# Patient Record
Sex: Male | Born: 1960 | Race: White | Hispanic: No | Smoking: Current every day smoker
Health system: Southern US, Community
[De-identification: ages and names within clinical notes are randomized; demographics above are authoritative.]

## PROBLEM LIST (undated history)

## (undated) DIAGNOSIS — I639 Cerebral infarction, unspecified: Secondary | ICD-10-CM

## (undated) DIAGNOSIS — Z87442 Personal history of urinary calculi: Secondary | ICD-10-CM

## (undated) DIAGNOSIS — F32A Depression, unspecified: Secondary | ICD-10-CM

## (undated) DIAGNOSIS — I10 Essential (primary) hypertension: Secondary | ICD-10-CM

## (undated) HISTORY — DX: Essential (primary) hypertension: I10

## (undated) HISTORY — DX: Depression, unspecified: F32.A

## (undated) HISTORY — PX: OTHER SURGICAL HISTORY: SHX169

## (undated) HISTORY — PX: APPENDECTOMY: SHX54

## (undated) HISTORY — PX: COLON SURGERY: SHX602

---

## 2004-03-05 ENCOUNTER — Other Ambulatory Visit: Payer: Self-pay

## 2005-10-03 ENCOUNTER — Emergency Department: Payer: Self-pay | Admitting: General Practice

## 2005-10-03 ENCOUNTER — Other Ambulatory Visit: Payer: Self-pay

## 2005-10-11 ENCOUNTER — Ambulatory Visit: Payer: Self-pay | Admitting: Family Medicine

## 2007-01-01 ENCOUNTER — Emergency Department: Payer: Self-pay | Admitting: Internal Medicine

## 2007-01-01 ENCOUNTER — Other Ambulatory Visit: Payer: Self-pay

## 2007-02-09 ENCOUNTER — Other Ambulatory Visit: Payer: Self-pay

## 2007-02-09 ENCOUNTER — Emergency Department: Payer: Self-pay | Admitting: General Practice

## 2007-09-02 ENCOUNTER — Other Ambulatory Visit: Payer: Self-pay

## 2007-09-02 ENCOUNTER — Emergency Department: Payer: Self-pay | Admitting: Emergency Medicine

## 2008-07-01 ENCOUNTER — Inpatient Hospital Stay: Payer: Self-pay | Admitting: Internal Medicine

## 2008-07-04 ENCOUNTER — Emergency Department: Payer: Self-pay | Admitting: Internal Medicine

## 2008-07-08 ENCOUNTER — Encounter: Payer: Self-pay | Admitting: Internal Medicine

## 2008-07-14 ENCOUNTER — Encounter: Payer: Self-pay | Admitting: Internal Medicine

## 2011-09-12 ENCOUNTER — Inpatient Hospital Stay: Payer: Self-pay | Admitting: Student

## 2011-09-12 LAB — SALICYLATE LEVEL: Salicylates, Serum: <1.7 mg/dL

## 2011-09-12 LAB — URINALYSIS, COMPLETE
Blood: NEGATIVE
Glucose,UR: NEGATIVE mg/dL (ref 0–75)
Leukocyte Esterase: NEGATIVE
Nitrite: NEGATIVE
Ph: 6 (ref 4.5–8.0)
Protein: NEGATIVE
Specific Gravity: 1.008 (ref 1.003–1.030)

## 2011-09-12 LAB — COMPREHENSIVE METABOLIC PANEL
Albumin: 3.7 g/dL (ref 3.4–5.0)
Alkaline Phosphatase: 77 U/L (ref 50–136)
Bilirubin,Total: 0.2 mg/dL (ref 0.2–1.0)
Creatinine: 1.14 mg/dL (ref 0.60–1.30)
EGFR (Non-African Amer.): >60
Glucose: 91 mg/dL (ref 65–99)
SGPT (ALT): 33 U/L
Sodium: 139 mmol/L (ref 136–145)
Total Protein: 7.2 g/dL (ref 6.4–8.2)

## 2011-09-12 LAB — CBC
HGB: 13.7 g/dL (ref 13.0–18.0)
MCH: 30.6 pg (ref 26.0–34.0)
MCHC: 32.9 g/dL (ref 32.0–36.0)
MCV: 93 fL (ref 80–100)
Platelet: 423 10*3/uL (ref 150–440)
RBC: 4.49 10*6/uL (ref 4.40–5.90)
RDW: 14.5 % (ref 11.5–14.5)

## 2011-09-12 LAB — DRUG SCREEN, URINE
Amphetamines, Ur Screen: NEGATIVE (ref ?–1000)
Barbiturates, Ur Screen: NEGATIVE (ref ?–200)
Benzodiazepine, Ur Scrn: NEGATIVE (ref ?–200)
Cocaine Metabolite,Ur ~~LOC~~: NEGATIVE (ref ?–300)
MDMA (Ecstasy)Ur Screen: NEGATIVE (ref ?–500)
Phencyclidine (PCP) Ur S: NEGATIVE (ref ?–25)
Tricyclic, Ur Screen: NEGATIVE (ref ?–1000)

## 2011-09-12 LAB — ETHANOL
Ethanol %: <0.003 % (ref 0.000–0.080)
Ethanol: <3 mg/dL

## 2011-09-12 LAB — TROPONIN I: Troponin-I: <0.02 ng/mL

## 2011-09-12 LAB — MAGNESIUM: Magnesium: 2.1 mg/dL

## 2011-09-12 LAB — TSH: Thyroid Stimulating Horm: 1.69 u[IU]/mL

## 2011-09-12 LAB — ACETAMINOPHEN LEVEL: Acetaminophen: 7 ug/mL — ABNORMAL LOW

## 2011-09-13 DIAGNOSIS — I517 Cardiomegaly: Secondary | ICD-10-CM

## 2011-09-13 LAB — CBC WITH DIFFERENTIAL/PLATELET
Basophil #: 0.2 10*3/uL — ABNORMAL HIGH (ref 0.0–0.1)
Basophil %: 2.1 %
Eosinophil #: 0.2 10*3/uL (ref 0.0–0.7)
HCT: 39.2 % — ABNORMAL LOW (ref 40.0–52.0)
MCH: 30.6 pg (ref 26.0–34.0)
MCHC: 33.2 g/dL (ref 32.0–36.0)
MCV: 92 fL (ref 80–100)
Monocyte #: 0.8 10*3/uL — ABNORMAL HIGH (ref 0.0–0.7)
Platelet: 400 10*3/uL (ref 150–440)
RDW: 14.8 % — ABNORMAL HIGH (ref 11.5–14.5)
WBC: 11.8 10*3/uL — ABNORMAL HIGH (ref 3.8–10.6)

## 2011-09-13 LAB — BASIC METABOLIC PANEL
BUN: 10 mg/dL (ref 7–18)
Chloride: 108 mmol/L — ABNORMAL HIGH (ref 98–107)
Co2: 23 mmol/L (ref 21–32)
Creatinine: 1.05 mg/dL (ref 0.60–1.30)
Glucose: 92 mg/dL (ref 65–99)
Osmolality: 284 (ref 275–301)
Potassium: 4.3 mmol/L (ref 3.5–5.1)

## 2011-09-13 LAB — CK TOTAL AND CKMB (NOT AT ARMC)
CK, Total: 35 U/L (ref 35–232)
CK-MB: <0.5 ng/mL — ABNORMAL LOW (ref 0.5–3.6)

## 2011-09-13 LAB — LIPID PANEL
HDL Cholesterol: 44 mg/dL (ref 40–60)
Ldl Cholesterol, Calc: 62 mg/dL (ref 0–100)
VLDL Cholesterol, Calc: 44 mg/dL — ABNORMAL HIGH (ref 5–40)

## 2011-09-13 LAB — TROPONIN I: Troponin-I: <0.02 ng/mL

## 2011-09-14 LAB — TSH: Thyroid Stimulating Horm: 0.572 u[IU]/mL

## 2011-09-18 LAB — CULTURE, BLOOD (SINGLE)

## 2011-09-19 LAB — CBC WITH DIFFERENTIAL/PLATELET
Eosinophil #: 0.1 10*3/uL (ref 0.0–0.7)
HCT: 42.9 % (ref 40.0–52.0)
Lymphocyte %: 28.3 %
MCH: 30.7 pg (ref 26.0–34.0)
MCHC: 32.7 g/dL (ref 32.0–36.0)
MCV: 94 fL (ref 80–100)
Monocyte %: 9.5 %
Neutrophil #: 5.8 10*3/uL (ref 1.4–6.5)
Neutrophil %: 60.1 %
Platelet: 516 10*3/uL — ABNORMAL HIGH (ref 150–440)
RDW: 15.4 % — ABNORMAL HIGH (ref 11.5–14.5)
WBC: 9.6 10*3/uL (ref 3.8–10.6)

## 2011-09-19 LAB — BASIC METABOLIC PANEL
BUN: 16 mg/dL (ref 7–18)
Calcium, Total: 9 mg/dL (ref 8.5–10.1)
Creatinine: 1.26 mg/dL (ref 0.60–1.30)
EGFR (African American): >60
EGFR (Non-African Amer.): >60
Glucose: 94 mg/dL (ref 65–99)
Osmolality: 280 (ref 275–301)
Potassium: 3.9 mmol/L (ref 3.5–5.1)

## 2011-09-21 ENCOUNTER — Emergency Department: Payer: Self-pay | Admitting: Emergency Medicine

## 2011-09-21 LAB — DRUG SCREEN, URINE
Amphetamines, Ur Screen: NEGATIVE (ref ?–1000)
Benzodiazepine, Ur Scrn: NEGATIVE (ref ?–200)
Cannabinoid 50 Ng, Ur ~~LOC~~: NEGATIVE (ref ?–50)
Methadone, Ur Screen: NEGATIVE (ref ?–300)
Opiate, Ur Screen: NEGATIVE (ref ?–300)
Phencyclidine (PCP) Ur S: NEGATIVE (ref ?–25)

## 2011-09-21 LAB — CBC
HCT: 44.2 % (ref 40.0–52.0)
MCHC: 33.1 g/dL (ref 32.0–36.0)
MCV: 93 fL (ref 80–100)
Platelet: 514 10*3/uL — ABNORMAL HIGH (ref 150–440)
RBC: 4.77 10*6/uL (ref 4.40–5.90)
RDW: 15.1 % — ABNORMAL HIGH (ref 11.5–14.5)
WBC: 9.1 10*3/uL (ref 3.8–10.6)

## 2011-09-21 LAB — PROTIME-INR
INR: 0.8
Prothrombin Time: 11.4 secs — ABNORMAL LOW (ref 11.5–14.7)

## 2011-09-21 LAB — COMPREHENSIVE METABOLIC PANEL
Albumin: 3.9 g/dL (ref 3.4–5.0)
BUN: 13 mg/dL (ref 7–18)
Calcium, Total: 9.1 mg/dL (ref 8.5–10.1)
Chloride: 106 mmol/L (ref 98–107)
EGFR (African American): >60
Glucose: 95 mg/dL (ref 65–99)
SGOT(AST): 20 U/L (ref 15–37)
SGPT (ALT): 24 U/L
Total Protein: 7.2 g/dL (ref 6.4–8.2)

## 2011-09-21 LAB — APTT: Activated PTT: 28.5 secs (ref 23.6–35.9)

## 2011-09-21 LAB — TSH: Thyroid Stimulating Horm: 0.89 u[IU]/mL

## 2011-09-21 LAB — SALICYLATE LEVEL: Salicylates, Serum: <1.7 mg/dL

## 2011-09-21 LAB — ETHANOL
Ethanol %: <0.003 % (ref 0.000–0.080)
Ethanol: <3 mg/dL

## 2011-09-21 LAB — ACETAMINOPHEN LEVEL: Acetaminophen: <2 ug/mL

## 2013-01-22 ENCOUNTER — Ambulatory Visit: Payer: Self-pay | Admitting: Neurology

## 2013-02-19 ENCOUNTER — Encounter: Payer: Self-pay | Admitting: Neurology

## 2013-03-14 ENCOUNTER — Encounter: Payer: Self-pay | Admitting: Neurology

## 2013-04-14 ENCOUNTER — Encounter: Payer: Self-pay | Admitting: Neurology

## 2013-05-14 ENCOUNTER — Encounter: Payer: Self-pay | Admitting: Neurology

## 2013-06-14 ENCOUNTER — Encounter: Payer: Self-pay | Admitting: Neurology

## 2013-06-26 ENCOUNTER — Ambulatory Visit: Payer: Self-pay | Admitting: Otolaryngology

## 2013-07-14 ENCOUNTER — Encounter: Payer: Self-pay | Admitting: Neurology

## 2013-08-14 ENCOUNTER — Encounter: Payer: Self-pay | Admitting: Neurology

## 2014-11-03 DIAGNOSIS — I10 Essential (primary) hypertension: Secondary | ICD-10-CM | POA: Diagnosis not present

## 2014-11-03 DIAGNOSIS — I639 Cerebral infarction, unspecified: Secondary | ICD-10-CM | POA: Diagnosis not present

## 2014-11-03 DIAGNOSIS — E785 Hyperlipidemia, unspecified: Secondary | ICD-10-CM | POA: Diagnosis not present

## 2014-11-03 DIAGNOSIS — F341 Dysthymic disorder: Secondary | ICD-10-CM | POA: Diagnosis not present

## 2014-11-03 DIAGNOSIS — E119 Type 2 diabetes mellitus without complications: Secondary | ICD-10-CM | POA: Diagnosis not present

## 2014-11-03 LAB — PSA

## 2014-12-06 NOTE — Consult Note (Signed)
PATIENT NAME:  [name] [name] DXENG, KUCHERATE OF BIRTH:  [birth year]  DATE OF CONSULTATION:  09/13/2011  REFERRING PHYSICIAN:   Dr. Cherylann Ratel CONSULTING PHYSICIAN:  Ollen Gross. Willeen Cass, MD  REASON FOR CONSULTATION: Thyroid nodule.   HISTORY OF PRESENT ILLNESS: This 54 year old male was admitted to the hospital with acute mental status change. He has a history of hypertension, substance abuse, and prior stroke. The patient is unable to give any history. It is unclear whether he had any known history of thyroid disease. It is unclear whether he has had any issues of difficulty swallowing. A thyroid nodule was discovered as an incidental finding during a carotid evaluation. He was noted to have a 2-cm mass in the left lobe of the thyroid gland.   PAST MEDICAL HISTORY:  1. Hypertension.  2. Hospitalization in 2009 for syncope, at which time he was found to have a cerebrovascular accident. Apparently there was some question as to whether these findings could have represented demyelinating disease versus vasculitis. However, neurology felt it was likely prior ischemia.  3. History of polysubstance abuse, history of cocaine abuse.  4. History of bowel obstruction.   ALLERGIES: Codeine.   SOCIAL HISTORY: Unobtainable from the patient.   REVIEW OF SYSTEMS: Unobtainable from the patient. He remains confused.   FAMILY HISTORY: Unobtainable from the patient.  MEDICATIONS:  1. Aspirin 325 mg p.o. daily.  2. Lovenox 40 mg subcutaneous daily.  3. Labetalol 10 mg IV push p.r.n. hypertension.  4. Zofran 4 mg IV push every four hours p.r.n. nausea. 5. Levaquin 750 mg IV q. 24 hours.   PHYSICAL EXAMINATION:  VITAL SIGNS: Temperature 98.7, pulse 63, blood pressure 156/107.   GENERAL: Thin male, clearly confused, unable to answer questions clearly, lying in bed, although he is in no acute distress.   HEAD AND FACE: Head is normocephalic, atraumatic. No facial skin lesions. Facial strength appears to  be normal and symmetric.   EARS: External ears, ear canals, tympanic membranes are clear bilaterally. There is no middle ear effusion or infection.   NOSE: External nose unremarkable. Nasal cavity is clear. No purulence or polyps are seen. The septum is straight.   ORAL CAVITY AND OROPHARYNX: Teeth, lips, and gums unremarkable. Tongue and floor of the mouth without lesions. Posterior pharynx is clear.   NECK: The neck is supple without adenopathy or mass. Salivary glands are soft and nontender. I cannot appreciate the thyroid mass on exam. There may be some fullness in the left lobe of the thyroid gland but I cannot feel a discrete mass.   DATA REVIEW: TSH level was normal at 1.69.   ASSESSMENT: This patient has been admitted to the hospital for acute mental status changes and is in the process of being worked up for that more acute issue. A 2-cm thyroid mass was found as an incidental finding during his carotid evaluation.  It is unclear if he has had any previous evaluation for this mass by history as the patient himself is not able to give any history. Clearly his current medical issues are more important than the evaluation of the thyroid nodule. The evaluation of the thyroid nodule could be followed up on as an outpatient. Right now they have him on anticoagulant therapy which would preclude the next diagnostic evaluation, a fine needle aspirate of the thyroid nodule. I am happy to see this patient in followup. If he is not going to be sent home on anticoagulant therapy, it would be very helpful  if Dr. Cherylann RatelLateef could schedule the patient for an ultrasound-guided fine needle aspiration of the left thyroid nodule as an outpatient after his discharge when he has been off of anticoagulant therapy. He would need to be off of aspirin for a week for the radiologist to schedule this. Otherwise, he can follow up with me as an outpatient after discharge and we could set this up. No other intervention is  necessary at this moment, however. I will sign off and expect to see him as an outpatient.    ____________________________ Ollen GrossPaul S. Willeen CassBennett, MD psb:bjt D: 09/13/2011 17:49:15 ET T: 09/14/2011 08:19:28 ET JOB#: [id number]  cc: Ollen GrossPaul S. Willeen CassBennett, MD, <Dictator> Sandi MealyPAUL S Ladashia Demarinis MD ELECTRONICALLY SIGNED 09/27/2011 7:26

## 2014-12-06 NOTE — Consult Note (Signed)
PATIENT NAME:  [name] [name], [name] [name] MR#:  [id number] DATE OF BIRTH:  Nov 14, 1960  DATE OF CONSULTATION:  09/13/2011  REFERRING PHYSICIAN:  Dr. Cherylann RatelLateef  CONSULTING PHYSICIAN:  Rose PhiPeter R. Kemper Durielarke, MD  HISTORY: Mr. [name] [name] is a 54 year old right-handed white patient of Dr. Vonita MossMark Crissman with history of hypertension, polysubstance abuse, admission February 2007 for seizure in the setting of cocaine use and no hypertensive medication for three days, and admission November 2009 with syncope and small right ventricular nucleus lacunar stroke in the setting of cocaine use and not taking hypertensive medication. He is now admitted 09/12/2011 and is referred for evaluation of altered mental status. History comes from the patient, his hospital chart, and his hospital records.   The patient was brought to the Emergency Room at 5:30 p.m. on 09/12/2011 with history obtained from the admitting physician and from the patient's mother that he was fine until late afternoon when he returned from being out half an hour, he was confused and staggering. Initial vital signs in the Emergency Room included blood pressure 178/118, heart rate 92, respirations 26, oxygen saturation 98%. Urine drug screen was positive for cannabinoids. Brain CT scan showed no acute changes. Brain MRI scan today shows acute nonhemorrhagic stroke of left frontoparietal region. Admitting physician learned by telephone from the patient's mother that the patient had not been taking his medication for hypertension recently.   PHYSICAL EXAMINATION: The patient is a well developed, slender white gentleman who is examined lying semisupine, in no apparent distress. Blood pressure 175/110, heart rate 80; there is no fever. He was normocephalic without evidence of trauma. His neck was supple. He was alert and visually attending with conjugate gaze to each side, without spontaneous speech and with moderate to severe expressive aphasia and more than moderate receptive  difficulty following commands. He was not able to repeat words or name objects. He was occasionally able to utter partial phrase. Cranial nerve examination was grossly normal with symmetric facial appearance, normal eye movements, and visual acuity grossly intact to visual threat for each eye. Motor examination of the extremities was limited but was grossly normal. He had no abnormal spontaneous movements and no gross dystaxia of observed upper extremity movements. Reflexes were bilaterally 2+ upper extremities and 3+ lower extremities. His gait was not tested.   IMPRESSION: Expressive and receptive aphasia associated with MRI scan evidence of new area of nonhemorrhagic infarction of the left hemisphere, in the setting of significant elevation of blood pressure in a patient with history of poor compliance with antihypertensive medication regimen.   RECOMMENDATIONS:  1. I agree with his present work-up and treatment in hospital including imaging and laboratory studies and therapist referrals.  2. The patient is strongly encouraged to begin taking medications faithfully as prescribed.   I appreciate being asked to see this interesting gentleman.    ____________________________ Rose PhiPeter R. Kemper Durielarke, MD prc:drc [name]: 09/14/2011 09:05:00 ET T: 09/14/2011 10:08:12 ET JOB#: [id number]  cc: Rose PhiPeter R. Kemper Durielarke, MD, <Dictator> Steele SizerMark A. Crissman, MD Gaspar GarbePETER R Garhett Bernhard MD ELECTRONICALLY SIGNED 09/15/2011 14:41
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
## 2014-12-06 NOTE — Discharge Summary (Signed)
PATIENT NAME:  [name] [name], [name] [name] MR#:  [id number] DATE OF BIRTH:  Apr 04, 1961  DATE OF ADMISSION:  09/12/2011 DATE OF DISCHARGE:  09/19/2011  CHIEF COMPLAINT: Altered mental status, confusion.   CONSULTANTS:  1. Physical Therapy. 2. Occupational Therapy.  3. Suzan SlickPeter Clarke, MD - Neurology. 4. Mordecai RasmussenJohn Clapacs, MD - Psychiatry.  5. Geanie LoganPaul Bennett, MD - ENT.  DISCHARGE DIAGNOSES:  1. Stroke with receptive expressive aphasia. 2. Uncontrolled hypertension. 3. History of polysubstance abuse. 4. Medication noncompliance.  5. Pneumonia. 6. Left thyroid mass. 7. Post-stroke delirium.   DISCHARGE MEDICATIONS:  1. Lisinopril 20 mg daily.  2. Metoprolol 50 mg twice a day. 3. Aspirin 325 mg daily until you fill Aggrenox. At that time, please stop aspirin.  4. Aggrenox 1 tab p.o. twice a day.  DIET: Low sodium, low fat diet.   ACTIVITY: As tolerated.   REFERRALS: Outpatient occupational therapy, speech therapy.   DISCHARGE FOLLOWUP: Please follow-up with your PCP within 1 to 2 weeks for blood pressure check and further evaluation for the left-sided thyroid mass. Please follow-up with Dr. Willeen CassBennett, ENT, for left-sided thyroid mass evaluation and possibly biopsy scheduling.   HISTORY OF PRESENT ILLNESS: Please see the full history and physical dictated on 09/12/2011 by Dr. Cherylann RatelLateef, but briefly this is a 54 year old Caucasian male with history of CVA in the past and history of polysubstance abuse who presented with altered mental status and was found to have elevated blood pressure and urine toxicology positive for THC. He was admitted to the hospitalist service for further evaluation and management. The patient had been noncompliant with his medications.   SIGNIFICANT LABS/IMAGING: Creatinine on arrival was 1.14 and sodium 139. Urine toxicology was positive for cannabis. LDL 62. LFTs within normal limits. Troponins negative x3. TSH 1.69 initially then 0.572. Free thyroxine 0.98. Initial WBC 10.5.    Blood cultures on arrival: No growth to date x2.   Urinalysis not suggestive of infection.   Initial pH on ABG was 7.39, pCO2 37, and pO2 77.   Echocardiogram showed no thrombus or vegetation noted. Ejection fraction is greater than or equal to 55%. Possible diastolic dysfunction. Mild concentric left ventricular hypertrophy.   Initial CT of the head without contrast showed findings suggestive of multifocal areas of previous infarct.  X-ray of the chest, one view, showed bibasilar atelectasis versus pneumonia.   MRI of the brain without contrast showed findings concerning for embolic disease within the right MCA distribution. Chronic areas of lacunar infarct in the right cerebellar hemisphere.   Ultrasound of the carotids showed no significant stenosis.  Ultrasound of thyroid showed lower pole of thyroid with solid mass without definitive evidence of malignancy. Punctate calcification within the right mid right thyroid lobe.   HOSPITAL COURSE: The patient was admitted to the hospitalist service. Further evaluation including MRI of the brain was done and the patient was also seen by Dr. Kemper Durielarke from Neurology. The MRI did show evidence for an acute stroke. The patient did have expressive and receptive aphasia. The patient is noncompliant with his medications and on arrival had elevated blood pressure. He was started on aspirin, beta blocker, and his lisinopril was continued. LDL came back within goal and he has not been started on a statin. He was seen by physical therapy and occupational therapy. The hospitalization was complicated with post-stroke delirium and he was seen by psychiatry, Dr. Toni Amendlapacs. He was started on p.r.n. Haldol, Ativan, and Geodon. Urine toxicology came back positive for cannabinoids. The patient has history of  polysubstance abuse including tobacco, alcohol, and cocaine as well. He is ambulating and his deficits are aphasia, mostly. Per physical therapy, he can go home with  outpatient OT and speech therapy. This will be scheduled for the patient. While here he was started on aspirin, however, he will go with aspirin as well as Aggrenox prescription. The family states that he cannot afford Aggrenox, however, I will provide a prescription and once his insurance kicks in they can fill this and until then they can take the full dose aspirin. His blood pressure has significantly improved with the ACE inhibitor and metoprolol.   While hospitalized he did have an x-ray of the chest which was suggestive of atelectasis versus pneumonia. He was started on Levaquin and he has had seven days of this. He had no significant leukocytosis, hypoxia, or fevers. While undergoing evaluation for his stroke, he did have an ultrasound of the carotids which did not show any evidence for significant stenosis. However, a left thyroid mass was seen, which was an incidental finding.  A left side thyroid dedicated ultrasound was obtained which did pick up the mass as well and ENT physician, Dr. Willeen Cass, was consulted who saw the patient and is happy to see the patient as an outpatient for possible biopsy. I discussed this with the patient's family and they will see him as an outpatient. At this point, outpatient services have been set up for the patient and he will be going home with that.   TOTAL TIME SPENT: 35 minutes. ____________________________ Krystal Eaton, MD sa:slb [name]: 09/19/2011 14:11:17 ET    T: 09/19/2011 14:44:20 ET       JOB#: [id number]  cc: Krystal Eaton, MD, <Dictator> Steele Sizer, MD Krystal Eaton MD ELECTRONICALLY SIGNED 10/06/2011 15:25

## 2014-12-06 NOTE — Consult Note (Signed)
Details:    - Psychiatry: Rounded on pt today. He was awake and alert. Mother was in the room. PAtient interacted with me but his aphasia today was similar to when I last saw him. He could not speak his name, could not name simple objects. Given a pen he fumbled with it for a while but finally dis write some numbers on a piece of paper but no words. Seemed to understand the question when I asked him to name his mother, but called her "daddy" several times. I understand that yesterday he was better had was producing some inteligenble speach. Today mother  tells me he has not been able to speak the whole time she has been there. She confirms also that patient does not drink alcohol and has no prior history of mental illness.  Aphasia Post-stroke delirium  No new psychiatry treatment. Continue use of prn im geodon or iv haldol for delirium if needed.   Electronic Signatures: Audery Amellapacs, Sherald Balbuena T (MD)  (Signed 03-Feb-13 18:23)  Authored: Details   Last Updated: 03-Feb-13 18:23 by Audery Amellapacs, Curran Lenderman T (MD)

## 2014-12-06 NOTE — Consult Note (Signed)
Details:    - Psychiatry: Patient seen today.He was awake in bed and calm. Interected much as yesterday. In my brief encounter today I could not detect much change from yesterday in his aphasia.  His delirium seems to have settled down with great improvement in behavior. You could probably safely discontinue the sitter if you wish, but I will leave that to the discretion of the ward physician since I do not know what the 24hr nursing opinion is.   Electronic Signatures: Audery Amellapacs, Candler Ginsberg T (MD)  (Signed 04-Feb-13 13:02)  Authored: Details   Last Updated: 04-Feb-13 13:02 by Audery Amellapacs, Timmothy Baranowski T (MD)

## 2014-12-06 NOTE — Consult Note (Signed)
Details:    - Psychiatry: Patient reevaluated. [facility] PAtient has had nno new problems. He is alert and more active. He has been able to be up and mobile but has not been aggressive or agitated. Got information from family that patient had been sent home after stroke rather than to rehab. He had some disorganized behavior but not aggressive or dangerous.   Patient does not have a psychiatric problem. He has possibly some behavior problems due to his acute neurologic problem. Patient does not need ongoing psychiaric medication.He has not needed any antipsychotic or psych meds in the ER.   Family feels they can't manage him at home.  He needs to go to a rehab facility.  I suggest the ER continue to try to work with hospital resourses to get him the services he needs.   Electronic Signatures: Audery Amellapacs, Sybrina Laning T (MD)  (Signed 08-Feb-13 15:43)  Authored: Details   Last Updated: 08-Feb-13 15:43 by Audery Amellapacs, Ramiro Pangilinan T (MD)

## 2014-12-06 NOTE — Consult Note (Signed)
PATIENT NAME:  [name] [name], [name] [name] MR#:  [id number] DATE OF BIRTH:  February 16, 1961  DATE OF CONSULTATION:  09/14/2011  REFERRING PHYSICIAN:   CONSULTING PHYSICIAN:  Audery AmelJohn T. Cheston Coury, MD  IDENTIFYING INFORMATION AND REASON FOR CONSULT: This is a 54 year old gentleman who came into the Emergency Room with an acute mental status change. Consult was for mental status change and possibility of substance abuse because he has a drug screen positive for marijuana.   HISTORY OF PRESENT ILLNESS: Information was obtained from the chart and some information from an interview with the patient. The patient was evidently brought into the hospital yesterday with a history of an acute dramatic mental status change. His mother reported that he was away from her for a short period of time and when she next saw him he was unable to speak or communicate. The mother allegedly had reported that she did not know of any substance abuse that he had recently been engaged in. The patient was worked up and an MRI shows a new area of infarct. He has been seen by the neurology consultant who has identified this as well. I tried to interview the patient and was not able to actually get any history. The patient has a rather severe aphasia, certainly expressive, probably receptive. He is unable to communicate in any really meaningful manner. The patient was in fact unable to tell me his name. He was unable to name any of the simple objects that I showed him. He was able to say the word yes but he did not use it consistently in a correct manner. I am not sure if this was because he was not understanding the question or is not able to correctly articulate yes or no or if there is a greater underlying dementia. He was not able to follow simple directions. I tried to get him to communicate using a yes or no hand code but he was not available to understand what I wanted. He does retain the use of a few words. He is able to say the words yes and no. He  said "okay bud" to me several times after I had finished speaking to him. When I waved to him and left he said thanks. Otherwise, unable to give any history.   PAST PSYCHIATRIC HISTORY: Apparently he has had a history of substance abuse problems in the past.   REVIEW OF SYSTEMS: The patient is not able to offer any complaints at this time.   MENTAL STATUS EXAM: See above. He was alert and awake and interactive with me, although not in a way that was communicating. He made fairly good eye contact at times. He was not agitated or hostile. His affect looked like he was puzzled and troubled at not being able to speak, although he did not get angry or agitated. He was not tearful. He was not able to answer any historical questions even with a consistent yes or no. As an example, I asked him initially whether 2+2 equaled 4 and he answered no. I asked him if his name was [name] and he answered no. I asked him about his birth date and he answered no. Later I asked him the same questions and he answered yes to one of them and still no to the others. It was very inconsistent. I asked him to show me how he used a drinking cup and he was able to do that correctly. I handed him the remote control to the television. He pointed it towards  the TV and seemed to know what it was supposed to do but he was unable to actually find the right button or make it do anything.   LABORATORY, DIAGNOSTIC, AND RADIOLOGICAL DATA: See the MRI scan results for the description of the new findings. His drug screen was positive for cannabis, otherwise negative.   ASSESSMENT: This is a 54 year old man who presents with an acute mental status change or more specifically an acute aphasia. Apparently he was more sedated and confused yesterday, but today he is awake. As detailed above he has a very significant aphasia and is unable in the brief time I spent with him to communicate at all. This is seen to be clearly related to his new stroke.  Cannabis is not likely to have played a significant role in this. His drug screen isn't positive for amphetamines or cocaine, either of which could have contributed to his stroke. Therefore, I do not think that his substance abuse is an acutely relevant issue. In any case, he would certainly not be able to participate in any kind of treatment for substance abuse at this point.   TREATMENT PLAN: No psychiatric treatment indicated. I will follow up with him tomorrow to make sure things are about the same. If there is any new concern that I have somehow overlooked, please let me know. Otherwise I will probably sign off.   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   SECONDARY DIAGNOSES:  AXIS I: Marijuana abuse.   AXIS II: No diagnosis.   AXIS III: Acute stroke.   AXIS IV: Severe from this current new severe disability.   AXIS V: Functioning at time of evaluation 30.   ____________________________ Audery Amel, MD [id number] [name]: 09/14/2011 16:36:37 ET T: 09/15/2011 11:22:07 ET JOB#: [id number]  cc: Audery Amel, MD, <Dictator> Audery Amel MD ELECTRONICALLY SIGNED 09/15/2011 15:32

## 2014-12-06 NOTE — H&P (Signed)
PATIENT NAME:  [name] [name], [name] [name] MR#:  [id number] DATE OF BIRTH:  [birth year]  DATE OF ADMISSION:  09/12/2011  REFERRING PHYSICIAN: Dr. Renard Hamper   PRIMARY CARE PHYSICIAN: Dr. Jeananne Rama    REASON FOR ADMISSION: Altered mental status, confusion.  HISTORY OF PRESENT ILLNESS: The patient is a 54 year old male with past medical history listed below including hypertension, substance abuse, and cerebrovascular accident who was brought to the ER secondary to altered mental status and confusion. The patient is currently answering questions inappropriately and mostly only moaning and answering "yes" to most everything that myself and the nurses have asked him as well as the ER physician and he is unable to provide any history or review of systems at this time. He is not following commands. History was obtained from speaking with the ER physician, from speaking with the nursing staff in the ER, and also speaking with the patient's mother over the phone. Per the patient's mother, the patient was in his usual state of health today. The patient had breakfast with his mother and later on both the patient and his mother were watching TV and chatting. He seemed to be in his usual state of health. Later in the afternoon the patient went outside and initially the patient's mother did not know where he went. He was gone for approximately half an hour. When he returned, he stated that he was across the street at his neighbor's house speaking with his neighbor. He thereafter went into his room. A short while later he returned from his room in a confused state and was staggering and had an unsteady gait and was falling per his mother and was acting very confused and disoriented and was not "acting himself". Thereafter he was brought to the ER for further evaluation. Family members are currently not present and I spoke to his mother over the phone. For his hypertension, the patient has not been taking his blood pressure medications  regularly per his mother. In the ER a noncontrast head CT was obtained which was negative for acute intracranial abnormalities. Urine drug screen was positive for THC. Otherwise, initial labs were nonrevealing. Hospitalist services were contacted for further evaluation and for hospital admission. Per the patient's mother, exact timing in terms of the patient's symptom onset is unclear, however, in regards to duration he remains confused but is no longer as aggressive but he persistently has altered mental status and as above at home he was staggering and also noted to have experienced a fall.   PAST SURGICAL HISTORY: Multiple abdominal surgeries at [facility].   PAST MEDICAL HISTORY:  1. Hypertension.  2. Hospitalization November 2009 for syncope. The patient was found to have acute right lenticular nucleus nonhemorrhagic CVA.  3. History of CVA noted on MRI November 2009 as above (on previous MRI the findings were concerning for vasculitis versus demyelinating disease and the patient was seen by Neurology and MRI findings were felt to be due to drug abuse and possibly old ischemia per Neurology evaluation rather than vasculitis or demyelinating disease).  4. History of polysubstance abuse. 5. History of cocaine abuse.   6. History of bowel obstructions.   ALLERGIES: Codeine per old records.   HOME MEDICATIONS: Not sure exactly what home medications the patient is taking. He is not taking his blood pressure pills per his mother. Per most recent discharge summary from 07/04/2008, he was supposed to have been on the following medications which need to be verified and I'm not sure if he's taking these  agents. 1. Aspirin 325 mg daily. 2. Lipitor 20 mg daily. 3. Lisinopril/HCTZ 20/12.5 one tablet p.o. daily.   FAMILY HISTORY: Unobtainable from the patient.   SOCIAL HISTORY: Unobtainable from the patient. The patient's mother does not think he has been using any illicit substances.   REVIEW OF SYSTEMS:  Unobtainable from the patient as he is confused, does not answer questions appropriately, and has altered mental status.   PHYSICAL EXAMINATION:   VITAL SIGNS: Temperature 98.6, pulse 86, blood pressure 174/112, respirations 20, oxygen sat 96% on room air.   GENERAL: The patient is alert and awake. He is moving all of his extremities spontaneously. He appears confused but not acutely distressed.   HEENT: Normocephalic, atraumatic. Pupils equal, round, and reactive to light and accommodation. Cannot assess his extraocular muscle function as he does not follow commands. Anicteric sclerae. Conjunctivae pink. Nares without drainage. Oral mucosa is moist but difficult to fully inspect the patient's oropharynx as he does not follow commands and keep his mouth open long enough for me to examine.   NECK: Supple. No JVD, lymphadenopathy, or carotid bruits bilaterally. No thyromegaly or tenderness to palpation over the thyroid gland.  CHEST: Normal respiratory effort without use of accessory respiratory muscles. Lungs are clear to auscultation bilaterally without crackles, rales, or wheezes.   CARDIOVASCULAR: S1, S2 positive. Regular rate and rhythm. No murmurs, rubs, or gallops. PMI is non-lateralized.   ABDOMEN: Soft, nontender, nondistended. Normoactive bowel sounds. No hepatosplenomegaly or palpable masses. No hernia. The patient does not wince with deep palpation of his abdomen.   EXTREMITIES: No clubbing, cyanosis, or edema. Pedal pulses are palpable bilaterally.   SKIN: No suspicious rash.   LYMPH: No cervical lymphadenopathy.   NEUROLOGICAL: Limited exam as the patient does not follow commands. He is alert and awake but does not respond appropriately to questions and appears disoriented. He is moving all of his extremities spontaneously but when I ask him to squeeze my hands or lift his legs he does not follow commands. Patellar deep tendon reflexes are 2+. The patient did not perform  finger-to-nose testing when asked and not following commands as above. No facial droop. He is mostly moaning and earlier when asked if he knew where he was he responded "15".   PSYCH: Cannot assess his affect currently as he is confused.   LABS/STUDIES: CBC within normal limits. CMP within normal limits. Troponin less than 0.02. Urinalysis benign. Urine drug screen positive for cannabinoids. Noncontrast head CT findings suggestive of multifocal areas of previous infarcts. Other possibilities include demyelination as suggested on previous MRI. No acute intracranial abnormalities evident.   EKG normal sinus rhythm, 84 beats per minute with normal axis. No acute ST or T wave changes.   ASSESSMENT AND PLAN: This is a 54 year old male with history of hypertension, history of substance abuse including cocaine, history of CVA in 2009 as well as abnormal brain MRI here with one day history of altered mental status and confusion with staggering gait and also fall with:  1. Altered mental status/confusion. Exact etiology is unclear at this time and there is a broad differential diagnosis. Not sure if this represents a recurrent CVA versus possible TIA. This could also be possible medication withdrawal versus abuse, although would not expect to see this from [facility] abuse as his urine drug screen is only positive for THC. Differential diagnosis would also include hypertensive encephalopathy as the patient was noted to have diastolic hypertension in addition to systolic hypertension. Recommend hospital admission  for further evaluation and management. Will check an ABG. Will obtain frequent neuro checks. Schedule brain MRI, carotid artery Doppler ultrasound, 2-D echocardiogram, and monitor patient on telemetry. Also check ESR. Will continue aspirin therapy for now. Also restratify by checking fasting lipid profile in a.m. Obtain Neurology consultation for further recommendations. Also get PT, OT and speech therapy  evaluation. Keep patient n.p.o. except for medications for now and if the patient is unable to swallow pills will give aspirin rectally. Otherwise, we will provide supportive care, close monitoring, and keep the patient on IV fluids while he is n.p.o. He is currently afebrile and without leukocytosis and infectious etiology of his presentation is felt to be less likely at this time. However, blood cultures were drawn in the ER and will be followed. The patient was seen by Neurology during his last admission in 2009 due to his abnormal brain MRI which was suggestive of possible vasculitis or demyelinating process (which was also noted on today's head CT) and Neurology felt that changes on the MRI were related to drug abuse and possibly old ischemia rather than vasculitis or demyelinating disease per review of the most recent discharge summary. Will obtain repeat MRI at this time given the patient's presentation as well as Neurology consultation and await further recommendations. Plan of care and further work-up and management were discussed with the patient's mother who is currently in agreement with all of the above and all of her questions were answered.  2. Malignant hypertension. The patient's diastolic blood pressure is quite high. This is likely due to medication noncompliance as his mother tells me that he's not been taking his blood pressure pills at home regularly. For will not lower the patient's blood pressure too rapidly or aggressively in the setting of possible CVA. Will write for p.r.n. IV labetalol in the event of severe hypertension and he received a dose of IV labetalol in the ER for uncontrolled hypertension. Will monitor blood pressure closely.  3. History of CVA. Continue aspirin for now. He was also taking statin in the past but currently it appears he is no longer taking this agent. Will check fasting lipid profile in a.m.   4. History of substance abuse. As above, urine drug screen is  positive for cannabinoids. The patient also has a history of cocaine abuse but at this time urine drug screen is negative for cocaine and also negative for all opiates, benzos, barbiturates, phencyclidine, MDMA, amphetamines, or tricyclic antidepressants. Cannot counsel the patient on THC abuse at this time as he is confused.  5. DVT prophylaxis. Lovenox.   CODE STATUS: FULL CODE.   DISPOSITION: Plan of care including hospital admission, further evaluation and management, and entire plan was discussed with the patient's mother verbally over the phone who is currently in agreement with all of the above.   TIME SPENT ON THIS ADMISSION: Approximately 60 minutes.  ____________________________ Romie Jumper, MD knl:drc D: 09/12/2011 23:13:46 ET T: 09/13/2011 06:54:01 ET JOB#: [id number]  cc: Romie Jumper, MD, <Dictator> Guadalupe Maple, MD Romie Jumper MD ELECTRONICALLY SIGNED 10/01/2011 3:25

## 2014-12-06 NOTE — Consult Note (Signed)
Brief Consult Note: Diagnosis: dementia of as yet unknown extent due to a stroke.   Patient was seen by consultant.   Consult note dictated.   Comments: Psychiatry: Patient seen and chart reviewed. Patient presented with altered mental status. MRI shows a new stroke on the left side. On exam today he is awake and alert but has expressive and prob. receptive aphasia that are quite extentive. He is unable to say his name and unable to name simple objects. He can not reliably answer yes or no questions. He retains some words and used a couple appropriately (said "Thanks" when I left and "ok bud" when I spoke to him) but couldn't really communicate even with gesture. He still retains some basic ability to use object like the drinking cup, and seemed to know what the TV remote was for, but couldn't use it. He seems to have at least some awareness of his deficits. DX: Aphasia. Stroke. Doubt acute SA had anything to do with it since he isn't positive for cocaine or amphetamine. Anyway there is obviously no way to do psychotherapy for SA even if he did need it.  I suggest speech therapy see him if they aren't already. Will check in tomorrow but then prob sign off.  Electronic Signatures: Audery Amellapacs, John T (MD)  (Signed 31-Jan-13 12:40)  Authored: Brief Consult Note   Last Updated: 31-Jan-13 12:40 by Audery Amellapacs, John T (MD)

## 2014-12-06 NOTE — Consult Note (Signed)
Brief Consult Note: Diagnosis: Possible post-stroke delirium.   Patient was seen by consultant.   Consult note dictated.   Orders entered.   Comments: Psychiatry: Patient seen. Patient known to me from his recent inpatient stay. Current circumstances unclear. Pt can't give history and the phone # in the chart has been changed to an unlisted number.  Patient recently had a large L sided stroke with severe aphasia. On exam today his aphasia is a little improved but still severe to the point of it being nearly impossible for him to communicate with language. He is otherwise calm.  Unclear recent symptoms. Currently no behavior problem. Need to get more info from family and observe. Ordered prn im geodon in case it is needed.  Electronic Signatures: Audery Amellapacs, Fusae Florio T (MD)  (Signed 07-Feb-13 16:17)  Authored: Brief Consult Note   Last Updated: 07-Feb-13 16:17 by Audery Amellapacs, Jonalyn Sedlak T (MD)

## 2015-01-25 ENCOUNTER — Telehealth: Payer: Self-pay | Admitting: Unknown Physician Specialty

## 2015-01-25 MED ORDER — LISINOPRIL 20 MG PO TABS: 20.0000 mg | ORAL_TABLET | Freq: Every morning | ORAL | Status: DC

## 2015-01-25 NOTE — Telephone Encounter (Signed)
[facility] in [place] called stated pt needs refill on the following:  Lisinopril  Thanks.

## 2015-02-16 DIAGNOSIS — G3184 Mild cognitive impairment, so stated: Secondary | ICD-10-CM | POA: Diagnosis not present

## 2015-02-16 DIAGNOSIS — I639 Cerebral infarction, unspecified: Secondary | ICD-10-CM | POA: Diagnosis not present

## 2015-03-05 ENCOUNTER — Other Ambulatory Visit: Payer: Self-pay | Admitting: Unknown Physician Specialty

## 2015-03-05 MED ORDER — METOPROLOL TARTRATE 50 MG PO TABS: 50.0000 mg | ORAL_TABLET | Freq: Two times a day (BID) | ORAL | Status: DC

## 2015-03-05 NOTE — Telephone Encounter (Signed)
E-fax came through for refill on: Rx: Metoprolol Copy in basket

## 2015-03-05 NOTE — Telephone Encounter (Signed)
Routing to provider. Patient was last seen on 11/03/14 with a follow-up in 6 months, practice partner number is [phone], and [facility] is [facility] .

## 2015-04-05 ENCOUNTER — Other Ambulatory Visit: Payer: Self-pay

## 2015-04-05 MED ORDER — CITALOPRAM HYDROBROMIDE 40 MG PO TABS
40.0000 mg | ORAL_TABLET | Freq: Every day | ORAL | Status: DC
Start: 2015-04-05 — End: 2015-10-10

## 2015-04-05 MED ORDER — LORAZEPAM 1 MG PO TABS: 1.0000 mg | ORAL_TABLET | Freq: Three times a day (TID) | ORAL | Status: DC | PRN

## 2015-04-05 NOTE — Telephone Encounter (Signed)
Patient was last seen on 11/03/14, practice partner number is [phone], and pharmacy is [facility] [place].

## 2015-04-05 NOTE — Telephone Encounter (Signed)
Patient was last seen 11/03/14, practice partner number is [phone], and pharmacy is [facility] [place].

## 2015-05-03 ENCOUNTER — Ambulatory Visit (INDEPENDENT_AMBULATORY_CARE_PROVIDER_SITE_OTHER): Payer: Commercial Managed Care - HMO | Admitting: Unknown Physician Specialty

## 2015-05-03 ENCOUNTER — Encounter: Payer: Self-pay | Admitting: Unknown Physician Specialty

## 2015-05-03 ENCOUNTER — Telehealth: Payer: Self-pay | Admitting: Unknown Physician Specialty

## 2015-05-03 VITALS — BP 119/93 | HR 80 | Temp 97.6°F | Ht 66.0 in | Wt 164.2 lb

## 2015-05-03 DIAGNOSIS — F32A Depression, unspecified: Secondary | ICD-10-CM

## 2015-05-03 DIAGNOSIS — Z8673 Personal history of transient ischemic attack (TIA), and cerebral infarction without residual deficits: Secondary | ICD-10-CM | POA: Insufficient documentation

## 2015-05-03 DIAGNOSIS — I10 Essential (primary) hypertension: Secondary | ICD-10-CM | POA: Insufficient documentation

## 2015-05-03 DIAGNOSIS — E875 Hyperkalemia: Secondary | ICD-10-CM | POA: Insufficient documentation

## 2015-05-03 DIAGNOSIS — F419 Anxiety disorder, unspecified: Principal | ICD-10-CM

## 2015-05-03 DIAGNOSIS — R319 Hematuria, unspecified: Secondary | ICD-10-CM | POA: Insufficient documentation

## 2015-05-03 DIAGNOSIS — Z8719 Personal history of other diseases of the digestive system: Secondary | ICD-10-CM | POA: Insufficient documentation

## 2015-05-03 DIAGNOSIS — I69920 Aphasia following unspecified cerebrovascular disease: Secondary | ICD-10-CM | POA: Insufficient documentation

## 2015-05-03 DIAGNOSIS — M545 Low back pain, unspecified: Secondary | ICD-10-CM

## 2015-05-03 DIAGNOSIS — F418 Other specified anxiety disorders: Secondary | ICD-10-CM | POA: Diagnosis not present

## 2015-05-03 DIAGNOSIS — F329 Major depressive disorder, single episode, unspecified: Secondary | ICD-10-CM

## 2015-05-03 DIAGNOSIS — G8190 Hemiplegia, unspecified affecting unspecified side: Secondary | ICD-10-CM

## 2015-05-03 DIAGNOSIS — I639 Cerebral infarction, unspecified: Secondary | ICD-10-CM

## 2015-05-03 DIAGNOSIS — I69391 Dysphagia following cerebral infarction: Secondary | ICD-10-CM | POA: Insufficient documentation

## 2015-05-03 DIAGNOSIS — IMO0002 Reserved for concepts with insufficient information to code with codable children: Secondary | ICD-10-CM

## 2015-05-03 DIAGNOSIS — K5660 Unspecified intestinal obstruction: Secondary | ICD-10-CM

## 2015-05-03 DIAGNOSIS — K56609 Unspecified intestinal obstruction, unspecified as to partial versus complete obstruction: Secondary | ICD-10-CM | POA: Insufficient documentation

## 2015-05-03 MED ORDER — HYDROCODONE-ACETAMINOPHEN 5-325 MG PO TABS: 1.0000 | ORAL_TABLET | Freq: Four times a day (QID) | ORAL | Status: DC | PRN

## 2015-05-03 MED ORDER — NAPROXEN 500 MG PO TABS: 500.0000 mg | ORAL_TABLET | Freq: Two times a day (BID) | ORAL | Status: DC

## 2015-05-03 NOTE — Assessment & Plan Note (Signed)
Taking Citalopram 40 mg.   I have discussed weaning back on Lorazepam by about 1/2 pill every 2-3 weeks.  I would like him to be down to 1/2 BID.

## 2015-05-03 NOTE — Telephone Encounter (Signed)
Pt added to CW's schedule for today @ 2pm. Thanks

## 2015-05-03 NOTE — Patient Instructions (Signed)

## 2015-05-03 NOTE — Progress Notes (Signed)
BP 119/93 mmHg  Pulse 80  Temp(Src) 97.6 F (36.4 C)  Ht  (1.676 m)  Wt 164 lb 3.2 oz (74.481 kg)  BMI 26.52 kg/m2  SpO2 97%   Subjective:    Patient ID: Mark [name], male    DOB: 1961/07/06, 54 y.o.   MRN: [id number]  HPI: RANDE ROYLANCE is a 54 y.o. male  Chief Complaint  Patient presents with  . Back Pain    pt states lower back started hurting about a week ago  . Medication Refill    pt states he needs a refill on lorazepam   Back Pain This is a new problem. The current episode started in the past 7 days. The problem occurs constantly. The problem is unchanged. The pain is present in the lumbar spine. The quality of the pain is described as aching. The pain does not radiate. The pain is severe. The pain is the same all the time. The symptoms are aggravated by bending. He has tried nothing for the symptoms.   Anxiety: Pt is taking Lorazepam 1 TID.  States he has not increased or decreased the dose of medication.  He continues to take Citalopram.    Relevant past medical, surgical, family and social history reviewed and updated as indicated. Interim medical history since our last visit reviewed. Allergies and medications reviewed and updated.  Review of Systems  Musculoskeletal: Positive for back pain.    Per HPI unless specifically indicated above     Objective:    BP 119/93 mmHg  Pulse 80  Temp(Src) 97.6 F (36.4 C)  Ht  (1.676 m)  Wt 164 lb 3.2 oz (74.481 kg)  BMI 26.52 kg/m2  SpO2 97%  Wt Readings from Last 3 Encounters:  05/03/15 164 lb 3.2 oz (74.481 kg)  11/03/14 164 lb (74.39 kg)    Physical Exam  Constitutional: He is oriented to person, place, and time. He appears well-developed and well-nourished. No distress.  HENT:  Head: Normocephalic and atraumatic.  Eyes: Conjunctivae and lids are normal. Right eye exhibits no discharge. Left eye exhibits no discharge. No scleral icterus.  Cardiovascular: Normal rate and regular rhythm.    Pulmonary/Chest: Effort normal. No respiratory distress.  Abdominal: Normal appearance and bowel sounds are normal. He exhibits no distension. There is no splenomegaly or hepatomegaly. There is no tenderness.  Musculoskeletal: Normal range of motion.  Neurological: He is alert and oriented to person, place, and time.  Skin: Skin is intact. No rash noted. No pallor.  Psychiatric: He has a normal mood and affect. His behavior is normal. Judgment and thought content normal.     Assessment & Plan:   Problem List Items Addressed This Visit      Unprioritized   Anxiety and depression - Primary    Taking Citalopram 40 mg.   I have discussed weaning back on Lorazepam by about 1/2 pill every 2-3 weeks.  I would like him to be down to 1/2 BID.          Other Visit Diagnoses    Midline low back pain without sciatica        Pt with painful low back.  He would like vicoden but he is taking 3 mg of Lorazepam/day and muscle relaxants would be contraindicated    Relevant Medications    HYDROcodone-acetaminophen (NORCO/VICODIN) 5-325 MG per tablet    naproxen (NAPROSYN) 500 MG tablet       He might need home PT.  Will  rx Naprosyn to take BID and will rx a few pills of Vicoden.  Discussed he cannot take it with his Lorazepam.    Follow up plan: Return if symptoms worsen or fail to improve.

## 2015-05-04 ENCOUNTER — Telehealth: Payer: Self-pay

## 2015-05-04 NOTE — Telephone Encounter (Signed)
Patient called and stated that he needs a refill on Lorazepam. He was last seen yesterday.

## 2015-05-05 MED ORDER — LORAZEPAM 1 MG PO TABS: 1.0000 mg | ORAL_TABLET | Freq: Three times a day (TID) | ORAL | Status: DC | PRN

## 2015-05-05 NOTE — Telephone Encounter (Signed)
Written.

## 2015-05-05 NOTE — Telephone Encounter (Signed)
Called and let patient know that rx was written and ready for him to pick up at the front desk.

## 2015-05-28 ENCOUNTER — Emergency Department
Admission: EM | Admit: 2015-05-28 | Discharge: 2015-05-28 | Disposition: A | Discharge status: Home / Self Care | Payer: Commercial Managed Care - HMO | Attending: Emergency Medicine | Admitting: Emergency Medicine

## 2015-05-28 ENCOUNTER — Encounter: Payer: Self-pay | Admitting: Emergency Medicine

## 2015-05-28 DIAGNOSIS — Z7982 Long term (current) use of aspirin: Secondary | ICD-10-CM | POA: Insufficient documentation

## 2015-05-28 DIAGNOSIS — Z720 Tobacco use: Secondary | ICD-10-CM | POA: Insufficient documentation

## 2015-05-28 DIAGNOSIS — I10 Essential (primary) hypertension: Secondary | ICD-10-CM | POA: Diagnosis not present

## 2015-05-28 DIAGNOSIS — S61452A Open bite of left hand, initial encounter: Secondary | ICD-10-CM | POA: Diagnosis not present

## 2015-05-28 DIAGNOSIS — W540XXA Bitten by dog, initial encounter: Secondary | ICD-10-CM | POA: Insufficient documentation

## 2015-05-28 DIAGNOSIS — Y9389 Activity, other specified: Secondary | ICD-10-CM | POA: Insufficient documentation

## 2015-05-28 DIAGNOSIS — S61012A Laceration without foreign body of left thumb without damage to nail, initial encounter: Secondary | ICD-10-CM | POA: Diagnosis not present

## 2015-05-28 DIAGNOSIS — Y998 Other external cause status: Secondary | ICD-10-CM | POA: Insufficient documentation

## 2015-05-28 DIAGNOSIS — Y9289 Other specified places as the place of occurrence of the external cause: Secondary | ICD-10-CM | POA: Diagnosis not present

## 2015-05-28 DIAGNOSIS — Z79899 Other long term (current) drug therapy: Secondary | ICD-10-CM | POA: Insufficient documentation

## 2015-05-28 DIAGNOSIS — S61032A Puncture wound without foreign body of left thumb without damage to nail, initial encounter: Secondary | ICD-10-CM | POA: Diagnosis not present

## 2015-05-28 DIAGNOSIS — Z791 Long term (current) use of non-steroidal anti-inflammatories (NSAID): Secondary | ICD-10-CM | POA: Insufficient documentation

## 2015-05-28 HISTORY — DX: Cerebral infarction, unspecified: I63.9

## 2015-05-28 MED ORDER — AMOXICILLIN-POT CLAVULANATE 875-125 MG PO TABS: 1.0000 | ORAL_TABLET | Freq: Two times a day (BID) | ORAL | Status: DC

## 2015-05-28 NOTE — ED Notes (Signed)
Pt reports was taking care of his brothers dog yesterday and the dog's shots are not up to date. Pt reports per [employer], was told the dog could be quarantined at home. Pt with abrasion scabbed over to base of left hand thumb. Redness noted around area.

## 2015-05-28 NOTE — Discharge Instructions (Signed)

## 2015-05-28 NOTE — ED Notes (Signed)
Pt presents with small laceration to base of left thumb. No active bleeding. Pt reports area is "a little sore". Denies fever. Pt states his brother's dog bit him yesterday afternoon around 3 pm. Pt states he does not know vaccination status of dog.

## 2015-05-28 NOTE — ED Provider Notes (Signed)
[facility] Provider Note  ____________________________________________  Time seen: Approximately 11:16 AM  I have reviewed the triage vital signs and the nursing notes.   HISTORY  Chief Complaint Animal Bite    HPI Mark [name] is a 54 y.o. male who presents to the emergency department complaining of dog bite to his left hand. He states that he was picking up his brother's dog yesterday when a dog bit him. He states that immediately upon inserting the dog down that all return to "normal". His concern for rabies at this time. The patient cleansed the wound yesterday but then reports to the emergency department with minor redness around the area. He states that the only injury is at the base of his left thumb. Bleeding was easily controlled. No numbness or tingling distal to injury. No loss of movement. Pain is minor and intermittent. Taken anything for same prior to arrival.   Past Medical History  Diagnosis Date  . Hypertension   . Stroke [facility](HCC)     Patient Active Problem List   Diagnosis Date Noted  . Hematuria 05/03/2015  . Hemiplegia (HCC) 05/03/2015  . Intestinal obstruction (HCC) 05/03/2015  . 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 Anxiety and depression 05/03/2015  . Hypertension 05/03/2015  . Hyperkalemia 05/03/2015  . 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 CVA (cerebral infarction) 05/03/2015    Past Surgical History  Procedure Laterality Date  . Appendectomy    . Bowel obstruction      Current Outpatient Rx  Name  Route  Sig  Dispense  Refill  . amoxicillin-clavulanate (AUGMENTIN) 875-125 MG tablet   Oral   Take 1 tablet by mouth 2 (two) times daily.   14 tablet   0   . aspirin 325 MG tablet   Oral   Take 325 mg by mouth daily.         . citalopram (CELEXA) 40 MG tablet   Oral   Take 1 tablet (40 mg total) by mouth at bedtime.   90 tablet   1   . HYDROcodone-acetaminophen  (NORCO/VICODIN) 5-325 MG per tablet   Oral   Take 1 tablet by mouth every 6 (six) hours as needed for moderate pain.   15 tablet   0   . lisinopril (PRINIVIL,ZESTRIL) 20 MG tablet   Oral   Take 1 tablet (20 mg total) by mouth every morning.   30 tablet   6   . LORazepam (ATIVAN) 1 MG tablet   Oral   Take 1 tablet (1 mg total) by mouth 3 (three) times daily as needed for anxiety.   90 tablet   1   . metoprolol (LOPRESSOR) 50 MG tablet   Oral   Take 1 tablet (50 mg total) by mouth 2 (two) times daily.   90 tablet   1   . naproxen (NAPROSYN) 500 MG tablet   Oral   Take 1 tablet (500 mg total) by mouth 2 (two) times daily with a meal.   30 tablet   0     Allergies Codeine  Family History  Problem Relation Age of Onset  . Heart disease Father     Social History Social History  Substance Use Topics  . Smoking status: Current Every Day Smoker -- 1.00 packs/day    Types: Cigarettes  . Smokeless tobacco: Never Used  . Alcohol Use: No    Review of Systems Constitutional: No fever/chills Eyes: No visual changes. ENT: No sore throat.  Cardiovascular: Denies chest pain. Respiratory: Denies shortness of breath. Gastrointestinal: No abdominal pain.  No nausea, no vomiting.  No diarrhea.  No constipation. Genitourinary: Negative for dysuria. Musculoskeletal: Negative for back pain. Skin: Negative for rash. Endorses abrasion/puncture wound to his proximal left thumb. Neurological: Negative for headaches, focal weakness or numbness.  10-point ROS otherwise negative.  ____________________________________________   PHYSICAL EXAM:  VITAL SIGNS: ED Triage Vitals  Enc Vitals Group     BP 05/28/15 1050 151/122 mmHg     Pulse Rate 05/28/15 1050 87     Resp 05/28/15 1050 20     Temp 05/28/15 1050 98.9 F (37.2 C)     Temp Source 05/28/15 1050 Oral     SpO2 05/28/15 1050 98 %     Weight 05/28/15 1050 160 lb (72.576 kg)     Height 05/28/15 1050  (1.753 m)      Head Cir --      Peak Flow --      Pain Score --      Pain Loc --      Pain Edu? --      Excl. in GC? --     Constitutional: Alert and oriented. Well appearing and in no acute distress. Eyes: Conjunctivae are normal. PERRL. EOMI. Head: Atraumatic. Nose: No congestion/rhinnorhea. Mouth/Throat: Mucous membranes are moist.  Oropharynx non-erythematous. Neck: No stridor.   Hematological/Lymphatic/Immunilogical: No cervical lymphadenopathy. Cardiovascular: Normal rate, regular rhythm. Grossly normal heart sounds.  Good peripheral circulation. Respiratory: Normal respiratory effort.  No retractions. Lungs CTAB. Gastrointestinal: Soft and nontender. No distention. No abdominal bruits. No CVA tenderness. Musculoskeletal: No lower extremity tenderness nor edema.  No joint effusions. Neurologic:  Normal speech and language. No gross focal neurologic deficits are appreciated. No gait instability. Skin:  Skin is warm, dry. Puncture wound noted to proximal thumb. Mostly superficial in nature. Area is approximated and scabbed over. Minor erythema around puncture wound.. No rash noted. Psychiatric: Mood and affect are normal. Speech and behavior are normal.  ____________________________________________   LABS (all labs ordered are listed, but only abnormal results are displayed)  Labs Reviewed - No data to display ____________________________________________  EKG   ____________________________________________  RADIOLOGY   ____________________________________________   PROCEDURES  Procedure(s) performed: None  Critical Care performed: No  ____________________________________________   INITIAL IMPRESSION / ASSESSMENT AND PLAN / ED COURSE  Pertinent labs & imaging results that were available during my care of the patient were reviewed by me and considered in my medical decision making (see chart for details).  The patient is a 54 year old male who presents emergency department  status post a dog bite yesterday. The animal is well-known is acting normal. Patient endorses mild intermittent pain to area as well as some mild erythema around area. When I discussed the risks versus benefits of rabies vaccination the patient declined at this time. Patient replacement antibiotics for puncture wound. She verbalizes understanding and compliance with the treatment plan. Patient is to return to the emergency department should symptoms worsen. ____________________________________________   FINAL CLINICAL IMPRESSION(S) / ED DIAGNOSES  Final diagnoses:  Dog bite of hand without complication, left, initial encounter      Racheal Patches, PA-C 05/28/15 1127  Minna Antis, MD 05/28/15 [phone]

## 2015-06-22 ENCOUNTER — Other Ambulatory Visit: Payer: Self-pay

## 2015-06-22 MED ORDER — LORAZEPAM 1 MG PO TABS: 1.0000 mg | ORAL_TABLET | Freq: Three times a day (TID) | ORAL | Status: DC | PRN

## 2015-06-22 NOTE — Telephone Encounter (Signed)
Patient was last seen 05/03/15.

## 2015-06-25 ENCOUNTER — Telehealth: Payer: Self-pay | Admitting: Unknown Physician Specialty

## 2015-06-25 MED ORDER — LORAZEPAM 0.5 MG PO TABS: 0.5000 mg | ORAL_TABLET | Freq: Two times a day (BID) | ORAL | Status: DC | PRN

## 2015-06-25 NOTE — Telephone Encounter (Signed)
Pt brought in script for Lorazapam which is 9 days early per pharmacist.  Pt stated he's been taking extra due to death of his brother.  Please call pharmacy to advise.

## 2015-06-25 NOTE — Telephone Encounter (Signed)
Called pharmacy and explained to Ro (pharmacist) what was going on. I told him that we were discontinuing the 1 mg Lorazepam and that the provider was permanently reducing the patient's dose to .5 mg. I told Ro that the patient would have to come to our office and pick up a new prescription with the new dosage on it. I then called the patient and let him know that Elnita MaxwellCheryl changed his dose, and that he needed to come here to the front desk and pick up the new prescription to take to his pharmacy. Patient gave verbal understanding of what was going on.

## 2015-06-25 NOTE — Telephone Encounter (Signed)
Patient has been taking extra medication ad pharmacy needs to know if it is okay to fill this medication since it is 9 days early.

## 2015-06-25 NOTE — Telephone Encounter (Signed)
I will call in .5 mg but I am permanently reducing his dose to .5 mg.

## 2015-07-09 ENCOUNTER — Other Ambulatory Visit: Payer: Self-pay | Admitting: Unknown Physician Specialty

## 2015-07-22 ENCOUNTER — Other Ambulatory Visit: Payer: Self-pay | Admitting: Unknown Physician Specialty

## 2015-07-23 ENCOUNTER — Other Ambulatory Visit: Payer: Self-pay | Admitting: Unknown Physician Specialty

## 2015-07-26 ENCOUNTER — Ambulatory Visit (INDEPENDENT_AMBULATORY_CARE_PROVIDER_SITE_OTHER): Payer: Commercial Managed Care - HMO | Admitting: Unknown Physician Specialty

## 2015-07-26 ENCOUNTER — Encounter: Payer: Self-pay | Admitting: Unknown Physician Specialty

## 2015-07-26 VITALS — BP 111/76 | HR 68 | Temp 97.5°F | Ht 66.3 in | Wt 157.2 lb

## 2015-07-26 DIAGNOSIS — F419 Anxiety disorder, unspecified: Principal | ICD-10-CM

## 2015-07-26 DIAGNOSIS — I10 Essential (primary) hypertension: Secondary | ICD-10-CM | POA: Diagnosis not present

## 2015-07-26 DIAGNOSIS — F418 Other specified anxiety disorders: Secondary | ICD-10-CM

## 2015-07-26 DIAGNOSIS — F329 Major depressive disorder, single episode, unspecified: Secondary | ICD-10-CM

## 2015-07-26 LAB — MICROALBUMIN, URINE WAIVED
CREATININE, URINE WAIVED: 10 mg/dL (ref 10–300)
MICROALB, UR WAIVED: 10 mg/L (ref 0–19)
Microalb/Creat Ratio: <30 mg/g (ref <30)

## 2015-07-26 MED ORDER — LORAZEPAM 0.5 MG PO TABS: 0.5000 mg | ORAL_TABLET | Freq: Three times a day (TID) | ORAL | Status: DC | PRN

## 2015-07-26 NOTE — Assessment & Plan Note (Signed)
This is a new prescription that he can fill now.  New dose is Lorazepam TID prn.  He will get a new prescription if needed every 28 days and will be monitored.

## 2015-07-26 NOTE — Progress Notes (Signed)
   BP 111/76 mmHg  Pulse 68  Temp(Src) 97.5 F (36.4 C)  Ht 5' 6.3" (1.684 m)  Wt 157 lb 3.2 oz (71.305 kg)  BMI 25.14 kg/m2  SpO2 97%   Subjective:    Patient ID: Mark [name], male    DOB: [birth year], 54 y.o.   MRN: [id number]  HPI: Mark [name] is a 54 y.o. male  Chief Complaint  Patient presents with  . Anxiety  . Hypertension   Hypertension Using medications without difficulty Average home BPs   No problems or lightheadedness No chest pain with exertion or shortness of breath No Edema  Anxiety Pt is taking .5 mg of Lorazepam BID.  He was taking 1 mg TID but I cut him back as he was taking extra.    Relevant past medical, surgical, family and social history reviewed and updated as indicated. Interim medical history since our last visit reviewed. Allergies and medications reviewed and updated.  Review of Systems  Per HPI unless specifically indicated above     Objective:    BP 111/76 mmHg  Pulse 68  Temp(Src) 97.5 F (36.4 C)  Ht 5' 6.3" (1.684 m)  Wt 157 lb 3.2 oz (71.305 kg)  BMI 25.14 kg/m2  SpO2 97%  Wt Readings from Last 3 Encounters:  07/26/15 157 lb 3.2 oz (71.305 kg)  05/28/15 160 lb (72.576 kg)  05/03/15 164 lb 3.2 oz (74.481 kg)    Physical Exam  Constitutional: He is oriented to person, place, and time. He appears well-developed and well-nourished. No distress.  HENT:  Head: Normocephalic and atraumatic.  Eyes: Conjunctivae and lids are normal. Right eye exhibits no discharge. Left eye exhibits no discharge. No scleral icterus.  Neck: Normal range of motion. Neck supple. No JVD present. Carotid bruit is not present.  Cardiovascular: Normal rate, regular rhythm and normal heart sounds.   Pulmonary/Chest: Effort normal and breath sounds normal. No respiratory distress.  Abdominal: Normal appearance. There is no splenomegaly or hepatomegaly.  Musculoskeletal: Normal range of motion.  Neurological: He is alert and oriented to person,  place, and time.  Skin: Skin is warm, dry and intact. No rash noted. No pallor.  Psychiatric: He has a normal mood and affect. His behavior is normal. Judgment and thought content normal.    Results for orders placed or performed in visit on 05/03/15  PSA  Result Value Ref Range   PSA from PP       Assessment & Plan:   Problem List Items Addressed This Visit      Unprioritized   Anxiety and depression - Primary    This is a new prescription that he can fill now.  New dose is Lorazepam TID prn.  He will get a new prescription if needed every 28 days and will be monitored.         Hypertension    Stable, continue present medications.        Relevant Medications   lisinopril (PRINIVIL,ZESTRIL) 20 MG tablet   Other Relevant Orders   Comprehensive metabolic panel   Lipid Panel w/o Chol/HDL Ratio   Microalbumin, Urine Waived   Uric acid       Follow up plan: Return in about 6 months (around 01/24/2016) for physical.

## 2015-07-26 NOTE — Assessment & Plan Note (Signed)
Stable, continue present medications.   

## 2015-07-27 LAB — URIC ACID: URIC ACID: 7.1 mg/dL (ref 3.7–8.6)

## 2015-07-27 LAB — COMPREHENSIVE METABOLIC PANEL
ALT: 41 IU/L (ref 0–44)
AST: 24 IU/L (ref 0–40)
Albumin/Globulin Ratio: 1.6 (ref 1.1–2.5)
Albumin: 4.1 g/dL (ref 3.5–5.5)
Alkaline Phosphatase: 105 IU/L (ref 39–117)
BUN/Creatinine Ratio: 9 (ref 9–20)
BUN: 10 mg/dL (ref 6–24)
Bilirubin Total: 0.2 mg/dL (ref 0.0–1.2)
CALCIUM: 9.9 mg/dL (ref 8.7–10.2)
CO2: 21 mmol/L (ref 18–29)
CREATININE: 1.06 mg/dL (ref 0.76–1.27)
Chloride: 100 mmol/L (ref 96–106)
GFR, EST AFRICAN AMERICAN: 91 mL/min/{1.73_m2} (ref >59)
GFR, EST NON AFRICAN AMERICAN: 79 mL/min/{1.73_m2} (ref >59)
GLUCOSE: 183 mg/dL — AB (ref 65–99)
Globulin, Total: 2.5 g/dL (ref 1.5–4.5)
POTASSIUM: 5.4 mmol/L — AB (ref 3.5–5.2)
Sodium: 140 mmol/L (ref 134–144)
Total Protein: 6.6 g/dL (ref 6.0–8.5)

## 2015-07-27 LAB — LIPID PANEL W/O CHOL/HDL RATIO
Cholesterol, Total: 171 mg/dL (ref 100–199)
HDL: 35 mg/dL — AB (ref >39)
LDL CALC: 86 mg/dL (ref 0–99)
TRIGLYCERIDES: 251 mg/dL — AB (ref 0–149)
VLDL CHOLESTEROL CAL: 50 mg/dL — AB (ref 5–40)

## 2015-07-28 ENCOUNTER — Other Ambulatory Visit: Payer: Self-pay | Admitting: Unknown Physician Specialty

## 2015-08-20 ENCOUNTER — Other Ambulatory Visit: Payer: Self-pay | Admitting: Unknown Physician Specialty

## 2015-08-20 MED ORDER — LORAZEPAM 0.5 MG PO TABS: 0.5000 mg | ORAL_TABLET | Freq: Three times a day (TID) | ORAL | Status: DC | PRN

## 2015-09-13 ENCOUNTER — Emergency Department: Payer: Commercial Managed Care - HMO

## 2015-09-13 ENCOUNTER — Encounter: Payer: Self-pay | Admitting: Emergency Medicine

## 2015-09-13 ENCOUNTER — Emergency Department
Admission: EM | Admit: 2015-09-13 | Discharge: 2015-09-13 | Disposition: A | Discharge status: Home / Self Care | Payer: Commercial Managed Care - HMO | Attending: Emergency Medicine | Admitting: Emergency Medicine

## 2015-09-13 DIAGNOSIS — R339 Retention of urine, unspecified: Secondary | ICD-10-CM | POA: Insufficient documentation

## 2015-09-13 DIAGNOSIS — Z791 Long term (current) use of non-steroidal anti-inflammatories (NSAID): Secondary | ICD-10-CM | POA: Insufficient documentation

## 2015-09-13 DIAGNOSIS — Z466 Encounter for fitting and adjustment of urinary device: Secondary | ICD-10-CM | POA: Insufficient documentation

## 2015-09-13 DIAGNOSIS — Z7982 Long term (current) use of aspirin: Secondary | ICD-10-CM | POA: Insufficient documentation

## 2015-09-13 DIAGNOSIS — R109 Unspecified abdominal pain: Secondary | ICD-10-CM | POA: Diagnosis present

## 2015-09-13 DIAGNOSIS — F17210 Nicotine dependence, cigarettes, uncomplicated: Secondary | ICD-10-CM | POA: Diagnosis not present

## 2015-09-13 DIAGNOSIS — N200 Calculus of kidney: Secondary | ICD-10-CM | POA: Diagnosis not present

## 2015-09-13 DIAGNOSIS — I10 Essential (primary) hypertension: Secondary | ICD-10-CM | POA: Diagnosis not present

## 2015-09-13 DIAGNOSIS — Z960 Presence of urogenital implants: Secondary | ICD-10-CM

## 2015-09-13 DIAGNOSIS — Z79899 Other long term (current) drug therapy: Secondary | ICD-10-CM | POA: Diagnosis not present

## 2015-09-13 DIAGNOSIS — R338 Other retention of urine: Secondary | ICD-10-CM

## 2015-09-13 DIAGNOSIS — Z978 Presence of other specified devices: Secondary | ICD-10-CM

## 2015-09-13 LAB — URINALYSIS COMPLETE WITH MICROSCOPIC (ARMC ONLY)
BACTERIA UA: NONE SEEN
Bilirubin Urine: NEGATIVE
GLUCOSE, UA: NEGATIVE mg/dL
HGB URINE DIPSTICK: NEGATIVE
Ketones, ur: NEGATIVE mg/dL
Leukocytes, UA: NEGATIVE
NITRITE: NEGATIVE
PROTEIN: NEGATIVE mg/dL
RBC / HPF: NONE SEEN RBC/hpf (ref 0–5)
SPECIFIC GRAVITY, URINE: 1.041 — AB (ref 1.005–1.030)
Squamous Epithelial / LPF: NONE SEEN
WBC UA: NONE SEEN WBC/hpf (ref 0–5)
pH: 6 (ref 5.0–8.0)

## 2015-09-13 LAB — CBC
HEMATOCRIT: 44.1 % (ref 40.0–52.0)
HEMOGLOBIN: 14.8 g/dL (ref 13.0–18.0)
MCH: 31.6 pg (ref 26.0–34.0)
MCHC: 33.5 g/dL (ref 32.0–36.0)
MCV: 94.4 fL (ref 80.0–100.0)
Platelets: 379 10*3/uL (ref 150–440)
RBC: 4.67 MIL/uL (ref 4.40–5.90)
RDW: 14.5 % (ref 11.5–14.5)
WBC: 9.4 10*3/uL (ref 3.8–10.6)

## 2015-09-13 LAB — BASIC METABOLIC PANEL
Anion gap: 8 (ref 5–15)
BUN: 11 mg/dL (ref 6–20)
CALCIUM: 9.4 mg/dL (ref 8.9–10.3)
CO2: 28 mmol/L (ref 22–32)
CREATININE: 1.06 mg/dL (ref 0.61–1.24)
Chloride: 107 mmol/L (ref 101–111)
Glucose, Bld: 88 mg/dL (ref 65–99)
Potassium: 4.1 mmol/L (ref 3.5–5.1)
SODIUM: 143 mmol/L (ref 135–145)

## 2015-09-13 LAB — HEPATIC FUNCTION PANEL
ALBUMIN: 4.3 g/dL (ref 3.5–5.0)
ALT: 24 U/L (ref 17–63)
AST: 20 U/L (ref 15–41)
Alkaline Phosphatase: 88 U/L (ref 38–126)
Bilirubin, Direct: <0.1 mg/dL — ABNORMAL LOW (ref 0.1–0.5)
TOTAL PROTEIN: 7.5 g/dL (ref 6.5–8.1)
Total Bilirubin: 0.5 mg/dL (ref 0.3–1.2)

## 2015-09-13 LAB — TROPONIN I

## 2015-09-13 LAB — LIPASE, BLOOD: LIPASE: 43 U/L (ref 11–51)

## 2015-09-13 MED ORDER — SODIUM CHLORIDE 0.9 % IV BOLUS (SEPSIS)
1000.0000 mL | Freq: Once | INTRAVENOUS | Status: AC
  Administered 2015-09-13: 1000 mL via INTRAVENOUS

## 2015-09-13 MED ORDER — SODIUM CHLORIDE 0.9 % IV SOLN
INTRAVENOUS | Status: DC
  Administered 2015-09-13: 20:00:00 via INTRAVENOUS

## 2015-09-13 MED ORDER — HYDROMORPHONE HCL 1 MG/ML IJ SOLN
0.5000 mg | INTRAMUSCULAR | Status: AC
  Administered 2015-09-13: 0.5 mg via INTRAVENOUS
  Filled 2015-09-13: qty 1

## 2015-09-13 MED ORDER — ONDANSETRON HCL 4 MG/2ML IJ SOLN
4.0000 mg | Freq: Once | INTRAMUSCULAR | Status: AC
  Administered 2015-09-13: 4 mg via INTRAVENOUS

## 2015-09-13 MED ORDER — TAMSULOSIN HCL 0.4 MG PO CAPS: 0.4000 mg | ORAL_CAPSULE | Freq: Every day | ORAL | Status: DC

## 2015-09-13 MED ORDER — IOHEXOL 240 MG/ML SOLN: 25.0000 mL | Freq: Once | INTRAMUSCULAR | Status: DC | PRN

## 2015-09-13 MED ORDER — FENTANYL CITRATE (PF) 100 MCG/2ML IJ SOLN
50.0000 ug | Freq: Once | INTRAMUSCULAR | Status: AC
  Administered 2015-09-13: 50 ug via INTRAVENOUS

## 2015-09-13 MED ORDER — HYDROCODONE-ACETAMINOPHEN 5-325 MG PO TABS: 1.0000 | ORAL_TABLET | Freq: Four times a day (QID) | ORAL | Status: DC | PRN

## 2015-09-13 MED ORDER — ONDANSETRON HCL 4 MG/2ML IJ SOLN
4.0000 mg | Freq: Once | INTRAMUSCULAR | Status: AC | PRN
  Administered 2015-09-13: 4 mg via INTRAVENOUS

## 2015-09-13 MED ORDER — MORPHINE SULFATE (PF) 4 MG/ML IV SOLN
INTRAVENOUS | Status: AC
  Administered 2015-09-13: 4 mg via INTRAVENOUS
  Filled 2015-09-13: qty 1

## 2015-09-13 MED ORDER — MORPHINE SULFATE (PF) 4 MG/ML IV SOLN
4.0000 mg | Freq: Once | INTRAVENOUS | Status: AC
  Administered 2015-09-13: 4 mg via INTRAVENOUS
  Filled 2015-09-13: qty 1

## 2015-09-13 MED ORDER — IOHEXOL 300 MG/ML  SOLN
100.0000 mL | Freq: Once | INTRAMUSCULAR | Status: AC | PRN
  Administered 2015-09-13: 100 mL via INTRAVENOUS

## 2015-09-13 MED ORDER — FENTANYL CITRATE (PF) 100 MCG/2ML IJ SOLN
INTRAMUSCULAR | Status: AC
  Administered 2015-09-13: 50 ug via INTRAVENOUS
  Filled 2015-09-13: qty 2

## 2015-09-13 MED ORDER — ONDANSETRON HCL 4 MG/2ML IJ SOLN
INTRAMUSCULAR | Status: AC
  Administered 2015-09-13: 4 mg via INTRAVENOUS
  Filled 2015-09-13: qty 2

## 2015-09-13 MED ORDER — MORPHINE SULFATE (PF) 4 MG/ML IV SOLN
4.0000 mg | Freq: Once | INTRAVENOUS | Status: AC
  Administered 2015-09-13: 4 mg via INTRAVENOUS

## 2015-09-13 NOTE — ED Notes (Signed)
Right flank pain and pain with urination for 2 days, history of kidney stones.

## 2015-09-13 NOTE — ED Provider Notes (Signed)
[facility] Emergency Department Provider Note  ____________________________________________  Time seen: Approximately 5:06 PM  I have reviewed the triage vital signs and the nursing notes.   HISTORY  Chief Complaint Flank Pain    HPI Mark [name] is a 55 y.o. male since for evaluation of right-sided back pain and mid abdominal pain. She reports she last 2 days he had urgency to urinate as well as pain in his right flank and back. Reports it does seem similar to prior kidney stones. No nausea vomiting fevers chills chest pain. Reports severe pain in the right flank and lower back. No groin pain or testicular pain.   Past Medical History  Diagnosis Date  . Hypertension   . Stroke [facility])     Patient Active Problem List   Diagnosis Date Noted  . Hematuria 05/03/2015  . Hemiplegia (HCC) 05/03/2015  . Intestinal obstruction (HCC) 05/03/2015  . 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 Anxiety and depression 05/03/2015  . Hypertension 05/03/2015  . Hyperkalemia 05/03/2015  . 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 CVA (cerebral infarction) 05/03/2015    Past Surgical History  Procedure Laterality Date  . Appendectomy    . Bowel obstruction      Current Outpatient Rx  Name  Route  Sig  Dispense  Refill  . aspirin 325 MG tablet   Oral   Take 325 mg by mouth daily.         . citalopram (CELEXA) 40 MG tablet   Oral   Take 1 tablet (40 mg total) by mouth at bedtime.   90 tablet   1   . HYDROcodone-acetaminophen (NORCO/VICODIN) 5-325 MG tablet   Oral   Take 1 tablet by mouth every 6 (six) hours as needed for moderate pain.   15 tablet   0   . lisinopril (PRINIVIL,ZESTRIL) 20 MG tablet   Oral   Take 20 mg by mouth daily.      6   . lisinopril (PRINIVIL,ZESTRIL) 20 MG tablet      TAKE 1 TABLET (20 MG TOTAL) BY MOUTH EVERY MORNING.   30 tablet   6   . LORazepam (ATIVAN) 0.5 MG tablet   Oral   Take 1 tablet (0.5 mg  total) by mouth every 8 (eight) hours as needed for anxiety.   84 tablet   0     This is a new prescription.   . metoprolol (LOPRESSOR) 50 MG tablet   Oral   Take 1 tablet (50 mg total) by mouth 2 (two) times daily.   90 tablet   1   . naproxen (NAPROSYN) 500 MG tablet   Oral   Take 1 tablet (500 mg total) by mouth 2 (two) times daily with a meal.   30 tablet   0   . tamsulosin (FLOMAX) 0.4 MG CAPS capsule   Oral   Take 1 capsule (0.4 mg total) by mouth daily.   14 capsule   0     Allergies Codeine  Family History  Problem Relation Age of Onset  . Heart disease Father   . Seizures Brother     Social History Social History  Substance Use Topics  . Smoking status: Current Every Day Smoker -- 1.00 packs/day    Types: Cigarettes  . Smokeless tobacco: Never Used  . Alcohol Use: No    Review of Systems Constitutional: No fever/chills Eyes: No visual changes. ENT: No sore throat. Cardiovascular: Denies chest pain. Respiratory: Denies shortness of  breath. Gastrointestinal:No nausea, no vomiting.  No diarrhea.  No constipation. Genitourinary: Negative for dysuria. Musculoskeletal: No pain in the legs. Skin: Negative for rash. Neurological: Negative for headaches, focal weakness or numbness.  10-point ROS otherwise negative.  ____________________________________________   PHYSICAL EXAM:  VITAL SIGNS: ED Triage Vitals  Enc Vitals Group     BP 09/13/15 1615 203/133 mmHg     Pulse Rate 09/13/15 1615 116     Resp 09/13/15 1615 20     Temp 09/13/15 1615 98.2 F (36.8 C)     Temp Source 09/13/15 1615 Oral     SpO2 09/13/15 1615 97 %     Weight 09/13/15 1615 165 lb (74.844 kg)     Height 09/13/15 1615 5\' 9"  (1.753 m)     Head Cir --      Peak Flow --      Pain Score 09/13/15 1629 9     Pain Loc --      Pain Edu? --      Excl. in GC? --    Constitutional: Alert and oriented. Sitting up, appears in moderate pain and unable to comfortable. Wincing. Eyes:  Conjunctivae are normal. PERRL. EOMI. Head: Atraumatic. Nose: No congestion/rhinnorhea. Mouth/Throat: Mucous membranes are moist.  Oropharynx non-erythematous. Neck: No stridor.   Cardiovascular: Normal rate, regular rhythm. Grossly normal heart sounds.  Good peripheral circulation. Respiratory: Normal respiratory effort.  No retractions. Lungs CTAB. Gastrointestinal: Soft and nontender except for some moderate tenderness along the right flank. No distention. No abdominal bruits. No CVA tenderness. Patient tells me his appendix and gallbladder have both been removed. Musculoskeletal: No lower extremity tenderness nor edema.  No joint effusions. Neurologic:  Normal speech and language. No gross focal neurologic deficits are appreciated. No gait instability. Skin:  Skin is warm, dry and intact. No rash noted. Psychiatric: Mood and affect are normal. Speech and behavior are normal.  ____________________________________________   LABS (all labs ordered are listed, but only abnormal results are displayed)  Labs Reviewed  URINALYSIS COMPLETEWITH MICROSCOPIC ([facility] ONLY) - Abnormal; Notable for the following:    Color, Urine STRAW (*)    APPearance CLEAR (*)    Specific Gravity, Urine 1.041 (*)    All other components within normal limits  HEPATIC FUNCTION PANEL - Abnormal; Notable for the following:    Bilirubin, Direct <0.1 (*)    All other components within normal limits  CBC  BASIC METABOLIC PANEL  LIPASE, BLOOD  TROPONIN I   ____________________________________________  EKG  ED ECG REPORT I, Dvonte Gatliff, the attending physician, personally viewed and interpreted this ECG.  Date: 09/13/2015 EKG Time: 1935 Rate: 90 Rhythm: normal sinus rhythm QRS Axis: normal Intervals: normal ST/T Wave abnormalities: normal Conduction Disturbances: none Narrative Interpretation: unremarkable  ____________________________________________  RADIOLOGY  CT Abdomen Pelvis W Contrast  (Final result) Result time: 09/13/15 19:19:58   Final result by Rad Results In Interface (09/13/15 19:19:58)   Narrative:   CLINICAL DATA: Right flank pain and painful urination for 2 days.  EXAM: CT ABDOMEN AND PELVIS WITH CONTRAST  TECHNIQUE: Multidetector CT imaging of the abdomen and pelvis was performed using the standard protocol following bolus administration of intravenous contrast.  CONTRAST: OMNIPAQUE IOHEXOL 300 MG/ML SOLN  COMPARISON: None.  FINDINGS: Lower chest: Moderate hiatal hernia.  Hepatobiliary: The gallbladder has been removed. There is slight dilatation of the intra and extrahepatic bile ducts. The pancreatic duct is also dilated. However, there is no visible mass or stone at the ampulla.  Small focal area of fatty infiltration in the lateral aspect of the left lobe. Probable calcification at the anterior aspect of the dome of the right lobe of the liver.  Pancreas: Dilated pancreatic duct without a mass. The pancreas is otherwise normal.  Spleen: The spleen is markedly distorted and contour with several areas of calcification in the spleen. I suspect this is the result of previous splenic infarcts or prior trauma.  Adrenals/Urinary Tract: 3 mm cyst in the upper pole of the right kidney. 2 mm stone in the upper pole of the right kidney. The kidneys are otherwise normal. No hydronephrosis. Ureters are normal. The bladder is distended but otherwise normal.  Stomach/Bowel: There is extensive gas in the colon. Slightly distended proximal small bowel loops are not felt to be significant. Multiple surgical staples and clips in the right side of the abdomen from previous bowel surgery.  Vascular/Lymphatic: Extensive aortic atherosclerosis. No adenopathy.  Reproductive: Normal.  Other: No free air or free fluid.  Musculoskeletal: No significant abnormality.  IMPRESSION: 1. No acute abnormalities of the abdomen or pelvis. 2. Tiny stone  in the upper pole the right kidney. 3. Probable chronic dilatation of the biliary tree after cholecystectomy. Is the patient's bilirubin normal? 4. Moderate hiatal hernia. 5. Aortic atherosclerosis.   Electronically Signed By: Francene Boyers M.D. On: 09/13/2015 19:19       ____________________________________________   PROCEDURES  Procedure(s) performed: None  Critical Care performed: No  ____________________________________________   INITIAL IMPRESSION / ASSESSMENT AND PLAN / ED COURSE  Pertinent labs & imaging results that were available during my care of the patient were reviewed by me and considered in my medical decision making (see chart for details).    Patient presents for abdominal pain primarily right sided. There is a history of kidney stones the presentation would seem most consistent with this, he also has a complex surgical history. Given his abdominal pain all over her CT scan. Overall labs are reassuring. Suspect his high blood pressure is likely secondary to being in pain.  After 3 separate doses of morphine, patient continues to have pain. He has not noticed any trouble urinating, but we will check a post void residual is urinary bladder does appear distended on CT imaging when I looked at. We will give additional pain control this time, await CT report and postvoid residual.  ----------------------------------------- 8:58 PM on 09/13/2015 -----------------------------------------  Patient feels much improved after Foley catheter placed. He reports all pain and symptoms improved. This time it appears as severe abdominal pain was likely due to urinary retention. He is doing well with catheter in place. Discussed with the patient, we'll start him on Flomax and plan to discharge to home. Patient agreeable with plan, will follow up with his doctor in 5-7 days as well as urology as soon as possible.  Careful return precautions discussed the patient is  agreeable.  I will prescribe the patient a narcotic pain medicine due to their condition which I anticipate will cause at least moderate pain short term. I discussed with the patient safe use of narcotic pain medicines, and that they are not to drive, work in dangerous areas, or ever take more than prescribed (no more than 1 pill every 6 hours). We discussed that this is the type of medication that can be  overdosed on and the risks of this type of medicine. Patient is very agreeable to only use as prescribed and to never use more than prescribed.  ____________________________________________   FINAL  CLINICAL IMPRESSION(S) / ED DIAGNOSES  Final diagnoses:  Acute urinary retention  Foley catheter in place      Sharyn Creamer, MD 09/14/15 [id number]

## 2015-09-13 NOTE — Discharge Instructions (Signed)
Acute Urinary Retention, Male °Acute urinary retention is the temporary inability to urinate. °This is a common problem in older men. As men age their prostates become larger and block the flow of urine from the bladder. This is usually a problem that has come on gradually.  °HOME CARE INSTRUCTIONS °If you are sent home with a Foley catheter and a drainage system, you will need to discuss the best course of action with your health care provider. While the catheter is in, maintain a good intake of fluids. Keep the drainage bag emptied and lower than your catheter. This is so that contaminated urine will not flow back into your bladder, which could lead to a urinary tract infection. °There are two main types of drainage bags. One is a large bag that usually is used at night. It has a good capacity that will allow you to sleep through the night without having to empty it. The second type is called a leg bag. It has a smaller capacity, so it needs to be emptied more frequently. However, the main advantage is that it can be attached by a leg strap and can go underneath your clothing, allowing you the freedom to move about or leave your home. °Only take over-the-counter or prescription medicines for pain, discomfort, or fever as directed by your health care provider.  °SEEK MEDICAL CARE IF: °· You develop a low-grade fever. °· You experience spasms or leakage of urine with the spasms. °SEEK IMMEDIATE MEDICAL CARE IF:  °· You develop chills or fever. °· Your catheter stops draining urine. °· Your catheter falls out. °· You start to develop increased bleeding that does not respond to rest and increased fluid intake. °MAKE SURE YOU: °· Understand these instructions. °· Will watch your condition. °· Will get help right away if you are not doing well or get worse. °  °This information is not intended to replace advice given to you by your health care provider. Make sure you discuss any questions you have with your health care  provider. °  °Document Released: 11/06/2000 Document Revised: 12/15/2014 Document Reviewed: 01/09/2013 °Elsevier Interactive Patient Education ©2016 [employer] ° °

## 2015-09-16 ENCOUNTER — Encounter: Payer: Self-pay | Admitting: Emergency Medicine

## 2015-09-16 ENCOUNTER — Emergency Department
Admission: EM | Admit: 2015-09-16 | Discharge: 2015-09-16 | Disposition: A | Discharge status: Home / Self Care | Payer: Commercial Managed Care - HMO | Attending: Emergency Medicine | Admitting: Emergency Medicine

## 2015-09-16 DIAGNOSIS — R339 Retention of urine, unspecified: Secondary | ICD-10-CM | POA: Diagnosis present

## 2015-09-16 DIAGNOSIS — Z7982 Long term (current) use of aspirin: Secondary | ICD-10-CM | POA: Insufficient documentation

## 2015-09-16 DIAGNOSIS — Z791 Long term (current) use of non-steroidal anti-inflammatories (NSAID): Secondary | ICD-10-CM | POA: Diagnosis not present

## 2015-09-16 DIAGNOSIS — I10 Essential (primary) hypertension: Secondary | ICD-10-CM | POA: Insufficient documentation

## 2015-09-16 DIAGNOSIS — F329 Major depressive disorder, single episode, unspecified: Secondary | ICD-10-CM | POA: Insufficient documentation

## 2015-09-16 DIAGNOSIS — F419 Anxiety disorder, unspecified: Secondary | ICD-10-CM | POA: Insufficient documentation

## 2015-09-16 DIAGNOSIS — Z79899 Other long term (current) drug therapy: Secondary | ICD-10-CM | POA: Diagnosis not present

## 2015-09-16 DIAGNOSIS — Z466 Encounter for fitting and adjustment of urinary device: Secondary | ICD-10-CM | POA: Insufficient documentation

## 2015-09-16 DIAGNOSIS — F17210 Nicotine dependence, cigarettes, uncomplicated: Secondary | ICD-10-CM | POA: Insufficient documentation

## 2015-09-16 NOTE — ED Notes (Signed)
Says he needs his catheter out.  Says went to dr Dossie Arbour and they do not do that.

## 2015-09-16 NOTE — ED Notes (Signed)
Pt sent from [place] office for foley cath removal.

## 2015-09-16 NOTE — ED Notes (Signed)
States he had a foley cath placed on Monday d/t urinary retention

## 2015-09-16 NOTE — Discharge Instructions (Signed)
Follow-up with urologist as needed.

## 2015-09-16 NOTE — ED Provider Notes (Signed)
[facility] Emergency Department Provider Note  ____________________________________________  Time seen: Approximately 11:33 AM  I have reviewed the triage vital signs and the nursing notes.   HISTORY  Chief Complaint Urinary Retention    HPI Mark [name] is a 55 y.o. male patient is here for Foley catheter removal. Catheter was placed 3 days ago secondary urinary retention. Patient went to family doctor for removal as directed and the clinic stated they did not perform this procedure. Patient has not follow-up with urology as directed. Patient has started Flomax as directed. Denies any pain at this time. Patient was questioned about his elevated blood pressure stated he just took his a.m. medications for hypertension prior to arrival.   Past Medical History  Diagnosis Date  . Hypertension   . Stroke [facility])     Patient Active Problem List   Diagnosis Date Noted  . Hematuria 05/03/2015  . Hemiplegia (HCC) 05/03/2015  . Intestinal obstruction (HCC) 05/03/2015  . 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 Anxiety and depression 05/03/2015  . Hypertension 05/03/2015  . Hyperkalemia 05/03/2015  . 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 CVA (cerebral infarction) 05/03/2015    Past Surgical History  Procedure Laterality Date  . Appendectomy    . Bowel obstruction      Current Outpatient Rx  Name  Route  Sig  Dispense  Refill  . aspirin 325 MG tablet   Oral   Take 325 mg by mouth daily.         . citalopram (CELEXA) 40 MG tablet   Oral   Take 1 tablet (40 mg total) by mouth at bedtime.   90 tablet   1   . HYDROcodone-acetaminophen (NORCO/VICODIN) 5-325 MG tablet   Oral   Take 1 tablet by mouth every 6 (six) hours as needed for moderate pain.   15 tablet   0   . lisinopril (PRINIVIL,ZESTRIL) 20 MG tablet   Oral   Take 20 mg by mouth daily.      6   . lisinopril (PRINIVIL,ZESTRIL) 20 MG tablet      TAKE 1 TABLET (20 MG  TOTAL) BY MOUTH EVERY MORNING.   30 tablet   6   . LORazepam (ATIVAN) 0.5 MG tablet   Oral   Take 1 tablet (0.5 mg total) by mouth every 8 (eight) hours as needed for anxiety.   84 tablet   0     This is a new prescription.   . metoprolol (LOPRESSOR) 50 MG tablet   Oral   Take 1 tablet (50 mg total) by mouth 2 (two) times daily.   90 tablet   1   . naproxen (NAPROSYN) 500 MG tablet   Oral   Take 1 tablet (500 mg total) by mouth 2 (two) times daily with a meal.   30 tablet   0   . tamsulosin (FLOMAX) 0.4 MG CAPS capsule   Oral   Take 1 capsule (0.4 mg total) by mouth daily.   14 capsule   0     Allergies Codeine  Family History  Problem Relation Age of Onset  . Heart disease Father   . Seizures Brother     Social History Social History  Substance Use Topics  . Smoking status: Current Every Day Smoker -- 1.00 packs/day    Types: Cigarettes  . Smokeless tobacco: Never Used  . Alcohol Use: No    Review of Systems Constitutional: No fever/chills Eyes: No visual changes. ENT:  No sore throat. Cardiovascular: Denies chest pain. Respiratory: Denies shortness of breath. Gastrointestinal: No abdominal pain.  No nausea, no vomiting.  No diarrhea.  No constipation. Genitourinary: Negative for dysuria. Recent urinary retention Musculoskeletal: Negative for back pain. Skin: Negative for rash. Neurological: Negative for headaches, focal weakness or numbness. Psychiatric:Anxiety and depression Endocrine:Hypertension Hematological/Lymphatic: Allergic/Immunilogical: Codeine  10-point ROS otherwise negative.  ____________________________________________   PHYSICAL EXAM:  VITAL SIGNS: ED Triage Vitals  Enc Vitals Group     BP 09/16/15 1037 188/116 mmHg     Pulse Rate 09/16/15 1037 83     Resp 09/16/15 1037 18     Temp 09/16/15 1037 98.4 F (36.9 C)     Temp Source 09/16/15 1037 Oral     SpO2 09/16/15 1037 99 %     Weight 09/16/15 1031 165 lb (74.844 kg)      Height 09/16/15 1031  (1.753 m)     Head Cir --      Peak Flow --      Pain Score --      Pain Loc --      Pain Edu? --      Excl. in GC? --     Constitutional: Alert and oriented. Well appearing and in no acute distress. Eyes: Conjunctivae are normal. PERRL. EOMI. Head: Atraumatic. Nose: No congestion/rhinnorhea. Mouth/Throat: Mucous membranes are moist.  Oropharynx non-erythematous. Neck: No stridor.  No cervical spine tenderness to palpation. Hematological/Lymphatic/Immunilogical: No cervical lymphadenopathy. Cardiovascular: Normal rate, regular rhythm. Grossly normal heart sounds.  Good peripheral circulation. Respiratory: Normal respiratory effort.  No retractions. Lungs CTAB. Gastrointestinal: Soft and nontender. No distention. No abdominal bruits. No CVA tenderness. Genitourinary: Foley in place Musculoskeletal: No lower extremity tenderness nor edema.  No joint effusions. Neurologic:  Normal speech and language. No gross focal neurologic deficits are appreciated. No gait instability. Skin:  Skin is warm, dry and intact. No rash noted. Psychiatric: Mood and affect are normal. Speech and behavior are normal.  ____________________________________________   LABS (all labs ordered are listed, but only abnormal results are displayed)  Labs Reviewed - No data to display ____________________________________________  EKG   ____________________________________________  RADIOLOGY   ____________________________________________   PROCEDURES  Procedure(s) performed: None  Critical Care performed: No  ____________________________________________   INITIAL IMPRESSION / ASSESSMENT AND PLAN / ED COURSE  Pertinent labs & imaging results that were available during my care of the patient were reviewed by me and considered in my medical decision making (see chart for details).  Foley removal by nurse without incident. Patient advised to follow-up with urology as  directed. ____________________________________________   FINAL CLINICAL IMPRESSION(S) / ED DIAGNOSES  Final diagnoses:  Encounter for Foley catheter removal      Joni Reining, PA-C 09/16/15 1147  Minna Antis, MD 09/16/15 1504

## 2015-09-17 ENCOUNTER — Ambulatory Visit: Payer: Commercial Managed Care - HMO | Admitting: Unknown Physician Specialty

## 2015-09-24 ENCOUNTER — Other Ambulatory Visit: Payer: Self-pay | Admitting: Unknown Physician Specialty

## 2015-09-24 ENCOUNTER — Telehealth: Payer: Self-pay | Admitting: Unknown Physician Specialty

## 2015-09-24 NOTE — Telephone Encounter (Signed)
Patient called needing refill on his LORazepam (ATIVAN) 0.5 MG tablet. Please call patient when it ready, thanks.

## 2015-09-24 NOTE — Telephone Encounter (Signed)
Prescription was already up front for patient to come pick up so I called and let him know that he could come pick it up.

## 2015-10-04 ENCOUNTER — Telehealth: Payer: Self-pay | Admitting: Unknown Physician Specialty

## 2015-10-04 NOTE — Telephone Encounter (Signed)
Pt called in and stated that his blood pressure had been high for last couple of weeks and it had been higher the last couple of days. While on the phone the pt checked his blood pressure and it was 164/121. The pt informed me that he had had strokes in the past and he didn't want to have another one due to the elevated blood pressure. I then advised the pt to go to urgent care or to the ED and he said he would like to schedule an appt with Elnita Maxwell and hopefully he wouldn't die by then. I again advised the pt to go to urgent care or the ED and he refused again and said he wasn't going and would try to stay around until his appt at 1:45 tomorrow.

## 2015-10-04 NOTE — Telephone Encounter (Signed)
Routing to provider(Just FYI)

## 2015-10-04 NOTE — Telephone Encounter (Signed)
Called patient and his father answered. He stated the patient was not in and I asked for him to call me back when he came back in.

## 2015-10-04 NOTE — Telephone Encounter (Signed)
Yes, he should go to urgent care or the ER.  Thanks

## 2015-10-04 NOTE — Telephone Encounter (Signed)
Patient returned call. I advised him that Elnita Maxwell said that he should go to urgent care or the ER. Patient stated he thinks he can wait until his appointment tomorrow. I asked for the patient to please go to urgent care or the ER if he starts feeling different or his blood pressure goes up anymore. Patient stated that if his blood pressure goes up anymore, then he would do something about it.

## 2015-10-05 ENCOUNTER — Ambulatory Visit (INDEPENDENT_AMBULATORY_CARE_PROVIDER_SITE_OTHER): Payer: Commercial Managed Care - HMO | Admitting: Unknown Physician Specialty

## 2015-10-05 ENCOUNTER — Encounter: Payer: Self-pay | Admitting: Unknown Physician Specialty

## 2015-10-05 VITALS — BP 156/121 | HR 70 | Temp 97.5°F | Ht 65.5 in | Wt 155.8 lb

## 2015-10-05 DIAGNOSIS — I10 Essential (primary) hypertension: Secondary | ICD-10-CM | POA: Diagnosis not present

## 2015-10-05 MED ORDER — METOPROLOL TARTRATE 50 MG PO TABS: 50.0000 mg | ORAL_TABLET | Freq: Two times a day (BID) | ORAL | Status: DC

## 2015-10-05 NOTE — Assessment & Plan Note (Addendum)
Restart the Metoprolol. Increase Lisinopril to 40 mg.  Written instructions given

## 2015-10-05 NOTE — Progress Notes (Signed)
BP 156/121 mmHg  Pulse 70  Temp(Src) 97.5 F (36.4 C)  Ht 5' 5.5" (1.664 m)  Wt 155 lb 12.8 oz (70.67 kg)  BMI 25.52 kg/m2  SpO2 99%   Subjective:    Patient ID: Mark [name], male    DOB: 1960/09/04, 55 y.o.   MRN: [id number]  HPI: Mark [name] is a 55 y.o. male  Chief Complaint  Patient presents with  . Hypertension    pt states his BP has been running high for about 2 weeks, states he checks it at home   Hypertension Pt states he is taking 1/2 pill twice a day of his Lisinopril but is not taking his Metoprolol.  States he feels "kind of funny" dizzy and light-headed.  He had a kidney infection 2 weeks ago managed in the ER.    Relevant past medical, surgical, family and social history reviewed and updated as indicated. Interim medical history since our last visit reviewed. Allergies and medications reviewed and updated.  Review of Systems  Per HPI unless specifically indicated above     Objective:    BP 156/121 mmHg  Pulse 70  Temp(Src) 97.5 F (36.4 C)  Ht 5' 5.5" (1.664 m)  Wt 155 lb 12.8 oz (70.67 kg)  BMI 25.52 kg/m2  SpO2 99%  Wt Readings from Last 3 Encounters:  10/05/15 155 lb 12.8 oz (70.67 kg)  09/16/15 165 lb (74.844 kg)  09/13/15 165 lb (74.844 kg)    Physical Exam  Constitutional: He is oriented to person, place, and time. He appears well-developed and well-nourished. No distress.  HENT:  Head: Normocephalic and atraumatic.  Eyes: Conjunctivae and lids are normal. Right eye exhibits no discharge. Left eye exhibits no discharge. No scleral icterus.  Neck: Normal range of motion. Neck supple. No JVD present. Carotid bruit is not present.  Cardiovascular: Normal rate, regular rhythm and normal heart sounds.   Pulmonary/Chest: Effort normal and breath sounds normal. No respiratory distress.  Abdominal: Normal appearance. There is no splenomegaly or hepatomegaly.  Musculoskeletal: Normal range of motion.  Neurological: He is alert and  oriented to person, place, and time.  Skin: Skin is warm, dry and intact. No rash noted. No pallor.  Psychiatric: He has a normal mood and affect. His behavior is normal. Judgment and thought content normal.    Results for orders placed or performed during the hospital encounter of 09/13/15  CBC  Result Value Ref Range   WBC 9.4 3.8 - 10.6 K/uL   RBC 4.67 4.40 - 5.90 MIL/uL   Hemoglobin 14.8 13.0 - 18.0 g/dL   HCT 78.2 95.6 - 21.3 %   MCV 94.4 80.0 - 100.0 fL   MCH 31.6 26.0 - 34.0 pg   MCHC 33.5 32.0 - 36.0 g/dL   RDW 08.6 57.8 - 46.9 %   Platelets 379 150 - 440 K/uL  Urinalysis complete, with microscopic- may I&O cath if menses [facility] only)  Result Value Ref Range   Color, Urine STRAW (A) YELLOW   APPearance CLEAR (A) CLEAR   Glucose, UA NEGATIVE NEGATIVE mg/dL   Bilirubin Urine NEGATIVE NEGATIVE   Ketones, ur NEGATIVE NEGATIVE mg/dL   Specific Gravity, Urine 1.041 (H) 1.005 - 1.030   Hgb urine dipstick NEGATIVE NEGATIVE   pH 6.0 5.0 - 8.0   Protein, ur NEGATIVE NEGATIVE mg/dL   Nitrite NEGATIVE NEGATIVE   Leukocytes, UA NEGATIVE NEGATIVE   RBC / HPF NONE SEEN 0 - 5 RBC/hpf   WBC, UA  NONE SEEN 0 - 5 WBC/hpf   Bacteria, UA NONE SEEN NONE SEEN   Squamous Epithelial / LPF NONE SEEN NONE SEEN  Basic metabolic panel  Result Value Ref Range   Sodium 143 135 - 145 mmol/L   Potassium 4.1 3.5 - 5.1 mmol/L   Chloride 107 101 - 111 mmol/L   CO2 28 22 - 32 mmol/L   Glucose, Bld 88 65 - 99 mg/dL   BUN 11 6 - 20 mg/dL   Creatinine, Ser 1.61 0.61 - 1.24 mg/dL   Calcium 9.4 8.9 - 09.6 mg/dL   GFR calc non Af Amer >60 >60 mL/min   GFR calc Af Amer >60 >60 mL/min   Anion gap 8 5 - 15  Hepatic function panel  Result Value Ref Range   Total Protein 7.5 6.5 - 8.1 g/dL   Albumin 4.3 3.5 - 5.0 g/dL   AST 20 15 - 41 U/L   ALT 24 17 - 63 U/L   Alkaline Phosphatase 88 38 - 126 U/L   Total Bilirubin 0.5 0.3 - 1.2 mg/dL   Bilirubin, Direct <0.4 (L) 0.1 - 0.5 mg/dL   Indirect Bilirubin  NOT CALCULATED 0.3 - 0.9 mg/dL  Lipase, blood  Result Value Ref Range   Lipase 43 11 - 51 U/L  Troponin I  Result Value Ref Range   Troponin I <0.03 <0.031 ng/mL      Assessment & Plan:   Problem List Items Addressed This Visit      Unprioritized   Hypertension - Primary    Restart the Metoprolol. Increase Lisinopril to 40 mg.  Written instructions given      Relevant Medications   metoprolol (LOPRESSOR) 50 MG tablet       Follow up plan: Return in about 4 weeks (around 11/02/2015).

## 2015-10-05 NOTE — Patient Instructions (Signed)
Double Lisinopril and take 2 pills/day Take one Metoprolol/day.  Pick this up at the pharmacy

## 2015-10-10 ENCOUNTER — Other Ambulatory Visit: Payer: Self-pay | Admitting: Unknown Physician Specialty

## 2015-10-26 ENCOUNTER — Emergency Department
Admission: EM | Admit: 2015-10-26 | Discharge: 2015-10-26 | Disposition: A | Discharge status: Home / Self Care | Payer: Commercial Managed Care - HMO | Attending: Emergency Medicine | Admitting: Emergency Medicine

## 2015-10-26 ENCOUNTER — Encounter: Payer: Self-pay | Admitting: Emergency Medicine

## 2015-10-26 DIAGNOSIS — M545 Low back pain, unspecified: Secondary | ICD-10-CM

## 2015-10-26 DIAGNOSIS — R300 Dysuria: Secondary | ICD-10-CM | POA: Diagnosis not present

## 2015-10-26 DIAGNOSIS — Z7982 Long term (current) use of aspirin: Secondary | ICD-10-CM | POA: Insufficient documentation

## 2015-10-26 DIAGNOSIS — I10 Essential (primary) hypertension: Secondary | ICD-10-CM | POA: Diagnosis not present

## 2015-10-26 DIAGNOSIS — Z79899 Other long term (current) drug therapy: Secondary | ICD-10-CM | POA: Diagnosis not present

## 2015-10-26 DIAGNOSIS — F17210 Nicotine dependence, cigarettes, uncomplicated: Secondary | ICD-10-CM | POA: Insufficient documentation

## 2015-10-26 LAB — CBC WITH DIFFERENTIAL/PLATELET
BASOS PCT: 1 %
Basophils Absolute: 0.1 10*3/uL (ref 0–0.1)
EOS ABS: 0.3 10*3/uL (ref 0–0.7)
Eosinophils Relative: 3 %
HCT: 42.1 % (ref 40.0–52.0)
HEMOGLOBIN: 14.1 g/dL (ref 13.0–18.0)
Lymphocytes Relative: 36 %
Lymphs Abs: 3.5 10*3/uL (ref 1.0–3.6)
MCH: 31.8 pg (ref 26.0–34.0)
MCHC: 33.5 g/dL (ref 32.0–36.0)
MCV: 94.9 fL (ref 80.0–100.0)
MONOS PCT: 8 %
Monocytes Absolute: 0.8 10*3/uL (ref 0.2–1.0)
NEUTROS PCT: 52 %
Neutro Abs: 5 10*3/uL (ref 1.4–6.5)
Platelets: 405 10*3/uL (ref 150–440)
RBC: 4.44 MIL/uL (ref 4.40–5.90)
RDW: 14.5 % (ref 11.5–14.5)
WBC: 9.6 10*3/uL (ref 3.8–10.6)

## 2015-10-26 LAB — URINALYSIS COMPLETE WITH MICROSCOPIC (ARMC ONLY)
BACTERIA UA: NONE SEEN
Bilirubin Urine: NEGATIVE
Glucose, UA: NEGATIVE mg/dL
HGB URINE DIPSTICK: NEGATIVE
Ketones, ur: NEGATIVE mg/dL
LEUKOCYTES UA: NEGATIVE
Nitrite: NEGATIVE
PH: 6 (ref 5.0–8.0)
Protein, ur: NEGATIVE mg/dL
SQUAMOUS EPITHELIAL / LPF: NONE SEEN
Specific Gravity, Urine: 1.011 (ref 1.005–1.030)

## 2015-10-26 LAB — COMPREHENSIVE METABOLIC PANEL
ALBUMIN: 4.3 g/dL (ref 3.5–5.0)
ALK PHOS: 89 U/L (ref 38–126)
ALT: 23 U/L (ref 17–63)
ANION GAP: 4 — AB (ref 5–15)
AST: 25 U/L (ref 15–41)
BUN: 14 mg/dL (ref 6–20)
CALCIUM: 9.2 mg/dL (ref 8.9–10.3)
CHLORIDE: 110 mmol/L (ref 101–111)
CO2: 25 mmol/L (ref 22–32)
Creatinine, Ser: 1.14 mg/dL (ref 0.61–1.24)
GFR calc Af Amer: >60 mL/min (ref >60)
GFR calc non Af Amer: >60 mL/min (ref >60)
GLUCOSE: 102 mg/dL — AB (ref 65–99)
Potassium: 4.1 mmol/L (ref 3.5–5.1)
SODIUM: 139 mmol/L (ref 135–145)
Total Bilirubin: 0.3 mg/dL (ref 0.3–1.2)
Total Protein: 7.5 g/dL (ref 6.5–8.1)

## 2015-10-26 MED ORDER — NAPROXEN 500 MG PO TABS: 500.0000 mg | ORAL_TABLET | Freq: Two times a day (BID) | ORAL | Status: DC

## 2015-10-26 MED ORDER — OXYCODONE-ACETAMINOPHEN 5-325 MG PO TABS
2.0000 | ORAL_TABLET | Freq: Once | ORAL | Status: AC
Start: 2015-10-26 — End: 2015-10-26
  Administered 2015-10-26: 2 via ORAL
  Filled 2015-10-26: qty 2

## 2015-10-26 MED ORDER — OXYCODONE-ACETAMINOPHEN 5-325 MG PO TABS: 1.0000 | ORAL_TABLET | Freq: Four times a day (QID) | ORAL | Status: DC | PRN

## 2015-10-26 NOTE — ED Notes (Signed)
C/o bilat flank pain and burning with urination.

## 2015-10-26 NOTE — ED Notes (Signed)
Pt reports pain in entire lower back and right side - Pt reports last time he had this pain he had a kidney stone and a kidney infection - Reports "stings when going to the bathroom" - Pt reports having difficulty voiding for the last few weeks

## 2015-10-26 NOTE — ED Notes (Signed)
Pt unable to void at this time. Given cup for when able to urinate.

## 2015-10-26 NOTE — Discharge Instructions (Signed)
You were prescribed a medication that is potentially sedating. Do not drink alcohol, drive or participate in any other potentially dangerous activities while taking this medication as it may make you sleepy. Do not take this medication with any other sedating medications, either prescription or over-the-counter. If you were prescribed Percocet or Vicodin, do not take these with acetaminophen (Tylenol) as it is already contained within these medications. °  °Opioid pain medications (or "narcotics") can be habit forming.  Use it as little as possible to achieve adequate pain control.  Do not use or use it with extreme caution if you have a history of opiate abuse or dependence.  If you are on a pain contract with your primary care doctor or a pain specialist, be sure to let them know you were prescribed this medication today from the [facility].  This medication is intended for your use only - do not give any to anyone else and keep it in a secure place where nobody else, especially children and pets, have access to it.  It will also cause or worsen constipation, so you may want to consider taking an over-the-counter stool softener while you are taking this medication. ° °Back Pain, Adult °Back pain is very common in adults. The cause of back pain is rarely dangerous and the pain often gets better over time. The cause of your back pain may not be known. Some common causes of back pain include: °· Strain of the muscles or ligaments supporting the spine. °· Wear and tear (degeneration) of the spinal disks. °· Arthritis. °· Direct injury to the back. °For many people, back pain may return. Since back pain is rarely dangerous, most people can learn to manage this condition on their own. °HOME CARE INSTRUCTIONS °Watch your back pain for any changes. The following actions may help to lessen any discomfort you are feeling: °· Remain active. It is stressful on your back to sit or stand in one place  for long periods of time. Do not sit, drive, or stand in one place for more than 30 minutes at a time. Take short walks on even surfaces as soon as you are able. Try to increase the length of time you walk each day. °· Exercise regularly as directed by your health care provider. Exercise helps your back heal faster. It also helps avoid future injury by keeping your muscles strong and flexible. °· Do not stay in bed. Resting more than 1-2 days can delay your recovery. °· Pay attention to your body when you bend and lift. The most comfortable positions are those that put less stress on your recovering back. Always use proper lifting techniques, including: °¨ Bending your knees. °¨ Keeping the load close to your body. °¨ Avoiding twisting. °· Find a comfortable position to sleep. Use a firm mattress and lie on your side with your knees slightly bent. If you lie on your back, put a pillow under your knees. °· Avoid feeling anxious or stressed. Stress increases muscle tension and can worsen back pain. It is important to recognize when you are anxious or stressed and learn ways to manage it, such as with exercise. °· Take medicines only as directed by your health care provider. Over-the-counter medicines to reduce pain and inflammation are often the most helpful. Your health care provider may prescribe muscle relaxant drugs. These medicines help dull your pain so you can more quickly return to your normal activities and healthy exercise. °· Apply ice to the injured area: °¨ Put ice in a plastic   bag. °¨ Place a towel between your skin and the bag. °¨ Leave the ice on for 20 minutes, 2-3 times a day for the first 2-3 days. After that, ice and heat may be alternated to reduce pain and spasms. °· Maintain a healthy weight. Excess weight puts extra stress on your back and makes it difficult to maintain good posture. °SEEK MEDICAL CARE IF: °· You have pain that is not relieved with rest or medicine. °· You have increasing pain  going down into the legs or buttocks. °· You have pain that does not improve in one week. °· You have night pain. °· You lose weight. °· You have a fever or chills. °SEEK IMMEDIATE MEDICAL CARE IF:  °· You develop new bowel or bladder control problems. °· You have unusual weakness or numbness in your arms or legs. °· You develop nausea or vomiting. °· You develop abdominal pain. °· You feel faint. °  °This information is not intended to replace advice given to you by your health care provider. Make sure you discuss any questions you have with your health care provider. °  °Document Released: 07/31/2005 Document Revised: 08/21/2014 Document Reviewed: 12/02/2013 °[employer] Interactive Patient Education ©2016 [employer] ° °

## 2015-10-26 NOTE — ED Provider Notes (Signed)
[facility] Emergency Department Provider Note  ____________________________________________  Time seen: 7:00 PM  I have reviewed the triage vital signs and the nursing notes.   HISTORY  Chief Complaint Dysuria    HPI Mark [name] is a 55 y.o. male who complains of bilateral lower back pain today. Feels like previous kidney fact infection that he had. States that this was one month ago actually reviewing the notes shows that was more like 2 months ago. At that time he is evaluated in the emergency department actually found to have urinary retention. He was given a Foley catheter and pain medicine for this.  Patient is still voiding. He states that he is having some difficulty urinating but is passing his urine and emptying his bladder. No vomiting. No fevers or chills. No trauma. No penile discharge or lesions.     Past Medical History  Diagnosis Date  . Hypertension   . Stroke [facility])      Patient Active Problem List   Diagnosis Date Noted  . Hematuria 05/03/2015  . Hemiplegia (HCC) 05/03/2015  . Intestinal obstruction (HCC) 05/03/2015  . 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 Anxiety and depression 05/03/2015  . Hypertension 05/03/2015  . Hyperkalemia 05/03/2015  . 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 CVA (cerebral infarction) 05/03/2015     Past Surgical History  Procedure Laterality Date  . Appendectomy    . Bowel obstruction       Current Outpatient Rx  Name  Route  Sig  Dispense  Refill  . aspirin 325 MG tablet   Oral   Take 325 mg by mouth daily.         . citalopram (CELEXA) 40 MG tablet      TAKE 1 TABLET (40 MG TOTAL) BY MOUTH AT BEDTIME.   90 tablet   1   . HYDROcodone-acetaminophen (NORCO/VICODIN) 5-325 MG tablet   Oral   Take 1 tablet by mouth every 6 (six) hours as needed for moderate pain.   15 tablet   0   . lisinopril (PRINIVIL,ZESTRIL) 20 MG tablet      TAKE 1 TABLET (20 MG TOTAL) BY  MOUTH EVERY MORNING.   30 tablet   6   . LORazepam (ATIVAN) 0.5 MG tablet      TAKE ONE TABLET BY MOUTH TWICE A DAY AS NEEDED   90 tablet   0     Not to exceed 5 additional fills before 01/19/2016 ...   . metoprolol (LOPRESSOR) 50 MG tablet   Oral   Take 1 tablet (50 mg total) by mouth 2 (two) times daily.   90 tablet   1   . naproxen (NAPROSYN) 500 MG tablet   Oral   Take 1 tablet (500 mg total) by mouth 2 (two) times daily with a meal.   20 tablet   0   . oxyCODONE-acetaminophen (ROXICET) 5-325 MG tablet   Oral   Take 1 tablet by mouth every 6 (six) hours as needed for severe pain.   10 tablet   0      Allergies Codeine   Family History  Problem Relation Age of Onset  . Heart disease Father   . Seizures Brother     Social History Social History  Substance Use Topics  . Smoking status: Current Every Day Smoker -- 1.00 packs/day    Types: Cigarettes  . Smokeless tobacco: Never Used  . Alcohol Use: No    Review of Systems  Constitutional:  No fever or chills. No weight changes Eyes:   No blurry vision or double vision.  ENT:   No sore throat.  Cardiovascular:   No chest pain. Respiratory:   No dyspnea or cough. Gastrointestinal:   Negative for abdominal pain, vomiting and diarrhea.  No BRBPR or melena. Genitourinary:   Positive dysuria Musculoskeletal:   Bilateral low back pain.. Skin:   Negative for rash. Neurological:   Negative for headaches, focal weakness or numbness. Psychiatric:  No anxiety or depression.   Endocrine:  No changes in energy or sleep difficulty.  10-point ROS otherwise negative.  ____________________________________________   PHYSICAL EXAM:  VITAL SIGNS: ED Triage Vitals  Enc Vitals Group     BP 10/26/15 1614 193/125 mmHg     Pulse Rate 10/26/15 1614 87     Resp 10/26/15 1614 18     Temp 10/26/15 1614 97.7 F (36.5 C)     Temp src --      SpO2 10/26/15 1614 96 %     Weight 10/26/15 1614 165 lb (74.844 kg)      Height 10/26/15 1614 5\' 5"  (1.651 m)     Head Cir --      Peak Flow --      Pain Score 10/26/15 1615 9     Pain Loc --      Pain Edu? --      Excl. in GC? --     Vital signs reviewed, nursing assessments reviewed.   Constitutional:   Alert and oriented. Well appearing and in no distress. Eyes:   No scleral icterus. No conjunctival pallor. PERRL. EOMI ENT   Head:   Normocephalic and atraumatic.   Nose:   No congestion/rhinnorhea. No septal hematoma   Mouth/Throat:   MMM, no pharyngeal erythema. No peritonsillar mass.    Neck:   No stridor. No SubQ emphysema. No meningismus. Hematological/Lymphatic/Immunilogical:   No cervical lymphadenopathy. Cardiovascular:   RRR. Symmetric bilateral radial and DP pulses.  No murmurs.  Respiratory:   Normal respiratory effort without tachypnea nor retractions. Breath sounds are clear and equal bilaterally. No wheezes/rales/rhonchi. Gastrointestinal:   Soft and nontender. Non distended. There is no CVA tenderness.  No rebound, rigidity, or guarding. No hernias Genitourinary:   Normal genitalia. No inguinal lymphadenopathy. No ulcerations or discharge. Nontender testicles. Musculoskeletal:   Nontender with normal range of motion in all extremities. No joint effusions.  No lower extremity tenderness.  No edema. Straight leg raise negative bilaterally Neurologic:   Normal speech and language.  CN 2-10 normal. Motor grossly intact. No gross focal neurologic deficits are appreciated.  Skin:    Skin is warm, dry and intact. No rash noted.  No petechiae, purpura, or bullae. Psychiatric:   Mood and affect are normal. ____________________________________________    LABS (pertinent positives/negatives) (all labs ordered are listed, but only abnormal results are displayed) Labs Reviewed  URINALYSIS COMPLETEWITH MICROSCOPIC ([facility] ONLY) - Abnormal; Notable for the following:    Color, Urine YELLOW (*)    APPearance CLEAR (*)    All other  components within normal limits  COMPREHENSIVE METABOLIC PANEL - Abnormal; Notable for the following:    Glucose, Bld 102 (*)    Anion gap 4 (*)    All other components within normal limits  CBC WITH DIFFERENTIAL/PLATELET   ____________________________________________   EKG    ____________________________________________    RADIOLOGY    ____________________________________________   PROCEDURES   ____________________________________________   INITIAL IMPRESSION / ASSESSMENT AND PLAN / ED  COURSE  Pertinent labs & imaging results that were available during my care of the patient were reviewed by me and considered in my medical decision making (see chart for details).  Well-appearing no acute distress. Workup negative. History of previous kidney infection is actually incorrect. No evidence of urinary obstruction or infection today. Labs are essentially unremarkable. Vital signs unremarkable except for elevated blood pressure which is likely secondary to chronic hypertension and the acute stress of his pain. We'll give him a short course of Percocet and restart NSAIDs and have him follow up closely with primary care..     ____________________________________________   FINAL CLINICAL IMPRESSION(S) / ED DIAGNOSES  Final diagnoses:  Bilateral low back pain without sciatica      Sharman Cheek, MD 10/26/15 1919

## 2015-11-09 ENCOUNTER — Other Ambulatory Visit: Payer: Self-pay | Admitting: Unknown Physician Specialty

## 2015-11-12 ENCOUNTER — Other Ambulatory Visit: Payer: Self-pay | Admitting: Unknown Physician Specialty

## 2015-12-08 ENCOUNTER — Encounter: Payer: Self-pay | Admitting: Unknown Physician Specialty

## 2015-12-08 ENCOUNTER — Ambulatory Visit (INDEPENDENT_AMBULATORY_CARE_PROVIDER_SITE_OTHER): Payer: Commercial Managed Care - HMO | Admitting: Unknown Physician Specialty

## 2015-12-08 VITALS — BP 184/125 | HR 73 | Temp 98.6°F | Ht 65.7 in | Wt 156.4 lb

## 2015-12-08 DIAGNOSIS — M545 Low back pain: Secondary | ICD-10-CM

## 2015-12-08 LAB — UA/M W/RFLX CULTURE, ROUTINE
Bilirubin, UA: NEGATIVE
GLUCOSE, UA: NEGATIVE
KETONES UA: NEGATIVE
LEUKOCYTES UA: NEGATIVE
Nitrite, UA: NEGATIVE
Protein, UA: NEGATIVE
RBC, UA: NEGATIVE
Specific Gravity, UA: 1.015 (ref 1.005–1.030)
Urobilinogen, Ur: 0.2 mg/dL (ref 0.2–1.0)
pH, UA: 5.5 (ref 5.0–7.5)

## 2015-12-08 MED ORDER — CYCLOBENZAPRINE HCL 10 MG PO TABS: 10.0000 mg | ORAL_TABLET | Freq: Three times a day (TID) | ORAL | Status: DC | PRN

## 2015-12-08 MED ORDER — NAPROXEN 500 MG PO TABS: 500.0000 mg | ORAL_TABLET | Freq: Two times a day (BID) | ORAL | Status: DC

## 2015-12-08 MED ORDER — TRAMADOL HCL 50 MG PO TABS: 50.0000 mg | ORAL_TABLET | Freq: Three times a day (TID) | ORAL | Status: DC | PRN

## 2015-12-08 NOTE — Progress Notes (Signed)
BP 184/125 mmHg  Pulse 73  Temp(Src) 98.6 F (37 C)  Ht 5' 5.7" (1.669 m)  Wt 156 lb 6.4 oz (70.943 kg)  BMI 25.47 kg/m2  SpO2 97%   Subjective:    Patient ID: Mark [name], male    DOB: Mar 30, 1961, 55 y.o.   MRN: [id number]  HPI: Mark [name] is a 55 y.o. male  Chief Complaint  Patient presents with  . Back Pain    pt states his lower back has been hurting for about a week and a half. states he thinks he hurt it while helping a friend cut wood   Back Pain This is a new problem. The current episode started 1 to 4 weeks ago. The problem occurs constantly. The problem is unchanged. The pain is present in the lumbar spine. Quality: sharp. The pain does not radiate. The pain is at a severity of 6/10. The pain is moderate. The pain is the same all the time. The symptoms are aggravated by bending and twisting. Pertinent negatives include no abdominal pain, bladder incontinence, chest pain, fever, headaches, leg pain, numbness or tingling. He has tried analgesics and NSAIDs (Been using Salonpas patch) for the symptoms. The treatment provided no relief.  Patient not sleeping at night due to pain.   Relevant past medical, surgical, family and social history reviewed and updated as indicated. Interim medical history since our last visit reviewed. Allergies and medications reviewed and updated.  Review of Systems  Constitutional: Negative for fever.  Cardiovascular: Negative for chest pain.  Gastrointestinal: Negative for abdominal pain.  Genitourinary: Negative for bladder incontinence.  Musculoskeletal: Positive for back pain.  Neurological: Negative for tingling, numbness and headaches.    Per HPI unless specifically indicated above     Objective:    BP 184/125 mmHg  Pulse 73  Temp(Src) 98.6 F (37 C)  Ht 5' 5.7" (1.669 m)  Wt 156 lb 6.4 oz (70.943 kg)  BMI 25.47 kg/m2  SpO2 97%  Wt Readings from Last 3 Encounters:  12/08/15 156 lb 6.4 oz (70.943 kg)  10/26/15  165 lb (74.844 kg)  10/05/15 155 lb 12.8 oz (70.67 kg)    Physical Exam  Constitutional: He is oriented to person, place, and time. He appears well-developed and well-nourished. No distress.  HENT:  Head: Normocephalic and atraumatic.  Eyes: Conjunctivae and lids are normal. Right eye exhibits no discharge. Left eye exhibits no discharge. No scleral icterus.  Neck: Normal range of motion. Neck supple. No JVD present. Carotid bruit is not present.  Cardiovascular: Normal rate, regular rhythm and normal heart sounds.   Pulmonary/Chest: Effort normal and breath sounds normal. No respiratory distress.  Abdominal: Normal appearance. There is no splenomegaly or hepatomegaly.  Musculoskeletal:       Lumbar back: He exhibits decreased range of motion, tenderness and pain.  Neurological: He is alert and oriented to person, place, and time.  Skin: Skin is warm, dry and intact. No rash noted. No pallor.  Psychiatric: He has a normal mood and affect. His behavior is normal. Judgment and thought content normal.        Assessment & Plan:   Problem List Items Addressed This Visit    None    Visit Diagnoses    Low back pain, unspecified back pain laterality, with sciatica presence unspecified    -  Primary    Start Naproxen daily. Flexeril as needed at bedtime for back pain. Tramadol as needed for severe back pain. Referral to  physical therapy given.     Relevant Medications    naproxen (NAPROSYN) 500 MG tablet    cyclobenzaprine (FLEXERIL) 10 MG tablet    traMADol (ULTRAM) 50 MG tablet    Other Relevant Orders    UA/M w/rflx Culture, Routine    Ambulatory referral to Physical Therapy        Follow up plan: Return if symptoms worsen or fail to improve.

## 2015-12-12 ENCOUNTER — Other Ambulatory Visit: Payer: Self-pay | Admitting: Unknown Physician Specialty

## 2015-12-13 NOTE — Telephone Encounter (Signed)
He should have refills.  If not, he is taking too many

## 2015-12-14 NOTE — Telephone Encounter (Signed)
Patient has appointment scheduled for 12/20/15 for med f/u.

## 2015-12-17 ENCOUNTER — Telehealth: Payer: Self-pay | Admitting: Unknown Physician Specialty

## 2015-12-17 MED ORDER — LORAZEPAM 0.5 MG PO TABS: 0.5000 mg | ORAL_TABLET | Freq: Three times a day (TID) | ORAL | Status: DC | PRN

## 2015-12-17 NOTE — Telephone Encounter (Signed)
Routing to provider  

## 2015-12-17 NOTE — Telephone Encounter (Signed)
Pt would like a refill for lorazepam. I notified the pt that he would be on the 28 day program and it starts today. I also notified the pt it would have to be picked up.

## 2015-12-20 ENCOUNTER — Encounter: Payer: Self-pay | Admitting: Unknown Physician Specialty

## 2015-12-20 ENCOUNTER — Ambulatory Visit (INDEPENDENT_AMBULATORY_CARE_PROVIDER_SITE_OTHER): Payer: Commercial Managed Care - HMO | Admitting: Unknown Physician Specialty

## 2015-12-20 VITALS — BP 138/98 | HR 77 | Ht 65.9 in | Wt 153.6 lb

## 2015-12-20 DIAGNOSIS — F418 Other specified anxiety disorders: Secondary | ICD-10-CM

## 2015-12-20 DIAGNOSIS — I10 Essential (primary) hypertension: Secondary | ICD-10-CM

## 2015-12-20 DIAGNOSIS — F419 Anxiety disorder, unspecified: Principal | ICD-10-CM

## 2015-12-20 DIAGNOSIS — F329 Major depressive disorder, single episode, unspecified: Secondary | ICD-10-CM

## 2015-12-20 NOTE — Assessment & Plan Note (Signed)
Will put him on a 28 day rx of Lorazepam to be renewed monthly with no early refills

## 2015-12-20 NOTE — Progress Notes (Signed)
   BP 138/98 mmHg  Pulse 77  Ht 5' 5.9" (1.674 m)  Wt 153 lb 9.6 oz (69.673 kg)  BMI 24.86 kg/m2  SpO2 98%   Subjective:    Patient ID: Mark [name], male    DOB: [birth year], 55 y.o.   MRN: [id number]  HPI: Mark BandaRodney D Piotrowski is a 55 y.o. male  Chief Complaint  Patient presents with  . Medication f/u    pt is here to f/u on Lorazepam   Anxiety Taking Lorazepam .5 mg BID.  He has been on this for quite some time.    Hypertension Using medications without difficulty Average home BPs "under 100"     No problems or lightheadedness No chest pain with exertion or shortness of breath No Edema   Relevant past medical, surgical, family and social history reviewed and updated as indicated. Interim medical history since our last visit reviewed. Allergies and medications reviewed and updated.  Review of Systems  Musculoskeletal:       "My back is fine."    Per HPI unless specifically indicated above     Objective:    BP 138/98 mmHg  Pulse 77  Ht 5' 5.9" (1.674 m)  Wt 153 lb 9.6 oz (69.673 kg)  BMI 24.86 kg/m2  SpO2 98%  Wt Readings from Last 3 Encounters:  12/20/15 153 lb 9.6 oz (69.673 kg)  12/08/15 156 lb 6.4 oz (70.943 kg)  10/26/15 165 lb (74.844 kg)    Physical Exam  Constitutional: He is oriented to person, place, and time. He appears well-developed and well-nourished. No distress.  HENT:  Head: Normocephalic and atraumatic.  Eyes: Conjunctivae and lids are normal. Right eye exhibits no discharge. Left eye exhibits no discharge. No scleral icterus.  Neck: Normal range of motion. Neck supple. No JVD present. Carotid bruit is not present.  Cardiovascular: Normal rate, regular rhythm and normal heart sounds.   Pulmonary/Chest: Effort normal and breath sounds normal. No respiratory distress.  Abdominal: Normal appearance. There is no splenomegaly or hepatomegaly.  Musculoskeletal: Normal range of motion.  Neurological: He is alert and oriented to person,  place, and time.  Skin: Skin is warm, dry and intact. No rash noted. No pallor.  Psychiatric: He has a normal mood and affect. His behavior is normal. Judgment and thought content normal.    Results for orders placed or performed in visit on 12/08/15  UA/M w/rflx Culture, Routine  Result Value Ref Range   Specific Gravity, UA 1.015 1.005 - 1.030   pH, UA 5.5 5.0 - 7.5   Color, UA Yellow Yellow   Appearance Ur Clear Clear   Leukocytes, UA Negative Negative   Protein, UA Negative Negative/Trace   Glucose, UA Negative Negative   Ketones, UA Negative Negative   RBC, UA Negative Negative   Bilirubin, UA Negative Negative   Urobilinogen, Ur 0.2 0.2 - 1.0 mg/dL   Nitrite, UA Negative Negative      Assessment & Plan:   Problem List Items Addressed This Visit      Unprioritized   Anxiety and depression - Primary    Will put him on a 28 day rx of Lorazepam to be renewed monthly with no early refills      Hypertension    Stable, continue present medications.            Follow up plan: Return in about 6 months (around 06/21/2016).

## 2015-12-20 NOTE — Assessment & Plan Note (Signed)
Stable, continue present medications.   

## 2015-12-29 ENCOUNTER — Emergency Department: Payer: Commercial Managed Care - HMO

## 2015-12-29 ENCOUNTER — Inpatient Hospital Stay
Admission: EM | Admit: 2015-12-29 | Discharge: 2015-12-31 | DRG: 918 | Disposition: A | Discharge status: Home / Self Care | Payer: Commercial Managed Care - HMO | Attending: Internal Medicine | Admitting: Internal Medicine

## 2015-12-29 DIAGNOSIS — M545 Low back pain: Secondary | ICD-10-CM | POA: Diagnosis present

## 2015-12-29 DIAGNOSIS — R1084 Generalized abdominal pain: Secondary | ICD-10-CM | POA: Diagnosis not present

## 2015-12-29 DIAGNOSIS — I69920 Aphasia following unspecified cerebrovascular disease: Secondary | ICD-10-CM

## 2015-12-29 DIAGNOSIS — Z7982 Long term (current) use of aspirin: Secondary | ICD-10-CM

## 2015-12-29 DIAGNOSIS — F1111 Opioid abuse, in remission: Secondary | ICD-10-CM

## 2015-12-29 DIAGNOSIS — Z8249 Family history of ischemic heart disease and other diseases of the circulatory system: Secondary | ICD-10-CM

## 2015-12-29 DIAGNOSIS — I10 Essential (primary) hypertension: Secondary | ICD-10-CM | POA: Diagnosis present

## 2015-12-29 DIAGNOSIS — T40601A Poisoning by unspecified narcotics, accidental (unintentional), initial encounter: Secondary | ICD-10-CM | POA: Diagnosis not present

## 2015-12-29 DIAGNOSIS — T391X1A Poisoning by 4-Aminophenol derivatives, accidental (unintentional), initial encounter: Principal | ICD-10-CM | POA: Diagnosis present

## 2015-12-29 DIAGNOSIS — E876 Hypokalemia: Secondary | ICD-10-CM | POA: Diagnosis present

## 2015-12-29 DIAGNOSIS — F17210 Nicotine dependence, cigarettes, uncomplicated: Secondary | ICD-10-CM | POA: Diagnosis present

## 2015-12-29 DIAGNOSIS — Z8489 Family history of other specified conditions: Secondary | ICD-10-CM

## 2015-12-29 DIAGNOSIS — IMO0002 Reserved for concepts with insufficient information to code with codable children: Secondary | ICD-10-CM

## 2015-12-29 DIAGNOSIS — R112 Nausea with vomiting, unspecified: Secondary | ICD-10-CM | POA: Diagnosis present

## 2015-12-29 DIAGNOSIS — Z9119 Patient's noncompliance with other medical treatment and regimen: Secondary | ICD-10-CM

## 2015-12-29 DIAGNOSIS — G8929 Other chronic pain: Secondary | ICD-10-CM | POA: Diagnosis not present

## 2015-12-29 DIAGNOSIS — Z79899 Other long term (current) drug therapy: Secondary | ICD-10-CM

## 2015-12-29 DIAGNOSIS — R400 Somnolence: Secondary | ICD-10-CM | POA: Diagnosis present

## 2015-12-29 DIAGNOSIS — F1110 Opioid abuse, uncomplicated: Secondary | ICD-10-CM | POA: Diagnosis not present

## 2015-12-29 DIAGNOSIS — B179 Acute viral hepatitis, unspecified: Secondary | ICD-10-CM

## 2015-12-29 DIAGNOSIS — K759 Inflammatory liver disease, unspecified: Secondary | ICD-10-CM | POA: Diagnosis not present

## 2015-12-29 DIAGNOSIS — R740 Nonspecific elevation of levels of transaminase and lactic acid dehydrogenase [LDH]: Secondary | ICD-10-CM | POA: Diagnosis not present

## 2015-12-29 DIAGNOSIS — Z885 Allergy status to narcotic agent status: Secondary | ICD-10-CM

## 2015-12-29 DIAGNOSIS — Z8673 Personal history of transient ischemic attack (TIA), and cerebral infarction without residual deficits: Secondary | ICD-10-CM | POA: Diagnosis not present

## 2015-12-29 DIAGNOSIS — R1110 Vomiting, unspecified: Secondary | ICD-10-CM | POA: Diagnosis not present

## 2015-12-29 LAB — COMPREHENSIVE METABOLIC PANEL
ALK PHOS: 104 U/L (ref 38–126)
ALT: 288 U/L — AB (ref 17–63)
AST: 269 U/L — ABNORMAL HIGH (ref 15–41)
Albumin: 3.8 g/dL (ref 3.5–5.0)
Anion gap: 8 (ref 5–15)
BUN: 16 mg/dL (ref 6–20)
CHLORIDE: 109 mmol/L (ref 101–111)
CO2: 21 mmol/L — ABNORMAL LOW (ref 22–32)
CREATININE: 0.87 mg/dL (ref 0.61–1.24)
Calcium: 8.7 mg/dL — ABNORMAL LOW (ref 8.9–10.3)
Glucose, Bld: 98 mg/dL (ref 65–99)
POTASSIUM: 4.9 mmol/L (ref 3.5–5.1)
SODIUM: 138 mmol/L (ref 135–145)
TOTAL PROTEIN: 6.3 g/dL — AB (ref 6.5–8.1)
Total Bilirubin: 1.1 mg/dL (ref 0.3–1.2)

## 2015-12-29 LAB — URINALYSIS COMPLETE WITH MICROSCOPIC (ARMC ONLY)
BACTERIA UA: NONE SEEN
BILIRUBIN URINE: NEGATIVE
Glucose, UA: NEGATIVE mg/dL
HGB URINE DIPSTICK: NEGATIVE
Leukocytes, UA: NEGATIVE
Nitrite: NEGATIVE
PH: 5 (ref 5.0–8.0)
PROTEIN: NEGATIVE mg/dL
Specific Gravity, Urine: 1.026 (ref 1.005–1.030)
Squamous Epithelial / LPF: NONE SEEN

## 2015-12-29 LAB — CBC WITH DIFFERENTIAL/PLATELET
Basophils Absolute: 0.1 10*3/uL (ref 0–0.1)
Basophils Relative: 1 %
Eosinophils Absolute: 0.1 10*3/uL (ref 0–0.7)
Eosinophils Relative: 1 %
HCT: 43.1 % (ref 40.0–52.0)
HEMOGLOBIN: 14.6 g/dL (ref 13.0–18.0)
LYMPHS ABS: 1.7 10*3/uL (ref 1.0–3.6)
Lymphocytes Relative: 18 %
MCH: 31.9 pg (ref 26.0–34.0)
MCHC: 33.9 g/dL (ref 32.0–36.0)
MCV: 94.1 fL (ref 80.0–100.0)
Monocytes Absolute: 0.4 10*3/uL (ref 0.2–1.0)
Monocytes Relative: 4 %
Neutro Abs: 7.2 10*3/uL — ABNORMAL HIGH (ref 1.4–6.5)
Neutrophils Relative %: 76 %
Platelets: 464 10*3/uL — ABNORMAL HIGH (ref 150–440)
RBC: 4.58 MIL/uL (ref 4.40–5.90)
RDW: 14 % (ref 11.5–14.5)
WBC: 9.4 10*3/uL (ref 3.8–10.6)

## 2015-12-29 LAB — SALICYLATE LEVEL: Salicylate Lvl: <4 mg/dL (ref 2.8–30.0)

## 2015-12-29 LAB — ACETAMINOPHEN LEVEL: Acetaminophen (Tylenol), Serum: 37 ug/mL — ABNORMAL HIGH (ref 10–30)

## 2015-12-29 LAB — MRSA PCR SCREENING: MRSA by PCR: NEGATIVE

## 2015-12-29 LAB — URINE DRUG SCREEN, QUALITATIVE (ARMC ONLY)
AMPHETAMINES, UR SCREEN: NOT DETECTED
BARBITURATES, UR SCREEN: NOT DETECTED
BENZODIAZEPINE, UR SCRN: NOT DETECTED
Cannabinoid 50 Ng, Ur ~~LOC~~: NOT DETECTED
Cocaine Metabolite,Ur ~~LOC~~: NOT DETECTED
MDMA (Ecstasy)Ur Screen: NOT DETECTED
METHADONE SCREEN, URINE: NOT DETECTED
OPIATE, UR SCREEN: POSITIVE — AB
PHENCYCLIDINE (PCP) UR S: NOT DETECTED
Tricyclic, Ur Screen: POSITIVE — AB

## 2015-12-29 LAB — ETHANOL: Alcohol, Ethyl (B): <5 mg/dL (ref <5)

## 2015-12-29 LAB — LIPASE, BLOOD: LIPASE: 127 U/L — AB (ref 11–51)

## 2015-12-29 LAB — PROTIME-INR
INR: 0.98
Prothrombin Time: 13.2 seconds (ref 11.4–15.0)

## 2015-12-29 MED ORDER — KETOROLAC TROMETHAMINE 15 MG/ML IJ SOLN
15.0000 mg | Freq: Four times a day (QID) | INTRAMUSCULAR | Status: DC | PRN
Start: 2015-12-29 — End: 2015-12-31
  Administered 2015-12-29 – 2015-12-31 (×7): 15 mg via INTRAVENOUS
  Filled 2015-12-29 (×7): qty 1

## 2015-12-29 MED ORDER — HYDRALAZINE HCL 20 MG/ML IJ SOLN
10.0000 mg | INTRAMUSCULAR | Status: DC | PRN
  Administered 2015-12-29 – 2015-12-30 (×3): 10 mg via INTRAVENOUS
  Filled 2015-12-29 (×3): qty 1

## 2015-12-29 MED ORDER — ENOXAPARIN SODIUM 40 MG/0.4ML ~~LOC~~ SOLN
40.0000 mg | SUBCUTANEOUS | Status: DC
  Administered 2015-12-29 – 2015-12-30 (×2): 40 mg via SUBCUTANEOUS
  Filled 2015-12-29 (×2): qty 0.4

## 2015-12-29 MED ORDER — ONDANSETRON HCL 4 MG/2ML IJ SOLN
4.0000 mg | Freq: Once | INTRAMUSCULAR | Status: AC
  Administered 2015-12-29: 4 mg via INTRAVENOUS
  Filled 2015-12-29: qty 2

## 2015-12-29 MED ORDER — ACETYLCYSTEINE LOAD VIA INFUSION
150.0000 mg/kg | Freq: Once | INTRAVENOUS | Status: AC
  Administered 2015-12-29: 11220 mg via INTRAVENOUS
  Filled 2015-12-29: qty 281

## 2015-12-29 MED ORDER — DIATRIZOATE MEGLUMINE & SODIUM 66-10 % PO SOLN
15.0000 mL | Freq: Once | ORAL | Status: AC
Start: 2015-12-29 — End: 2015-12-29
  Administered 2015-12-29: 15 mL via ORAL

## 2015-12-29 MED ORDER — ONDANSETRON HCL 4 MG PO TABS
4.0000 mg | ORAL_TABLET | Freq: Four times a day (QID) | ORAL | Status: DC | PRN
  Filled 2015-12-29: qty 1

## 2015-12-29 MED ORDER — DEXTROSE 5 % IV SOLN
15.0000 mg/kg/h | INTRAVENOUS | Status: DC
  Administered 2015-12-29: 15 mg/kg/h via INTRAVENOUS
  Filled 2015-12-29: qty 200

## 2015-12-29 MED ORDER — IOPAMIDOL (ISOVUE-300) INJECTION 61%
100.0000 mL | Freq: Once | INTRAVENOUS | Status: AC | PRN
  Administered 2015-12-29: 100 mL via INTRAVENOUS

## 2015-12-29 MED ORDER — LISINOPRIL 20 MG PO TABS
20.0000 mg | ORAL_TABLET | Freq: Every day | ORAL | Status: DC
  Administered 2015-12-29 – 2015-12-31 (×3): 20 mg via ORAL
  Filled 2015-12-29 (×2): qty 1
  Filled 2015-12-29: qty 2

## 2015-12-29 MED ORDER — HYDRALAZINE HCL 20 MG/ML IJ SOLN
10.0000 mg | Freq: Four times a day (QID) | INTRAMUSCULAR | Status: DC | PRN
  Administered 2015-12-29: 10 mg via INTRAVENOUS
  Filled 2015-12-29: qty 1

## 2015-12-29 MED ORDER — CLONIDINE HCL 0.1 MG PO TABS
0.2000 mg | ORAL_TABLET | Freq: Once | ORAL | Status: AC
  Administered 2015-12-29: 0.2 mg via ORAL
  Filled 2015-12-29: qty 2

## 2015-12-29 MED ORDER — DIPHENHYDRAMINE HCL 50 MG/ML IJ SOLN
50.0000 mg | Freq: Once | INTRAMUSCULAR | Status: AC
  Administered 2015-12-29: 50 mg via INTRAVENOUS
  Filled 2015-12-29: qty 1

## 2015-12-29 MED ORDER — ASPIRIN 325 MG PO TABS
325.0000 mg | ORAL_TABLET | Freq: Every day | ORAL | Status: DC
  Administered 2015-12-29 – 2015-12-31 (×3): 325 mg via ORAL
  Filled 2015-12-29 (×3): qty 1

## 2015-12-29 MED ORDER — MORPHINE SULFATE (PF) 4 MG/ML IV SOLN
4.0000 mg | Freq: Once | INTRAVENOUS | Status: AC
  Administered 2015-12-29: 4 mg via INTRAVENOUS
  Filled 2015-12-29: qty 1

## 2015-12-29 MED ORDER — SODIUM CHLORIDE 0.9 % IV SOLN
INTRAVENOUS | Status: DC
  Administered 2015-12-29 – 2015-12-31 (×4): via INTRAVENOUS

## 2015-12-29 MED ORDER — KETOROLAC TROMETHAMINE 30 MG/ML IJ SOLN
INTRAMUSCULAR | Status: AC
  Administered 2015-12-29: 15 mg
  Filled 2015-12-29: qty 1

## 2015-12-29 MED ORDER — METOPROLOL TARTRATE 50 MG PO TABS
50.0000 mg | ORAL_TABLET | Freq: Two times a day (BID) | ORAL | Status: DC
  Administered 2015-12-29 – 2015-12-31 (×5): 50 mg via ORAL
  Filled 2015-12-29 (×5): qty 1

## 2015-12-29 MED ORDER — CITALOPRAM HYDROBROMIDE 20 MG PO TABS
40.0000 mg | ORAL_TABLET | Freq: Every day | ORAL | Status: DC
  Administered 2015-12-29 – 2015-12-30 (×2): 40 mg via ORAL
  Filled 2015-12-29 (×2): qty 2

## 2015-12-29 MED ORDER — LORAZEPAM 0.5 MG PO TABS: 0.5000 mg | ORAL_TABLET | Freq: Three times a day (TID) | ORAL | Status: DC | PRN

## 2015-12-29 MED ORDER — SODIUM CHLORIDE 0.9 % IV BOLUS (SEPSIS)
2000.0000 mL | Freq: Once | INTRAVENOUS | Status: AC
  Administered 2015-12-29: 2000 mL via INTRAVENOUS

## 2015-12-29 MED ORDER — ONDANSETRON HCL 4 MG/2ML IJ SOLN
4.0000 mg | Freq: Four times a day (QID) | INTRAMUSCULAR | Status: DC | PRN
  Administered 2015-12-29 – 2015-12-31 (×5): 4 mg via INTRAVENOUS
  Filled 2015-12-29 (×5): qty 2

## 2015-12-29 NOTE — ED Notes (Signed)
Pt returned form CT, resting in bed in no distress

## 2015-12-29 NOTE — ED Notes (Addendum)
Pt lying in bed, eyes closed, moaning, pt denies any use of drugs or ETOH

## 2015-12-29 NOTE — ED Notes (Signed)
Lab called concerning pending blood work, per lab blood work pending and should be finished soon

## 2015-12-29 NOTE — ED Notes (Signed)
Pt up and ambulatory to restroom. Pt is having dry heaves at this time and reports feeling nauseous but is not activly vomiting at this time.

## 2015-12-29 NOTE — ED Notes (Signed)
Pt resting in bed, asking for pain meds, MD aware

## 2015-12-29 NOTE — H&P (Signed)
[facility] - [place] at [facility]   PATIENT NAME: Mark [name]    MR#:  [id number]  DATE OF BIRTH:  [birth year]  DATE OF ADMISSION:  12/29/2015  PRIMARY CARE PHYSICIAN: Gabriel Cirri, NP   REQUESTING/REFERRING PHYSICIAN: dr Scotty Court  CHIEF COMPLAINT:  Nausea vomiting for 3 days.  HISTORY OF PRESENT ILLNESS:  Mark [name]  is a 55 y.o. male with a known history ofAnxiety, hypertension, history of stroke, chronic low back pain, history of street drug use per patient's family comes to the emergency room with nausea vomiting for 3 days. Patient has extensive history of some abdominal surgery and according to his father had about 17 surgeries on his abdomen since he was a small boy. Father is unable to tell me the exact diagnosis. Patient apparently has been using significant amount of Tylenol at home he is not able to quantitate the amount he is taking. Patient was found to have Tylenol level of 37 and elevated LFTs. He is being admitted with Tylenol toxicity. Family suspecting patient is using street drugs however patient is denying using any narcotics although his urine drug screen is positive for opiates.  I spoke with poison control and recommendations have been initiated.  PAST MEDICAL HISTORY:   Past Medical History  Diagnosis Date  . Hypertension   . Stroke [facility])     PAST SURGICAL HISTOIRY:   Past Surgical History  Procedure Laterality Date  . Appendectomy    . Bowel obstruction      SOCIAL HISTORY:   Social History  Substance Use Topics  . Smoking status: Current Every Day Smoker -- 1.00 packs/day    Types: Cigarettes  . Smokeless tobacco: Never Used  . Alcohol Use: No    FAMILY HISTORY:   Family History  Problem Relation Age of Onset  . Heart disease Father   . Seizures Brother     DRUG ALLERGIES:   Allergies  Allergen Reactions  . Codeine Hives    REVIEW OF SYSTEMS:  Review of Systems  Constitutional: Positive  for malaise/fatigue. Negative for fever, chills and weight loss.  HENT: Negative for ear discharge, ear pain and nosebleeds.   Eyes: Negative for blurred vision, pain and discharge.  Respiratory: Negative for sputum production, shortness of breath, wheezing and stridor.   Cardiovascular: Negative for chest pain, palpitations, orthopnea and PND.  Gastrointestinal: Positive for nausea, vomiting and abdominal pain. Negative for diarrhea.  Genitourinary: Negative for urgency and frequency.  Musculoskeletal: Negative for back pain and joint pain.  Neurological: Positive for weakness. Negative for sensory change, speech change and focal weakness.  Psychiatric/Behavioral: Negative for depression and hallucinations. The patient is not nervous/anxious.   All other systems reviewed and are negative.    MEDICATIONS AT HOME:   Prior to Admission medications   Medication Sig Start Date End Date Taking? Authorizing Provider  aspirin 325 MG tablet Take 325 mg by mouth daily.   Yes Historical Provider, MD  citalopram (CELEXA) 40 MG tablet Take 40 mg by mouth at bedtime.   Yes Historical Provider, MD  lisinopril (PRINIVIL,ZESTRIL) 20 MG tablet Take 20 mg by mouth daily.   Yes Historical Provider, MD  LORazepam (ATIVAN) 0.5 MG tablet Take 0.5 mg by mouth every 8 (eight) hours as needed for anxiety.   Yes Historical Provider, MD  metoprolol (LOPRESSOR) 50 MG tablet Take 1 tablet (50 mg total) by mouth 2 (two) times daily. 10/05/15  Yes Gabriel Cirri, NP      VITAL  SIGNS:  Blood pressure 186/119, pulse 79, temperature 97.7 F (36.5 C), temperature source Oral, resp. rate 13, height  (1.702 m), weight 74.844 kg (165 lb), SpO2 97 %.  PHYSICAL EXAMINATION:  GENERAL:  55 y.o.-year-old patient lying in the bed with no acute distress. Appears chronically ill. EYES: Pupils equal, round, reactive to light and accommodation. No scleral icterus. Extraocular muscles intact.  HEENT: Head atraumatic,  normocephalic. Oropharynx and nasopharynx clear.  NECK:  Supple, no jugular venous distention. No thyroid enlargement, no tenderness.  LUNGS: Normal breath sounds bilaterally, no wheezing, rales,rhonchi or crepitation. No use of accessory muscles of respiration.  CARDIOVASCULAR: S1, S2 normal. No murmurs, rubs, or gallops.  ABDOMEN: Soft, nontender, nondistended. Bowel sounds present. No organomegaly or mass.  EXTREMITIES: No pedal edema, cyanosis, or clubbing.  NEUROLOGIC: Cranial nerves II through XII are intact. Muscle strength 5/5 in all extremities. Sensation intact. Gait not checked.  PSYCHIATRIC: The patient is alert and oriented x 3.  SKIN: No obvious rash, lesion, or ulcer.   LABORATORY PANEL:   CBC  Recent Labs Lab 12/29/15 0848  WBC 9.4  HGB 14.6  HCT 43.1  PLT 464*   ------------------------------------------------------------------------------------------------------------------  Chemistries   Recent Labs Lab 12/29/15 1017  NA 138  K 4.9  CL 109  CO2 21*  GLUCOSE 98  BUN 16  CREATININE 0.87  CALCIUM 8.7*  AST 269*  ALT 288*  ALKPHOS 104  BILITOT 1.1   ------------------------------------------------------------------------------------------------------------------  Cardiac Enzymes No results for input(s): TROPONINI in the last 168 hours. ------------------------------------------------------------------------------------------------------------------  RADIOLOGY:  Ct Abdomen Pelvis W Contrast  12/29/2015  CLINICAL DATA:  Abdominal pain with nausea and vomiting 3 days. Previous appendectomy. EXAM: CT ABDOMEN AND PELVIS WITH CONTRAST TECHNIQUE: Multidetector CT imaging of the abdomen and pelvis was performed using the standard protocol following bolus administration of intravenous contrast. CONTRAST:  ISOVUE-300 IOPAMIDOL (ISOVUE-300) INJECTION 61% COMPARISON:  09/13/2015 FINDINGS: Lung bases are within normal. Mild calcified plaque over the coronary  arteries. Mild dilatation of the ascending thoracic aorta measuring 3.8 cm in AP diameter. Abdominal images demonstrate minimal calcification along the liver capsule unchanged. Surgical clips over the right upper quadrant compatible previous cholecystectomy. Stable post cholecystectomy dilatation of the common bile duct. Lung field border of the spleen with a few peripheral calcifications unchanged. The liver, pancreas and adrenal glands are normal. Kidneys normal size without hydronephrosis possible 2 mm calcification over the upper pole right kidney. There are a few sub cm renal cortical hypodensities bilaterally likely cyst but too small to characterize. Ureters are within normal. There is calcified plaque over the abdominal aorta and iliac arteries. Multiple surgical clips over the right colon. Remainder of the colon is within normal. Small bowel is unremarkable. Stomach is within normal. There is no free fluid or focal inflammatory change. Pelvic images demonstrate the bladder, prostate and rectum to be within normal. Minimal degenerative change of the spine with moderate disc disease at the L5-S1 level. IMPRESSION: No acute findings in the abdomen/pelvis. Postsurgical changes as described. Possible 2 mm right renal stone. Bilateral sub cm renal cortical hypodensities which are too small to characterize but likely cysts. Atherosclerotic coronary artery disease. Electronically Signed   By: Elberta Fortis M.D.   On: 12/29/2015 13:55    EKG:    IMPRESSION AND PLAN:   Mark [name]  is a 55 y.o. male with a known history ofAnxiety, hypertension, history of stroke, chronic low back pain, history of street drug use per patient's family comes  to the emergency room with nausea vomiting for 3 days. Patient has extensive history of some abdominal surgery and according to his father had about 17 surgeries on his abdomen since he was a small boy. Father is unable to tell me the exact diagnosis. Patient  apparently has been using significant amount of Tylenol at home he is not able to quantitate the amount he is taking.   1. Tylenol toxicity acute due to using excessive amount of Tylenol at home patient unable to quantitate -Admit to stepdown ICU -Discussed case with poison control. Recommend Mucomyst IV per protocol -LFTs, PT/INR and Tylenol level to be drawn tomorrow according to protocol -GI consultation if needed  2. Nausea vomiting -Aggressive IV fluids -When necessary IV Zofran  3. Malignant hypertension continue home meds -IV hydralazine and needed if systolic greater than 180  4. Chronic pain medication use -Patient is not on any prescription narcotics although per family he has been dealing with people using street drugs -Patient denies it. His urine drug screen is positive for opiates and tricyclic antidepressants -Patient uses lorazepam which is prescribed through his primary care -I will hold off on getting any IV or oral narcotics at present. We'll use IV Toradol if needed -Psychiatry consultation given overuse off pain meds and substance abuse  5. Multiple abdominal surgeries in the past patella do not known   6. DVT prophylaxis subcutaneous Lovenox  Discussed with father in the ER All the records are reviewed and case discussed with ED provider. Management plans discussed with the patient, family and they are in agreement.  CODE STFULL  TOTAL TIME TAKING CARE OF THIS PATIENT:  50 minutes.    Madi Bonfiglio M.D on 12/29/2015 at 4:16 PM  Between 7am to 6pm - Pager - [phone]  After 6pm go to [email or web address] - password [email or web address] [facility]  [facility]  Office  [phone]  CC: Primary care physician; Gabriel Cirriheryl Wicker, NP

## 2015-12-29 NOTE — ED Notes (Signed)
EDP at bedside  

## 2015-12-29 NOTE — ED Provider Notes (Signed)
[facility] Emergency Department Provider Note  ____________________________________________  Time seen: 8:45 AM  I have reviewed the triage vital signs and the nursing notes.   HISTORY  Chief Complaint Abdominal Pain History obtained from patient and father at bedside   HPI Mark [name] is a 55 y.o. male who complains of generalized abdominal pain with nausea and vomiting for the past 3 days. Pain is constant, waxing and waning and cramping. Moderate intensity. No aggravating or alleviating factors. He has a complicated surgical history, including a [facility] day [facility]. His father reports that he's had a total of 17 abdominal surgeries over time. Patient reports his last bowel movement was yesterday and normal. Patient denies any other symptoms other than the abdominal pain nausea and vomiting. No diarrhea. Has been unable to eat for the last 3 days. He reports that he smokes, but denies any alcohol or drug use now or in the past.  With Discussion with the father outside of the room, he indicates that he has observed his son buying drugs frequently for quite some time. He has a chronic history of drug abuse including crack cocaine and opioid pills. Father reports that the patient gets a disability income of about $4000 a month, which he usually spends within about 2 days of receiving it on drugs. He was observed buying drugs in the neighborhood as recently as yesterday morning.     Past Medical History  Diagnosis Date  . Hypertension   . Stroke [facility](HCC)      Patient Active Problem List   Diagnosis Date Noted  . Hematuria 05/03/2015  . Hemiplegia (HCC) 05/03/2015  . Intestinal obstruction (HCC) 05/03/2015  . 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 Anxiety and depression 05/03/2015  . Hypertension 05/03/2015  . Hyperkalemia 05/03/2015  . 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 CVA (cerebral infarction) 05/03/2015     Past  Surgical History  Procedure Laterality Date  . Appendectomy    . Bowel obstruction       Current Outpatient Rx  Name  Route  Sig  Dispense  Refill  . aspirin 325 MG tablet   Oral   Take 325 mg by mouth daily.         . citalopram (CELEXA) 40 MG tablet   Oral   Take 40 mg by mouth at bedtime.         Marland Kitchen. lisinopril (PRINIVIL,ZESTRIL) 20 MG tablet   Oral   Take 20 mg by mouth daily.         Marland Kitchen. LORazepam (ATIVAN) 0.5 MG tablet   Oral   Take 0.5 mg by mouth every 8 (eight) hours as needed for anxiety.         . metoprolol (LOPRESSOR) 50 MG tablet   Oral   Take 1 tablet (50 mg total) by mouth 2 (two) times daily.   90 tablet   1      Allergies Codeine   Family History  Problem Relation Age of Onset  . Heart disease Father   . Seizures Brother     Social History Social History  Substance Use Topics  . Smoking status: Current Every Day Smoker -- 1.00 packs/day    Types: Cigarettes  . Smokeless tobacco: Never Used  . Alcohol Use: No    Review of Systems  Constitutional:   No fever or chills.  Eyes:   No vision changes.  ENT:   No sore throat. No rhinorrhea. Cardiovascular:   No  chest pain. Respiratory:   No dyspnea or cough. Gastrointestinal:   Positive abdominal pain with vomiting, no diarrhea or constipation.  Genitourinary:   Negative for dysuria or difficulty urinating. Musculoskeletal:   Negative for focal pain or swelling Neurological:   Negative for headaches 10-point ROS otherwise negative.  ____________________________________________   PHYSICAL EXAM:  VITAL SIGNS: ED Triage Vitals  Enc Vitals Group     BP 12/29/15 0830 184/135 mmHg     Pulse Rate 12/29/15 0830 90     Resp 12/29/15 0830 18     Temp 12/29/15 0830 97.7 F (36.5 C)     Temp Source 12/29/15 0830 Oral     SpO2 12/29/15 0830 96 %     Weight 12/29/15 0830 165 lb (74.844 kg)     Height 12/29/15 0830  (1.702 m)     Head Cir --      Peak Flow --      Pain Score  12/29/15 0831 10     Pain Loc --      Pain Edu? --      Excl. in GC? --     Vital signs reviewed, nursing assessments reviewed.   Constitutional:   Alert and oriented. Uncomfortable appearing. Eyes:   No scleral icterus. No conjunctival pallor. PERRL. EOMI.  No nystagmus. ENT   Head:   Normocephalic and atraumatic.   Nose:   No congestion/rhinnorhea. No septal hematoma   Mouth/Throat:   Dry mucous membranes, no pharyngeal erythema. No peritonsillar mass.    Neck:   No stridor. No SubQ emphysema. No meningismus. Hematological/Lymphatic/Immunilogical:   No cervical lymphadenopathy. Cardiovascular:   RRR. Symmetric bilateral radial and DP pulses.  No murmurs.  Respiratory:   Normal respiratory effort without tachypnea nor retractions. Breath sounds are clear and equal bilaterally. No wheezes/rales/rhonchi. Gastrointestinal:   Soft and generalized mild tenderness. Abdominal wall is disfigured and extensively scarred from prior surgeries.. Non distended. There is no CVA tenderness.  No rebound, rigidity, or guarding. Hyperactive bowel sounds Genitourinary:   deferred Musculoskeletal:   Nontender with normal range of motion in all extremities. No joint effusions.  No lower extremity tenderness.  No edema. Neurologic:   Normal speech and language.  CN 2-10 normal. Motor grossly intact. No gross focal neurologic deficits are appreciated.  Skin:    Skin is warm, dry and intact. No rash noted.  No petechiae, purpura, or bullae. Significant for piloerection  ____________________________________________    LABS (pertinent positives/negatives) (all labs ordered are listed, but only abnormal results are displayed) Labs Reviewed  URINALYSIS COMPLETEWITH MICROSCOPIC ([facility] ONLY) - Abnormal; Notable for the following:    Color, Urine YELLOW (*)    APPearance CLEAR (*)    Ketones, ur TRACE (*)    All other components within normal limits  URINE DRUG SCREEN, QUALITATIVE ([facility] ONLY) -  Abnormal; Notable for the following:    Tricyclic, Ur Screen POSITIVE (*)    Opiate, Ur Screen POSITIVE (*)    All other components within normal limits  CBC WITH DIFFERENTIAL/PLATELET - Abnormal; Notable for the following:    Platelets 464 (*)    Neutro Abs 7.2 (*)    All other components within normal limits  ACETAMINOPHEN LEVEL - Abnormal; Notable for the following:    Acetaminophen (Tylenol), Serum 37 (*)    All other components within normal limits  COMPREHENSIVE METABOLIC PANEL - Abnormal; Notable for the following:    CO2 21 (*)    Calcium 8.7 (*)  Total Protein 6.3 (*)    AST 269 (*)    ALT 288 (*)    All other components within normal limits  LIPASE, BLOOD - Abnormal; Notable for the following:    Lipase 127 (*)    All other components within normal limits  ETHANOL  SALICYLATE LEVEL   ____________________________________________   EKG    ____________________________________________    RADIOLOGY  CT abdomen and pelvis unremarkable  ____________________________________________   PROCEDURES   ____________________________________________   INITIAL IMPRESSION / ASSESSMENT AND PLAN / ED COURSE  Pertinent labs & imaging results that were available during my care of the patient were reviewed by me and considered in my medical decision making (see chart for details).  Patient presents with generalized abdominal pain, highly likely to be due to opioid withdrawal. His syndrome is consistent including hyperactive bowel sounds and piloerection on the skin. However, with his extensive abdominal surgeries, we will need to do a CT abdomen and pelvis to evaluate for obstruction or other anatomic complication.   ----------------------------------------- 3:31 PM on 12/29/2015 -----------------------------------------  After obtaining labs, is concerning for elevated LFTs and an elevated serum Tylenol level. Although the Tylenol level is not above the threshold for  acute toxicity, I'm concerned that he likely has chronic toxicity due to continuous use of Tylenol-containing opioids. The patient denies that he takes any Vicodin Norco Percocet. He does report that he takes about 3000 mg of Tylenol per day in divided doses. He is evasive and unable to provide specifics on this. Because the patient completely denied significant Tylenol ingestion, we proceeded with a CT scan which was unremarkable. He does have an elevated lipase as well.  I'll start the patient on acetylcysteine for Tylenol toxicity 4 liver protection, and case discussed with hospitalist for further management.    ____________________________________________   FINAL CLINICAL IMPRESSION(S) / ED DIAGNOSES  Final diagnoses:  Acute hepatitis  Generalized abdominal pain       Portions of this note were generated with dragon dictation software. Dictation errors may occur despite best attempts at proofreading.   Sharman Cheek, MD 12/29/15 [phone]

## 2015-12-29 NOTE — ED Notes (Signed)
Patient transported to CT 

## 2015-12-29 NOTE — ED Notes (Signed)
Pts father called and reported he found a bottle or lorazepam and percocet that he reports his son had taken all of the pills in both.

## 2015-12-29 NOTE — Consult Note (Signed)
MEDICATION RELATED CONSULT NOTE - INITIAL   Pharmacy Consult for N-acetylcysteine Indication: tylenol overdose/liver protection  Allergies  Allergen Reactions  . Codeine Hives    Patient Measurements: Height: 5\' 7"  (170.2 cm) Weight: 165 lb (74.844 kg) IBW/kg (Calculated) : 66.1 Adjusted Body Weight:   Vital Signs: Temp: 97.7 F (36.5 C) (05/17 0830) Temp Source: Oral (05/17 0830) BP: 186/119 mmHg (05/17 1500) Pulse Rate: 79 (05/17 1500) Intake/Output from previous day:   Intake/Output from this shift:    Labs:  Recent Labs  12/29/15 0848 12/29/15 1017  WBC 9.4  --   HGB 14.6  --   HCT 43.1  --   PLT 464*  --   CREATININE  --  0.87  ALBUMIN  --  3.8  PROT  --  6.3*  AST  --  269*  ALT  --  288*  ALKPHOS  --  104  BILITOT  --  1.1   Estimated Creatinine Clearance: 89.7 mL/min (by C-G formula based on Cr of 0.87).   Microbiology: No results found for this or any previous visit (from the past 720 hour(s)).  Medical History: Past Medical History  Diagnosis Date  . Hypertension   . Stroke [facility](HCC)     Medications:  Scheduled:  . acetylcysteine  150 mg/kg Intravenous Once  . ondansetron (ZOFRAN) IV  4 mg Intravenous Once    Assessment: Pt is a 55 year old male with complicated PMH, who reportedly takes 3g of tylenol per day. Per pt father, pt has a hx of drug abuse including opioids. Per pt father, pt spends his disability check on drugs in 2 days. Pt presents with vomiting and abdominal pain. AST/ALT , lipase, and APAP level elevated. Pharmacy consulted to dose n-acetylcysteine.  Case [facility] to [facility] @ 1600. All baseline labs resulted. INR ordered  Goal of Therapy:    Plan:  n-acetylcysteine 150mg /kg once then 15mg /kg/hr continuous. Will recheck APAP level in the AM. Will check AST/ALT and INR 22 hours after infusion.  Will run infusion for atleast 24 hours. If AST/ALT lipase improving, can discontinue, if elevated will need to  continue. Follow up in the AM  Aliece Honold D Xavien Dauphinais, Pharm.D Clinical Pharmacist 12/29/2015,4:04 PM

## 2015-12-29 NOTE — ED Notes (Signed)
IV Right AC established with blood pull back and flush with ease, upon assessment when re-entering the room area red and hard, IV removed and ice applied

## 2015-12-29 NOTE — ED Notes (Signed)
Pt here with his father, c/o abd pain with N/V for the past 3 days.., father states the pt lives with him and gets some kinda of drugs from someone and has been like this for the past 3 days..Marland Kitchen

## 2015-12-30 DIAGNOSIS — T40601A Poisoning by unspecified narcotics, accidental (unintentional), initial encounter: Secondary | ICD-10-CM

## 2015-12-30 DIAGNOSIS — F1111 Opioid abuse, in remission: Secondary | ICD-10-CM

## 2015-12-30 DIAGNOSIS — F1110 Opioid abuse, uncomplicated: Secondary | ICD-10-CM

## 2015-12-30 LAB — COMPREHENSIVE METABOLIC PANEL
ALT: 217 U/L — ABNORMAL HIGH (ref 17–63)
AST: 97 U/L — ABNORMAL HIGH (ref 15–41)
Albumin: 3.5 g/dL (ref 3.5–5.0)
Alkaline Phosphatase: 90 U/L (ref 38–126)
Anion gap: 12 (ref 5–15)
BUN: 12 mg/dL (ref 6–20)
CO2: 18 mmol/L — ABNORMAL LOW (ref 22–32)
Calcium: 8.7 mg/dL — ABNORMAL LOW (ref 8.9–10.3)
Chloride: 109 mmol/L (ref 101–111)
Creatinine, Ser: 0.78 mg/dL (ref 0.61–1.24)
GFR calc Af Amer: >60 mL/min (ref >60)
GFR calc non Af Amer: >60 mL/min (ref >60)
Glucose, Bld: 109 mg/dL — ABNORMAL HIGH (ref 65–99)
Potassium: 3 mmol/L — ABNORMAL LOW (ref 3.5–5.1)
Sodium: 139 mmol/L (ref 135–145)
Total Bilirubin: 1 mg/dL (ref 0.3–1.2)
Total Protein: 6.5 g/dL (ref 6.5–8.1)

## 2015-12-30 LAB — ALT: ALT: 163 U/L — ABNORMAL HIGH (ref 17–63)

## 2015-12-30 LAB — PROTIME-INR
INR: 1.12
Prothrombin Time: 14.6 seconds (ref 11.4–15.0)

## 2015-12-30 LAB — AST: AST: 54 U/L — AB (ref 15–41)

## 2015-12-30 LAB — ACETAMINOPHEN LEVEL: Acetaminophen (Tylenol), Serum: <10 ug/mL — ABNORMAL LOW (ref 10–30)

## 2015-12-30 MED ORDER — CLONIDINE HCL 0.1 MG PO TABS
0.2000 mg | ORAL_TABLET | Freq: Every day | ORAL | Status: DC
  Administered 2015-12-30 – 2015-12-31 (×2): 0.2 mg via ORAL
  Filled 2015-12-30 (×2): qty 2

## 2015-12-30 NOTE — Consult Note (Signed)
MEDICATION RELATED CONSULT NOTE - FOLLOW UP  Pharmacy Consult for N-acetylcysteine Indication: tylenol overdose/liver protection  Allergies  Allergen Reactions  . Codeine Hives    Patient Measurements: Height: 5\' 7"  (170.2 cm) Weight: 165 lb (74.844 kg) IBW/kg (Calculated) : 66.1 Adjusted Body Weight:   Vital Signs: Temp: 99 F (37.2 C) (05/18 0300) Temp Source: Axillary (05/18 0300) BP: 141/97 mmHg (05/18 1000) Pulse Rate: 116 (05/18 1000) Intake/Output from previous day: 05/17 0701 - 05/18 0700 In: 2938.4 [I.V.:2938.4] Out: 2200 [Urine:2200] Intake/Output from this shift: Total I/O In: 28.7 [I.V.:28.7] Out: 800 [Urine:800]  Labs:  Recent Labs  12/29/15 0848 12/29/15 1017 12/30/15 0342  WBC 9.4  --   --   HGB 14.6  --   --   HCT 43.1  --   --   PLT 464*  --   --   CREATININE  --  0.87 0.78  ALBUMIN  --  3.8 3.5  PROT  --  6.3* 6.5  AST  --  269* 97*  ALT  --  288* 217*  ALKPHOS  --  104 90  BILITOT  --  1.1 1.0   Estimated Creatinine Clearance: 97.5 mL/min (by C-G formula based on Cr of 0.78).   Microbiology: Recent Results (from the past 720 hour(s))  MRSA PCR Screening     Status: None   Collection Time: 12/29/15  8:00 PM  Result Value Ref Range Status   MRSA by PCR NEGATIVE NEGATIVE Final    Comment:        The GeneXpert MRSA Assay (FDA approved for NASAL specimens only), is one component of a comprehensive MRSA colonization surveillance program. It is not intended to diagnose MRSA infection nor to guide or monitor treatment for MRSA infections.     Medical History: Past Medical History  Diagnosis Date  . Hypertension   . Stroke [facility](HCC)     Medications:  Scheduled:  . aspirin  325 mg Oral Daily  . citalopram  40 mg Oral QHS  . cloNIDine  0.2 mg Oral Daily  . enoxaparin (LOVENOX) injection  40 mg Subcutaneous Q24H  . lisinopril  20 mg Oral Daily  . metoprolol  50 mg Oral BID    Assessment: Pt is a 55 year old male with  complicated PMH, who reportedly takes 3g of tylenol per day. Per pt father, pt has a hx of drug abuse including opioids. Per pt father, pt spends his disability check on drugs in 2 days. Pt presents with vomiting and abdominal pain. AST/ALT , lipase, and APAP level elevated. Pharmacy consulted to dose n-acetylcysteine.  Case [facility] to [facility] @ 1600. All baseline labs resulted. INR ordered]  5/18 @ ~04:00 APAP level <10; AST: 97 and ALT: 217; INR: 1.12   Goal of Therapy:    Plan:  Spoke with Nonie Hoyerenise Mills @ [facility]. Mrs. Arvilla MarketMills stated that we should continue Mucomyst for now @ current rate and recheck AST and ALT @ 15:00. If LFTs are not trending up then she would recommend discontinuing Mucomyst.   Demetrius Charityeldrin D. Meral Geissinger, PharmD  Clinical Pharmacist 12/30/2015,11:26 AM

## 2015-12-30 NOTE — Consult Note (Signed)
MEDICATION RELATED CONSULT NOTE - FOLLOW UP  Pharmacy Consult for N-acetylcysteine Indication: tylenol overdose/liver protection  Allergies  Allergen Reactions  . Codeine Hives    Patient Measurements: Height: 5\' 7"  (170.2 cm) Weight: 165 lb (74.844 kg) IBW/kg (Calculated) : 66.1 Adjusted Body Weight:   Vital Signs: Temp: 98.7 F (37.1 C) (05/18 1449) Temp Source: Oral (05/18 1449) BP: 125/93 mmHg (05/18 1449) Pulse Rate: 91 (05/18 1449) Intake/Output from previous day: 05/17 0701 - 05/18 0700 In: 2938.4 [I.V.:2938.4] Out: 2200 [Urine:2200] Intake/Output from this shift: Total I/O In: 28.7 [I.V.:28.7] Out: 1000 [Urine:1000]  Labs:  Recent Labs  12/29/15 0848 12/29/15 1017 12/30/15 0342 12/30/15 1500  WBC 9.4  --   --   --   HGB 14.6  --   --   --   HCT 43.1  --   --   --   PLT 464*  --   --   --   CREATININE  --  0.87 0.78  --   ALBUMIN  --  3.8 3.5  --   PROT  --  6.3* 6.5  --   AST  --  269* 97* 54*  ALT  --  288* 217* 163*  ALKPHOS  --  104 90  --   BILITOT  --  1.1 1.0  --    Estimated Creatinine Clearance: 97.5 mL/min (by C-G formula based on Cr of 0.78).   Microbiology: Recent Results (from the past 720 hour(s))  MRSA PCR Screening     Status: None   Collection Time: 12/29/15  8:00 PM  Result Value Ref Range Status   MRSA by PCR NEGATIVE NEGATIVE Final    Comment:        The GeneXpert MRSA Assay (FDA approved for NASAL specimens only), is one component of a comprehensive MRSA colonization surveillance program. It is not intended to diagnose MRSA infection nor to guide or monitor treatment for MRSA infections.     Medical History: Past Medical History  Diagnosis Date  . Hypertension   . Stroke [facility](HCC)     Medications:  Scheduled:  . aspirin  325 mg Oral Daily  . citalopram  40 mg Oral QHS  . cloNIDine  0.2 mg Oral Daily  . enoxaparin (LOVENOX) injection  40 mg Subcutaneous Q24H  . lisinopril  20 mg Oral Daily  . metoprolol  50  mg Oral BID    Assessment: Pt is a 55 year old male with complicated PMH, who reportedly takes 3g of tylenol per day. Per pt father, pt has a hx of drug abuse including opioids. Per pt father, pt spends his disability check on drugs in 2 days. Pt presents with vomiting and abdominal pain. AST/ALT , lipase, and APAP level elevated. Pharmacy consulted to dose n-acetylcysteine.  [facility] @ 1600. All baseline labs resulted. INR ordered]  5/18 @ ~04:00 APAP level <10; AST: 97 and ALT: 217; INR: 1.12   Goal of Therapy:    Plan:  Spoke with Nonie Hoyerenise Mills @ [facility]. Mrs. Arvilla MarketMills stated that we should continue Mucomyst for now @ current rate and recheck AST and ALT @ 15:00. If LFTs are not trending up then she would recommend discontinuing Mucomyst.   5/18 1500 AST ALT trending down. Per poison control and Dr. Sherryll BurgerShah will discontinue Mucomyst. Communicated with RN that it will be d/c and pt may have a regular diet per Dr. Sherryll BurgerShah.  Olene FlossMelissa D Vienne Corcoran, Pharm.D Clinical Pharmacist  12/30/2015,4:13  PM

## 2015-12-30 NOTE — Progress Notes (Signed)
Patient did not eat any of his dinner and states he does not want it. He is still complaining of abdominal pain. Patient has complained for the past hour that he cannot pee. He keeps getting up to try and urinate and can't and keeps saying very loudly "I can't pee, it burns". Bladder scanned patient and resulted at 175. Patient has had good urine output in CCU today. Will continue to monitor at this point.

## 2015-12-30 NOTE — Progress Notes (Signed)
1420 Report called to 2A.

## 2015-12-30 NOTE — Consult Note (Signed)
[facility] Face-to-Face Psychiatry Consult   Reason for Consult:  Consult for 55 year old man brought into the hospital after an overdose of pills. Concern about suicidality or substance abuse Referring Physician:  Manuella Ghazi Patient Identification: Mark [name] MRN:  [id number] Principal Diagnosis: Opiate overdose Diagnosis:   Patient Active Problem List   Diagnosis Date Noted  . Opiate abuse, continuous [F11.10] 12/30/2015  . Opiate overdose [T40.601A] 12/30/2015  . Tylenol toxicity [T39.1X1A] 12/29/2015  . Hematuria [R31.9] 05/03/2015  . Hemiplegia ([place]) [G81.90] 05/03/2015  . Intestinal obstruction ([place]) [K56.60] 05/03/2015  . 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 Anxiety and depression [F41.8] 05/03/2015  . Hypertension [I10] 05/03/2015  . Hyperkalemia [E87.5] 05/03/2015  . 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 CVA (cerebral infarction) [I63.9] 05/03/2015    Total Time spent with patient: 1 hour  Subjective:   Mark [name] is a 55 y.o. male patient admitted with "I don't know. I'm okay".  HPI:  Patient interviewed. Also spoke with his father by phone. Old chart reviewed including previous evaluations by psychiatry. 55 year old man brought in after an apparent overdose. Evaluation showed elevated acetaminophen levels as well as drug screen positive for opiates. Admitted to the hospital and treated for acetaminophen toxicity. On interview today the patient denies that he intentionally tried overdose. Some of the history is difficult to get from him. He tells me at first that he does not know what brought him in and then later denies any drug use. He denies that he's been feeling depressed. He denies any trouble with sleep. Patient totally denies suicidal or homicidal ideation. When I pointed out to him that he was being treated for Tylenol toxicity he told me that he thought he must of overdosed on Tylenol. He claims to not remember anything about the  pills or what brought him into the hospital. Patient does admit to nausea and gastric upset generalized pain and feeling sick and agitated right now. Speaking to his father his father thinks it's clear that the patient is abusing pills that he buys on the street. This is been an ongoing problem for years. Doesn't appear to be drinking recently. Doesn't appear to be using anything else except probably narcotics. Daily activity of the patient is minimally has minimal stimulation and her activity to his life.  Social history: Patient lives with his parents. He is unable to work since having had some strokes. Very minimal activity other than hanging out with people who sell drugs.  Medical history: Patient has had more than one major stroke in the past probably related to abuse of cocaine in the past. He has a chronic case aphasia and difficulty with short-term memory even at baseline.  Substance abuse history: Long-standing abuse of multiple drugs but with opiates recently as the main issue. Also has had periods of abuse of cocaine in the past. Tends to minimize or deny his drug abuse and avoid engaging in appropriate treatment.  Past Psychiatric History: Patient has had psychiatric treatment and evaluation in the past mostly around his substance abuse. I saw him in the hospital around the time that he had his strokes and he was extremely up impaired and delirious at that time. No evidence that he is actually tried to kill himself in the past. Not currently on any psychiatric medicine for depression although his primary care doctor continues to prescribe lorazepam 0.5 mg twice a day for general anxiety. No history of violence no history of psychosis  Risk to Self: Is  patient at risk for suicide?: No Risk to Others:   Prior Inpatient Therapy:   Prior Outpatient Therapy:    Past Medical History:  Past Medical History  Diagnosis Date  . Hypertension   . Stroke [facility])     Past Surgical History   Procedure Laterality Date  . Appendectomy    . Bowel obstruction     Family History:  Family History  Problem Relation Age of Onset  . Heart disease Father   . Seizures Brother    Family Psychiatric  History: No known family history of mental health problems or substance abuse Social History:  History  Alcohol Use No     History  Drug Use No    Social History   Social History  . Marital Status: Divorced    Spouse Name: N/A  . Number of Children: N/A  . Years of Education: N/A   Social History Main Topics  . Smoking status: Current Every Day Smoker -- 1.00 packs/day    Types: Cigarettes  . Smokeless tobacco: Never Used  . Alcohol Use: No  . Drug Use: No  . Sexual Activity: Not Asked   Other Topics Concern  . None   Social History Narrative   Additional Social History:    Allergies:   Allergies  Allergen Reactions  . Codeine Hives    Labs:  Results for orders placed or performed during the hospital encounter of 12/29/15 (from the past 48 hour(s))  CBC with Differential     Status: Abnormal   Collection Time: 12/29/15  8:48 AM  Result Value Ref Range   WBC 9.4 3.8 - 10.6 K/uL   RBC 4.58 4.40 - 5.90 MIL/uL   Hemoglobin 14.6 13.0 - 18.0 g/dL   HCT 43.1 40.0 - 52.0 %   MCV 94.1 80.0 - 100.0 fL   MCH 31.9 26.0 - 34.0 pg   MCHC 33.9 32.0 - 36.0 g/dL   RDW 14.0 11.5 - 14.5 %   Platelets 464 (H) 150 - 440 K/uL    Comment: PLATELET CLUMPS NOTED ON SMEAR, COUNT APPEARS ADEQUATE   Neutrophils Relative % 76% %   Neutro Abs 7.2 (H) 1.4 - 6.5 K/uL   Lymphocytes Relative 18% %   Lymphs Abs 1.7 1.0 - 3.6 K/uL   Monocytes Relative 4% %   Monocytes Absolute 0.4 0.2 - 1.0 K/uL   Eosinophils Relative 1% %   Eosinophils Absolute 0.1 0 - 0.7 K/uL   Basophils Relative 1% %   Basophils Absolute 0.1 0 - 0.1 K/uL  Acetaminophen level     Status: Abnormal   Collection Time: 12/29/15 10:17 AM  Result Value Ref Range   Acetaminophen (Tylenol), Serum 37 (H) 10 - 30 ug/mL     Comment:        THERAPEUTIC CONCENTRATIONS VARY SIGNIFICANTLY. A RANGE OF 10-30 ug/mL MAY BE AN EFFECTIVE CONCENTRATION FOR MANY PATIENTS. HOWEVER, SOME ARE BEST TREATED AT CONCENTRATIONS OUTSIDE THIS RANGE. ACETAMINOPHEN CONCENTRATIONS >150 ug/mL AT 4 HOURS AFTER INGESTION AND >50 ug/mL AT 12 HOURS AFTER INGESTION ARE OFTEN ASSOCIATED WITH TOXIC REACTIONS.   Ethanol     Status: None   Collection Time: 12/29/15 10:17 AM  Result Value Ref Range   Alcohol, Ethyl (B) <5 <5 mg/dL    Comment:        LOWEST DETECTABLE LIMIT FOR SERUM ALCOHOL IS 5 mg/dL FOR MEDICAL PURPOSES ONLY   Salicylate level     Status: None   Collection Time: 12/29/15 10:17 AM  Result Value Ref Range   Salicylate Lvl <9.4 2.8 - 30.0 mg/dL  Comprehensive metabolic panel     Status: Abnormal   Collection Time: 12/29/15 10:17 AM  Result Value Ref Range   Sodium 138 135 - 145 mmol/L   Potassium 4.9 3.5 - 5.1 mmol/L    Comment: HEMOLYSIS AT THIS LEVEL MAY AFFECT RESULT   Chloride 109 101 - 111 mmol/L   CO2 21 (L) 22 - 32 mmol/L   Glucose, Bld 98 65 - 99 mg/dL   BUN 16 6 - 20 mg/dL   Creatinine, Ser 0.87 0.61 - 1.24 mg/dL   Calcium 8.7 (L) 8.9 - 10.3 mg/dL   Total Protein 6.3 (L) 6.5 - 8.1 g/dL   Albumin 3.8 3.5 - 5.0 g/dL   AST 269 (H) 15 - 41 U/L    Comment: HEMOLYSIS AT THIS LEVEL MAY AFFECT RESULT   ALT 288 (H) 17 - 63 U/L    Comment: HEMOLYSIS AT THIS LEVEL MAY AFFECT RESULT   Alkaline Phosphatase 104 38 - 126 U/L   Total Bilirubin 1.1 0.3 - 1.2 mg/dL    Comment: HEMOLYSIS AT THIS LEVEL MAY AFFECT RESULT   GFR calc non Af Amer >60 >60 mL/min   GFR calc Af Amer >60 >60 mL/min    Comment: (NOTE) The eGFR has been calculated using the CKD EPI equation. This calculation has not been validated in all clinical situations. eGFR's persistently <60 mL/min signify possible Chronic Kidney Disease.    Anion gap 8 5 - 15  Lipase, blood     Status: Abnormal   Collection Time: 12/29/15 10:17 AM  Result  Value Ref Range   Lipase 127 (H) 11 - 51 U/L  Urinalysis complete, with microscopic     Status: Abnormal   Collection Time: 12/29/15 11:26 AM  Result Value Ref Range   Color, Urine YELLOW (A) YELLOW   APPearance CLEAR (A) CLEAR   Glucose, UA NEGATIVE NEGATIVE mg/dL   Bilirubin Urine NEGATIVE NEGATIVE   Ketones, ur TRACE (A) NEGATIVE mg/dL   Specific Gravity, Urine 1.026 1.005 - 1.030   Hgb urine dipstick NEGATIVE NEGATIVE   pH 5.0 5.0 - 8.0   Protein, ur NEGATIVE NEGATIVE mg/dL   Nitrite NEGATIVE NEGATIVE   Leukocytes, UA NEGATIVE NEGATIVE   RBC / HPF 0-5 0 - 5 RBC/hpf   WBC, UA 0-5 0 - 5 WBC/hpf   Bacteria, UA NONE SEEN NONE SEEN   Squamous Epithelial / LPF NONE SEEN NONE SEEN   Mucous PRESENT    Ca Oxalate Crys, UA PRESENT   Urine Drug Screen, Qualitative     Status: Abnormal   Collection Time: 12/29/15 11:26 AM  Result Value Ref Range   Tricyclic, Ur Screen POSITIVE (A) NONE DETECTED   Amphetamines, Ur Screen NONE DETECTED NONE DETECTED   MDMA (Ecstasy)Ur Screen NONE DETECTED NONE DETECTED   Cocaine Metabolite,Ur [place] NONE DETECTED NONE DETECTED   Opiate, Ur Screen POSITIVE (A) NONE DETECTED   Phencyclidine (PCP) Ur S NONE DETECTED NONE DETECTED   Cannabinoid 50 Ng, Ur [place] NONE DETECTED NONE DETECTED   Barbiturates, Ur Screen NONE DETECTED NONE DETECTED   Benzodiazepine, Ur Scrn NONE DETECTED NONE DETECTED   Methadone Scn, Ur NONE DETECTED NONE DETECTED    Comment: (NOTE) 854  Tricyclics, urine               Cutoff 1000 ng/mL 200  Amphetamines, urine  Cutoff 1000 ng/mL 300  MDMA (Ecstasy), urine           Cutoff 500 ng/mL 400  Cocaine Metabolite, urine       Cutoff 300 ng/mL 500  Opiate, urine                   Cutoff 300 ng/mL 600  Phencyclidine (PCP), urine      Cutoff 25 ng/mL 700  Cannabinoid, urine              Cutoff 50 ng/mL 800  Barbiturates, urine             Cutoff 200 ng/mL 900  Benzodiazepine, urine           Cutoff 200 ng/mL 1000 Methadone, urine                 Cutoff 300 ng/mL 1100 1200 The urine drug screen provides only a preliminary, unconfirmed 1300 analytical test result and should not be used for non-medical 1400 purposes. Clinical consideration and professional judgment should 1500 be applied to any positive drug screen result due to possible 1600 interfering substances. A more specific alternate chemical method 1700 must be used in order to obtain a confirmed analytical result.  1800 Gas chromato graphy / mass spectrometry (GC/MS) is the preferred 1900 confirmatory method.   Protime-INR     Status: None   Collection Time: 12/29/15  4:33 PM  Result Value Ref Range   Prothrombin Time 13.2 11.4 - 15.0 seconds   INR 0.98   MRSA PCR Screening     Status: None   Collection Time: 12/29/15  8:00 PM  Result Value Ref Range   MRSA by PCR NEGATIVE NEGATIVE    Comment:        The GeneXpert MRSA Assay (FDA approved for NASAL specimens only), is one component of a comprehensive MRSA colonization surveillance program. It is not intended to diagnose MRSA infection nor to guide or monitor treatment for MRSA infections.   Acetaminophen level     Status: Abnormal   Collection Time: 12/30/15  3:42 AM  Result Value Ref Range   Acetaminophen (Tylenol), Serum <10 (L) 10 - 30 ug/mL    Comment:        THERAPEUTIC CONCENTRATIONS VARY SIGNIFICANTLY. A RANGE OF 10-30 ug/mL MAY BE AN EFFECTIVE CONCENTRATION FOR MANY PATIENTS. HOWEVER, SOME ARE BEST TREATED AT CONCENTRATIONS OUTSIDE THIS RANGE. ACETAMINOPHEN CONCENTRATIONS >150 ug/mL AT 4 HOURS AFTER INGESTION AND >50 ug/mL AT 12 HOURS AFTER INGESTION ARE OFTEN ASSOCIATED WITH TOXIC REACTIONS.   Comprehensive metabolic panel     Status: Abnormal   Collection Time: 12/30/15  3:42 AM  Result Value Ref Range   Sodium 139 135 - 145 mmol/L   Potassium 3.0 (L) 3.5 - 5.1 mmol/L   Chloride 109 101 - 111 mmol/L   CO2 18 (L) 22 - 32 mmol/L   Glucose, Bld 109 (H) 65 - 99 mg/dL   BUN  12 6 - 20 mg/dL   Creatinine, Ser 0.78 0.61 - 1.24 mg/dL   Calcium 8.7 (L) 8.9 - 10.3 mg/dL   Total Protein 6.5 6.5 - 8.1 g/dL   Albumin 3.5 3.5 - 5.0 g/dL   AST 97 (H) 15 - 41 U/L   ALT 217 (H) 17 - 63 U/L   Alkaline Phosphatase 90 38 - 126 U/L   Total Bilirubin 1.0 0.3 - 1.2 mg/dL   GFR calc non Af Amer >60 >60 mL/min   GFR calc  Af Amer >60 >60 mL/min    Comment: (NOTE) The eGFR has been calculated using the CKD EPI equation. This calculation has not been validated in all clinical situations. eGFR's persistently <60 mL/min signify possible Chronic Kidney Disease.    Anion gap 12 5 - 15  Protime-INR     Status: None   Collection Time: 12/30/15  5:40 AM  Result Value Ref Range   Prothrombin Time 14.6 11.4 - 15.0 seconds   INR 1.12   AST     Status: Abnormal   Collection Time: 12/30/15  3:00 PM  Result Value Ref Range   AST 54 (H) 15 - 41 U/L  ALT     Status: Abnormal   Collection Time: 12/30/15  3:00 PM  Result Value Ref Range   ALT 163 (H) 17 - 63 U/L    Current Facility-Administered Medications  Medication Dose Route Frequency Provider Last Rate Last Dose  . 0.9 %  sodium chloride infusion   Intravenous Continuous Fritzi Mandes, MD 125 mL/hr at 12/30/15 0800    . aspirin tablet 325 mg  325 mg Oral Daily Fritzi Mandes, MD   325 mg at 12/30/15 1022  . citalopram (CELEXA) tablet 40 mg  40 mg Oral QHS Fritzi Mandes, MD   40 mg at 12/29/15 2210  . cloNIDine (CATAPRES) tablet 0.2 mg  0.2 mg Oral Daily Vipul Shah, MD   0.2 mg at 12/30/15 1022  . enoxaparin (LOVENOX) injection 40 mg  40 mg Subcutaneous Q24H Fritzi Mandes, MD   40 mg at 12/30/15 1901  . hydrALAZINE (APRESOLINE) injection 10 mg  10 mg Intravenous Q4H PRN Lytle Butte, MD   10 mg at 12/30/15 0841  . ketorolac (TORADOL) 15 MG/ML injection 15 mg  15 mg Intravenous Q6H PRN Fritzi Mandes, MD   15 mg at 12/30/15 2043  . lisinopril (PRINIVIL,ZESTRIL) tablet 20 mg  20 mg Oral Daily Fritzi Mandes, MD   20 mg at 12/30/15 1023  . LORazepam  (ATIVAN) tablet 0.5 mg  0.5 mg Oral Q8H PRN Fritzi Mandes, MD      . metoprolol (LOPRESSOR) tablet 50 mg  50 mg Oral BID Fritzi Mandes, MD   50 mg at 12/30/15 1023  . ondansetron (ZOFRAN) tablet 4 mg  4 mg Oral Q6H PRN Fritzi Mandes, MD       Or  . ondansetron (ZOFRAN) injection 4 mg  4 mg Intravenous Q6H PRN Fritzi Mandes, MD   4 mg at 12/30/15 1558    Musculoskeletal: Strength & Muscle Tone: decreased Gait & Station: unsteady Patient leans: N/A  Psychiatric Specialty Exam: Review of Systems  Constitutional: Positive for chills.  HENT: Negative.   Eyes: Negative.   Respiratory: Negative.   Cardiovascular: Negative.   Gastrointestinal: Positive for nausea and abdominal pain.  Musculoskeletal: Positive for myalgias.  Skin: Negative.   Neurological: Negative.   Psychiatric/Behavioral: Positive for memory loss and substance abuse. Negative for depression, suicidal ideas and hallucinations. The patient is nervous/anxious and has insomnia.     Blood pressure 108/70, pulse 95, temperature 98.7 F (37.1 C), temperature source Oral, resp. rate 18, height 5' 7"  (1.702 m), weight 72.576 kg (160 lb), SpO2 96 %.Body mass index is 25.05 kg/(m^2).  General Appearance: Disheveled  Eye Contact::  Minimal  Speech:  Garbled and Slurred  Volume:  Decreased  Mood:  Dysphoric  Affect:  Looks physically sick  Thought Process:  Circumstantial  Orientation:  Other:  Patient was not fully oriented. Couldn't tell  me exactly where he was. Seemed to have a general idea though.  Thought Content:  Negative  Suicidal Thoughts:  No  Homicidal Thoughts:  No  Memory:  Immediate;   Fair Recent;   Fair Remote;   Fair  Judgement:  Impaired  Insight:  Shallow  Psychomotor Activity:  Decreased  Concentration:  Fair  Recall:  [employer] of Knowledge:Fair  Language: Poor  Akathisia:  No  Handed:  Right  AIMS (if indicated):     Assets:  Financial Resources/Insurance Housing Resilience Social Support  ADL's:   Impaired  Cognition: Impaired,  Mild  Sleep:      Treatment Plan Summary: Plan 55 year old man came into the hospital having overdosed probably on pain medicines that are combination pills with Tylenol. He refuses to admitted or discuss it. On examination today he seems to almost certainly be having acute narcotic withdrawal. He is sick to his stomach belching feeling like throwing up he's sweaty in pain and rolling around. No evidence of suicidality or acute depression. Tried to connect with him around substance abuse but he minimizes or evades it. No indication for specific psychiatric medicine. Comfort measures for detox. I will follow-up as needed.  Disposition: Patient does not meet criteria for psychiatric inpatient admission. Supportive therapy provided about ongoing stressors.  Alethia Berthold, MD 12/30/2015 8:53 PM

## 2015-12-30 NOTE — Progress Notes (Signed)
Chaplain rounded the unit and provided a compassionate presence and support for to the patient through silent prayer. Patient appeared to be sleeping. Chaplain Elvy Mclarty [phone] 

## 2015-12-30 NOTE — Progress Notes (Signed)
[facility] - [place] at [facility]   PATIENT NAME: Mark [name]    MR#:  [id number]  DATE OF BIRTH:  [birth year]  SUBJECTIVE:  CHIEF COMPLAINT:   Chief Complaint  Patient presents with  . Abdominal Pain  drowsy, no complaints  REVIEW OF SYSTEMS:  Review of Systems  Constitutional: Negative for fever, weight loss, malaise/fatigue and diaphoresis.  HENT: Negative for ear discharge, ear pain, hearing loss, nosebleeds, sore throat and tinnitus.   Eyes: Negative for blurred vision and pain.  Respiratory: Negative for cough, hemoptysis, shortness of breath and wheezing.   Cardiovascular: Negative for chest pain, palpitations, orthopnea and leg swelling.  Gastrointestinal: Negative for heartburn, nausea, vomiting, abdominal pain, diarrhea, constipation and blood in stool.  Genitourinary: Negative for dysuria, urgency and frequency.  Musculoskeletal: Negative for myalgias and back pain.  Skin: Negative for itching and rash.  Neurological: Negative for dizziness, tingling, tremors, focal weakness, seizures, weakness and headaches.  Psychiatric/Behavioral: Negative for depression. The patient is not nervous/anxious.    DRUG ALLERGIES:   Allergies  Allergen Reactions  . Codeine Hives   VITALS:  Blood pressure 125/90, pulse 87, temperature 97.8 F (36.6 C), temperature source Axillary, resp. rate 17, height  (1.702 m), weight 74.844 kg (165 lb), SpO2 96 %. PHYSICAL EXAMINATION:  Physical Exam  Constitutional: He is well-developed, well-nourished, and in no distress.  HENT:  Head: Normocephalic and atraumatic.  Eyes: Conjunctivae and EOM are normal. Pupils are equal, round, and reactive to light.  Neck: Normal range of motion. Neck supple. No tracheal deviation present. No thyromegaly present.  Cardiovascular: Normal rate, regular rhythm and normal heart sounds.   Pulmonary/Chest: Effort normal and breath sounds normal. No respiratory distress. He has  no wheezes. He exhibits no tenderness.  Abdominal: Soft. Bowel sounds are normal. He exhibits no distension. There is no tenderness.  Musculoskeletal: Normal range of motion.  Neurological: No cranial nerve deficit.  drowsy  Skin: Skin is warm and dry. No rash noted.  Psychiatric: He exhibits a depressed mood. He exhibits disordered thought content. He has a flat affect.   LABORATORY PANEL:   CBC  Recent Labs Lab 12/29/15 0848  WBC 9.4  HGB 14.6  HCT 43.1  PLT 464*   ------------------------------------------------------------------------------------------------------------------ Chemistries   Recent Labs Lab 12/30/15 0342  NA 139  K 3.0*  CL 109  CO2 18*  GLUCOSE 109*  BUN 12  CREATININE 0.78  CALCIUM 8.7*  AST 97*  ALT 217*  ALKPHOS 90  BILITOT 1.0   RADIOLOGY:  No results found. ASSESSMENT AND PLAN:  Mark [name] is a 55 y.o. male with a known history ofAnxiety, hypertension, history of stroke, chronic low back pain, history of street drug use per patient's family comes to the emergency room with nausea vomiting for 3 days. Patient has extensive history of some abdominal surgery and according to his father had about 17 surgeries on his abdomen since he was a small boy. Father is unable to tell me the exact diagnosis. Patient apparently has been using significant amount of Tylenol at home he is not able to quantitate the amount he is taking.   1. Tylenol toxicity acute due to using excessive amount of Tylenol at home patient unable to quantitate - poison control following. And following their recommendation for LFTs check and mucomyst -LFTs, PT/INR and Tylenol level trending down  2. Nausea vomiting -continue IV fluids -When necessary IV Zofran  3. Malignant hypertension continue home meds -IV hydralazine and  needed if systolic greater than 180  4. Chronic pain medication use -Patient is not on any prescription narcotics although per family he has been  dealing with people using street drugs -Patient denies it. His urine drug screen is positive for opiates and tricyclic antidepressants -Patient uses lorazepam which is prescribed through his primary care - hold off on getting any IV or oral narcotics at present. can use IV Toradol if needed -Psychiatry consultation given overuse off pain meds and substance abuse  5. Multiple abdominal surgeries in the past patella do not known  6. Hypokalemia: Replete and recheck  DVT prophylaxis subcutaneous Lovenox   Can go to off unit tele  All the records are reviewed and case discussed with Care Management/Social Worker. Management plans discussed with the patient, family and they are in agreement.  CODE STATUS: FULL CODE  TOTAL TIME TAKING CARE OF THIS PATIENT: 35 minutes.   More than 50% of the time was spent in counseling/coordination of care: YES  POSSIBLE D/C IN 1-2 DAYS, DEPENDING ON CLINICAL CONDITION.   [facility], Ellianna Ruest M.D on 12/30/2015 at 2:35 PM  Between 7am to 6pm - Pager - [phone]  After 6pm go to [email or web address] - password [email or web address]  [facility] [place] [facility]  Office  [phone]  CC: Primary care physician; Gabriel Cirriheryl Wicker, NP  Note: This dictation was prepared with Dragon dictation along with smaller phrase technology. Any transcriptional errors that result from this process are unintentional.

## 2015-12-30 NOTE — Progress Notes (Signed)
Patient's liver enzymes were redrawn at 1500 today. They are trending down. Dr. Sherryll BurgerShah paged and notified and MD stated to contact poison control. MD stated patient could eat regular food. Received call from pharmacy that they have been in contact with poison control and as long as liver enzymes trended down mucamist drip can be stopped. Will discontinue now and put patient on a regular diet. Patient is complaining of 10/10 abdominal pain even after giving toradol. MD stated patient cannot have any other medications due to his overdose at this time. Will try to make patient as comfortable as possible.

## 2015-12-30 NOTE — Progress Notes (Signed)
Poison control called primary RN to get lab results and to check on the patient. Stated they will follow the patient one more day due to his abdominal pain and nausea.

## 2015-12-31 LAB — COMPREHENSIVE METABOLIC PANEL
ALBUMIN: 3 g/dL — AB (ref 3.5–5.0)
ALK PHOS: 77 U/L (ref 38–126)
ALT: 116 U/L — ABNORMAL HIGH (ref 17–63)
ANION GAP: 7 (ref 5–15)
AST: 29 U/L (ref 15–41)
BILIRUBIN TOTAL: 0.7 mg/dL (ref 0.3–1.2)
BUN: 16 mg/dL (ref 6–20)
CALCIUM: 8.1 mg/dL — AB (ref 8.9–10.3)
CO2: 19 mmol/L — ABNORMAL LOW (ref 22–32)
Chloride: 111 mmol/L (ref 101–111)
Creatinine, Ser: 0.86 mg/dL (ref 0.61–1.24)
GFR calc Af Amer: >60 mL/min (ref >60)
GLUCOSE: 99 mg/dL (ref 65–99)
Potassium: 2.7 mmol/L — CL (ref 3.5–5.1)
Sodium: 137 mmol/L (ref 135–145)
TOTAL PROTEIN: 5.3 g/dL — AB (ref 6.5–8.1)

## 2015-12-31 LAB — CBC
HEMATOCRIT: 37 % — AB (ref 40.0–52.0)
HEMOGLOBIN: 12.4 g/dL — AB (ref 13.0–18.0)
MCH: 31.5 pg (ref 26.0–34.0)
MCHC: 33.5 g/dL (ref 32.0–36.0)
MCV: 93.9 fL (ref 80.0–100.0)
Platelets: 354 10*3/uL (ref 150–440)
RBC: 3.94 MIL/uL — ABNORMAL LOW (ref 4.40–5.90)
RDW: 14.4 % (ref 11.5–14.5)
WBC: 7.2 10*3/uL (ref 3.8–10.6)

## 2015-12-31 MED ORDER — POTASSIUM CHLORIDE CRYS ER 20 MEQ PO TBCR
40.0000 meq | EXTENDED_RELEASE_TABLET | ORAL | Status: AC
  Administered 2015-12-31 (×2): 40 meq via ORAL
  Filled 2015-12-31 (×2): qty 2

## 2015-12-31 MED ORDER — POTASSIUM CHLORIDE 10 MEQ/100ML IV SOLN
10.0000 meq | INTRAVENOUS | Status: AC
  Administered 2015-12-31 (×3): 10 meq via INTRAVENOUS
  Filled 2015-12-31 (×4): qty 100

## 2015-12-31 NOTE — Clinical Social Work Note (Signed)
Clinical Social Work Assessment  Patient Details  Name: Mark [name] MRN: [id number] Date of Birth: 30-Mar-1961  Date of referral:  12/31/15               Reason for consult:  Discharge Planning                Permission sought to share information with:    Permission granted to share information::  No  Name::        Agency::     Relationship::     Contact Information:     Housing/Transportation Living arrangements for the past 2 months:  Single Family Home Source of Information:  Patient Patient Interpreter Needed:  None Criminal Activity/Legal Involvement Pertinent to Current Situation/Hospitalization:  No - Comment as needed Significant Relationships:  Adult Children, Other Family Members, Parents Lives with:  Parents Do you feel safe going back to the place where you live?  Yes Need for family participation in patient care:  No (Coment)  Care giving concerns:  Patient overdosed on Tylenol. There are concerns about his drug usage and MH status.    Social Worker assessment / plan:  CSW received a consult from MD for drug abuse. CSW met with patient at bedside. CSW introduced herself and her role. CSW inquired about why patient was admitted into the hospital. Patient reports that he took to many Tylenol. Patient reports that he does not know why he took the Tylenol. CSW inquired about other drug usage. Patient declined using any other drugs. CSW inquired about patient's mental health history. Patient reported that he did not want mental health or substance abuse services. CSW requested to leave an [employer] for [employer], Drug, and other services offered in [place]. Patient allowed CSW to leave information. There are no other CSW needs at this time. CSW is available if a CSW need were to arise. CSW is signing off.   Employment status:  Unemployed Forensic scientist:    PT Recommendations:  Not assessed at this time Information / Referral to  community resources:  Outpatient Substance Abuse Treatment Options, Residential Substance Abuse Treatment Options  Patient/Family's Response to care:  Patient declined assistance from CSW. Stated he did not need Mental health or substance abuse resources.   Patient/Family's Understanding of and Emotional Response to Diagnosis, Current Treatment, and Prognosis:  Patient reports that he understands his diagnosis and declined assistance from [place] is signing off.   Emotional Assessment Appearance:  Appears stated age Attitude/Demeanor/Rapport:  Lethargic Affect (typically observed):  Flat, Agitated Orientation:  Oriented to Self, Oriented to Place, Oriented to  Time, Oriented to Situation Alcohol / Substance use:  Other (Patient overdosed on tylenol) Psych involvement (Current and /or in the community):  No (Comment)  Discharge Needs  Concerns to be addressed:  Discharge Planning Concerns Readmission within the last 30 days:  No Current discharge risk:  Chronically ill Barriers to Discharge:  Continued Medical Work up   [employer], LCSW 12/31/2015, 1:33 PM

## 2015-12-31 NOTE — Care Management Important Message (Signed)
Important Message  Patient Details  Name: Mark [name] MRN: [id number] Date of Birth: 08-Jan-1961   Medicare Important Message Given:  Yes    Olegario MessierKathy A Rylee Huestis 12/31/2015, 12:39 PM

## 2015-12-31 NOTE — Progress Notes (Signed)
Patient received discharge instructions, pt verbalized understanding. Patient [place] resources from [place]. IV was removed with no signs of infection. Dressing clean, dry intact. No skin tears or wounds present. Patient was escorted out with staff member via wheelchair via private auto. No further needs from care management team.

## 2015-12-31 NOTE — Discharge Instructions (Signed)
Narcotic Overdose °A narcotic overdose is the misuse or overuse of a narcotic drug. A narcotic overdose can make you pass out and stop breathing. If you are not treated right away, this can cause permanent brain damage or stop your heart. Medicine may be given to reverse the effects of an overdose. If so, this medicine may bring on withdrawal symptoms. The symptoms may be abdominal cramps, throwing up (vomiting), sweating, chills, and nervousness. °Injecting narcotics can cause more problems than just an overdose. AIDS, hepatitis, and other very serious infections are transmitted by sharing needles and syringes. If you decide to quit using, there are medicines which can help you through the withdrawal period. Trying to quit all at once on your own can be uncomfortable, but not life-threatening. Call your caregiver, Narcotics Anonymous, or any drug and alcohol treatment program for further help.  °  °This information is not intended to replace advice given to you by your health care provider. Make sure you discuss any questions you have with your health care provider. °  °Document Released: 09/07/2004 Document Revised: 08/21/2014 Document Reviewed: 01/20/2015 °Elsevier Interactive Patient Education ©2016 Elsevier Inc. ° °

## 2015-12-31 NOTE — Progress Notes (Signed)
Pt potassium 2.7 this am. MD Dr. Sheryle Hailiamond notified. Orders placed. Syliva Overmanassie A Travin Marik, RN

## 2015-12-31 NOTE — Consult Note (Signed)
   [facility]   12/31/2015  Cottie BandaRodney D Williamson 12/17/1960 [id number]  Patient screened for potential [facility] Care Management services. Patient is eligible for [facility] Services. Electronic medical record reveals there were no identifiable [facility] care [facility] needs at this time. [facility] Care [facility] services not appropriate at this time. If patient's post hospital needs change please place a [facility] Care [facility] consult. For questions please contact:   Edis Huish RN, BSN [facility]  Hospital Liaison  [phone]) Business Mobile [phone]) Toll free office

## 2015-12-31 NOTE — Progress Notes (Signed)
Poison control called RN to follow up with patient's abd pain and nausea along with plan of care. Stated they will no longer follow patient.

## 2016-01-02 NOTE — Discharge Summary (Signed)
[facility] Physicians - [place] at [facility]   PATIENT NAME: Mark [name]    MR#:  [id number]  DATE OF BIRTH:  1960/08/20  DATE OF ADMISSION:  12/29/2015 ADMITTING PHYSICIAN: Enedina Finner, MD  DATE OF DISCHARGE: 12/31/2015  4:36 PM  PRIMARY CARE PHYSICIAN: Gabriel Cirri, NP    ADMISSION DIAGNOSIS:  Acute hepatitis [K72.00] Generalized abdominal pain [R10.84]  DISCHARGE DIAGNOSIS:  Principal Problem:   Opiate overdose Active Problems:   Aphasia with stroke   Tylenol toxicity   Opiate abuse, continuous  SECONDARY DIAGNOSIS:   Past Medical History  Diagnosis Date  . Hypertension   . Stroke [facility])     HOSPITAL COURSE:  Mark [name] is a 55 y.o. male with a known history ofAnxiety, hypertension, history of stroke, chronic low back pain, history of street drug use per patient's family comes to the emergency room with nausea vomiting for 3 days. Patient has extensive history of some abdominal surgery and according to his father had about 17 surgeries on his abdomen since he was a small boy. Father is unable to tell me the exact diagnosis. Patient apparently has been using significant amount of Tylenol at home he is not able to quantitate the amount he is taking.   1. Tylenol toxicity: treated per Poison control who followed through the course.  2. Nausea vomiting -transient and resolved  3. Malignant hypertension continue home meds - due to noncompliance. resolved  4. Chronic pain medication use - concern for abuse (buying off the street)  5. Multiple abdominal surgeries in the past patella do not known  6. Hypokalemia: Repleted   High risk for readmission and Narcotics abuse.  DISCHARGE CONDITIONS:   stable  CONSULTS OBTAINED:  Treatment Team:  Audery Amel, MD  DRUG ALLERGIES:   Allergies  Allergen Reactions  . Codeine Hives    DISCHARGE MEDICATIONS:   Discharge Medication List as of 12/31/2015  3:43 PM    CONTINUE these  medications which have NOT CHANGED   Details  aspirin 325 MG tablet Take 325 mg by mouth daily., Until Discontinued, Historical Med    citalopram (CELEXA) 40 MG tablet Take 40 mg by mouth at bedtime., Until Discontinued, Historical Med    lisinopril (PRINIVIL,ZESTRIL) 20 MG tablet Take 20 mg by mouth daily., Until Discontinued, Historical Med    LORazepam (ATIVAN) 0.5 MG tablet Take 0.5 mg by mouth every 8 (eight) hours as needed for anxiety., Until Discontinued, Historical Med    metoprolol (LOPRESSOR) 50 MG tablet Take 1 tablet (50 mg total) by mouth 2 (two) times daily., Starting 10/05/2015, Until Discontinued, Normal         DISCHARGE INSTRUCTIONS:    DIET:  Regular diet  DISCHARGE CONDITION:  Good  ACTIVITY:  Activity as tolerated  OXYGEN:  Home Oxygen: No.   Oxygen Delivery: room air  DISCHARGE LOCATION:  home   If you experience worsening of your admission symptoms, develop shortness of breath, life threatening emergency, suicidal or homicidal thoughts you must seek medical attention immediately by calling 911 or calling your MD immediately  if symptoms less severe.  You Must read complete instructions/literature along with all the possible adverse reactions/side effects for all the Medicines you take and that have been prescribed to you. Take any new Medicines after you have completely understood and accpet all the possible adverse reactions/side effects.   Please note  You were cared for by a hospitalist during your hospital stay. If you have any questions about your  discharge medications or the care you received while you were in the hospital after you are discharged, you can call the unit and asked to speak with the hospitalist on call if the hospitalist that took care of you is not available. Once you are discharged, your primary care physician will handle any further medical issues. Please note that NO REFILLS for any discharge medications will be authorized once  you are discharged, as it is imperative that you return to your primary care physician (or establish a relationship with a primary care physician if you do not have one) for your aftercare needs so that they can reassess your need for medications and monitor your lab values.    On the day of Discharge:  VITAL SIGNS:  Blood pressure 115/71, pulse 69, temperature 98 F (36.7 C), temperature source Oral, resp. rate 20, height 5\' 7"  (1.702 m), weight 72.576 kg (160 lb), SpO2 98 %. PHYSICAL EXAMINATION:  GENERAL:  55 y.o.-year-old patient lying in the bed with no acute distress.  EYES: Pupils equal, round, reactive to light and accommodation. No scleral icterus. Extraocular muscles intact.  HEENT: Head atraumatic, normocephalic. Oropharynx and nasopharynx clear.  NECK:  Supple, no jugular venous distention. No thyroid enlargement, no tenderness.  LUNGS: Normal breath sounds bilaterally, no wheezing, rales,rhonchi or crepitation. No use of accessory muscles of respiration.  CARDIOVASCULAR: S1, S2 normal. No murmurs, rubs, or gallops.  ABDOMEN: Soft, non-tender, non-distended. Bowel sounds present. No organomegaly or mass.  EXTREMITIES: No pedal edema, cyanosis, or clubbing.  NEUROLOGIC: Cranial nerves II through XII are intact. Muscle strength 5/5 in all extremities. Sensation intact. Gait not checked.  PSYCHIATRIC: The patient is alert and oriented x 3.  SKIN: No obvious rash, lesion, or ulcer.  DATA REVIEW:   CBC  Recent Labs Lab 12/31/15 0413  WBC 7.2  HGB 12.4*  HCT 37.0*  PLT 354    Chemistries   Recent Labs Lab 12/31/15 0413  NA 137  K 2.7*  CL 111  CO2 19*  GLUCOSE 99  BUN 16  CREATININE 0.86  CALCIUM 8.1*  AST 29  ALT 116*  ALKPHOS 77  BILITOT 0.7   Follow-up Information    Follow up with Gabriel Cirriheryl Wicker, NP. Go on 01/07/2016.   Specialties:  Nurse Practitioner, Family Medicine   Why:  at 10:00am Your physical was canceled so that you could have a hospital follow  up and they will reschedule but this appointment is for your hospital follow up. Post Hospital Discharge F/UP   Contact information:   [address] [place] [address] [phone]       Follow up with [facility]. Go on 01/03/2016.   Why:  [facility]   Contact information:   [address] [place] [address] [phone]       Management plans discussed with the patient, family and they are in agreement.  CODE STATUS:  Code Status History    Date Active Date Inactive Code Status Order ID Comments User Context   12/29/2015  7:07 PM 12/31/2015  7:37 PM Full Code [id number]  Enedina FinnerSona Patel, MD Inpatient      TOTAL TIME TAKING CARE OF THIS PATIENT: 45 minutes.    [facility], Kyonna Frier M.D on 01/02/2016 at 4:31 PM  Between 7am to 6pm - Pager - [phone]  After 6pm go to [email or web address] - password [email or web address] [facility]  [facility] [place] [facility]  Office  [phone]  CC: Primary care physician; Gabriel Cirriheryl Wicker, NP  Note: This dictation was prepared with Dragon dictation along with smaller phrase technology. Any transcriptional errors that result from this process are unintentional.

## 2016-01-07 ENCOUNTER — Encounter: Payer: Commercial Managed Care - HMO | Admitting: Unknown Physician Specialty

## 2016-01-07 ENCOUNTER — Inpatient Hospital Stay: Payer: Commercial Managed Care - HMO | Admitting: Unknown Physician Specialty

## 2016-01-11 ENCOUNTER — Telehealth: Payer: Self-pay | Admitting: Unknown Physician Specialty

## 2016-01-11 NOTE — Telephone Encounter (Signed)
The following was on his discharge summary: Follow up with [facility]. Go on 01/03/2016.    Why: [name]   Contact information:   [address] [place] [address] [phone]       No controlled substances from this offece

## 2016-01-11 NOTE — Telephone Encounter (Signed)
Routing to provider  

## 2016-01-11 NOTE — Telephone Encounter (Signed)
Pt called to inquire about his Lorazepam RX. Please call pt ASAP. Thanks.

## 2016-01-11 NOTE — Telephone Encounter (Signed)
No controlled substances from this office.  He will have to get them from psychiatry if they feel it is appropriate

## 2016-01-11 NOTE — Telephone Encounter (Signed)
Called and spoke to patient. I told him what Elnita MaxwellCheryl said and about his discharge summary from the hospital saying that they wanted him to follow up with [facility] on 01/03/16. Patient stated he was not going to psychiatry because "there is nothing wrong with me". I apologized to the patient and he hung the phone up.

## 2016-01-11 NOTE — Telephone Encounter (Signed)
Called and spoke to patient. He states that he was not referred to psychiatry when he was at the hospital.

## 2016-02-07 ENCOUNTER — Other Ambulatory Visit: Payer: Self-pay | Admitting: Unknown Physician Specialty

## 2016-02-10 ENCOUNTER — Other Ambulatory Visit: Payer: Self-pay | Admitting: Unknown Physician Specialty

## 2016-03-17 ENCOUNTER — Other Ambulatory Visit: Payer: Self-pay | Admitting: Unknown Physician Specialty

## 2016-03-17 NOTE — Telephone Encounter (Signed)
Your patient 

## 2016-05-04 ENCOUNTER — Other Ambulatory Visit: Payer: Self-pay | Admitting: Unknown Physician Specialty

## 2016-05-04 NOTE — Telephone Encounter (Signed)
Your patient 

## 2016-06-05 ENCOUNTER — Ambulatory Visit: Payer: Commercial Managed Care - HMO | Admitting: Unknown Physician Specialty

## 2016-07-19 ENCOUNTER — Other Ambulatory Visit: Payer: Self-pay | Admitting: Unknown Physician Specialty

## 2016-08-02 ENCOUNTER — Other Ambulatory Visit: Payer: Self-pay | Admitting: Unknown Physician Specialty

## 2016-08-25 ENCOUNTER — Emergency Department: Payer: Medicare HMO

## 2016-08-25 ENCOUNTER — Emergency Department
Admission: EM | Admit: 2016-08-25 | Discharge: 2016-08-25 | Disposition: A | Discharge status: Home / Self Care | Payer: Medicare HMO | Attending: Emergency Medicine | Admitting: Emergency Medicine

## 2016-08-25 ENCOUNTER — Encounter: Payer: Self-pay | Admitting: Emergency Medicine

## 2016-08-25 DIAGNOSIS — R109 Unspecified abdominal pain: Secondary | ICD-10-CM | POA: Insufficient documentation

## 2016-08-25 DIAGNOSIS — I10 Essential (primary) hypertension: Secondary | ICD-10-CM | POA: Insufficient documentation

## 2016-08-25 DIAGNOSIS — M545 Low back pain, unspecified: Secondary | ICD-10-CM

## 2016-08-25 DIAGNOSIS — Z79899 Other long term (current) drug therapy: Secondary | ICD-10-CM | POA: Insufficient documentation

## 2016-08-25 DIAGNOSIS — Z7982 Long term (current) use of aspirin: Secondary | ICD-10-CM | POA: Insufficient documentation

## 2016-08-25 DIAGNOSIS — F17210 Nicotine dependence, cigarettes, uncomplicated: Secondary | ICD-10-CM | POA: Diagnosis not present

## 2016-08-25 DIAGNOSIS — N200 Calculus of kidney: Secondary | ICD-10-CM | POA: Diagnosis not present

## 2016-08-25 LAB — URINALYSIS, COMPLETE (UACMP) WITH MICROSCOPIC
Bilirubin Urine: NEGATIVE
Glucose, UA: NEGATIVE mg/dL
Ketones, ur: NEGATIVE mg/dL
Leukocytes, UA: NEGATIVE
Nitrite: NEGATIVE
PROTEIN: 30 mg/dL — AB
Specific Gravity, Urine: 1.003 — ABNORMAL LOW (ref 1.005–1.030)
Squamous Epithelial / LPF: NONE SEEN
pH: 6 (ref 5.0–8.0)

## 2016-08-25 LAB — CBC
HCT: 44.6 % (ref 40.0–52.0)
Hemoglobin: 14.9 g/dL (ref 13.0–18.0)
MCH: 32.2 pg (ref 26.0–34.0)
MCHC: 33.3 g/dL (ref 32.0–36.0)
MCV: 96.5 fL (ref 80.0–100.0)
Platelets: 325 10*3/uL (ref 150–440)
RBC: 4.62 MIL/uL (ref 4.40–5.90)
RDW: 14.1 % (ref 11.5–14.5)
WBC: 10.4 10*3/uL (ref 3.8–10.6)

## 2016-08-25 LAB — COMPREHENSIVE METABOLIC PANEL
ALBUMIN: 4.5 g/dL (ref 3.5–5.0)
ALT: 91 U/L — AB (ref 17–63)
AST: 34 U/L (ref 15–41)
Alkaline Phosphatase: 106 U/L (ref 38–126)
Anion gap: 10 (ref 5–15)
BUN: 9 mg/dL (ref 6–20)
CO2: 26 mmol/L (ref 22–32)
CREATININE: 1.07 mg/dL (ref 0.61–1.24)
Calcium: 9.6 mg/dL (ref 8.9–10.3)
Chloride: 102 mmol/L (ref 101–111)
GFR calc Af Amer: >60 mL/min (ref >60)
GLUCOSE: 89 mg/dL (ref 65–99)
POTASSIUM: 3.9 mmol/L (ref 3.5–5.1)
Sodium: 138 mmol/L (ref 135–145)
Total Bilirubin: 0.8 mg/dL (ref 0.3–1.2)
Total Protein: 7.9 g/dL (ref 6.5–8.1)

## 2016-08-25 LAB — LIPASE, BLOOD: LIPASE: 59 U/L — AB (ref 11–51)

## 2016-08-25 MED ORDER — MORPHINE SULFATE (PF) 4 MG/ML IV SOLN
4.0000 mg | Freq: Once | INTRAVENOUS | Status: AC
  Administered 2016-08-25: 4 mg via INTRAVENOUS

## 2016-08-25 MED ORDER — ONDANSETRON HCL 4 MG/2ML IJ SOLN
INTRAMUSCULAR | Status: AC
  Filled 2016-08-25: qty 2

## 2016-08-25 MED ORDER — KETOROLAC TROMETHAMINE 30 MG/ML IJ SOLN
30.0000 mg | Freq: Once | INTRAMUSCULAR | Status: AC
  Administered 2016-08-25: 30 mg via INTRAVENOUS
  Filled 2016-08-25: qty 1

## 2016-08-25 MED ORDER — MORPHINE SULFATE (PF) 4 MG/ML IV SOLN
INTRAVENOUS | Status: AC
  Administered 2016-08-25: 4 mg via INTRAVENOUS
  Filled 2016-08-25: qty 1

## 2016-08-25 MED ORDER — ONDANSETRON HCL 4 MG/2ML IJ SOLN
4.0000 mg | Freq: Once | INTRAMUSCULAR | Status: AC
  Administered 2016-08-25: 4 mg via INTRAVENOUS

## 2016-08-25 MED ORDER — HYDROCODONE-ACETAMINOPHEN 5-325 MG PO TABS: 1.0000 | ORAL_TABLET | ORAL | 0 refills | Status: DC | PRN

## 2016-08-25 NOTE — ED Notes (Signed)
Pt verbalizes understanding of discharge instructions.

## 2016-08-25 NOTE — ED Notes (Signed)
Pt reports pain to right lower back for about 3 days reports urine red in color history of kidney stones, talks in complete sentences no distress noted

## 2016-08-25 NOTE — ED Notes (Signed)
Pt to CT

## 2016-08-25 NOTE — ED Triage Notes (Signed)
Pt reports right side flank pain and blood in urine. Denies NVD.

## 2016-08-25 NOTE — ED Provider Notes (Signed)
[facility] Emergency Department Provider Note   ____________________________________________    I have reviewed the triage vital signs and the nursing notes.   HISTORY  Chief Complaint Flank Pain     HPI Mark [name] is a 56 y.o. male who presents with complaints of back pain. Patient reports bilateral low back pain but does report left-sided flank pain as well. Denies fevers or chills. No dysuria. Does report hematuria. Reports a history of kidney stones. No nausea or vomiting. No sick contacts. No recent travel.   Past Medical History:  Diagnosis Date  . Hypertension   . Stroke [facility])     Patient Active Problem List   Diagnosis Date Noted  . Opiate abuse, continuous 12/30/2015  . Opiate overdose 12/30/2015  . Tylenol toxicity 12/29/2015  . Hematuria 05/03/2015  . Hemiplegia (HCC) 05/03/2015  . Intestinal obstruction 05/03/2015  . 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 Anxiety and depression 05/03/2015  . Hypertension 05/03/2015  . Hyperkalemia 05/03/2015  . Aphasia with stroke (HCC) 05/03/2015  . CVA (cerebral infarction) 05/03/2015    Past Surgical History:  Procedure Laterality Date  . APPENDECTOMY    . bowel obstruction      Prior to Admission medications   Medication Sig Start Date End Date Taking? Authorizing Provider  aspirin 325 MG tablet Take 325 mg by mouth daily.    Historical Provider, MD  citalopram (CELEXA) 40 MG tablet Take 40 mg by mouth at bedtime.    Historical Provider, MD  citalopram (CELEXA) 40 MG tablet TAKE 1 TABLET (40 MG TOTAL) BY MOUTH AT BEDTIME. 03/17/16   Gabriel Cirri, NP  HYDROcodone-acetaminophen (NORCO/VICODIN) 5-325 MG tablet Take 1 tablet by mouth every 4 (four) hours as needed for moderate pain. 08/25/16   Jene Every, MD  lisinopril (PRINIVIL,ZESTRIL) 20 MG tablet Take 20 mg by mouth daily.    Historical Provider, MD  lisinopril (PRINIVIL,ZESTRIL) 20 MG tablet TAKE 1  TABLET (20 MG TOTAL) BY MOUTH EVERY MORNING. 07/19/16   Gabriel Cirri, NP  lisinopril (PRINIVIL,ZESTRIL) 20 MG tablet TAKE 1 TABLET (20 MG TOTAL) BY MOUTH EVERY MORNING. 08/02/16   Gabriel Cirri, NP  LORazepam (ATIVAN) 0.5 MG tablet Take 0.5 mg by mouth every 8 (eight) hours as needed for anxiety.    Historical Provider, MD  metoprolol (LOPRESSOR) 50 MG tablet TAKE 1 TABLET (50 MG TOTAL) BY MOUTH 2 (TWO) TIMES DAILY. 08/02/16   Gabriel Cirri, NP     Allergies Codeine  Family History  Problem Relation Age of Onset  . Heart disease Father   . Seizures Brother     Social History Social History  Substance Use Topics  . Smoking status: Current Every Day Smoker    Packs/day: 1.00    Types: Cigarettes  . Smokeless tobacco: Never Used  . Alcohol use No    Review of Systems  Constitutional: No fever/chills  Cardiovascular: Denies chest pain. Respiratory: Denies shortness of breath. Gastrointestinal: As above Genitourinary: Negative for dysuria. Positive for hematuria Musculoskeletal: As above Skin: Negative for rash. Neurological: Negative for headaches   10-point ROS otherwise negative.  ____________________________________________   PHYSICAL EXAM:  VITAL SIGNS: ED Triage Vitals  Enc Vitals Group     BP 08/25/16 1800 (!) 210/118     Pulse Rate 08/25/16 1800 76     Resp 08/25/16 1800 18     Temp 08/25/16 1800 97.5 F (36.4 C)     Temp Source 08/25/16 1800 Oral  SpO2 08/25/16 1800 99 %     Weight 08/25/16 1758 165 lb (74.8 kg)     Height 08/25/16 1758 5\' 9"  (1.753 m)     Head Circumference --      Peak Flow --      Pain Score 08/25/16 1759 9     Pain Loc --      Pain Edu? --      Excl. in GC? --     Constitutional: Alert and oriented. No acute distress. Pleasant and interactive Eyes: Conjunctivae are normal.   Nose: No congestion/rhinnorhea. Mouth/Throat: Mucous membranes are moist.    Cardiovascular: Normal rate, regular rhythm. Grossly normal heart  sounds.  Good peripheral circulation. Respiratory: Normal respiratory effort.  No retractions. Lungs CTAB. Gastrointestinal: Soft and nontender. No distention.  No CVA tenderness.  Musculoskeletal: Back: No CVA tenderness, no vertebral tenderness to palpation. Mild left paraspinal discomfort, likely muscle spasm at approximately L1. Warm and well perfused Neurologic:  Normal speech and language. No gross focal neurologic deficits are appreciated.  Skin:  Skin is warm, dry and intact. No rash noted. Psychiatric: Mood and affect are normal. Speech and behavior are normal.  ____________________________________________   LABS (all labs ordered are listed, but only abnormal results are displayed)  Labs Reviewed  URINALYSIS, COMPLETE (UACMP) WITH MICROSCOPIC - Abnormal; Notable for the following:       Result Value   Color, Urine YELLOW (*)    APPearance CLEAR (*)    Specific Gravity, Urine 1.003 (*)    Hgb urine dipstick LARGE (*)    Protein, ur 30 (*)    Bacteria, UA RARE (*)    All other components within normal limits  COMPREHENSIVE METABOLIC PANEL - Abnormal; Notable for the following:    ALT 91 (*)    All other components within normal limits  LIPASE, BLOOD - Abnormal; Notable for the following:    Lipase 59 (*)    All other components within normal limits  CBC   ____________________________________________  EKG  None ____________________________________________  RADIOLOGY  CT scan shows nonobstructing stones in the interpolar right kidney ____________________________________________   PROCEDURES  Procedure(s) performed: No    Critical Care performed:No ____________________________________________   INITIAL IMPRESSION / ASSESSMENT AND PLAN / ED COURSE  Pertinent labs & imaging results that were available during my care of the patient were reviewed by me and considered in my medical decision making (see chart for details).  Patient present with back pain  left greater than right, given hematuria suspicious for kidney stones. Treated with IV Toradol with little improvement, significant improvement with morphine and Zofran.  Clinical Course   CT scan does not demonstrate any obstructive stones. Patient feels significantly better. Blood pressure is improved. I suspect pain is related to muscular skeletal pain, we will treat symptoms and have the patient follow-up with PCP. Return precautions discussed ____________________________________________   FINAL CLINICAL IMPRESSION(S) / ED DIAGNOSES  Final diagnoses:  Flank pain, acute  Acute bilateral low back pain without sciatica      NEW MEDICATIONS STARTED DURING THIS VISIT:  New Prescriptions   HYDROCODONE-ACETAMINOPHEN (NORCO/VICODIN) 5-325 MG TABLET    Take 1 tablet by mouth every 4 (four) hours as needed for moderate pain.     Note:  This document was prepared using Dragon voice recognition software and may include unintentional dictation errors.    Jene Everyobert Jilene Spohr, MD 08/25/16 2141

## 2016-09-06 ENCOUNTER — Other Ambulatory Visit: Payer: Self-pay | Admitting: Unknown Physician Specialty

## 2016-10-31 ENCOUNTER — Other Ambulatory Visit: Payer: Self-pay | Admitting: Unknown Physician Specialty

## 2016-11-20 ENCOUNTER — Telehealth: Payer: Self-pay

## 2016-11-20 NOTE — Telephone Encounter (Signed)
Attempted to reach to schedule for Medicare Wellness. Message left for pt to return call.   

## 2016-11-22 ENCOUNTER — Ambulatory Visit (INDEPENDENT_AMBULATORY_CARE_PROVIDER_SITE_OTHER): Payer: Medicare HMO | Admitting: Unknown Physician Specialty

## 2016-11-22 ENCOUNTER — Encounter: Payer: Self-pay | Admitting: Unknown Physician Specialty

## 2016-11-22 DIAGNOSIS — R7401 Elevation of levels of liver transaminase levels: Secondary | ICD-10-CM | POA: Insufficient documentation

## 2016-11-22 DIAGNOSIS — R740 Nonspecific elevation of levels of transaminase and lactic acid dehydrogenase [LDH]: Secondary | ICD-10-CM

## 2016-11-22 DIAGNOSIS — F418 Other specified anxiety disorders: Secondary | ICD-10-CM

## 2016-11-22 DIAGNOSIS — I10 Essential (primary) hypertension: Secondary | ICD-10-CM | POA: Diagnosis not present

## 2016-11-22 DIAGNOSIS — F419 Anxiety disorder, unspecified: Secondary | ICD-10-CM

## 2016-11-22 DIAGNOSIS — F329 Major depressive disorder, single episode, unspecified: Secondary | ICD-10-CM

## 2016-11-22 MED ORDER — CITALOPRAM HYDROBROMIDE 40 MG PO TABS: 40.0000 mg | ORAL_TABLET | Freq: Every day | ORAL | 5 refills | Status: DC

## 2016-11-22 MED ORDER — METOPROLOL TARTRATE 50 MG PO TABS: 50.0000 mg | ORAL_TABLET | Freq: Two times a day (BID) | ORAL | 5 refills | Status: DC

## 2016-11-22 MED ORDER — LISINOPRIL 20 MG PO TABS: 20.0000 mg | ORAL_TABLET | Freq: Every day | ORAL | 6 refills | Status: DC

## 2016-11-22 NOTE — Assessment & Plan Note (Addendum)
Discussed avoidance of Tylenol.  Avoid ETOH.  Check levels today

## 2016-11-22 NOTE — Assessment & Plan Note (Signed)
Stable, continue present medications.   No labs for a period of time.  Will check today

## 2016-11-22 NOTE — Progress Notes (Signed)
BP 103/72 (BP Location: Left Arm, Patient Position: Sitting, Cuff Size: Normal)   Pulse 86   Temp 97.9 F (36.6 C)   Ht  (1.676 m)   Wt 162 lb 8 oz (73.7 kg)   SpO2 98%   BMI 26.23 kg/m    Subjective:    Patient ID: Mark [name], male    DOB: Jan 12, 1961, 56 y.o.   MRN: [id number]  HPI: Mark [name] is a 56 y.o. male  Chief Complaint  Patient presents with  . Anxiety  . Hypertension   Hypertension Using medications without difficulty Average home BPs   No problems or lightheadedness No chest pain with exertion or shortness of breath No Edema  Depression Seems poorly controlled at this time.  Pt states "I wish you hadn't taken me off those Lorazepam."  It was recommended at the time he go to psychiatry with a visit arranged last time he was in the hospital.  See discharge summary 5/19 with concern of substance abuse.   Depression screen [facility] 2/9 11/22/2016 07/26/2015  Decreased Interest 2 0  Down, Depressed, Hopeless 2 0  PHQ - 2 Score 4 0  Altered sleeping 2 -  Tired, decreased energy 3 -  Change in appetite 2 -  Feeling bad or failure about yourself  2 -  Trouble concentrating 3 -  Moving slowly or fidgety/restless 2 -  Suicidal thoughts 0 -  PHQ-9 Score 18 -   Elevated liver enzymes Noted on last blood draw/  Social History   Social History  . Marital status: Divorced    Spouse name: N/A  . Number of children: N/A  . Years of education: N/A   Occupational History  . Not on file.   Social History Main Topics  . Smoking status: Current Every Day Smoker    Packs/day: 1.00    Types: Cigarettes  . Smokeless tobacco: Never Used  . Alcohol use No  . Drug use: No  . Sexual activity: Not on file   Other Topics Concern  . Not on file   Social History Narrative  . No narrative on file   Family History  Problem Relation Age of Onset  . Heart disease Father   . Seizures Brother    Past Medical History:  Diagnosis Date  . Hypertension    . Stroke [facility])    Past Surgical History:  Procedure Laterality Date  . APPENDECTOMY    . bowel obstruction        Relevant past medical, surgical, family and social history reviewed and updated as indicated. Interim medical history since our last visit reviewed. Allergies and medications reviewed and updated.  Review of Systems  Per HPI unless specifically indicated above     Objective:    BP 103/72 (BP Location: Left Arm, Patient Position: Sitting, Cuff Size: Normal)   Pulse 86   Temp 97.9 F (36.6 C)   Ht  (1.676 m)   Wt 162 lb 8 oz (73.7 kg)   SpO2 98%   BMI 26.23 kg/m   Wt Readings from Last 3 Encounters:  11/22/16 162 lb 8 oz (73.7 kg)  08/25/16 165 lb (74.8 kg)  12/30/15 160 lb (72.6 kg)    Physical Exam  Constitutional: He is oriented to person, place, and time. He appears well-developed and well-nourished. No distress.  HENT:  Head: Normocephalic and atraumatic.  Eyes: Conjunctivae and lids are normal. Right eye exhibits no discharge. Left eye exhibits no discharge. No  scleral icterus.  Neck: Normal range of motion. Neck supple. No JVD present. Carotid bruit is not present.  Cardiovascular: Normal rate, regular rhythm and normal heart sounds.   Pulmonary/Chest: Effort normal and breath sounds normal. No respiratory distress.  Abdominal: Normal appearance. There is no splenomegaly or hepatomegaly.  Musculoskeletal: Normal range of motion.  Neurological: He is alert and oriented to person, place, and time.  Skin: Skin is warm, dry and intact. No rash noted. No pallor.  Psychiatric: He has a normal mood and affect. His behavior is normal. Judgment and thought content normal.    Results for orders placed or performed during the hospital encounter of 08/25/16  Urinalysis, Complete w Microscopic  Result Value Ref Range   Color, Urine YELLOW (A) YELLOW   APPearance CLEAR (A) CLEAR   Specific Gravity, Urine 1.003 (L) 1.005 - 1.030   pH 6.0 5.0 - 8.0    Glucose, UA NEGATIVE NEGATIVE mg/dL   Hgb urine dipstick LARGE (A) NEGATIVE   Bilirubin Urine NEGATIVE NEGATIVE   Ketones, ur NEGATIVE NEGATIVE mg/dL   Protein, ur 30 (A) NEGATIVE mg/dL   Nitrite NEGATIVE NEGATIVE   Leukocytes, UA NEGATIVE NEGATIVE   RBC / HPF TOO NUMEROUS TO COUNT 0 - 5 RBC/hpf   WBC, UA 0-5 0 - 5 WBC/hpf   Bacteria, UA RARE (A) NONE SEEN   Squamous Epithelial / LPF NONE SEEN NONE SEEN  CBC  Result Value Ref Range   WBC 10.4 3.8 - 10.6 K/uL   RBC 4.62 4.40 - 5.90 MIL/uL   Hemoglobin 14.9 13.0 - 18.0 g/dL   HCT 16.1 09.6 - 04.5 %   MCV 96.5 80.0 - 100.0 fL   MCH 32.2 26.0 - 34.0 pg   MCHC 33.3 32.0 - 36.0 g/dL   RDW 40.9 81.1 - 91.4 %   Platelets 325 150 - 440 K/uL  Comprehensive metabolic panel  Result Value Ref Range   Sodium 138 135 - 145 mmol/L   Potassium 3.9 3.5 - 5.1 mmol/L   Chloride 102 101 - 111 mmol/L   CO2 26 22 - 32 mmol/L   Glucose, Bld 89 65 - 99 mg/dL   BUN 9 6 - 20 mg/dL   Creatinine, Ser 7.82 0.61 - 1.24 mg/dL   Calcium 9.6 8.9 - 95.6 mg/dL   Total Protein 7.9 6.5 - 8.1 g/dL   Albumin 4.5 3.5 - 5.0 g/dL   AST 34 15 - 41 U/L   ALT 91 (H) 17 - 63 U/L   Alkaline Phosphatase 106 38 - 126 U/L   Total Bilirubin 0.8 0.3 - 1.2 mg/dL   GFR calc non Af Amer >60 >60 mL/min   GFR calc Af Amer >60 >60 mL/min   Anion gap 10 5 - 15  Lipase, blood  Result Value Ref Range   Lipase 59 (H) 11 - 51 U/L      Assessment & Plan:   Problem List Items Addressed This Visit      Unprioritized   Anxiety and depression    Worsening.  Not to goal.  Denies suicidal ideation.  Refusing psychiatry.  No controlled substances but refilled Citalopram      Elevated transaminase level    Discussed avoidance of Tylenol.  Avoid ETOH.  Check levels today      Relevant Orders   Comprehensive metabolic panel   Hypertension    Stable, continue present medications.   No labs for a period of time.  Will check today  Relevant Medications   lisinopril  (PRINIVIL,ZESTRIL) 20 MG tablet   metoprolol (LOPRESSOR) 50 MG tablet   Other Relevant Orders   Comprehensive metabolic panel   Lipid Panel w/o Chol/HDL Ratio       Follow up plan: Return in about 6 months (around 05/24/2017) for schedule PE.

## 2016-11-22 NOTE — Assessment & Plan Note (Addendum)
Worsening.  Not to goal.  Denies suicidal ideation.  Refusing psychiatry.  No controlled substances but refilled Citalopram

## 2016-11-23 LAB — COMPREHENSIVE METABOLIC PANEL
A/G RATIO: 1.8 (ref 1.2–2.2)
ALBUMIN: 4.6 g/dL (ref 3.5–5.5)
ALK PHOS: 90 IU/L (ref 39–117)
ALT: 49 IU/L — ABNORMAL HIGH (ref 0–44)
AST: 27 IU/L (ref 0–40)
BILIRUBIN TOTAL: 0.2 mg/dL (ref 0.0–1.2)
BUN / CREAT RATIO: 12 (ref 9–20)
BUN: 14 mg/dL (ref 6–24)
CHLORIDE: 100 mmol/L (ref 96–106)
CO2: 23 mmol/L (ref 18–29)
Calcium: 9.8 mg/dL (ref 8.7–10.2)
Creatinine, Ser: 1.18 mg/dL (ref 0.76–1.27)
GFR calc Af Amer: 79 mL/min/{1.73_m2} (ref >59)
GFR calc non Af Amer: 69 mL/min/{1.73_m2} (ref >59)
GLUCOSE: 148 mg/dL — AB (ref 65–99)
Globulin, Total: 2.5 g/dL (ref 1.5–4.5)
POTASSIUM: 4.4 mmol/L (ref 3.5–5.2)
Sodium: 141 mmol/L (ref 134–144)
Total Protein: 7.1 g/dL (ref 6.0–8.5)

## 2016-11-23 LAB — LIPID PANEL W/O CHOL/HDL RATIO
CHOLESTEROL TOTAL: 238 mg/dL — AB (ref 100–199)
HDL: 38 mg/dL — ABNORMAL LOW (ref >39)
LDL Calculated: 124 mg/dL — ABNORMAL HIGH (ref 0–99)
Triglycerides: 378 mg/dL — ABNORMAL HIGH (ref 0–149)
VLDL Cholesterol Cal: 76 mg/dL — ABNORMAL HIGH (ref 5–40)

## 2016-11-24 ENCOUNTER — Encounter: Payer: Self-pay | Admitting: Unknown Physician Specialty

## 2016-12-05 ENCOUNTER — Other Ambulatory Visit: Payer: Self-pay | Admitting: Unknown Physician Specialty

## 2016-12-05 ENCOUNTER — Telehealth: Payer: Self-pay | Admitting: Unknown Physician Specialty

## 2016-12-05 NOTE — Telephone Encounter (Signed)
Called and a man answered the phone. I asked to speak to the patient and the ma stated that the patient was not in. I asked for the patient to return my call when he could.

## 2016-12-05 NOTE — Telephone Encounter (Signed)
Called pharmacy because according to chart, medication was sent in 11/22/16 for a 6 month supply. Pharmacy stated that patient was just given a 3 month supply on 11/22/16. Will call patient and see what is going on.

## 2016-12-05 NOTE — Telephone Encounter (Signed)
Patient's mother returned call. She stated that she found the 90 day supply of patient's citalopram, states she put it in a different place than usual.

## 2016-12-05 NOTE — Telephone Encounter (Signed)
Patient returned my call and I explained to him what was going on. Patient handed the phone to his mother because he states that she handles his medications. I explained the situation to his mother and she stated that she could not find the patient's medication. I asked if she may have put them in a different location than usual and mother stated that she would look around for the medication and call us back.

## 2016-12-05 NOTE — Telephone Encounter (Signed)
Patient needs refill on his Citalopram  sent in to [facility]

## 2016-12-15 ENCOUNTER — Encounter: Payer: Self-pay | Admitting: Unknown Physician Specialty

## 2016-12-15 ENCOUNTER — Ambulatory Visit (INDEPENDENT_AMBULATORY_CARE_PROVIDER_SITE_OTHER): Payer: Medicare HMO | Admitting: Unknown Physician Specialty

## 2016-12-15 DIAGNOSIS — R4189 Other symptoms and signs involving cognitive functions and awareness: Secondary | ICD-10-CM | POA: Diagnosis not present

## 2016-12-15 DIAGNOSIS — F1110 Opioid abuse, uncomplicated: Secondary | ICD-10-CM | POA: Diagnosis not present

## 2016-12-15 NOTE — Assessment & Plan Note (Addendum)
Number and location of [facility] given along description of services.  He is not an acute danger to himself.  He and him mom agree to go over this afternoon

## 2016-12-15 NOTE — Progress Notes (Signed)
   BP 100/76   Pulse 68   Temp 98.7 F (37.1 C)   Wt 161 lb (73 kg)   SpO2 100%   BMI 25.99 kg/m    Subjective:    Patient ID: Mark [name], male    DOB: 07-24-1961, 56 y.o.   MRN: [id number]  HPI: Mark BandaRodney D Rickel is a 56 y.o. male  Chief Complaint  Patient presents with  . Drug Problem    pt states he is here to discuss drug addiction, pt states he has been addicted since 2002 and has been taking percocet, morphine, and neurontin   Pt states he want to be seen because he is "hooked on pills."  States he has been taking them since 2002.  States he gets the pills off the streets and takes "all I can" which is a bunch.  Denies suicidal ideation.  States he has never done injectables or have been comatose.  He was given a number for [facility] for  assessment.  He never did go to [facility] but is willing.    During the visit.  The mom came in and states he is depressed and smokes a pack/day.    Relevant past medical, surgical, family and social history reviewed and updated as indicated. Interim medical history since our last visit reviewed. Allergies and medications reviewed and updated.  Review of Systems  Per HPI unless specifically indicated above     Objective:    BP 100/76   Pulse 68   Temp 98.7 F (37.1 C)   Wt 161 lb (73 kg)   SpO2 100%   BMI 25.99 kg/m   Wt Readings from Last 3 Encounters:  12/15/16 161 lb (73 kg)  11/22/16 162 lb 8 oz (73.7 kg)  08/25/16 165 lb (74.8 kg)    Physical Exam  Constitutional: He is oriented to person, place, and time. He appears well-developed and well-nourished. No distress.  HENT:  Head: Normocephalic and atraumatic.  Eyes: Conjunctivae and lids are normal. Right eye exhibits no discharge. Left eye exhibits no discharge. No scleral icterus.  Cardiovascular: Normal rate.   Pulmonary/Chest: Effort normal.  Abdominal: Normal appearance. There is no splenomegaly or hepatomegaly.  Musculoskeletal: Normal range of motion.  Neurological:  He is alert and oriented to person, place, and time.  Skin: Skin is intact. No rash noted. No pallor.  Psychiatric: He has a normal mood and affect. His behavior is normal. Judgment and thought content normal.     Assessment & Plan:   Problem List Items Addressed This Visit      Unprioritized   Cognitive impairment    Noting frequent repetitive questioning.  ? Related to substances (too Neurontin today) or early vascular dementia due to strokes.  Will assess further next visit.        Opiate abuse, continuous    Number and location of [facility] given along description of services.  He is not an acute danger to himself.  He and him mom agree to go over this afternoon          Follow up plan: Return for with [facility].

## 2016-12-15 NOTE — Assessment & Plan Note (Addendum)
Noting frequent repetitive questioning.  ? Related to substances (too Neurontin today) or early vascular dementia due to strokes.  Will assess further next visit.

## 2016-12-21 ENCOUNTER — Telehealth: Payer: Self-pay | Admitting: Unknown Physician Specialty

## 2016-12-21 NOTE — Telephone Encounter (Signed)
Patient's mother called in regards to seeing if patient could get a prescription for his anxiety and depression. Patient's mother states that patient haas been going some where for his mood and they staff advised her that patient will need to get a prescription for anxiety and depression filled from their primary care provider. Patient's mother also asked if someone could call her about the medication or when it is filled.  Please Advise.   Patient's mother contact number: [phone]  Thank you

## 2016-12-21 NOTE — Telephone Encounter (Signed)
Routing to provider  

## 2016-12-22 NOTE — Telephone Encounter (Signed)
Patient has been to [facility] 3 times this week.  Patient's mother stated he won't go back there for another 2 weeks and they haven't filled any medications.

## 2016-12-22 NOTE — Telephone Encounter (Signed)
I can't prescribe anything besides the Citalopram.  If he feels like he needs something, he should call them.

## 2016-12-22 NOTE — Telephone Encounter (Signed)
He is already taking Citalopram which is for mood and anxeity.  The place he is going, hopefully [facility],  should be prescribing all medications for this.

## 2016-12-25 NOTE — Telephone Encounter (Signed)
Called and spoke to patient. I let him know what Elnita MaxwellCheryl said and patient stated "OK".

## 2017-02-26 ENCOUNTER — Other Ambulatory Visit: Payer: Self-pay | Admitting: Unknown Physician Specialty

## 2017-04-25 ENCOUNTER — Ambulatory Visit: Payer: Medicare HMO

## 2017-05-15 ENCOUNTER — Other Ambulatory Visit: Payer: Self-pay | Admitting: Unknown Physician Specialty

## 2017-05-16 ENCOUNTER — Telehealth: Payer: Self-pay | Admitting: Unknown Physician Specialty

## 2017-05-16 NOTE — Telephone Encounter (Signed)
Called pt to sched for Annual Wellness Visit with Nurse Health Advisor. C/b #  [phone]  Kathryn Brown ° °

## 2017-05-17 NOTE — Telephone Encounter (Signed)
Called patient to schedule medicare annual wellness visit. Left message for patient to call back.

## 2017-05-30 ENCOUNTER — Ambulatory Visit (INDEPENDENT_AMBULATORY_CARE_PROVIDER_SITE_OTHER): Payer: Medicare HMO

## 2017-05-30 VITALS — BP 134/88 | HR 58 | Temp 98.0°F | Resp 17 | Ht 67.0 in | Wt 160.8 lb

## 2017-05-30 DIAGNOSIS — Z0000 Encounter for general adult medical examination without abnormal findings: Secondary | ICD-10-CM

## 2017-05-30 NOTE — Progress Notes (Signed)
Subjective:   Mark [name] is a 56 y.o. male who presents for an Initial Medicare Annual Wellness Visit.  Review of Systems  Cardiac Risk Factors include: male gender;advanced age (>7655men, 7>65 women);hypertension;sedentary lifestyle;smoking/ tobacco exposure    Objective:    Today's Vitals   05/30/17 1511 05/30/17 1522  BP: (!) 141/92 134/88  Pulse: (!) 58   Resp: 17   Temp: 98 F (36.7 C)   TempSrc: Oral   Weight: 160 lb 12.8 oz (72.9 kg)   Height: 5\' 7"  (1.702 m)    Body mass index is 25.18 kg/m.  Current Medications (verified) Outpatient Encounter Prescriptions as of 05/30/2017  Medication Sig  . aspirin 325 MG tablet Take 325 mg by mouth daily.  . citalopram (CELEXA) 40 MG tablet Take 1 tablet (40 mg total) by mouth daily.  Marland Kitchen. lisinopril (PRINIVIL,ZESTRIL) 20 MG tablet Take 1 tablet (20 mg total) by mouth daily.  . metoprolol tartrate (LOPRESSOR) 50 MG tablet TAKE 1 TABLET (50 MG TOTAL) BY MOUTH 2 (TWO) TIMES DAILY.  . [DISCONTINUED] citalopram (CELEXA) 40 MG tablet TAKE 1 TABLET (40 MG TOTAL) BY MOUTH AT BEDTIME. (Patient not taking: Reported on 05/30/2017)   No facility-administered encounter medications on file as of 05/30/2017.     Allergies (verified) Codeine   History: Past Medical History:  Diagnosis Date  . Hypertension   . Stroke [facility](HCC)    Past Surgical History:  Procedure Laterality Date  . APPENDECTOMY    . bowel obstruction     Family History  Problem Relation Age of Onset  . Heart disease Father   . Seizures Brother    Social History   Occupational History  . Not on file.   Social History Main Topics  . Smoking status: Current Every Day Smoker    Packs/day: 1.00    Types: Cigarettes  . Smokeless tobacco: Never Used  . Alcohol use No  . Drug use: No  . Sexual activity: Not on file   Tobacco Counseling Ready to quit: No Counseling given: Yes   Activities of Daily Living In your present state of health, do you have any  difficulty performing the following activities: 05/30/2017  Hearing? N  Vision? N  Difficulty concentrating or making decisions? N  Walking or climbing stairs? N  Dressing or bathing? N  Doing errands, shopping? N  Preparing Food and eating ? N  Using the Toilet? N  In the past six months, have you accidently leaked urine? N  Do you have problems with loss of bowel control? N  Managing your Medications? N  Managing your Finances? N  Housekeeping or managing your Housekeeping? N  Some recent data might be hidden    Immunizations and Health Maintenance Immunization History  Administered Date(s) Administered  . Tdap 05/17/2015   There are no preventive care reminders to display for this patient.  Patient Care Team: Gabriel CirriWicker, Cheryl, NP as PCP - General (Nurse Practitioner) Gabriel CirriWicker, Cheryl, NP as PCP - Family Medicine (Nurse Practitioner)  Indicate any recent Medical Services you may have received from other than [facility] providers in the past year (date may be approximate).    Assessment:   This is a routine wellness examination for Mark [name].   Hearing/Vision screen Vision Screening Comments: Doesn't see eye doctor currently   Dietary issues and exercise activities discussed: Current Exercise Habits: The patient does not participate in regular exercise at present, Exercise limited by: None identified  Goals    . Quit smoking /  using tobacco          Smoking cessation discussed      Depression Screen PHQ 2/9 Scores 05/30/2017 11/22/2016 07/26/2015  PHQ - 2 Score 0 4 0  PHQ- 9 Score - 18 -    Fall Risk Fall Risk  05/30/2017  Falls in the past year? No    Cognitive Function:     6CIT Screen 05/30/2017  What Year? 0 points  What month? 0 points  What time? 0 points  Count back from 20 0 points  Months in reverse 0 points  Repeat phrase 2 points  Total Score 2    Screening Tests Health Maintenance  Topic Date Due  . Hepatitis C Screening  11/22/2017 (Originally  May 07, 1961)  . HIV Screening  11/22/2017 (Originally 04/28/1975)  . INFLUENZA VACCINE  05/30/2018 (Originally 03/14/2017)  . COLONOSCOPY  05/30/2018 (Originally 04/27/2010)  . TETANUS/TDAP  05/16/2025        Plan:    I have personally reviewed and addressed the Medicare Annual Wellness questionnaire and have noted the following in the patient's chart:  A. Medical and social history B. Use of alcohol, tobacco or illicit drugs  C. Current medications and supplements D. Functional ability and status E.  Nutritional status F.  Physical activity G. Advance directives H. List of other physicians I.  Hospitalizations, surgeries, and ER visits in previous 12 months J.  Vitals K. Screenings such as hearing and vision if needed, cognitive and depression L. Referrals and appointments   In addition, I have reviewed and discussed with patient certain preventive protocols, quality metrics, and best practice recommendations. A written personalized care plan for preventive services as well as general preventive health recommendations were provided to patient.   Signed,  Marin Roberts, LPN Nurse Health Advisor   MD Recommendations: none

## 2017-05-30 NOTE — Patient Instructions (Addendum)
Mark [name] , Thank you for taking time to come for your Medicare Wellness Visit. I appreciate your ongoing commitment to your health goals. Please review the following plan we discussed and let me know if I can assist you in the future.   Screening recommendations/referrals: Colonoscopy: due now-declined Recommended yearly ophthalmology/optometry visit for glaucoma screening and checkup Recommended yearly dental visit for hygiene and checkup  Vaccinations: Influenza vaccine: due now-declined Pneumococcal vaccine: due at age 55 Tdap vaccine: up to date Shingles vaccine: due, check with your insurance company for coverage  Advanced directives: Advance directive discussed with you today. Even though you declined this today please call our office should you change your mind and we can give you the proper paperwork for you to fill out.  Conditions/risks identified: smoking cessation discussed   Next appointment: Follow up in one year for your annual wellness exam.   Preventive Care 40-64 Years, Male Preventive care refers to lifestyle choices and visits with your health care provider that can promote health and wellness. What does preventive care include?  A yearly physical exam. This is also called an annual well check.  Dental exams once or twice a year.  Routine eye exams. Ask your health care provider how often you should have your eyes checked.  Personal lifestyle choices, including:  Daily care of your teeth and gums.  Regular physical activity.  Eating a healthy diet.  Avoiding tobacco and drug use.  Limiting alcohol use.  Practicing safe sex.  Taking low-dose aspirin every day starting at age 27. What happens during an annual well check? The services and screenings done by your health care provider during your annual well check will depend on your age, overall health, lifestyle risk factors, and family history of disease. Counseling  Your health care provider may  ask you questions about your:  Alcohol use.  Tobacco use.  Drug use.  Emotional well-being.  Home and relationship well-being.  Sexual activity.  Eating habits.  Work and work Astronomer. Screening  You may have the following tests or measurements:  Height, weight, and BMI.  Blood pressure.  Lipid and cholesterol levels. These may be checked every 5 years, or more frequently if you are over 67 years old.  Skin check.  Lung cancer screening. You may have this screening every year starting at age 42 if you have a 30-pack-year history of smoking and currently smoke or have quit within the past 15 years.  Fecal occult blood test (FOBT) of the stool. You may have this test every year starting at age 34.  Flexible sigmoidoscopy or colonoscopy. You may have a sigmoidoscopy every 5 years or a colonoscopy every 10 years starting at age 22.  Prostate cancer screening. Recommendations will vary depending on your family history and other risks.  Hepatitis C blood test.  Hepatitis B blood test.  Sexually transmitted disease (STD) testing.  Diabetes screening. This is done by checking your blood sugar (glucose) after you have not eaten for a while (fasting). You may have this done every 1-3 years. Discuss your test results, treatment options, and if necessary, the need for more tests with your health care provider. Vaccines  Your health care provider may recommend certain vaccines, such as:  Influenza vaccine. This is recommended every year.  Tetanus, diphtheria, and acellular pertussis (Tdap, Td) vaccine. You may need a Td booster every 10 years.  Zoster vaccine. You may need this after age 32.  Pneumococcal 13-valent conjugate (PCV13) vaccine. You may need  this if you have certain conditions and have not been vaccinated.  Pneumococcal polysaccharide (PPSV23) vaccine. You may need one or two doses if you smoke cigarettes or if you have certain conditions. Talk to your  health care provider about which screenings and vaccines you need and how often you need them. This information is not intended to replace advice given to you by your health care provider. Make sure you discuss any questions you have with your health care provider. Document Released: 08/27/2015 Document Revised: 04/19/2016 Document Reviewed: 06/01/2015 Elsevier Interactive Patient Education  2017 [employer].  Fall Prevention in the Home Falls can cause injuries. They can happen to people of all ages. There are many things you can do to make your home safe and to help prevent falls. What can I do on the outside of my home?  Regularly fix the edges of walkways and driveways and fix any cracks.  Remove anything that might make you trip as you walk through a door, such as a raised step or threshold.  Trim any bushes or trees on the path to your home.  Use bright outdoor lighting.  Clear any walking paths of anything that might make someone trip, such as rocks or tools.  Regularly check to see if handrails are loose or broken. Make sure that both sides of any steps have handrails.  Any raised decks and porches should have guardrails on the edges.  Have any leaves, snow, or ice cleared regularly.  Use sand or salt on walking paths during winter.  Clean up any spills in your garage right away. This includes oil or grease spills. What can I do in the bathroom?  Use night lights.  Install grab bars by the toilet and in the tub and shower. Do not use towel bars as grab bars.  Use non-skid mats or decals in the tub or shower.  If you need to sit down in the shower, use a plastic, non-slip stool.  Keep the floor dry. Clean up any water that spills on the floor as soon as it happens.  Remove soap buildup in the tub or shower regularly.  Attach bath mats securely with double-sided non-slip rug tape.  Do not have throw rugs and other things on the floor that can make you trip. What  can I do in the bedroom?  Use night lights.  Make sure that you have a light by your bed that is easy to reach.  Do not use any sheets or blankets that are too big for your bed. They should not hang down onto the floor.  Have a firm chair that has side arms. You can use this for support while you get dressed.  Do not have throw rugs and other things on the floor that can make you trip. What can I do in the kitchen?  Clean up any spills right away.  Avoid walking on wet floors.  Keep items that you use a lot in easy-to-reach places.  If you need to reach something above you, use a strong step stool that has a grab bar.  Keep electrical cords out of the way.  Do not use floor polish or wax that makes floors slippery. If you must use wax, use non-skid floor wax.  Do not have throw rugs and other things on the floor that can make you trip. What can I do with my stairs?  Do not leave any items on the stairs.  Make sure that there are handrails  on both sides of the stairs and use them. Fix handrails that are broken or loose. Make sure that handrails are as long as the stairways.  Check any carpeting to make sure that it is firmly attached to the stairs. Fix any carpet that is loose or worn.  Avoid having throw rugs at the top or bottom of the stairs. If you do have throw rugs, attach them to the floor with carpet tape.  Make sure that you have a light switch at the top of the stairs and the bottom of the stairs. If you do not have them, ask someone to add them for you. What else can I do to help prevent falls?  Wear shoes that:  Do not have high heels.  Have rubber bottoms.  Are comfortable and fit you well.  Are closed at the toe. Do not wear sandals.  If you use a stepladder:  Make sure that it is fully opened. Do not climb a closed stepladder.  Make sure that both sides of the stepladder are locked into place.  Ask someone to hold it for you, if  possible.  Clearly mark and make sure that you can see:  Any grab bars or handrails.  First and last steps.  Where the edge of each step is.  Use tools that help you move around (mobility aids) if they are needed. These include:  Canes.  Walkers.  Scooters.  Crutches.  Turn on the lights when you go into a dark area. Replace any light bulbs as soon as they burn out.  Set up your furniture so you have a clear path. Avoid moving your furniture around.  If any of your floors are uneven, fix them.  If there are any pets around you, be aware of where they are.  Review your medicines with your doctor. Some medicines can make you feel dizzy. This can increase your chance of falling. Ask your doctor what other things that you can do to help prevent falls. This information is not intended to replace advice given to you by your health care provider. Make sure you discuss any questions you have with your health care provider. Document Released: 05/27/2009 Document Revised: 01/06/2016 Document Reviewed: 09/04/2014 Elsevier Interactive Patient Education  2017 [employer].    Steps to Quit Smoking Smoking tobacco can be bad for your health. It can also affect almost every organ in your body. Smoking puts you and people around you at risk for many serious long-lasting (chronic) diseases. Quitting smoking is hard, but it is one of the best things that you can do for your health. It is never too late to quit. What are the benefits of quitting smoking? When you quit smoking, you lower your risk for getting serious diseases and conditions. They can include:  Lung cancer or lung disease.  Heart disease.  Stroke.  Heart attack.  Not being able to have children (infertility).  Weak bones (osteoporosis) and broken bones (fractures).  If you have coughing, wheezing, and shortness of breath, those symptoms may get better when you quit. You may also get sick less often. If you are  pregnant, quitting smoking can help to lower your chances of having a baby of low birth weight. What can I do to help me quit smoking? Talk with your doctor about what can help you quit smoking. Some things you can do (strategies) include:  Quitting smoking totally, instead of slowly cutting back how much you smoke over a period of  time.  Going to in-person counseling. You are more likely to quit if you go to many counseling sessions.  Using resources and support systems, such as: ? Agricultural engineernline chats with a Veterinary surgeoncounselor. ? Phone quitlines. ? Automotive engineerrinted self-help materials. ? Support groups or group counseling. ? Text messaging programs. ? Mobile phone apps or applications.  Taking medicines. Some of these medicines may have nicotine in them. If you are pregnant or breastfeeding, do not take any medicines to quit smoking unless your doctor says it is okay. Talk with your doctor about counseling or other things that can help you.  Talk with your doctor about using more than one strategy at the same time, such as taking medicines while you are also going to in-person counseling. This can help make quitting easier. What things can I do to make it easier to quit? Quitting smoking might feel very hard at first, but there is a lot that you can do to make it easier. Take these steps:  Talk to your family and friends. Ask them to support and encourage you.  Call phone quitlines, reach out to support groups, or work with a Veterinary surgeoncounselor.  Ask people who smoke to not smoke around you.  Avoid places that make you want (trigger) to smoke, such as: ? Bars. ? Parties. ? Smoke-break areas at work.  Spend time with people who do not smoke.  Lower the stress in your life. Stress can make you want to smoke. Try these things to help your stress: ? Getting regular exercise. ? Deep-breathing exercises. ? Yoga. ? Meditating. ? Doing a body scan. To do this, close your eyes, focus on one area of your body at a time  from head to toe, and notice which parts of your body are tense. Try to relax the muscles in those areas.  Download or buy apps on your mobile phone or tablet that can help you stick to your quit plan. There are many free apps, such as QuitGuide from the [employer] developer(Centers for Disease Control and Prevention). You can find more support from smokefree.gov and other websites.  This information is not intended to replace advice given to you by your health care provider. Make sure you discuss any questions you have with your health care provider. Document Released: 05/27/2009 Document Revised: 03/28/2016 Document Reviewed: 12/15/2014 Elsevier Interactive Patient Education  2018 [employer]

## 2017-06-19 ENCOUNTER — Encounter: Payer: Self-pay | Admitting: Unknown Physician Specialty

## 2017-06-19 ENCOUNTER — Ambulatory Visit: Payer: Medicare HMO | Admitting: Unknown Physician Specialty

## 2017-06-19 DIAGNOSIS — I6389 Other cerebral infarction: Secondary | ICD-10-CM | POA: Diagnosis not present

## 2017-06-19 DIAGNOSIS — F419 Anxiety disorder, unspecified: Secondary | ICD-10-CM | POA: Diagnosis not present

## 2017-06-19 DIAGNOSIS — F329 Major depressive disorder, single episode, unspecified: Secondary | ICD-10-CM | POA: Diagnosis not present

## 2017-06-19 DIAGNOSIS — I10 Essential (primary) hypertension: Secondary | ICD-10-CM | POA: Diagnosis not present

## 2017-06-19 DIAGNOSIS — F32A Depression, unspecified: Secondary | ICD-10-CM

## 2017-06-19 MED ORDER — HYDROCHLOROTHIAZIDE 25 MG PO TABS: 25.0000 mg | ORAL_TABLET | Freq: Every day | ORAL | 3 refills | Status: DC

## 2017-06-19 NOTE — Assessment & Plan Note (Signed)
Not to goal.  Will add HCTZ to Lisinopril

## 2017-06-19 NOTE — Assessment & Plan Note (Signed)
Filled out disability due to left sided weakness and aphasia

## 2017-06-19 NOTE — Progress Notes (Signed)
BP (!) 150/109 (BP Location: Left Arm, Cuff Size: Normal)   Pulse 69   Temp 98.2 F (36.8 C) (Oral)   Ht 5' 6.3" (1.684 m)   Wt 158 lb 3.2 oz (71.8 kg)   SpO2 95%   BMI 25.30 kg/m    Subjective:    Patient ID: Mark [name], male    DOB: 05-26-1961, 56 y.o.   MRN: [id number]  HPI: Mark [name] is a 56 y.o. male  Chief Complaint  Patient presents with  . Paperwork   Hypertension Using medications without difficulty Average home BPs "always a little high"   No problems or lightheadedness No chest pain with exertion or shortness of breath No Edema  Depression States he is "OK"  States he is not using any street drugs.   Depression screen [facility] 2/9 05/30/2017 11/22/2016 07/26/2015  Decreased Interest 0 2 0  Down, Depressed, Hopeless 0 2 0  PHQ - 2 Score 0 4 0  Altered sleeping - 2 -  Tired, decreased energy - 3 -  Change in appetite - 2 -  Feeling bad or failure about yourself  - 2 -  Trouble concentrating - 3 -  Moving slowly or fidgety/restless - 2 -  Suicidal thoughts - 0 -  PHQ-9 Score - 18 -   Disability Needs paperwork filled out   Relevant past medical, surgical, family and social history reviewed and updated as indicated. Interim medical history since our last visit reviewed. Allergies and medications reviewed and updated.  Review of Systems  Per HPI unless specifically indicated above     Objective:    BP (!) 150/109 (BP Location: Left Arm, Cuff Size: Normal)   Pulse 69   Temp 98.2 F (36.8 C) (Oral)   Ht 5' 6.3" (1.684 m)   Wt 158 lb 3.2 oz (71.8 kg)   SpO2 95%   BMI 25.30 kg/m   Wt Readings from Last 3 Encounters:  06/19/17 158 lb 3.2 oz (71.8 kg)  05/30/17 160 lb 12.8 oz (72.9 kg)  12/15/16 161 lb (73 kg)    Physical Exam  Constitutional: He is oriented to person, place, and time. He appears well-developed and well-nourished. No distress.  HENT:  Head: Normocephalic and atraumatic.  Eyes: Conjunctivae and lids are normal.  Right eye exhibits no discharge. Left eye exhibits no discharge. No scleral icterus.  Neck: Normal range of motion. Neck supple. No JVD present. Carotid bruit is not present.  Cardiovascular: Normal rate, regular rhythm and normal heart sounds.  Pulmonary/Chest: Effort normal and breath sounds normal. No respiratory distress.  Abdominal: Normal appearance. There is no splenomegaly or hepatomegaly.  Neurological: He is alert and oriented to person, place, and time.  Skin: Skin is warm, dry and intact. No rash noted. No pallor.  Psychiatric: He has a normal mood and affect. His behavior is normal. Judgment and thought content normal.    Results for orders placed or performed in visit on 11/22/16  Comprehensive metabolic panel  Result Value Ref Range   Glucose 148 (H) 65 - 99 mg/dL   BUN 14 6 - 24 mg/dL   Creatinine, Ser 7.82 0.76 - 1.27 mg/dL   GFR calc non Af Amer 69 >59 mL/min/1.73   GFR calc Af Amer 79 >59 mL/min/1.73   BUN/Creatinine Ratio 12 9 - 20   Sodium 141 134 - 144 mmol/L   Potassium 4.4 3.5 - 5.2 mmol/L   Chloride 100 96 - 106 mmol/L   CO2 23  18 - 29 mmol/L   Calcium 9.8 8.7 - 10.2 mg/dL   Total Protein 7.1 6.0 - 8.5 g/dL   Albumin 4.6 3.5 - 5.5 g/dL   Globulin, Total 2.5 1.5 - 4.5 g/dL   Albumin/Globulin Ratio 1.8 1.2 - 2.2   Bilirubin Total 0.2 0.0 - 1.2 mg/dL   Alkaline Phosphatase 90 39 - 117 IU/L   AST 27 0 - 40 IU/L   ALT 49 (H) 0 - 44 IU/L  Lipid Panel w/o Chol/HDL Ratio  Result Value Ref Range   Cholesterol, Total 238 (H) 100 - 199 mg/dL   Triglycerides 528378 (H) 0 - 149 mg/dL   HDL 38 (L) >41>39 mg/dL   VLDL Cholesterol Cal 76 (H) 5 - 40 mg/dL   LDL Calculated 324124 (H) 0 - 99 mg/dL      Assessment & Plan:   Problem List Items Addressed This Visit      Unprioritized   Anxiety and depression    Stable, continue present medications. Discussed no Lorazepam from this office       CVA (cerebral vascular accident) (HCC)    Filled out disability due to left  sided weakness and aphasia      Relevant Medications   hydrochlorothiazide (HYDRODIURIL) 25 MG tablet   Hypertension    Not to goal.  Will add HCTZ to Lisinopril      Relevant Medications   hydrochlorothiazide (HYDRODIURIL) 25 MG tablet       Follow up plan: Return in about 4 weeks (around 07/17/2017).

## 2017-06-19 NOTE — Assessment & Plan Note (Addendum)
Stable, continue present medications. Discussed no Lorazepam from this office

## 2017-07-03 ENCOUNTER — Other Ambulatory Visit: Payer: Self-pay

## 2017-07-03 ENCOUNTER — Emergency Department: Payer: Medicare HMO

## 2017-07-03 ENCOUNTER — Observation Stay
Admission: EM | Admit: 2017-07-03 | Discharge: 2017-07-04 | Disposition: A | Discharge status: Home / Self Care | Payer: Medicare HMO | Attending: Internal Medicine | Admitting: Internal Medicine

## 2017-07-03 DIAGNOSIS — Z7982 Long term (current) use of aspirin: Secondary | ICD-10-CM | POA: Diagnosis not present

## 2017-07-03 DIAGNOSIS — Z8673 Personal history of transient ischemic attack (TIA), and cerebral infarction without residual deficits: Secondary | ICD-10-CM | POA: Insufficient documentation

## 2017-07-03 DIAGNOSIS — W19XXXA Unspecified fall, initial encounter: Secondary | ICD-10-CM | POA: Diagnosis not present

## 2017-07-03 DIAGNOSIS — S0990XA Unspecified injury of head, initial encounter: Secondary | ICD-10-CM | POA: Diagnosis not present

## 2017-07-03 DIAGNOSIS — R7989 Other specified abnormal findings of blood chemistry: Secondary | ICD-10-CM | POA: Insufficient documentation

## 2017-07-03 DIAGNOSIS — J322 Chronic ethmoidal sinusitis: Secondary | ICD-10-CM | POA: Diagnosis not present

## 2017-07-03 DIAGNOSIS — E860 Dehydration: Secondary | ICD-10-CM | POA: Diagnosis not present

## 2017-07-03 DIAGNOSIS — I672 Cerebral atherosclerosis: Secondary | ICD-10-CM | POA: Diagnosis not present

## 2017-07-03 DIAGNOSIS — F17210 Nicotine dependence, cigarettes, uncomplicated: Secondary | ICD-10-CM | POA: Diagnosis not present

## 2017-07-03 DIAGNOSIS — G9389 Other specified disorders of brain: Secondary | ICD-10-CM | POA: Diagnosis not present

## 2017-07-03 DIAGNOSIS — Z885 Allergy status to narcotic agent status: Secondary | ICD-10-CM | POA: Insufficient documentation

## 2017-07-03 DIAGNOSIS — R270 Ataxia, unspecified: Secondary | ICD-10-CM

## 2017-07-03 DIAGNOSIS — F419 Anxiety disorder, unspecified: Secondary | ICD-10-CM | POA: Insufficient documentation

## 2017-07-03 DIAGNOSIS — N179 Acute kidney failure, unspecified: Secondary | ICD-10-CM | POA: Diagnosis not present

## 2017-07-03 DIAGNOSIS — R296 Repeated falls: Secondary | ICD-10-CM | POA: Diagnosis not present

## 2017-07-03 DIAGNOSIS — I10 Essential (primary) hypertension: Secondary | ICD-10-CM | POA: Diagnosis not present

## 2017-07-03 DIAGNOSIS — F329 Major depressive disorder, single episode, unspecified: Secondary | ICD-10-CM | POA: Diagnosis not present

## 2017-07-03 DIAGNOSIS — Z79899 Other long term (current) drug therapy: Secondary | ICD-10-CM | POA: Diagnosis not present

## 2017-07-03 DIAGNOSIS — S0101XA Laceration without foreign body of scalp, initial encounter: Secondary | ICD-10-CM | POA: Insufficient documentation

## 2017-07-03 DIAGNOSIS — F32A Depression, unspecified: Secondary | ICD-10-CM | POA: Diagnosis present

## 2017-07-03 DIAGNOSIS — Z8249 Family history of ischemic heart disease and other diseases of the circulatory system: Secondary | ICD-10-CM | POA: Insufficient documentation

## 2017-07-03 DIAGNOSIS — R42 Dizziness and giddiness: Secondary | ICD-10-CM | POA: Diagnosis not present

## 2017-07-03 LAB — BASIC METABOLIC PANEL
Anion gap: 10 (ref 5–15)
BUN: 26 mg/dL — ABNORMAL HIGH (ref 6–20)
CHLORIDE: 100 mmol/L — AB (ref 101–111)
CO2: 23 mmol/L (ref 22–32)
Calcium: 8.4 mg/dL — ABNORMAL LOW (ref 8.9–10.3)
Creatinine, Ser: 2.18 mg/dL — ABNORMAL HIGH (ref 0.61–1.24)
GFR calc non Af Amer: 32 mL/min — ABNORMAL LOW (ref >60)
GFR, EST AFRICAN AMERICAN: 37 mL/min — AB (ref >60)
Glucose, Bld: 95 mg/dL (ref 65–99)
POTASSIUM: 4.3 mmol/L (ref 3.5–5.1)
Sodium: 133 mmol/L — ABNORMAL LOW (ref 135–145)

## 2017-07-03 LAB — CBC WITH DIFFERENTIAL/PLATELET
BASOS ABS: 0.1 10*3/uL (ref 0–0.1)
Basophils Relative: 1 %
Eosinophils Absolute: 0.3 10*3/uL (ref 0–0.7)
Eosinophils Relative: 3 %
HEMATOCRIT: 47 % (ref 40.0–52.0)
Hemoglobin: 15.6 g/dL (ref 13.0–18.0)
LYMPHS ABS: 3.8 10*3/uL — AB (ref 1.0–3.6)
LYMPHS PCT: 31 %
MCH: 32.3 pg (ref 26.0–34.0)
MCHC: 33.3 g/dL (ref 32.0–36.0)
MCV: 97.2 fL (ref 80.0–100.0)
MONO ABS: 1.1 10*3/uL — AB (ref 0.2–1.0)
MONOS PCT: 9 %
NEUTROS ABS: 6.9 10*3/uL — AB (ref 1.4–6.5)
Neutrophils Relative %: 56 %
Platelets: 354 10*3/uL (ref 150–440)
RBC: 4.83 MIL/uL (ref 4.40–5.90)
RDW: 13.5 % (ref 11.5–14.5)
WBC: 12.3 10*3/uL — ABNORMAL HIGH (ref 3.8–10.6)

## 2017-07-03 LAB — COMPREHENSIVE METABOLIC PANEL
ALT: 36 U/L (ref 17–63)
AST: 27 U/L (ref 15–41)
Albumin: 4.3 g/dL (ref 3.5–5.0)
Alkaline Phosphatase: 102 U/L (ref 38–126)
Anion gap: 13 (ref 5–15)
BILIRUBIN TOTAL: 0.6 mg/dL (ref 0.3–1.2)
BUN: 26 mg/dL — AB (ref 6–20)
CO2: 28 mmol/L (ref 22–32)
CREATININE: 2.56 mg/dL — AB (ref 0.61–1.24)
Calcium: 9.4 mg/dL (ref 8.9–10.3)
Chloride: 93 mmol/L — ABNORMAL LOW (ref 101–111)
GFR calc Af Amer: 30 mL/min — ABNORMAL LOW (ref >60)
GFR, EST NON AFRICAN AMERICAN: 26 mL/min — AB (ref >60)
Glucose, Bld: 91 mg/dL (ref 65–99)
POTASSIUM: 4.2 mmol/L (ref 3.5–5.1)
Sodium: 134 mmol/L — ABNORMAL LOW (ref 135–145)
TOTAL PROTEIN: 7.3 g/dL (ref 6.5–8.1)

## 2017-07-03 LAB — ETHANOL: Alcohol, Ethyl (B): <10 mg/dL (ref <10)

## 2017-07-03 LAB — TROPONIN I

## 2017-07-03 MED ORDER — SODIUM CHLORIDE 0.9 % IV SOLN
Freq: Once | INTRAVENOUS | Status: AC
  Administered 2017-07-03: 20:00:00 via INTRAVENOUS

## 2017-07-03 MED ORDER — SODIUM CHLORIDE 0.9 % IV SOLN
Freq: Once | INTRAVENOUS | Status: AC
  Administered 2017-07-03: 21:00:00 via INTRAVENOUS

## 2017-07-03 NOTE — ED Notes (Signed)
This EDT spoke with Lab Garine whom stated that it was acceptable for me to send the blood specimen down with a white label and that she would apply an appropriate label on it so that the blood would be processed

## 2017-07-03 NOTE — ED Provider Notes (Signed)
[facility] Emergency Department Provider Note       Time seen: ----------------------------------------- 8:30 PM on 07/03/2017 -----------------------------------------   I have reviewed the triage vital signs and the nursing notes.  HISTORY   Chief Complaint Fall and Near Syncope    HPI Mark [name] is a 56 y.o. male with a history of hypertension and CVA who presents to the ED for dizziness and falls today.  Patient states during 1 of the falls he struck the back of his head and sustained a laceration on his scalp.  Patient states he also took 3 oxycodone tablets which he believes are oxycodone 10 mg.  They are a friend's and are not he is prescription but he typically takes them daily.  Patient reports recently he was placed on HCTZ for his blood pressure.  Past Medical History:  Diagnosis Date  . Hypertension   . Stroke [facility](HCC)     Patient Active Problem List   Diagnosis Date Noted  . Cognitive impairment 12/15/2016  . Elevated transaminase level 11/22/2016  . Opiate abuse, continuous (HCC) 12/30/2015  . Opiate overdose (HCC) 12/30/2015  . Tylenol toxicity 12/29/2015  . Hematuria 05/03/2015  . Hemiplegia (HCC) 05/03/2015  . Intestinal obstruction (HCC) 05/03/2015  . 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 Anxiety and depression 05/03/2015  . Hypertension 05/03/2015  . Hyperkalemia 05/03/2015  . 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 CVA (cerebral vascular accident) (HCC) 05/03/2015    Past Surgical History:  Procedure Laterality Date  . APPENDECTOMY    . bowel obstruction      Allergies Codeine  Social History Social History   Tobacco Use  . Smoking status: Current Every Day Smoker    Packs/day: 1.00    Types: Cigarettes  . Smokeless tobacco: Never Used  Substance Use Topics  . Alcohol use: No    Alcohol/week: 0.0 oz  . Drug use: No    Review of Systems Constitutional: Negative for fever. Eyes:  Negative for vision changes ENT:  Negative for congestion, sore throat Cardiovascular: Negative for chest pain. Respiratory: Negative for shortness of breath. Gastrointestinal: Negative for abdominal pain, vomiting and diarrhea. Musculoskeletal: Negative for back pain. Skin: Negative for rash. Neurological: Positive for headache, dizziness  All systems negative/normal/unremarkable except as stated in the HPI  ____________________________________________   PHYSICAL EXAM:  VITAL SIGNS: ED Triage Vitals  Enc Vitals Group     BP 07/03/17 1955 (!) 89/70     Pulse Rate 07/03/17 1955 70     Resp 07/03/17 1955 18     Temp 07/03/17 1955 97.6 F (36.4 C)     Temp Source 07/03/17 1955 Oral     SpO2 07/03/17 1955 94 %     Weight 07/03/17 1956 158 lb (71.7 kg)     Height 07/03/17 1956 5\' 6"  (1.676 m)     Head Circumference --      Peak Flow --      Pain Score 07/03/17 1955 5     Pain Loc --      Pain Edu? --      Excl. in GC? --     Constitutional: Alert and oriented. Well appearing and in no distress. Eyes: Conjunctivae are normal. Normal extraocular movements. ENT   Head: Normocephalic and atraumatic.   Nose: No congestion/rhinnorhea.   Mouth/Throat: Mucous membranes are moist.   Neck: No stridor. Cardiovascular: Normal rate, regular rhythm. No murmurs, rubs, or gallops. Respiratory: Normal respiratory effort without tachypnea nor retractions. Breath  sounds are clear and equal bilaterally. No wheezes/rales/rhonchi. Gastrointestinal: Soft and nontender. Normal bowel sounds Musculoskeletal: Nontender with normal range of motion in extremities. No lower extremity tenderness nor edema. Neurologic: Dysarthria, no gross focal neurologic deficits are appreciated.  Skin:  Skin is warm, dry and intact. No rash noted. Psychiatric: Mood and affect are normal. Speech and behavior are normal.  ____________________________________________  EKG: Interpreted by me.  Sinus rhythm  rate of 67 bpm, normal PR interval, normal QRS, normal QT.  ____________________________________________  ED COURSE:  Pertinent labs & imaging results that were available during my care of the patient were reviewed by me and considered in my medical decision making (see chart for details). Patient presents for dizziness and falling, we will assess with labs and imaging as indicated.   Marland Kitchen..Laceration Repair Date/Time: 07/03/2017 10:11 PM Performed by: Emily FilbertWilliams, Jonathan E, MD Authorized by: Emily FilbertWilliams, Jonathan E, MD   Consent:    Consent obtained:  Verbal   Consent given by:  Patient Anesthesia (see MAR for exact dosages):    Anesthesia method:  None Laceration details:    Location:  Scalp   Scalp location:  Crown   Length (cm):  3   Depth (mm):  5 Repair type:    Repair type:  Simple Pre-procedure details:    Preparation:  Patient was prepped and draped in usual sterile fashion Exploration:    Contaminated: no   Treatment:    Area cleansed with:  Saline   Irrigation solution:  Sterile saline   Visualized foreign bodies/material removed: no   Skin repair:    Repair method:  Staples   Number of staples:  3 Approximation:    Approximation:  Close   Vermilion border: well-aligned   Post-procedure details:    Dressing:  Open (no dressing)   Patient tolerance of procedure:  Tolerated well, no immediate complications   ____________________________________________   LABS (pertinent positives/negatives)  Labs Reviewed  CBC WITH DIFFERENTIAL/PLATELET - Abnormal; Notable for the following components:      Result Value   WBC 12.3 (*)    Neutro Abs 6.9 (*)    Lymphs Abs 3.8 (*)    Monocytes Absolute 1.1 (*)    All other components within normal limits  COMPREHENSIVE METABOLIC PANEL  TROPONIN I  URINALYSIS, COMPLETE (UACMP) WITH MICROSCOPIC  ETHANOL  URINE DRUG SCREEN, QUALITATIVE ([facility] ONLY)    RADIOLOGY  CT head IMPRESSION: 1. No acute intracranial findings.  Bilateral chronic watershed infarcts and several small chronic lacunar infarcts as noted above. 2. Mild scalp soft tissue swelling adjacent to the left posterior vertex skin staples. 3. Mild chronic ethmoid sinusitis. 4. Atherosclerosis. ____________________________________________  DIFFERENTIAL DIAGNOSIS   Dehydration, anemia, closed head injury, skull fracture, subdural hematoma, medication side effect, hypotension, occult infection  FINAL ASSESSMENT AND PLAN  Dizziness, head injury, scalp laceration   Plan: Patient had presented for dizziness. Patient's labs did indicate acute kidney injury with an initial creatinine of 2.56.  After 2 L of fluid he did improve to 2.18 but he still cannot walk without falling and is remains very ataxic. Patient's imaging is negative for any acute process but he will need MRI PT evaluation.  He could certainly have had a posterior stroke.  I will ensure he is taking anticoagulants and discussed with the hospitalist for admission.   Emily FilbertWilliams, Jonathan E, MD   Note: This note was generated in part or whole with voice recognition software. Voice recognition is usually quite accurate but there are transcription  errors that can and very often do occur. I apologize for any typographical errors that were not detected and corrected.     Emily Filbert, MD 07/03/17 [phone]

## 2017-07-03 NOTE — ED Notes (Signed)
Patient transported to CT 

## 2017-07-03 NOTE — ED Triage Notes (Addendum)
Pt presents to ED with dizziness and falls X3 today. Laceration to the back of his head. Bleeding controlled. Pt states he took 3 oxycodone tablets for his back pain that had been prescribed to his friend around lunch time in addition to his blood pressure medications. Pt states his PCP changed his BP meds about a week ago. Hx CVA.

## 2017-07-04 ENCOUNTER — Emergency Department: Payer: Medicare HMO

## 2017-07-04 DIAGNOSIS — F419 Anxiety disorder, unspecified: Secondary | ICD-10-CM | POA: Diagnosis not present

## 2017-07-04 DIAGNOSIS — R270 Ataxia, unspecified: Secondary | ICD-10-CM

## 2017-07-04 DIAGNOSIS — I10 Essential (primary) hypertension: Secondary | ICD-10-CM | POA: Diagnosis not present

## 2017-07-04 DIAGNOSIS — N179 Acute kidney failure, unspecified: Secondary | ICD-10-CM | POA: Diagnosis not present

## 2017-07-04 DIAGNOSIS — R296 Repeated falls: Secondary | ICD-10-CM | POA: Diagnosis not present

## 2017-07-04 LAB — URINALYSIS, COMPLETE (UACMP) WITH MICROSCOPIC
BILIRUBIN URINE: NEGATIVE
Bacteria, UA: NONE SEEN
GLUCOSE, UA: NEGATIVE mg/dL
HGB URINE DIPSTICK: NEGATIVE
Ketones, ur: NEGATIVE mg/dL
Leukocytes, UA: NEGATIVE
Nitrite: NEGATIVE
PH: 5 (ref 5.0–8.0)
Protein, ur: NEGATIVE mg/dL
SPECIFIC GRAVITY, URINE: 1.006 (ref 1.005–1.030)

## 2017-07-04 LAB — CBC
HCT: 41.3 % (ref 40.0–52.0)
Hemoglobin: 13.9 g/dL (ref 13.0–18.0)
MCH: 32.8 pg (ref 26.0–34.0)
MCHC: 33.7 g/dL (ref 32.0–36.0)
MCV: 97.4 fL (ref 80.0–100.0)
PLATELETS: 272 10*3/uL (ref 150–440)
RBC: 4.25 MIL/uL — AB (ref 4.40–5.90)
RDW: 13 % (ref 11.5–14.5)
WBC: 7.2 10*3/uL (ref 3.8–10.6)

## 2017-07-04 LAB — BASIC METABOLIC PANEL
ANION GAP: 6 (ref 5–15)
BUN: 21 mg/dL — ABNORMAL HIGH (ref 6–20)
CO2: 26 mmol/L (ref 22–32)
Calcium: 8.2 mg/dL — ABNORMAL LOW (ref 8.9–10.3)
Chloride: 105 mmol/L (ref 101–111)
Creatinine, Ser: 1.54 mg/dL — ABNORMAL HIGH (ref 0.61–1.24)
GFR calc Af Amer: 56 mL/min — ABNORMAL LOW (ref >60)
GFR, EST NON AFRICAN AMERICAN: 48 mL/min — AB (ref >60)
GLUCOSE: 94 mg/dL (ref 65–99)
POTASSIUM: 3.9 mmol/L (ref 3.5–5.1)
Sodium: 137 mmol/L (ref 135–145)

## 2017-07-04 LAB — URINE DRUG SCREEN, QUALITATIVE (ARMC ONLY)
Amphetamines, Ur Screen: NOT DETECTED
BARBITURATES, UR SCREEN: NOT DETECTED
Benzodiazepine, Ur Scrn: NOT DETECTED
CANNABINOID 50 NG, UR ~~LOC~~: NOT DETECTED
COCAINE METABOLITE, UR ~~LOC~~: NOT DETECTED
MDMA (Ecstasy)Ur Screen: NOT DETECTED
Methadone Scn, Ur: NOT DETECTED
OPIATE, UR SCREEN: NOT DETECTED
Phencyclidine (PCP) Ur S: NOT DETECTED
Tricyclic, Ur Screen: NOT DETECTED

## 2017-07-04 MED ORDER — ACETAMINOPHEN 650 MG RE SUPP: 650.0000 mg | Freq: Four times a day (QID) | RECTAL | Status: DC | PRN

## 2017-07-04 MED ORDER — HEPARIN SODIUM (PORCINE) 5000 UNIT/ML IJ SOLN: 5000.0000 [IU] | Freq: Three times a day (TID) | INTRAMUSCULAR | Status: DC

## 2017-07-04 MED ORDER — ONDANSETRON HCL 4 MG PO TABS: 4.0000 mg | ORAL_TABLET | Freq: Four times a day (QID) | ORAL | Status: DC | PRN

## 2017-07-04 MED ORDER — ACETAMINOPHEN 325 MG PO TABS: 650.0000 mg | ORAL_TABLET | Freq: Four times a day (QID) | ORAL | Status: DC | PRN

## 2017-07-04 MED ORDER — INFLUENZA VAC SPLIT QUAD 0.5 ML IM SUSY: 0.5000 mL | PREFILLED_SYRINGE | INTRAMUSCULAR | Status: DC

## 2017-07-04 MED ORDER — ASPIRIN 325 MG PO TABS
325.0000 mg | ORAL_TABLET | Freq: Every day | ORAL | Status: DC
  Administered 2017-07-04: 325 mg via ORAL
  Filled 2017-07-04: qty 1

## 2017-07-04 MED ORDER — METOPROLOL TARTRATE 50 MG PO TABS: 25.0000 mg | ORAL_TABLET | Freq: Two times a day (BID) | ORAL | 1 refills | Status: DC

## 2017-07-04 MED ORDER — ONDANSETRON HCL 4 MG/2ML IJ SOLN: 4.0000 mg | Freq: Four times a day (QID) | INTRAMUSCULAR | Status: DC | PRN

## 2017-07-04 MED ORDER — SODIUM CHLORIDE 0.9 % IV SOLN
INTRAVENOUS | Status: DC
  Administered 2017-07-04: 04:00:00 via INTRAVENOUS

## 2017-07-04 MED ORDER — CITALOPRAM HYDROBROMIDE 20 MG PO TABS
40.0000 mg | ORAL_TABLET | Freq: Every day | ORAL | Status: DC
  Administered 2017-07-04: 40 mg via ORAL
  Filled 2017-07-04: qty 2

## 2017-07-04 MED ORDER — METOPROLOL TARTRATE 25 MG PO TABS: 12.5000 mg | ORAL_TABLET | Freq: Two times a day (BID) | ORAL | Status: DC

## 2017-07-04 MED ORDER — METOPROLOL TARTRATE 50 MG PO TABS: 50.0000 mg | ORAL_TABLET | Freq: Two times a day (BID) | ORAL | Status: DC

## 2017-07-04 NOTE — Discharge Summary (Signed)
[facility] [place] [facility]   PATIENT NAME: Mark [name]    MR#:  [id number]  DATE OF BIRTH:  24-Feb-1961  DATE OF ADMISSION:  07/03/2017 ADMITTING PHYSICIAN: Oralia Manisavid Willis, MD  DATE OF DISCHARGE: 07/04/2017   PRIMARY CARE PHYSICIAN: Gabriel CirriWicker, Cheryl, NP    ADMISSION DIAGNOSIS:  Ataxia [R27.0] Acute kidney injury (HCC) [N17.9] Injury of head, initial encounter [S09.90XA] Fall, initial encounter [W19.XXXA]  DISCHARGE DIAGNOSIS:  Acute renal failure secondary to prerenal azotemia from dehydration improved Relative hypotension improving  SECONDARY DIAGNOSIS:   Past Medical History:  Diagnosis Date  . Hypertension   . Stroke [facility](HCC)     HOSPITAL COURSE:  Mark [name]  is a 56 y.o. male who presents with both falls at home.  Patient states that he had several episodes of lightheadedness followed by falls.  He has a history of prior stroke.  Here in the ED he was found to have AKI initially strong concern for possible stroke.  MRI of his head showed no acute stroke   1 AKI (acute kidney injury) (HCC) -patient was on HCTZ for blood pressure control, and likely became dehydrated in part due to this medication.  His falls were almost certainly due to his dehydration, and the patient was likely orthostatic.   -Received aggressive IV fluids  - avoid nephrotoxins and monitor for expected improvement -Came in with creatinine of 2.56--- 1.5 -Feels a whole lot better.  Eating breakfast.  2.  Ataxia -improved now after some IV fluids.  MRI showed no acute stroke. -We will ambulate with RN this morning  3.  Hypertension  - hold HCTZ and lisinopril  -Decrease his dose of metoprolol to 25 twice daily.  Holding parameters given.  4.  Anxiety and depression -continue home meds    CONSULTS OBTAINED:    DRUG ALLERGIES:   Allergies  Allergen Reactions  . Codeine Hives    DISCHARGE MEDICATIONS:   Current Discharge Medication List    CONTINUE  these medications which have CHANGED   Details  metoprolol tartrate (LOPRESSOR) 50 MG tablet Take 0.5 tablets (25 mg total) by mouth 2 (two) times daily. HOLD if your systolic BP is <120 Qty: 60 tablet, Refills: 1      CONTINUE these medications which have NOT CHANGED   Details  aspirin 325 MG tablet Take 325 mg by mouth daily.    citalopram (CELEXA) 40 MG tablet Take 1 tablet (40 mg total) by mouth daily. Qty: 30 tablet, Refills: 5      STOP taking these medications     hydrochlorothiazide (HYDRODIURIL) 25 MG tablet      lisinopril (PRINIVIL,ZESTRIL) 20 MG tablet         If you experience worsening of your admission symptoms, develop shortness of breath, life threatening emergency, suicidal or homicidal thoughts you must seek medical attention immediately by calling 911 or calling your MD immediately  if symptoms less severe.  You Must read complete instructions/literature along with all the possible adverse reactions/side effects for all the Medicines you take and that have been prescribed to you. Take any new Medicines after you have completely understood and accept all the possible adverse reactions/side effects.   Please note  You were cared for by a hospitalist during your hospital stay. If you have any questions about your discharge medications or the care you received while you were in the hospital after you are discharged, you can call the unit and asked to speak with the hospitalist  on call if the hospitalist that took care of you is not available. Once you are discharged, your primary care physician will handle any further medical issues. Please note that NO REFILLS for any discharge medications will be authorized once you are discharged, as it is imperative that you return to your primary care physician (or establish a relationship with a primary care physician if you do not have one) for your aftercare needs so that they can reassess your need for medications and monitor  your lab values. Today   SUBJECTIVE   Feels a whole lot better.  Eating breakfast.  Requesting to go home.  VITAL SIGNS:  Blood pressure 113/75, pulse 78, temperature 98.5 F (36.9 C), temperature source Oral, resp. rate 16, height 5\' 6"  (1.676 m), weight 70.5 kg (155 lb 6.4 oz), SpO2 99 %.  I/O:    Intake/Output Summary (Last 24 hours) at 07/04/2017 0924 Last data filed at 07/04/2017 0981 Gross per 24 hour  Intake 2107.5 ml  Output 975 ml  Net 1132.5 ml    PHYSICAL EXAMINATION:  GENERAL:  56 y.o.-year-old patient lying in the bed with no acute distress.  EYES: Pupils equal, round, reactive to light and accommodation. No scleral icterus. Extraocular muscles intact.  HEENT: Head atraumatic, normocephalic. Oropharynx and nasopharynx clear.  NECK:  Supple, no jugular venous distention. No thyroid enlargement, no tenderness.  LUNGS: Normal breath sounds bilaterally, no wheezing, rales,rhonchi or crepitation. No use of accessory muscles of respiration.  CARDIOVASCULAR: S1, S2 normal. No murmurs, rubs, or gallops.  ABDOMEN: Soft, non-tender, non-distended. Bowel sounds present. No organomegaly or mass.  EXTREMITIES: No pedal edema, cyanosis, or clubbing.  NEUROLOGIC: Cranial nerves II through XII are intact. Muscle strength 5/5 in all extremities. Sensation intact. Gait not checked.  PSYCHIATRIC: The patient is alert and oriented x 3.  SKIN: No obvious rash, lesion, or ulcer.   DATA REVIEW:   CBC  Recent Labs  Lab 07/04/17 0555  WBC 7.2  HGB 13.9  HCT 41.3  PLT 272    Chemistries  Recent Labs  Lab 07/03/17 1957  07/04/17 0555  NA 134*   < > 137  K 4.2   < > 3.9  CL 93*   < > 105  CO2 28   < > 26  GLUCOSE 91   < > 94  BUN 26*   < > 21*  CREATININE 2.56*   < > 1.54*  CALCIUM 9.4   < > 8.2*  AST 27  --   --   ALT 36  --   --   ALKPHOS 102  --   --   BILITOT 0.6  --   --    < > = values in this interval not displayed.    Microbiology Results   No results  found for this or any previous visit (from the past 240 hour(s)).  RADIOLOGY:  Ct Head Wo Contrast  Result Date: 07/03/2017 CLINICAL DATA:  Dizziness and falls today. Laceration to the back of the head. EXAM: CT HEAD WITHOUT CONTRAST TECHNIQUE: Contiguous axial images were obtained from the base of the skull through the vertex without intravenous contrast. COMPARISON:  Multiple exams, CT head from 09/21/2011 FINDINGS: Brain: Small remote lacunar infarcts in the cerebellum, including medially in the left cerebellar hemisphere (image 9/2) and centrally in the right cerebellar hemisphere (image 8/2). These appear chronic. Prior watershed infarcts noted in all 4 quadrants with evidence of chronic ischemic microvascular white matter disease. Suspected small remote  lacunar infarct of the head of the right caudate nucleus. Basal ganglia otherwise unremarkable. Some of the extension in the left posterior watershed region up to the left cerebral vertex this more conspicuous than on 09/21/2011, likely due to interval evolutionary findings, but appears overall chronic. No compelling findings of acute CVA. No intracranial hemorrhage or mass lesion identified. Vascular: Left vertebral artery atherosclerotic calcification. There is atherosclerotic calcification of the cavernous carotid arteries bilaterally. Skull: Unremarkable Sinuses/Orbits: Mucosal thickening in the ethmoid air cells. Other: Mild scalp soft tissue swelling along the left posterior vertex skin staples IMPRESSION: 1. No acute intracranial findings. Bilateral chronic watershed infarcts and several small chronic lacunar infarcts as noted above. 2. Mild scalp soft tissue swelling adjacent to the left posterior vertex skin staples. 3. Mild chronic ethmoid sinusitis. 4. Atherosclerosis. Electronically Signed   By: Gaylyn RongWalter  Liebkemann M.D.   On: 07/03/2017 20:24   Mr Brain Wo Contrast  Result Date: 07/04/2017 CLINICAL DATA:  Ataxia.  Multiple falls EXAM: MRI  HEAD WITHOUT CONTRAST TECHNIQUE: Multiplanar, multiecho pulse sequences of the brain and surrounding structures were obtained without intravenous contrast. COMPARISON:  Head CT 07/03/2017 FINDINGS: Brain: The midline structures are normal. There is no acute infarct or acute hemorrhage. No mass lesion, hydrocephalus, dural abnormality or extra-axial collection. Multiple bilateral small cerebellar chronic and starts. Old right occipital, left parietal bilateral frontal infarcts There is diffuse confluent hyperintense T2-weighted signal within the Set to white matter, most often seen in the setting of chronic microvascular ischemia. No age-advanced or lobar predominant atrophy. No chronic microhemorrhage or superficial siderosis. Vascular: Major intracranial arterial and venous sinus flow voids are preserved. Skull and upper cervical spine: The visualized skull base, calvarium, upper cervical spine and extracranial soft tissues are normal. Sinuses/Orbits: No fluid levels or advanced mucosal thickening. No mastoid or middle ear effusion. Normal orbits. IMPRESSION: 1. No acute abnormality. 2. Multifocal encephalomalacia at the site of old infarcts, as well as small chronic, small bilateral cerebellar infarcts. 3. Confluent white matter hyperintensity consistent with advanced chronic ischemic microangiopathy. Electronically Signed   By: Deatra RobinsonKevin  Herman M.D.   On: 07/04/2017 01:29     Management plans discussed with the patient, family and they are in agreement.  CODE STATUS:     Code Status Orders  (From admission, onward)        Start     Ordered   07/04/17 0449  Full code  Continuous     07/04/17 0448    Code Status History    Date Active Date Inactive Code Status Order ID Comments User Context   12/29/2015 19:07 12/31/2015 19:37 Full Code [id number]  Enedina FinnerPatel, Les Longmore, MD Inpatient      TOTAL TIME TAKING CARE OF THIS PATIENT: *40* minutes.    Enedina FinnerSona Sheryn Aldaz M.D on 07/04/2017 at 9:24 AM  Between 7am to 6pm  - Pager - [phone] After 6pm go to [email or web address] - Social research officer, [email or web address] [email or web address]  [facility]  Office  [phone]  CC: Primary care physician; Gabriel CirriWicker, Cheryl, NP

## 2017-07-04 NOTE — H&P (Signed)
[facility] [place] at [facility]   PATIENT NAME: Mark [name] Granja    MR#:  [id number]  DATE OF BIRTH:  December 11, 1960  DATE OF ADMISSION:  07/03/2017  PRIMARY CARE PHYSICIAN: Gabriel CirriWicker, Cheryl, NP   REQUESTING/REFERRING PHYSICIAN: Mayford KnifeWilliams, MD  CHIEF COMPLAINT:   Chief Complaint  Patient presents with  . Fall  . Near Syncope    HISTORY OF PRESENT ILLNESS:  Mark [name] Flanagin  is a 56 y.o. male who presents with both falls at home.  Patient states that he had several episodes of lightheadedness followed by falls.  He has a history of prior stroke.  Here in the ED he was found to have AKI initially strong concern for possible stroke.  MRI of his head showed no acute stroke.  Hospitalist were called for admission for treatment for his AKI.  PAST MEDICAL HISTORY:   Past Medical History:  Diagnosis Date  . Hypertension   . Stroke [facility](HCC)     PAST SURGICAL HISTORY:   Past Surgical History:  Procedure Laterality Date  . APPENDECTOMY    . bowel obstruction      SOCIAL HISTORY:   Social History   Tobacco Use  . Smoking status: Current Every Day Smoker    Packs/day: 1.00    Types: Cigarettes  . Smokeless tobacco: Never Used  Substance Use Topics  . Alcohol use: No    Alcohol/week: 0.0 oz    FAMILY HISTORY:   Family History  Problem Relation Age of Onset  . Heart disease Father   . Seizures Brother     DRUG ALLERGIES:   Allergies  Allergen Reactions  . Codeine Hives    MEDICATIONS AT HOME:   Prior to Admission medications   Medication Sig Start Date End Date Taking? Authorizing Provider  aspirin 325 MG tablet Take 325 mg by mouth daily.   Yes [provider]  citalopram (CELEXA) 40 MG tablet Take 1 tablet (40 mg total) by mouth daily. 11/22/16  Yes Gabriel CirriWicker, Cheryl, NP  hydrochlorothiazide (HYDRODIURIL) 25 MG tablet Take 1 tablet (25 mg total) daily by mouth. 06/19/17  Yes Gabriel CirriWicker, Cheryl, NP  lisinopril (PRINIVIL,ZESTRIL) 20 MG  tablet Take 1 tablet (20 mg total) by mouth daily. 11/22/16  Yes Gabriel CirriWicker, Cheryl, NP  metoprolol tartrate (LOPRESSOR) 50 MG tablet TAKE 1 TABLET (50 MG TOTAL) BY MOUTH 2 (TWO) TIMES DAILY. 05/15/17  Yes Gabriel CirriWicker, Cheryl, NP    REVIEW OF SYSTEMS:  Review of Systems  Constitutional: Negative for chills, fever, malaise/fatigue and weight loss.  HENT: Negative for ear pain, hearing loss and tinnitus.   Eyes: Negative for blurred vision, double vision, pain and redness.  Respiratory: Negative for cough, hemoptysis and shortness of breath.   Cardiovascular: Negative for chest pain, palpitations, orthopnea and leg swelling.       Lightheadedness  Gastrointestinal: Negative for abdominal pain, constipation, diarrhea, nausea and vomiting.  Genitourinary: Negative for dysuria, frequency and hematuria.  Musculoskeletal: Positive for falls. Negative for back pain, joint pain and neck pain.  Skin:       No acne, rash, or lesions  Neurological: Negative for dizziness, tremors, focal weakness and weakness.  Endo/Heme/Allergies: Negative for polydipsia. Does not bruise/bleed easily.  Psychiatric/Behavioral: Negative for depression. The patient is not nervous/anxious and does not have insomnia.      VITAL SIGNS:   Vitals:   07/03/17 2130 07/03/17 2230 07/03/17 2306 07/03/17 2310  BP: 97/84 107/87 (!) 123/94 (!) 139/95  Pulse: 65 62  Resp: 12 11    Temp:      TempSrc:      SpO2: 97% 100%    Weight:      Height:       Wt Readings from Last 3 Encounters:  07/03/17 71.7 kg (158 lb)  06/19/17 71.8 kg (158 lb 3.2 oz)  05/30/17 72.9 kg (160 lb 12.8 oz)    PHYSICAL EXAMINATION:  Physical Exam  Vitals reviewed. Constitutional: He is oriented to person, place, and time. He appears well-developed and well-nourished. No distress.  HENT:  Head: Normocephalic and atraumatic.  Mouth/Throat: Oropharynx is clear and moist.  Eyes: Conjunctivae and EOM are normal. Pupils are equal, round, and reactive to  light. No scleral icterus.  Neck: Normal range of motion. Neck supple. No JVD present. No thyromegaly present.  Cardiovascular: Normal rate, regular rhythm and intact distal pulses. Exam reveals no gallop and no friction rub.  No murmur heard. Respiratory: Effort normal and breath sounds normal. No respiratory distress. He has no wheezes. He has no rales.  GI: Soft. Bowel sounds are normal. He exhibits no distension. There is no tenderness.  Musculoskeletal: Normal range of motion. He exhibits no edema.  No arthritis, no gout  Lymphadenopathy:    He has no cervical adenopathy.  Neurological: He is alert and oriented to person, place, and time. No cranial nerve deficit.  No dysarthria, no aphasia  Skin: Skin is warm and dry. No rash noted. No erythema.  Psychiatric: He has a normal mood and affect. His behavior is normal. Judgment and thought content normal.    LABORATORY PANEL:   CBC Recent Labs  Lab 07/03/17 1957  WBC 12.3*  HGB 15.6  HCT 47.0  PLT 354   ------------------------------------------------------------------------------------------------------------------  Chemistries  Recent Labs  Lab 07/03/17 1957 07/03/17 2217  NA 134* 133*  K 4.2 4.3  CL 93* 100*  CO2 28 23  GLUCOSE 91 95  BUN 26* 26*  CREATININE 2.56* 2.18*  CALCIUM 9.4 8.4*  AST 27  --   ALT 36  --   ALKPHOS 102  --   BILITOT 0.6  --    ------------------------------------------------------------------------------------------------------------------  Cardiac Enzymes Recent Labs  Lab 07/03/17 1957  TROPONINI <0.03   ------------------------------------------------------------------------------------------------------------------  RADIOLOGY:  Ct Head Wo Contrast  Result Date: 07/03/2017 CLINICAL DATA:  Dizziness and falls today. Laceration to the back of the head. EXAM: CT HEAD WITHOUT CONTRAST TECHNIQUE: Contiguous axial images were obtained from the base of the skull through the vertex  without intravenous contrast. COMPARISON:  Multiple exams, CT head from 09/21/2011 FINDINGS: Brain: Small remote lacunar infarcts in the cerebellum, including medially in the left cerebellar hemisphere (image 9/2) and centrally in the right cerebellar hemisphere (image 8/2). These appear chronic. Prior watershed infarcts noted in all 4 quadrants with evidence of chronic ischemic microvascular white matter disease. Suspected small remote lacunar infarct of the head of the right caudate nucleus. Basal ganglia otherwise unremarkable. Some of the extension in the left posterior watershed region up to the left cerebral vertex this more conspicuous than on 09/21/2011, likely due to interval evolutionary findings, but appears overall chronic. No compelling findings of acute CVA. No intracranial hemorrhage or mass lesion identified. Vascular: Left vertebral artery atherosclerotic calcification. There is atherosclerotic calcification of the cavernous carotid arteries bilaterally. Skull: Unremarkable Sinuses/Orbits: Mucosal thickening in the ethmoid air cells. Other: Mild scalp soft tissue swelling along the left posterior vertex skin staples IMPRESSION: 1. No acute intracranial findings. Bilateral chronic watershed infarcts and  several small chronic lacunar infarcts as noted above. 2. Mild scalp soft tissue swelling adjacent to the left posterior vertex skin staples. 3. Mild chronic ethmoid sinusitis. 4. Atherosclerosis. Electronically Signed   By: Gaylyn Rong M.D.   On: 07/03/2017 20:24   Mr Brain Wo Contrast  Result Date: 07/04/2017 CLINICAL DATA:  Ataxia.  Multiple falls EXAM: MRI HEAD WITHOUT CONTRAST TECHNIQUE: Multiplanar, multiecho pulse sequences of the brain and surrounding structures were obtained without intravenous contrast. COMPARISON:  Head CT 07/03/2017 FINDINGS: Brain: The midline structures are normal. There is no acute infarct or acute hemorrhage. No mass lesion, hydrocephalus, dural abnormality  or extra-axial collection. Multiple bilateral small cerebellar chronic and starts. Old right occipital, left parietal bilateral frontal infarcts There is diffuse confluent hyperintense T2-weighted signal within the Set to white matter, most often seen in the setting of chronic microvascular ischemia. No age-advanced or lobar predominant atrophy. No chronic microhemorrhage or superficial siderosis. Vascular: Major intracranial arterial and venous sinus flow voids are preserved. Skull and upper cervical spine: The visualized skull base, calvarium, upper cervical spine and extracranial soft tissues are normal. Sinuses/Orbits: No fluid levels or advanced mucosal thickening. No mastoid or middle ear effusion. Normal orbits. IMPRESSION: 1. No acute abnormality. 2. Multifocal encephalomalacia at the site of old infarcts, as well as small chronic, small bilateral cerebellar infarcts. 3. Confluent white matter hyperintensity consistent with advanced chronic ischemic microangiopathy. Electronically Signed   By: Deatra Robinson M.D.   On: 07/04/2017 01:29    EKG:   Orders placed or performed during the hospital encounter of 07/03/17  . ED EKG  . ED EKG  . EKG 12-Lead  . EKG 12-Lead    IMPRESSION AND PLAN:  Principal Problem:   AKI (acute kidney injury) (HCC) -patient was on HCTZ for blood pressure control, and likely became dehydrated in part due to this medication.  His falls were almost certainly due to his dehydration, and the patient was likely orthostatic.  IV fluids ordered, avoid nephrotoxins and monitor for expected improvement Active Problems:   Ataxia -improved now after some IV fluids.  MRI showed no acute stroke.   Hypertension -continue home meds, hold HCTZ and have advised patient that he should not restart this medication.  Also holding his ACE inhibitor due to his AK I.   Anxiety and depression -continue home meds  All the records are reviewed and case discussed with ED provider. Management  plans discussed with the patient and/or family.  DVT PROPHYLAXIS: SubQ heparin  GI PROPHYLAXIS: None  ADMISSION STATUS: Observation  CODE STATUS: Full Code Status History    Date Active Date Inactive Code Status Order ID Comments User Context   12/29/2015 19:07 12/31/2015 19:37 Full Code [id number]  Enedina Finner, MD Inpatient      TOTAL TIME TAKING CARE OF THIS PATIENT: 40 minutes.   Boss Danielsen FIELDING 07/04/2017, 2:39 AM  [facility]  Office  [phone]  CC: Primary care physician; Gabriel Cirri, NP  Note:  This document was prepared using Dragon voice recognition software and may include unintentional dictation errors.

## 2017-07-04 NOTE — Progress Notes (Signed)
Per Dr. Allena KatzPatel hold Metoprolol if BP is less than 120 systolic.

## 2017-07-04 NOTE — Progress Notes (Signed)
Mark [name]  A and O x 4. VSS. Pt tolerating diet well. No complaints of pain or nausea. IV removed intact,no new prescriptions given. Pt voiced understanding of discharge instructions with no further questions. Pt discharged via wheelchair with RN.  Mark FeilAbbie Kassidi Elza MSN, RN-BC  Allergies as of 07/04/2017      Reactions   Codeine Hives      Medication List    STOP taking these medications   hydrochlorothiazide 25 MG tablet Commonly known as:  HYDRODIURIL   lisinopril 20 MG tablet Commonly known as:  PRINIVIL,ZESTRIL     TAKE these medications   aspirin 325 MG tablet Take 325 mg by mouth daily.   citalopram 40 MG tablet Commonly known as:  CELEXA Take 1 tablet (40 mg total) by mouth daily.   metoprolol tartrate 50 MG tablet Commonly known as:  LOPRESSOR Take 0.5 tablets (25 mg total) by mouth 2 (two) times daily. HOLD if your systolic BP is <120 What changed:    how much to take  additional instructions       Vitals:   07/04/17 0449 07/04/17 0920  BP: 104/75 113/75  Pulse: 63 78  Resp: 16 16  Temp: 97.9 F (36.6 C) 98.5 F (36.9 C)  SpO2: 98% 99%

## 2017-07-04 NOTE — Progress Notes (Signed)
Pt ambulated around nursing unit with no issues and gait was very steady.

## 2017-07-04 NOTE — Discharge Instructions (Signed)
Keep a log of your blood pressure at home Advised to take adequate amount of fluids at home

## 2017-07-05 LAB — HIV ANTIBODY (ROUTINE TESTING W REFLEX): HIV Screen 4th Generation wRfx: NONREACTIVE

## 2017-07-06 ENCOUNTER — Telehealth: Payer: Self-pay

## 2017-07-06 NOTE — Telephone Encounter (Signed)
Transition Care Management Follow-up Telephone Call   Date discharged? 07/04/2017   How have you been since you were released from the hospital? better   Do you understand why you were in the hospital? yes   Do you understand the discharge instructions? yes  Where were you discharged to? home  Items Reviewed:  Medications reviewed: yes  Allergies reviewed: yes  Dietary changes reviewed: yes  Referrals reviewed: yes   Functional Questionnaire:   Activities of Daily Living (ADLs):   He states they are independent in the following: ambulation, bathing and hygiene, feeding, continence, grooming, toileting and dressing States they require assistance with the following: none   Any transportation issues/concerns?: yes   Any patient concerns? yes   Confirmed importance and date/time of follow-up visits scheduled yes  Provider Appointment booked with Phineas Inchesheryl Wicker,NP on 07/16/2017 at 1:00pm  Confirmed with patient if condition begins to worsen call PCP or go to the ER.  Patient was given the office number and encouraged to call back with question or concerns.  : yes

## 2017-07-16 ENCOUNTER — Encounter: Payer: Self-pay | Admitting: Unknown Physician Specialty

## 2017-07-16 ENCOUNTER — Ambulatory Visit: Payer: Medicare HMO | Admitting: Unknown Physician Specialty

## 2017-07-16 VITALS — BP 181/112 | HR 71 | Temp 98.2°F | Wt 156.6 lb

## 2017-07-16 DIAGNOSIS — N179 Acute kidney failure, unspecified: Secondary | ICD-10-CM

## 2017-07-16 DIAGNOSIS — R5383 Other fatigue: Secondary | ICD-10-CM | POA: Diagnosis not present

## 2017-07-16 DIAGNOSIS — I10 Essential (primary) hypertension: Secondary | ICD-10-CM | POA: Diagnosis not present

## 2017-07-16 DIAGNOSIS — Z4802 Encounter for removal of sutures: Secondary | ICD-10-CM

## 2017-07-16 DIAGNOSIS — F419 Anxiety disorder, unspecified: Secondary | ICD-10-CM

## 2017-07-16 DIAGNOSIS — F329 Major depressive disorder, single episode, unspecified: Secondary | ICD-10-CM

## 2017-07-16 MED ORDER — CITALOPRAM HYDROBROMIDE 20 MG PO TABS: 20.0000 mg | ORAL_TABLET | Freq: Every day | ORAL | 3 refills | Status: DC

## 2017-07-16 MED ORDER — AMLODIPINE BESYLATE 5 MG PO TABS: 5.0000 mg | ORAL_TABLET | Freq: Every day | ORAL | 3 refills | Status: DC

## 2017-07-16 NOTE — Assessment & Plan Note (Signed)
Poor control.  Restart Citalopram at 20 mg.  No Lorazepam from this office.  Pt understands.

## 2017-07-16 NOTE — Progress Notes (Signed)
BP (!) 181/112   Pulse 71   Temp 98.2 F (36.8 C) (Oral)   Wt 156 lb 9.6 oz (71 kg)   SpO2 98%   BMI 25.28 kg/m    Subjective:    Patient ID: Mark [name], male    DOB: 10-03-1960, 56 y.o.   MRN: [id number]  HPI: Mark [name] is a 56 y.o. male  Chief Complaint  Patient presents with  . Hospitalization Follow-up   Pt was seen in the ER and then admitted to the hospital following dizzyness and a fall.  He was found to by dehydrated.  Lisiopril and HCTZ were discontinued.  He improved following fluids.    Hypertension BP is high today.  States he doesn't drink.  He is not taking Citalopram.   Repeat BP was Systolic of 151.    Depression States his biggest problem is that he is feeling depressed today.  He would like Lorazepam but the is not being prescribed from this office due to past history of substance abuse.  Depression screen [facility] 2/9 07/16/2017 05/30/2017 11/22/2016 07/26/2015  Decreased Interest 2 0 2 0  Down, Depressed, Hopeless 3 0 2 0  PHQ - 2 Score 5 0 4 0  Altered sleeping 2 - 2 -  Tired, decreased energy 3 - 3 -  Change in appetite 2 - 2 -  Feeling bad or failure about yourself  3 - 2 -  Trouble concentrating 2 - 3 -  Moving slowly or fidgety/restless 3 - 2 -  Suicidal thoughts 0 - 0 -  PHQ-9 Score 20 - 18 -   Relevant past medical, surgical, family and social history reviewed and updated as indicated. Interim medical history since our last visit reviewed. Allergies and medications reviewed and updated.  Review of Systems  Constitutional: Positive for fatigue.  HENT: Negative.   Respiratory: Negative.   Cardiovascular: Negative.     Per HPI unless specifically indicated above     Objective:    BP (!) 181/112   Pulse 71   Temp 98.2 F (36.8 C) (Oral)   Wt 156 lb 9.6 oz (71 kg)   SpO2 98%   BMI 25.28 kg/m   Wt Readings from Last 3 Encounters:  07/16/17 156 lb 9.6 oz (71 kg)  07/04/17 155 lb 6.4 oz (70.5 kg)  06/19/17 158 lb 3.2 oz  (71.8 kg)    Physical Exam  Constitutional: He is oriented to person, place, and time. He appears well-developed and well-nourished. No distress.  HENT:  Head: Normocephalic and atraumatic.  Eyes: Conjunctivae and lids are normal. Right eye exhibits no discharge. Left eye exhibits no discharge. No scleral icterus.  Neck: Normal range of motion. Neck supple. No JVD present. Carotid bruit is not present.  Cardiovascular: Normal rate, regular rhythm and normal heart sounds.  Pulmonary/Chest: Effort normal and breath sounds normal. No respiratory distress.  Abdominal: Normal appearance. There is no splenomegaly or hepatomegaly.  Musculoskeletal: Normal range of motion.  Neurological: He is alert and oriented to person, place, and time.  Skin: Skin is warm, dry and intact. No rash noted. No pallor.  Removed 3 sutures from scalp.  No bleeding, edges well approximated  Psychiatric: He has a normal mood and affect. His behavior is normal. Judgment and thought content normal.    Results for orders placed or performed during the hospital encounter of 07/03/17  CBC with Differential  Result Value Ref Range   WBC 12.3 (H) 3.8 - 10.6 K/uL  RBC 4.83 4.40 - 5.90 MIL/uL   Hemoglobin 15.6 13.0 - 18.0 g/dL   HCT 16.147.0 09.640.0 - 04.552.0 %   MCV 97.2 80.0 - 100.0 fL   MCH 32.3 26.0 - 34.0 pg   MCHC 33.3 32.0 - 36.0 g/dL   RDW 40.913.5 81.111.5 - 91.414.5 %   Platelets 354 150 - 440 K/uL   Neutrophils Relative % 56 %   Neutro Abs 6.9 (H) 1.4 - 6.5 K/uL   Lymphocytes Relative 31 %   Lymphs Abs 3.8 (H) 1.0 - 3.6 K/uL   Monocytes Relative 9 %   Monocytes Absolute 1.1 (H) 0.2 - 1.0 K/uL   Eosinophils Relative 3 %   Eosinophils Absolute 0.3 0 - 0.7 K/uL   Basophils Relative 1 %   Basophils Absolute 0.1 0 - 0.1 K/uL  Comprehensive metabolic panel  Result Value Ref Range   Sodium 134 (L) 135 - 145 mmol/L   Potassium 4.2 3.5 - 5.1 mmol/L   Chloride 93 (L) 101 - 111 mmol/L   CO2 28 22 - 32 mmol/L   Glucose, Bld 91 65  - 99 mg/dL   BUN 26 (H) 6 - 20 mg/dL   Creatinine, Ser 7.822.56 (H) 0.61 - 1.24 mg/dL   Calcium 9.4 8.9 - 95.610.3 mg/dL   Total Protein 7.3 6.5 - 8.1 g/dL   Albumin 4.3 3.5 - 5.0 g/dL   AST 27 15 - 41 U/L   ALT 36 17 - 63 U/L   Alkaline Phosphatase 102 38 - 126 U/L   Total Bilirubin 0.6 0.3 - 1.2 mg/dL   GFR calc non Af Amer 26 (L) >60 mL/min   GFR calc Af Amer 30 (L) >60 mL/min   Anion gap 13 5 - 15  Troponin I  Result Value Ref Range   Troponin I <0.03 <0.03 ng/mL  Urinalysis, Complete w Microscopic  Result Value Ref Range   Color, Urine YELLOW (A) YELLOW   APPearance CLEAR (A) CLEAR   Specific Gravity, Urine 1.006 1.005 - 1.030   pH 5.0 5.0 - 8.0   Glucose, UA NEGATIVE NEGATIVE mg/dL   Hgb urine dipstick NEGATIVE NEGATIVE   Bilirubin Urine NEGATIVE NEGATIVE   Ketones, ur NEGATIVE NEGATIVE mg/dL   Protein, ur NEGATIVE NEGATIVE mg/dL   Nitrite NEGATIVE NEGATIVE   Leukocytes, UA NEGATIVE NEGATIVE   RBC / HPF 0-5 0 - 5 RBC/hpf   WBC, UA 0-5 0 - 5 WBC/hpf   Bacteria, UA NONE SEEN NONE SEEN   Squamous Epithelial / LPF 0-5 (A) NONE SEEN   Mucus PRESENT    Hyaline Casts, UA PRESENT    Sperm, UA PRESENT   Ethanol  Result Value Ref Range   Alcohol, Ethyl (B) <10 <10 mg/dL  Urine Drug Screen, Qualitative ([facility] only)  Result Value Ref Range   Tricyclic, Ur Screen NONE DETECTED NONE DETECTED   Amphetamines, Ur Screen NONE DETECTED NONE DETECTED   MDMA (Ecstasy)Ur Screen NONE DETECTED NONE DETECTED   Cocaine Metabolite,Ur [place] NONE DETECTED NONE DETECTED   Opiate, Ur Screen NONE DETECTED NONE DETECTED   Phencyclidine (PCP) Ur S NONE DETECTED NONE DETECTED   Cannabinoid 50 Ng, Ur [place] NONE DETECTED NONE DETECTED   Barbiturates, Ur Screen NONE DETECTED NONE DETECTED   Benzodiazepine, Ur Scrn NONE DETECTED NONE DETECTED   Methadone Scn, Ur NONE DETECTED NONE DETECTED  Basic metabolic panel  Result Value Ref Range   Sodium 133 (L) 135 - 145 mmol/L   Potassium 4.3 3.5 - 5.1  mmol/L    Chloride 100 (L) 101 - 111 mmol/L   CO2 23 22 - 32 mmol/L   Glucose, Bld 95 65 - 99 mg/dL   BUN 26 (H) 6 - 20 mg/dL   Creatinine, Ser 4.092.18 (H) 0.61 - 1.24 mg/dL   Calcium 8.4 (L) 8.9 - 10.3 mg/dL   GFR calc non Af Amer 32 (L) >60 mL/min   GFR calc Af Amer 37 (L) >60 mL/min   Anion gap 10 5 - 15  HIV antibody (Routine Testing)  Result Value Ref Range   HIV Screen 4th Generation wRfx Non Reactive Non Reactive  CBC  Result Value Ref Range   WBC 7.2 3.8 - 10.6 K/uL   RBC 4.25 (L) 4.40 - 5.90 MIL/uL   Hemoglobin 13.9 13.0 - 18.0 g/dL   HCT 81.141.3 91.440.0 - 78.252.0 %   MCV 97.4 80.0 - 100.0 fL   MCH 32.8 26.0 - 34.0 pg   MCHC 33.7 32.0 - 36.0 g/dL   RDW 95.613.0 21.311.5 - 08.614.5 %   Platelets 272 150 - 440 K/uL  Basic metabolic panel  Result Value Ref Range   Sodium 137 135 - 145 mmol/L   Potassium 3.9 3.5 - 5.1 mmol/L   Chloride 105 101 - 111 mmol/L   CO2 26 22 - 32 mmol/L   Glucose, Bld 94 65 - 99 mg/dL   BUN 21 (H) 6 - 20 mg/dL   Creatinine, Ser 5.781.54 (H) 0.61 - 1.24 mg/dL   Calcium 8.2 (L) 8.9 - 10.3 mg/dL   GFR calc non Af Amer 48 (L) >60 mL/min   GFR calc Af Amer 56 (L) >60 mL/min   Anion gap 6 5 - 15      Assessment & Plan:   Problem List Items Addressed This Visit      Unprioritized   AKI (acute kidney injury) (HCC)    Check CMP today      Relevant Orders   Comprehensive metabolic panel   Anxiety and depression    Poor control.  Restart Citalopram at 20 mg.  No Lorazepam from this office.  Pt understands.        Relevant Medications   citalopram (CELEXA) 20 MG tablet   Hypertension    Start Amlodipine as was on HCTZ and Lisinopril when he became dehydrated.        Relevant Medications   amLODipine (NORVASC) 5 MG tablet    Other Visit Diagnoses    Fatigue, unspecified type    -  Primary   Probably related to depression.  Check labs to rule out other causes   Relevant Orders   TSH   Removal of staples       Tolerated well.  Routine care       Follow up  plan: Return in about 4 weeks (around 08/13/2017).

## 2017-07-16 NOTE — Assessment & Plan Note (Signed)
Start Amlodipine as was on HCTZ and Lisinopril when he became dehydrated.

## 2017-07-16 NOTE — Assessment & Plan Note (Signed)
Check CMP today 

## 2017-07-17 ENCOUNTER — Encounter: Payer: Self-pay | Admitting: Unknown Physician Specialty

## 2017-07-17 LAB — COMPREHENSIVE METABOLIC PANEL
ALK PHOS: 91 IU/L (ref 39–117)
ALT: 19 IU/L (ref 0–44)
AST: 13 IU/L (ref 0–40)
Albumin/Globulin Ratio: 1.9 (ref 1.2–2.2)
Albumin: 4.1 g/dL (ref 3.5–5.5)
BUN/Creatinine Ratio: 9 (ref 9–20)
BUN: 10 mg/dL (ref 6–24)
Bilirubin Total: 0.3 mg/dL (ref 0.0–1.2)
CHLORIDE: 106 mmol/L (ref 96–106)
CO2: 23 mmol/L (ref 20–29)
CREATININE: 1.11 mg/dL (ref 0.76–1.27)
Calcium: 9.2 mg/dL (ref 8.7–10.2)
GFR calc Af Amer: 85 mL/min/{1.73_m2} (ref >59)
GFR calc non Af Amer: 73 mL/min/{1.73_m2} (ref >59)
GLUCOSE: 92 mg/dL (ref 65–99)
Globulin, Total: 2.2 g/dL (ref 1.5–4.5)
Potassium: 5.4 mmol/L — ABNORMAL HIGH (ref 3.5–5.2)
Sodium: 143 mmol/L (ref 134–144)
Total Protein: 6.3 g/dL (ref 6.0–8.5)

## 2017-07-17 LAB — TSH: TSH: 1.76 u[IU]/mL (ref 0.450–4.500)

## 2017-07-20 ENCOUNTER — Ambulatory Visit: Payer: Medicare HMO | Admitting: Unknown Physician Specialty

## 2017-08-15 ENCOUNTER — Ambulatory Visit: Payer: Medicare HMO | Admitting: Unknown Physician Specialty

## 2017-08-20 ENCOUNTER — Ambulatory Visit: Payer: Medicare HMO | Admitting: Unknown Physician Specialty

## 2017-08-20 ENCOUNTER — Telehealth: Payer: Self-pay | Admitting: Unknown Physician Specialty

## 2017-08-20 NOTE — Telephone Encounter (Signed)
No show.  Not urgent

## 2017-08-23 NOTE — Telephone Encounter (Signed)
No showed x 3.  Will send no show warning letter

## 2017-08-30 ENCOUNTER — Encounter: Payer: Self-pay | Admitting: Unknown Physician Specialty

## 2017-09-09 ENCOUNTER — Encounter: Payer: Self-pay | Admitting: Unknown Physician Specialty

## 2017-09-09 NOTE — Telephone Encounter (Signed)
Done

## 2017-09-18 ENCOUNTER — Encounter: Payer: Self-pay | Admitting: Unknown Physician Specialty

## 2017-09-18 ENCOUNTER — Ambulatory Visit: Payer: Medicare HMO | Admitting: Unknown Physician Specialty

## 2017-09-18 VITALS — BP 121/85 | HR 62 | Temp 98.4°F | Wt 156.3 lb

## 2017-09-18 DIAGNOSIS — F329 Major depressive disorder, single episode, unspecified: Secondary | ICD-10-CM

## 2017-09-18 DIAGNOSIS — F32A Depression, unspecified: Secondary | ICD-10-CM

## 2017-09-18 DIAGNOSIS — R059 Cough, unspecified: Secondary | ICD-10-CM

## 2017-09-18 DIAGNOSIS — F419 Anxiety disorder, unspecified: Secondary | ICD-10-CM

## 2017-09-18 DIAGNOSIS — I10 Essential (primary) hypertension: Secondary | ICD-10-CM | POA: Diagnosis not present

## 2017-09-18 DIAGNOSIS — R05 Cough: Secondary | ICD-10-CM | POA: Diagnosis not present

## 2017-09-18 MED ORDER — AZITHROMYCIN 250 MG PO TABS: ORAL_TABLET | ORAL | 0 refills | Status: DC

## 2017-09-18 MED ORDER — BENZONATATE 100 MG PO CAPS: 200.0000 mg | ORAL_CAPSULE | Freq: Three times a day (TID) | ORAL | 0 refills | Status: DC | PRN

## 2017-09-18 MED ORDER — ALBUTEROL SULFATE HFA 108 (90 BASE) MCG/ACT IN AERS: 2.0000 | INHALATION_SPRAY | Freq: Four times a day (QID) | RESPIRATORY_TRACT | 2 refills | Status: DC | PRN

## 2017-09-18 MED ORDER — CITALOPRAM HYDROBROMIDE 20 MG PO TABS: 20.0000 mg | ORAL_TABLET | Freq: Every day | ORAL | 3 refills | Status: DC

## 2017-09-18 NOTE — Assessment & Plan Note (Addendum)
Refill Citalopram. Not to goal.  I have referred him to psychiatry in the past.  I will make another referral.

## 2017-09-18 NOTE — Assessment & Plan Note (Signed)
Stable, continue present medications.   

## 2017-09-18 NOTE — Progress Notes (Signed)
BP 121/85 (BP Location: Left Arm, Cuff Size: Normal)   Pulse 62   Temp 98.4 F (36.9 C) (Oral)   Wt 156 lb 4.8 oz (70.9 kg)   SpO2 97%   BMI 25.23 kg/m    Subjective:    Patient ID: Mark [name], male    DOB: 27-Nov-1960, 57 y.o.   MRN: [id number]  HPI: Mark [name] is a 57 y.o. male  Chief Complaint  Patient presents with  . Anxiety  . Hypertension  . Medication Problem    pt states that he and his mother need to know what medications he is supposed to be taking  . Cough    pt states he has had a cough for a month and would like something for it    Anxiety Pt is taking Citalpram daily.  Pt states this is working well for his anxiety and depression.  Discussed PHQ 9 reults and pt feels "I'm OK" and does not want to make changes Depression screen [facility] 2/9 09/18/2017 07/16/2017 05/30/2017 11/22/2016 07/26/2015  Decreased Interest 3 2 0 2 0  Down, Depressed, Hopeless 2 3 0 2 0  PHQ - 2 Score 5 5 0 4 0  Altered sleeping 2 2 - 2 -  Tired, decreased energy 2 3 - 3 -  Change in appetite 2 2 - 2 -  Feeling bad or failure about yourself  2 3 - 2 -  Trouble concentrating 2 2 - 3 -  Moving slowly or fidgety/restless 2 3 - 2 -  Suicidal thoughts 2 0 - 0 -  PHQ-9 Score 19 20 - 18 -   Hypertension Using medications without difficulty Average home BPs "about the same as here"   No problems or lightheadedness No chest pain with exertion or shortness of breath No Edema  Cough  This is a new problem. Episode onset: 4 weeks. The problem has been unchanged. The problem occurs constantly. The cough is non-productive. Pertinent negatives include no chest pain, chills, ear congestion, ear pain, fever, headaches, heartburn, myalgias, rash, rhinorrhea, sore throat, sweats, weight loss or wheezing. Nothing aggravates the symptoms. He has tried nothing for the symptoms. positive smoker   Social History   Socioeconomic History  . Marital status: Divorced    Spouse name: Not on file    . Number of children: Not on file  . Years of education: Not on file  . Highest education level: Not on file  Social Needs  . Financial resource strain: Not on file  . Food insecurity - worry: Not on file  . Food insecurity - inability: Not on file  . Transportation needs - medical: Not on file  . Transportation needs - non-medical: Not on file  Occupational History  . Not on file  Tobacco Use  . Smoking status: Current Every Day Smoker    Packs/day: 1.00    Types: Cigarettes  . Smokeless tobacco: Never Used  Substance and Sexual Activity  . Alcohol use: No    Alcohol/week: 0.0 oz  . Drug use: No  . Sexual activity: Not on file  Other Topics Concern  . Not on file  Social History Narrative  . Not on file     Relevant past medical, surgical, family and social history reviewed and updated as indicated. Interim medical history since our last visit reviewed. Allergies and medications reviewed and updated.  Review of Systems  Constitutional: Negative for chills, fever and weight loss.  HENT: Negative for ear pain,  rhinorrhea and sore throat.   Respiratory: Positive for cough. Negative for wheezing.   Cardiovascular: Negative for chest pain.  Gastrointestinal: Negative for heartburn.  Musculoskeletal: Negative for myalgias.  Skin: Negative for rash.  Neurological: Negative for headaches.    Per HPI unless specifically indicated above     Objective:    BP 121/85 (BP Location: Left Arm, Cuff Size: Normal)   Pulse 62   Temp 98.4 F (36.9 C) (Oral)   Wt 156 lb 4.8 oz (70.9 kg)   SpO2 97%   BMI 25.23 kg/m   Wt Readings from Last 3 Encounters:  09/18/17 156 lb 4.8 oz (70.9 kg)  07/16/17 156 lb 9.6 oz (71 kg)  07/04/17 155 lb 6.4 oz (70.5 kg)    Physical Exam  Constitutional: He is oriented to person, place, and time. He appears well-developed and well-nourished. No distress.  HENT:  Head: Normocephalic and atraumatic.  Eyes: Conjunctivae and lids are normal.  Right eye exhibits no discharge. Left eye exhibits no discharge. No scleral icterus.  Neck: Normal range of motion. Neck supple. No JVD present. Carotid bruit is not present.  Cardiovascular: Normal rate, regular rhythm and normal heart sounds.  Pulmonary/Chest: Effort normal. No respiratory distress. He has wheezes.  Abdominal: Normal appearance. There is no splenomegaly or hepatomegaly.  Musculoskeletal: Normal range of motion.  Neurological: He is alert and oriented to person, place, and time.  Skin: Skin is warm, dry and intact. No rash noted. No pallor.  Psychiatric: He has a normal mood and affect. His behavior is normal. Judgment and thought content normal.    Results for orders placed or performed in visit on 07/16/17  Comprehensive metabolic panel  Result Value Ref Range   Glucose 92 65 - 99 mg/dL   BUN 10 6 - 24 mg/dL   Creatinine, Ser 1.611.11 0.76 - 1.27 mg/dL   GFR calc non Af Amer 73 >59 mL/min/1.73   GFR calc Af Amer 85 >59 mL/min/1.73   BUN/Creatinine Ratio 9 9 - 20   Sodium 143 134 - 144 mmol/L   Potassium 5.4 (H) 3.5 - 5.2 mmol/L   Chloride 106 96 - 106 mmol/L   CO2 23 20 - 29 mmol/L   Calcium 9.2 8.7 - 10.2 mg/dL   Total Protein 6.3 6.0 - 8.5 g/dL   Albumin 4.1 3.5 - 5.5 g/dL   Globulin, Total 2.2 1.5 - 4.5 g/dL   Albumin/Globulin Ratio 1.9 1.2 - 2.2   Bilirubin Total 0.3 0.0 - 1.2 mg/dL   Alkaline Phosphatase 91 39 - 117 IU/L   AST 13 0 - 40 IU/L   ALT 19 0 - 44 IU/L  TSH  Result Value Ref Range   TSH 1.760 0.450 - 4.500 uIU/mL      Assessment & Plan:   Problem List Items Addressed This Visit      Unprioritized   Anxiety and depression    Refill Citalopram. Not to goal.  I have referred him to psychiatry in the past.  I will make another referral.        Relevant Medications   citalopram (CELEXA) 20 MG tablet   Other Relevant Orders   Ambulatory referral to Psychiatry   Hypertension    Stable, continue present medications.         Other Visit  Diagnoses    Cough    -  Primary   Pt with a wheeze, probably related to smoking.  R/o bacterial infection with x-ray and rx for  Zithromax.  Tessalon perles for cough.  NO NARCOTIC COUGH MEDS   Relevant Orders   DG Chest 2 View       Follow up plan: Return in about 6 months (around 03/18/2018).

## 2017-09-19 ENCOUNTER — Telehealth: Payer: Self-pay | Admitting: Unknown Physician Specialty

## 2017-09-19 ENCOUNTER — Other Ambulatory Visit: Payer: Self-pay | Admitting: Unknown Physician Specialty

## 2017-09-19 NOTE — Telephone Encounter (Signed)
Copied from CRM [phone]#[id number]. Topic: Quick Communication - See Telephone Encounter >> Sep 19, 2017  9:07 AM Oneal GroutSebastian, Jennifer S wrote: CRM for notification. See Telephone encounter for: noticed on AVS yesterday that lisinopril 20mg  is not listed, is he no longer to take this med?  09/19/17.

## 2017-09-19 NOTE — Telephone Encounter (Signed)
Reviewed chart; noted the Lisinopril and HCTZ was discontinued on 07/04/17 when discharged from hospital.  Also noted Gabriel Cirriheryl Wicker, NP, ordered Amlodipine 5 mg. Qd., when pt. was at his appt. on 07/16/17.  Phone call to pt.  Spoke with pt's mother, Tyler AasDoris.  Advised of the above.  Reviewed pt's current medication list with mother.  Verb. Understanding.  Encouraged to call back with any further questions.

## 2017-10-08 ENCOUNTER — Ambulatory Visit: Payer: Medicare HMO | Admitting: Psychiatry

## 2017-11-30 ENCOUNTER — Other Ambulatory Visit: Payer: Self-pay | Admitting: Unknown Physician Specialty

## 2018-01-08 ENCOUNTER — Telehealth: Payer: Self-pay

## 2018-01-08 DIAGNOSIS — G8191 Hemiplegia, unspecified affecting right dominant side: Secondary | ICD-10-CM

## 2018-01-08 DIAGNOSIS — R4189 Other symptoms and signs involving cognitive functions and awareness: Secondary | ICD-10-CM

## 2018-01-08 NOTE — Telephone Encounter (Signed)
OK 

## 2018-01-08 NOTE — Telephone Encounter (Signed)
Copied from CRM [id number]. Topic: Referral - Request >> Jan 08, 2018  2:05 PM Terisa Starr wrote: Reason for CRM: Patient is requesting to have a referral sent to [facility] physical therapy department to get his driving license back. The number is [phone]  >> Jan 08, 2018  4:43 PM Sharol Given wrote: please advise.    Routing to provider for referral.

## 2018-02-10 ENCOUNTER — Other Ambulatory Visit: Payer: Self-pay | Admitting: Unknown Physician Specialty

## 2018-02-16 ENCOUNTER — Other Ambulatory Visit: Payer: Self-pay | Admitting: Unknown Physician Specialty

## 2018-02-19 ENCOUNTER — Telehealth: Payer: Self-pay | Admitting: Unknown Physician Specialty

## 2018-02-19 NOTE — Telephone Encounter (Signed)
[facility] called and [facility] [place] who says the patient has a refill on hold and she will go ahead and refill Citalopram.

## 2018-02-19 NOTE — Telephone Encounter (Signed)
Copied from CRM [phone]#[id number]. Topic: Quick Communication - See Telephone Encounter >> Feb 19, 2018  9:02 AM Tamela OddiMartin, Don'Quashia, NT wrote: CRM for notification. See Telephone encounter for: 02/19/18. Patient called and states he needs a refill of his citalopram (CELEXA) 20 MG tablet   [id number] [place], [place] - [address] [phone] (Phone) [phone] (Fax)

## 2018-03-19 ENCOUNTER — Ambulatory Visit (INDEPENDENT_AMBULATORY_CARE_PROVIDER_SITE_OTHER): Payer: Medicare HMO | Admitting: Physician Assistant

## 2018-03-19 ENCOUNTER — Other Ambulatory Visit: Payer: Self-pay

## 2018-03-19 ENCOUNTER — Encounter: Payer: Self-pay | Admitting: Physician Assistant

## 2018-03-19 VITALS — BP 142/97 | HR 65 | Temp 98.5°F | Ht 68.0 in | Wt 155.0 lb

## 2018-03-19 DIAGNOSIS — E785 Hyperlipidemia, unspecified: Secondary | ICD-10-CM

## 2018-03-19 DIAGNOSIS — Z5320 Procedure and treatment not carried out because of patient's decision for unspecified reasons: Secondary | ICD-10-CM | POA: Diagnosis not present

## 2018-03-19 DIAGNOSIS — I10 Essential (primary) hypertension: Secondary | ICD-10-CM

## 2018-03-19 MED ORDER — AMLODIPINE BESYLATE 10 MG PO TABS: 10.0000 mg | ORAL_TABLET | Freq: Every day | ORAL | 0 refills | Status: DC

## 2018-03-19 MED ORDER — METOPROLOL TARTRATE 25 MG PO TABS: 25.0000 mg | ORAL_TABLET | Freq: Two times a day (BID) | ORAL | 0 refills | Status: DC

## 2018-03-19 NOTE — Patient Instructions (Signed)

## 2018-03-19 NOTE — Progress Notes (Signed)
Subjective:    Patient ID: Mark [name], male    DOB: Dec 19, 1960, 57 y.o.   MRN: [id number]  Mark [name] is a 57 y.o. male presenting on 03/19/2018 for Depression and Hypertension   HPI   History of strokes.   HTN: taking 5 mg amlodipine, and metoprolol tartate 25 mg BID.  BP Readings from Last 3 Encounters:  03/19/18 (!) 142/97  09/18/17 121/85  07/16/17 (!) 181/112   Tobacco Abuse: Has smoked 1 pack a day for forty years. Qualifies for low dose CT scan for lung cancer screening.   Social History   Tobacco Use  . Smoking status: Current Every Day Smoker    Packs/day: 1.00    Types: Cigarettes  . Smokeless tobacco: Never Used  Substance Use Topics  . Alcohol use: No    Alcohol/week: 0.0 oz  . Drug use: No    Review of Systems Per HPI unless specifically indicated above     Objective:    BP (!) 142/97   Pulse 65   Temp 98.5 F (36.9 C) (Oral)   Ht 5' 8"  (1.727 m)   Wt 155 lb (70.3 kg)   SpO2 95%   BMI 23.57 kg/m   Wt Readings from Last 3 Encounters:  03/19/18 155 lb (70.3 kg)  09/18/17 156 lb 4.8 oz (70.9 kg)  07/16/17 156 lb 9.6 oz (71 kg)    Physical Exam Results for orders placed or performed in visit on 03/19/18  Comp Met (CMET)  Result Value Ref Range   Glucose 91 65 - 99 mg/dL   BUN 7 6 - 24 mg/dL   Creatinine, Ser 1.17 0.76 - 1.27 mg/dL   GFR calc non Af Amer 69 >59 mL/min/1.73   GFR calc Af Amer 80 >59 mL/min/1.73   BUN/Creatinine Ratio 6 (L) 9 - 20   Sodium 139 134 - 144 mmol/L   Potassium 5.4 (H) 3.5 - 5.2 mmol/L   Chloride 102 96 - 106 mmol/L   CO2 24 20 - 29 mmol/L   Calcium 9.7 8.7 - 10.2 mg/dL   Total Protein 6.9 6.0 - 8.5 g/dL   Albumin 4.5 3.5 - 5.5 g/dL   Globulin, Total 2.4 1.5 - 4.5 g/dL   Albumin/Globulin Ratio 1.9 1.2 - 2.2   Bilirubin Total 0.3 0.0 - 1.2 mg/dL   Alkaline Phosphatase 126 (H) 39 - 117 IU/L   AST 23 0 - 40 IU/L   ALT 46 (H) 0 - 44 IU/L  Lipid Profile  Result Value Ref Range   Cholesterol,  Total 165 100 - 199 mg/dL   Triglycerides 151 (H) 0 - 149 mg/dL   HDL 37 (L) >39 mg/dL   VLDL Cholesterol Cal 30 5 - 40 mg/dL   LDL Calculated 98 0 - 99 mg/dL   Chol/HDL Ratio 4.5 0.0 - 5.0 ratio      Assessment & Plan:   1. Essential hypertension  Uncontrolled today, will increase amlodipine. Continue metoprolol.   - metoprolol tartrate (LOPRESSOR) 25 MG tablet; Take 1 tablet (25 mg total) by mouth 2 (two) times daily.  Dispense: 180 tablet; Refill: 0 - amLODipine (NORVASC) 10 MG tablet; Take 1 tablet (10 mg total) by mouth daily.  Dispense: 90 tablet; Refill: 0 - Comp Met (CMET)  2. Hyperlipidemia, unspecified hyperlipidemia type  Will check again, may need statin.  - Lipid Profile  3. Lung cancer screening declined by patient  Declined. Counseled about risks regarding this. Needs to stop smoking. Counseled >  3 minutes regarding smoking cessation.     Follow up plan: Return in about 2 weeks (around 04/02/2018) for HTN.  Carles Collet, PA-C [facility] 03/20/2018, 2:52 PM

## 2018-03-20 LAB — COMPREHENSIVE METABOLIC PANEL
ALT: 46 IU/L — ABNORMAL HIGH (ref 0–44)
AST: 23 IU/L (ref 0–40)
Albumin/Globulin Ratio: 1.9 (ref 1.2–2.2)
Albumin: 4.5 g/dL (ref 3.5–5.5)
Alkaline Phosphatase: 126 IU/L — ABNORMAL HIGH (ref 39–117)
BUN/Creatinine Ratio: 6 — ABNORMAL LOW (ref 9–20)
BUN: 7 mg/dL (ref 6–24)
Bilirubin Total: 0.3 mg/dL (ref 0.0–1.2)
CO2: 24 mmol/L (ref 20–29)
Calcium: 9.7 mg/dL (ref 8.7–10.2)
Chloride: 102 mmol/L (ref 96–106)
Creatinine, Ser: 1.17 mg/dL (ref 0.76–1.27)
GFR calc Af Amer: 80 mL/min/{1.73_m2} (ref >59)
GFR calc non Af Amer: 69 mL/min/{1.73_m2} (ref >59)
Globulin, Total: 2.4 g/dL (ref 1.5–4.5)
Glucose: 91 mg/dL (ref 65–99)
Potassium: 5.4 mmol/L — ABNORMAL HIGH (ref 3.5–5.2)
Sodium: 139 mmol/L (ref 134–144)
Total Protein: 6.9 g/dL (ref 6.0–8.5)

## 2018-03-20 LAB — LIPID PANEL
Chol/HDL Ratio: 4.5 ratio (ref 0.0–5.0)
Cholesterol, Total: 165 mg/dL (ref 100–199)
HDL: 37 mg/dL — ABNORMAL LOW (ref >39)
LDL Calculated: 98 mg/dL (ref 0–99)
Triglycerides: 151 mg/dL — ABNORMAL HIGH (ref 0–149)
VLDL Cholesterol Cal: 30 mg/dL (ref 5–40)

## 2018-04-03 ENCOUNTER — Ambulatory Visit (INDEPENDENT_AMBULATORY_CARE_PROVIDER_SITE_OTHER): Payer: Medicare HMO | Admitting: Physician Assistant

## 2018-04-03 ENCOUNTER — Other Ambulatory Visit: Payer: Self-pay

## 2018-04-03 ENCOUNTER — Encounter: Payer: Self-pay | Admitting: Physician Assistant

## 2018-04-03 VITALS — BP 141/97 | HR 81 | Temp 98.0°F | Ht 68.0 in | Wt 152.0 lb

## 2018-04-03 DIAGNOSIS — I10 Essential (primary) hypertension: Secondary | ICD-10-CM | POA: Diagnosis not present

## 2018-04-03 MED ORDER — LISINOPRIL-HYDROCHLOROTHIAZIDE 10-12.5 MG PO TABS: 1.0000 | ORAL_TABLET | Freq: Every day | ORAL | 0 refills | Status: DC

## 2018-04-03 NOTE — Progress Notes (Signed)
Subjective:    Patient ID: Mark [name], male    DOB: August 29, 1960, 57 y.o.   MRN: [id number]  Mark [name] is a 57 y.o. male presenting on 04/03/2018 for Hypertension   HPI   Reports he is taking the amlodipine that was increased to 10 mg last visit. Reports he is still taking metoprolol tartrate 25 mg BID. Denies chest pain, SOB. 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   BP Readings from Last 3 Encounters:  04/03/18 (!) 141/97  03/19/18 (!) 142/97  09/18/17 121/85     Social History   Tobacco Use  . Smoking status: Current Every Day Smoker    Packs/day: 1.00    Types: Cigarettes  . Smokeless tobacco: Never Used  Substance Use Topics  . Alcohol use: No    Alcohol/week: 0.0 standard drinks  . Drug use: No    Review of Systems Per HPI unless specifically indicated above     Objective:    BP (!) 141/97   Pulse 81   Temp 98 F (36.7 C) (Oral)   Ht 5' 8" (1.727 m)   Wt 152 lb (68.9 kg)   SpO2 97%   BMI 23.11 kg/m   Wt Readings from Last 3 Encounters:  04/03/18 152 lb (68.9 kg)  03/19/18 155 lb (70.3 kg)  09/18/17 156 lb 4.8 oz (70.9 kg)    Physical Exam  Constitutional: He is oriented to person, place, and time. He appears well-developed and well-nourished.  Cardiovascular: Normal rate and regular rhythm.  Pulmonary/Chest: Effort normal and breath sounds normal.  Neurological: He is alert and oriented to person, place, and time.  Skin: Skin is warm and dry.  Psychiatric: He has a normal mood and affect. His behavior is normal.   Results for orders placed or performed in visit on 03/19/18  Comp Met (CMET)  Result Value Ref Range   Glucose 91 65 - 99 mg/dL   BUN 7 6 - 24 mg/dL   Creatinine, Ser 1.17 0.76 - 1.27 mg/dL   GFR calc non Af Amer 69 >59 mL/min/1.73   GFR calc Af Amer 80 >59 mL/min/1.73   BUN/Creatinine Ratio 6 (L) 9 - 20   Sodium 139 134 - 144 mmol/L   Potassium 5.4 (H) 3.5 - 5.2 mmol/L   Chloride 102 96 - 106 mmol/L   CO2 24 20 - 29 mmol/L   Calcium 9.7 8.7 - 10.2 mg/dL   Total Protein 6.9 6.0 - 8.5 g/dL   Albumin 4.5 3.5 - 5.5 g/dL   Globulin, Total 2.4 1.5 - 4.5 g/dL   Albumin/Globulin Ratio 1.9 1.2 - 2.2   Bilirubin Total 0.3 0.0 - 1.2 mg/dL   Alkaline Phosphatase 126 (H) 39 - 117 IU/L   AST 23 0 - 40 IU/L   ALT 46 (H) 0 - 44 IU/L  Lipid Profile  Result Value Ref Range   Cholesterol, Total 165 100 - 199 mg/dL   Triglycerides 151 (H) 0 - 149 mg/dL   HDL 37 (L) >39 mg/dL   VLDL Cholesterol Cal 30 5 - 40 mg/dL   LDL Calculated 98 0 - 99 mg/dL   Chol/HDL Ratio 4.5 0.0 - 5.0 ratio      Assessment & Plan:  1. Essential hypertension  BP is exactly the same as the last time. I do wonder if he is taking the medication correctly as he has had a stroke and his BP has not moved with increase to 10  mg amlodipine. Add as below and follow up CMEt in one month.   - lisinopril-hydrochlorothiazide (PRINZIDE,ZESTORETIC) 10-12.5 MG tablet; Take 1 tablet by mouth daily.  Dispense: 90 tablet; Refill: 0    Follow up plan: Return in about 1 month (around 05/04/2018) for htn,.  Adriana Pollak, PA-C [facility]  [facility] 04/04/2018, 4:50 PM  

## 2018-04-04 NOTE — Patient Instructions (Signed)

## 2018-04-11 ENCOUNTER — Telehealth: Payer: Self-pay | Admitting: Unknown Physician Specialty

## 2018-04-11 NOTE — Telephone Encounter (Signed)
Copied from CRM [id number]. Topic: Medicare AWV >> Apr 11, 2018 11:10 AM Waldemar DickensBrown, Kathryn N wrote: Called to schedule Medicare Annual Wellness Visit with the Nurse Health Advisor.   If patient returns call, please note: their last AWV was on 10/ 17/18 please schedule AWV with [facility] any date after May 30 2018  Thank you! For any questions please contact: Manuela SchwartzKathryn Brown [phone] or Skype at: [email or web address]

## 2018-05-08 ENCOUNTER — Ambulatory Visit: Payer: Medicare HMO | Admitting: Physician Assistant

## 2018-05-22 ENCOUNTER — Ambulatory Visit: Payer: Medicare HMO | Admitting: Nurse Practitioner

## 2018-05-29 ENCOUNTER — Ambulatory Visit: Payer: Medicare HMO | Admitting: Nurse Practitioner

## 2018-05-31 ENCOUNTER — Ambulatory Visit: Payer: Medicare HMO

## 2018-05-31 ENCOUNTER — Encounter: Payer: Medicare HMO | Admitting: Nurse Practitioner

## 2018-06-04 ENCOUNTER — Encounter: Payer: Self-pay | Admitting: Nurse Practitioner

## 2018-06-04 ENCOUNTER — Ambulatory Visit (INDEPENDENT_AMBULATORY_CARE_PROVIDER_SITE_OTHER): Payer: Medicare HMO | Admitting: Nurse Practitioner

## 2018-06-04 VITALS — BP 100/70 | HR 72 | Temp 98.1°F | Ht 65.16 in | Wt 153.0 lb

## 2018-06-04 DIAGNOSIS — I6389 Other cerebral infarction: Secondary | ICD-10-CM | POA: Diagnosis not present

## 2018-06-04 DIAGNOSIS — F419 Anxiety disorder, unspecified: Secondary | ICD-10-CM | POA: Diagnosis not present

## 2018-06-04 DIAGNOSIS — F329 Major depressive disorder, single episode, unspecified: Secondary | ICD-10-CM | POA: Diagnosis not present

## 2018-06-04 DIAGNOSIS — Z0000 Encounter for general adult medical examination without abnormal findings: Secondary | ICD-10-CM

## 2018-06-04 DIAGNOSIS — I10 Essential (primary) hypertension: Secondary | ICD-10-CM | POA: Diagnosis not present

## 2018-06-04 MED ORDER — LISINOPRIL 5 MG PO TABS: 5.0000 mg | ORAL_TABLET | Freq: Every day | ORAL | 3 refills | Status: DC

## 2018-06-04 NOTE — Assessment & Plan Note (Addendum)
H/O x 2 CVA.  Continue ASA and to monitor lipid panel, initiate statin if LDL >100 for prevention. 

## 2018-06-04 NOTE — Patient Instructions (Signed)
Discontinuing Lisinopril-Hydrochlorothiazide as BP on low side at exam today and do not want risk for falls.  Will start Lisinopril 5MG  by mouth once a day and continue to take Amlodipine and Metoprolol as ordered.  Hypertension Hypertension is another name for high blood pressure. High blood pressure forces your heart to work harder to pump blood. This can cause problems over time. There are two numbers in a blood pressure reading. There is a top number (systolic) over a bottom number (diastolic). It is best to have a blood pressure below 120/80. Healthy choices can help lower your blood pressure. You may need medicine to help lower your blood pressure if:  Your blood pressure cannot be lowered with healthy choices.  Your blood pressure is higher than 130/80.  Follow these instructions at home: Eating and drinking  If directed, follow the DASH eating plan. This diet includes: ? Filling half of your plate at each meal with fruits and vegetables. ? Filling one quarter of your plate at each meal with whole grains. Whole grains include whole wheat pasta, brown rice, and whole grain bread. ? Eating or drinking low-fat dairy products, such as skim milk or low-fat yogurt. ? Filling one quarter of your plate at each meal with low-fat (lean) proteins. Low-fat proteins include fish, skinless chicken, eggs, beans, and tofu. ? Avoiding fatty meat, cured and processed meat, or chicken with skin. ? Avoiding premade or processed food.  Eat less than 1,500 mg of salt (sodium) a day.  Limit alcohol use to no more than 1 drink a day for nonpregnant women and 2 drinks a day for men. One drink equals 12 oz of beer, 5 oz of wine, or 1 oz of hard liquor. Lifestyle  Work with your doctor to stay at a healthy weight or to lose weight. Ask your doctor what the best weight is for you.  Get at least 30 minutes of exercise that causes your heart to beat faster (aerobic exercise) most days of the week. This may  include walking, swimming, or biking.  Get at least 30 minutes of exercise that strengthens your muscles (resistance exercise) at least 3 days a week. This may include lifting weights or pilates.  Do not use any products that contain nicotine or tobacco. This includes cigarettes and e-cigarettes. If you need help quitting, ask your doctor.  Check your blood pressure at home as told by your doctor.  Keep all follow-up visits as told by your doctor. This is important. Medicines  Take over-the-counter and prescription medicines only as told by your doctor. Follow directions carefully.  Do not skip doses of blood pressure medicine. The medicine does not work as well if you skip doses. Skipping doses also puts you at risk for problems.  Ask your doctor about side effects or reactions to medicines that you should watch for. Contact a doctor if:  You think you are having a reaction to the medicine you are taking.  You have headaches that keep coming back (recurring).  You feel dizzy.  You have swelling in your ankles.  You have trouble with your vision. Get help right away if:  You get a very bad headache.  You start to feel confused.  You feel weak or numb.  You feel faint.  You get very bad pain in your: ? Chest. ? Belly (abdomen).  You throw up (vomit) more than once.  You have trouble breathing. Summary  Hypertension is another name for high blood pressure.  Making healthy  choices can help lower blood pressure. If your blood pressure cannot be controlled with healthy choices, you may need to take medicine. This information is not intended to replace advice given to you by your health care provider. Make sure you discuss any questions you have with your health care provider. Document Released: 01/17/2008 Document Revised: 06/28/2016 Document Reviewed: 06/28/2016 Elsevier Interactive Patient Education  [employer].

## 2018-06-04 NOTE — Progress Notes (Signed)
BP 100/70 (BP Location: Left Arm, Patient Position: Sitting, Cuff Size: Normal)   Pulse 72   Temp 98.1 F (36.7 C)   Ht 5' 5.16" (1.655 m)   Wt 153 lb (69.4 kg)   SpO2 98%   BMI 25.34 kg/m    Subjective:    Patient ID: Mark [name], male    DOB: Aug 10, 1961, 57 y.o.   MRN: [id number]  HPI: Mark [name] is a 57 y.o. male presents for annual physical exam.  Chief Complaint  Patient presents with  . Annual Exam   HYPERTENSION (h/o CVA x 2) At last visit 04/03/18 Lisinopril-HCTZ 10-12.5 was started.  Continues on Amlodipine 10MG, which was increased to this dose on 03/19/18, and Metoprolol 25MG BID.  He reports that he lives with his mother and she is in charge of his medications.  She ensures he takes medications as directed.  Due to h/o CVA with aphasia and memory changes he states that his mother assists him frequently.  At this time patient denies any concerns or questions.  Reports that he gets frequent exercises, walks a lot (even to provider appointments).  No recent falls or injuries. Hypertension status: better  Satisfied with current treatment? yes Duration of hypertension: chronic BP monitoring frequency:  not checking BP range:  BP medication side effects:  no Medication compliance: fair compliance, he reports his mother is in charge of his medications Previous BP meds: Lisinopril-HCTZ Aspirin: yes Recurrent headaches: no Visual changes: no Palpitations: no Dyspnea: no Chest pain: no Lower extremity edema: no Dizzy/lightheaded: no   DEPRESSION Mood status: stable Satisfied with current treatment?: yes Symptom severity: mild  Duration of current treatment : chronic Side effects: no Medication compliance: good compliance Psychotherapy/counseling: no none Previous psychiatric medications: does not recall Depressed mood: no Anxious mood: no Anhedonia: no Significant weight loss or gain: no Insomnia: no has no issues with sleep per his report Fatigue:  no Feelings of worthlessness or guilt: no Impaired concentration/indecisiveness: no Suicidal ideations: no Hopelessness: no Crying spells: no Depression screen [facility] 2/9 06/04/2018 09/18/2017 07/16/2017 05/30/2017 11/22/2016  Decreased Interest 0 3 2 0 2  Down, Depressed, Hopeless 0 2 3 0 2  PHQ - 2 Score 0 5 5 0 4  Altered sleeping 0 2 2 - 2  Tired, decreased energy 2 2 3  - 3  Change in appetite 0 2 2 - 2  Feeling bad or failure about yourself  0 2 3 - 2  Trouble concentrating 0 2 2 - 3  Moving slowly or fidgety/restless 0 2 3 - 2  Suicidal thoughts 0 2 0 - 0  PHQ-9 Score 2 19 20  - 18  Difficult doing work/chores Not difficult at all - - - -    Relevant past medical, surgical, family and social history reviewed and updated as indicated. Interim medical history since our last visit reviewed. Allergies and medications reviewed and updated.  Review of Systems  Constitutional: Negative for activity change, fatigue and fever.  HENT: Negative for congestion, ear pain, rhinorrhea, sinus pressure, sinus pain and sore throat.   Eyes: Negative for pain, discharge and redness.  Respiratory: Negative for cough, chest tightness, shortness of breath and wheezing.   Cardiovascular: Negative for chest pain, palpitations and leg swelling.  Gastrointestinal: Negative for abdominal distention and abdominal pain.  Endocrine: Negative for cold intolerance, polydipsia, polyphagia and polyuria.  Genitourinary: Negative for difficulty urinating.  Musculoskeletal: Negative for back pain, myalgias and neck pain.  Skin: Negative.  Allergic/Immunologic: Negative.   Neurological: Negative for dizziness, syncope, numbness and headaches.  Hematological: Negative.   Psychiatric/Behavioral: Negative for behavioral problems.    Per HPI unless specifically indicated above     Objective:    BP 100/70 (BP Location: Left Arm, Patient Position: Sitting, Cuff Size: Normal)   Pulse 72   Temp 98.1 F (36.7 C)    Ht 5' 5.16" (1.655 m)   Wt 153 lb (69.4 kg)   SpO2 98%   BMI 25.34 kg/m   Wt Readings from Last 3 Encounters:  06/04/18 153 lb (69.4 kg)  04/03/18 152 lb (68.9 kg)  03/19/18 155 lb (70.3 kg)    Physical Exam  Constitutional: He is oriented to person, place, and time. He appears well-developed and well-nourished.  HENT:  Head: Normocephalic and atraumatic.  Right Ear: External ear normal.  Left Ear: External ear normal.  Nose: Nose normal.  Mouth/Throat: Oropharynx is clear and moist.  Eyes: Pupils are equal, round, and reactive to light. Conjunctivae and EOM are normal.  Neck: Normal range of motion. Neck supple. No JVD present. No thyromegaly present.  Cardiovascular: Normal rate, regular rhythm, normal heart sounds and intact distal pulses.  Pulmonary/Chest: Effort normal and breath sounds normal.  Abdominal: Soft. Bowel sounds are normal.  Musculoskeletal: Normal range of motion.  Lymphadenopathy:    He has no cervical adenopathy.  Neurological: He is alert and oriented to person, place, and time.  Skin: Skin is warm and dry.  Psychiatric: He has a normal mood and affect. His behavior is normal.    Results for orders placed or performed in visit on 03/19/18  Comp Met (CMET)  Result Value Ref Range   Glucose 91 65 - 99 mg/dL   BUN 7 6 - 24 mg/dL   Creatinine, Ser 1.17 0.76 - 1.27 mg/dL   GFR calc non Af Amer 69 >59 mL/min/1.73   GFR calc Af Amer 80 >59 mL/min/1.73   BUN/Creatinine Ratio 6 (L) 9 - 20   Sodium 139 134 - 144 mmol/L   Potassium 5.4 (H) 3.5 - 5.2 mmol/L   Chloride 102 96 - 106 mmol/L   CO2 24 20 - 29 mmol/L   Calcium 9.7 8.7 - 10.2 mg/dL   Total Protein 6.9 6.0 - 8.5 g/dL   Albumin 4.5 3.5 - 5.5 g/dL   Globulin, Total 2.4 1.5 - 4.5 g/dL   Albumin/Globulin Ratio 1.9 1.2 - 2.2   Bilirubin Total 0.3 0.0 - 1.2 mg/dL   Alkaline Phosphatase 126 (H) 39 - 117 IU/L   AST 23 0 - 40 IU/L   ALT 46 (H) 0 - 44 IU/L  Lipid Profile  Result Value Ref Range    Cholesterol, Total 165 100 - 199 mg/dL   Triglycerides 151 (H) 0 - 149 mg/dL   HDL 37 (L) >39 mg/dL   VLDL Cholesterol Cal 30 5 - 40 mg/dL   LDL Calculated 98 0 - 99 mg/dL   Chol/HDL Ratio 4.5 0.0 - 5.0 ratio      Assessment & Plan:   Problem List Items Addressed This Visit      Cardiovascular and Mediastinum   Hypertension    Chronic, ongoing.  Concern for hypotension with current BP reading.  Discussed risks with hypotension, such as falls, with patient.  Will d/c Lisinopril/HCTZ and initiate Lisinopril 5MG PO QDAY without diuretic.  Continue Amlodipine and Metoprolol.  Patient to return in 3 months for BP check and labs.  Wrote information and instructions out  for his mother to review.      Relevant Medications   lisinopril (PRINIVIL,ZESTRIL) 5 MG tablet   CVA (cerebral vascular accident) ([facility])    H/O x 2 CVA.  Continue ASA and to monitor lipid panel, initiate statin if LDL >100 for prevention.      Relevant Medications   lisinopril (PRINIVIL,ZESTRIL) 5 MG tablet     Other   Anxiety and depression    Chronic, stable.  PHQ9 = 2 today.  Continue Citalopram and monitor.       Other Visit Diagnoses    Annual physical exam    -  Primary   Relevant Orders   CBC w/Diff   TSH   HgB A1c   Comp Met (CMET)      Does not wish to get colonoscopy at this time.  Follow up plan: Return in about 3 months (around 09/04/2018) for HTN (BP check and possible labs).

## 2018-06-04 NOTE — Assessment & Plan Note (Signed)
Chronic, ongoing.  Concern for hypotension with current BP reading.  Discussed risks with hypotension, such as falls, with patient.  Will d/c Lisinopril/HCTZ and initiate Lisinopril 5MG  PO QDAY without diuretic.  Continue Amlodipine and Metoprolol.  Patient to return in 3 months for BP check and labs.  Wrote information and instructions out for his mother to review.

## 2018-06-04 NOTE — Assessment & Plan Note (Signed)
Chronic, stable.  PHQ9 = 2 today.  Continue Citalopram and monitor.

## 2018-06-05 LAB — CBC WITH DIFFERENTIAL/PLATELET
BASOS: 1 %
Basophils Absolute: 0.1 10*3/uL (ref 0.0–0.2)
EOS (ABSOLUTE): 0.3 10*3/uL (ref 0.0–0.4)
Eos: 3 %
Hematocrit: 43 % (ref 37.5–51.0)
Hemoglobin: 15 g/dL (ref 13.0–17.7)
IMMATURE GRANS (ABS): 0 10*3/uL (ref 0.0–0.1)
IMMATURE GRANULOCYTES: 1 %
LYMPHS ABS: 3.3 10*3/uL — AB (ref 0.7–3.1)
Lymphs: 37 %
MCH: 31.8 pg (ref 26.6–33.0)
MCHC: 34.9 g/dL (ref 31.5–35.7)
MCV: 91 fL (ref 79–97)
MONOS ABS: 0.8 10*3/uL (ref 0.1–0.9)
Monocytes: 9 %
Neutrophils Absolute: 4.4 10*3/uL (ref 1.4–7.0)
Neutrophils: 49 %
PLATELETS: 357 10*3/uL (ref 150–450)
RBC: 4.72 x10E6/uL (ref 4.14–5.80)
RDW: 12.2 % — AB (ref 12.3–15.4)
WBC: 8.9 10*3/uL (ref 3.4–10.8)

## 2018-06-05 LAB — COMPREHENSIVE METABOLIC PANEL
A/G RATIO: 1.7 (ref 1.2–2.2)
ALK PHOS: 106 IU/L (ref 39–117)
ALT: 67 IU/L — AB (ref 0–44)
AST: 19 IU/L (ref 0–40)
Albumin: 4.6 g/dL (ref 3.5–5.5)
BILIRUBIN TOTAL: 0.3 mg/dL (ref 0.0–1.2)
BUN/Creatinine Ratio: 22 — ABNORMAL HIGH (ref 9–20)
BUN: 22 mg/dL (ref 6–24)
CHLORIDE: 88 mmol/L — AB (ref 96–106)
CO2: 24 mmol/L (ref 20–29)
Calcium: 9.8 mg/dL (ref 8.7–10.2)
Creatinine, Ser: 1.02 mg/dL (ref 0.76–1.27)
GFR calc non Af Amer: 81 mL/min/{1.73_m2} (ref >59)
GFR, EST AFRICAN AMERICAN: 93 mL/min/{1.73_m2} (ref >59)
GLUCOSE: 84 mg/dL (ref 65–99)
Globulin, Total: 2.7 g/dL (ref 1.5–4.5)
POTASSIUM: 5.8 mmol/L — AB (ref 3.5–5.2)
Sodium: 130 mmol/L — ABNORMAL LOW (ref 134–144)
Total Protein: 7.3 g/dL (ref 6.0–8.5)

## 2018-06-05 LAB — TSH: TSH: 3.23 u[IU]/mL (ref 0.450–4.500)

## 2018-06-05 LAB — HEMOGLOBIN A1C
Est. average glucose Bld gHb Est-mCnc: 120 mg/dL
HEMOGLOBIN A1C: 5.8 % — AB (ref 4.8–5.6)

## 2018-06-06 ENCOUNTER — Ambulatory Visit: Payer: Medicare HMO | Admitting: Nurse Practitioner

## 2018-06-11 ENCOUNTER — Ambulatory Visit (INDEPENDENT_AMBULATORY_CARE_PROVIDER_SITE_OTHER): Payer: Medicare HMO | Admitting: Nurse Practitioner

## 2018-06-11 ENCOUNTER — Encounter: Payer: Self-pay | Admitting: Nurse Practitioner

## 2018-06-11 ENCOUNTER — Other Ambulatory Visit: Payer: Self-pay | Admitting: Physician Assistant

## 2018-06-11 VITALS — BP 113/79 | HR 69 | Temp 98.0°F | Wt 151.1 lb

## 2018-06-11 DIAGNOSIS — I10 Essential (primary) hypertension: Secondary | ICD-10-CM | POA: Diagnosis not present

## 2018-06-11 DIAGNOSIS — E871 Hypo-osmolality and hyponatremia: Secondary | ICD-10-CM | POA: Diagnosis not present

## 2018-06-11 DIAGNOSIS — E875 Hyperkalemia: Secondary | ICD-10-CM

## 2018-06-11 NOTE — Assessment & Plan Note (Addendum)
Chronic.  Discontinued Lisinopril-HCTZ last visit d/t labs.  Repeat BMP today.  BP below goal today.

## 2018-06-11 NOTE — Progress Notes (Signed)
BP 113/79 (BP Location: Left Arm, Patient Position: Sitting, Cuff Size: Normal)   Pulse 69   Temp 98 F (36.7 C)   Wt 151 lb 2 oz (68.5 kg)   SpO2 95%   BMI 25.03 kg/m    Subjective:    Patient ID: Mark [name], male    DOB: Apr 12, 1961, 57 y.o.   MRN: [id number]  HPI: Mark [name] is a 57 y.o. male presents for f/u HTN and hyperkalemia and hyponatremia  Chief Complaint  Patient presents with  . Hypertension   HYPERTENSION Had been placed on Lisinopril-HCTZ in August, this was discontinued at last visit due to labs noting hyperkalemia (5.8) and hyponatremia (130).  At baseline patient runs in upper limit of K+.  Continues on Amlodipine and Metoprolol.  His mother assists him with medication administration at home. Hypertension status: controlled  Satisfied with current treatment? yes Duration of hypertension: chronic BP monitoring frequency:  not checking BP range:  BP medication side effects:  no Medication compliance: good compliance Previous BP meds:lisinopril and HCTZ caused hyperkalemia and hyponatremia Aspirin: yes Recurrent headaches: no Visual changes: no Palpitations: no Dyspnea: no Chest pain: no Lower extremity edema: no Dizzy/lightheaded: no  Relevant past medical, surgical, family and social history reviewed and updated as indicated. Interim medical history since our last visit reviewed. Allergies and medications reviewed and updated.  Review of Systems  Constitutional: Negative for activity change, chills, diaphoresis, fatigue and fever.  HENT: Negative for tinnitus, trouble swallowing and voice change.   Eyes: Negative for pain and visual disturbance.  Respiratory: Negative for cough, chest tightness, shortness of breath and wheezing.   Cardiovascular: Negative for chest pain, palpitations and leg swelling.  Gastrointestinal: Negative for abdominal distention, abdominal pain, constipation, diarrhea, nausea and vomiting.  Endocrine: Negative.    Genitourinary: Negative.   Musculoskeletal: Negative.   Neurological: Negative for dizziness, tremors, syncope, speech difficulty, weakness, light-headedness, numbness and headaches.  Psychiatric/Behavioral: Negative.    Per HPI unless specifically indicated above     Objective:    BP 113/79 (BP Location: Left Arm, Patient Position: Sitting, Cuff Size: Normal)   Pulse 69   Temp 98 F (36.7 C)   Wt 151 lb 2 oz (68.5 kg)   SpO2 95%   BMI 25.03 kg/m   Wt Readings from Last 3 Encounters:  06/11/18 151 lb 2 oz (68.5 kg)  06/04/18 153 lb (69.4 kg)  04/03/18 152 lb (68.9 kg)    Physical Exam  Constitutional: He is oriented to person, place, and time. He appears well-developed and well-nourished.  HENT:  Head: Normocephalic and atraumatic.  Eyes: Pupils are equal, round, and reactive to light. EOM are normal.  Neck: Normal range of motion. Neck supple. No JVD present. Carotid bruit is not present.  Cardiovascular: Normal rate, regular rhythm and normal heart sounds.  Pulmonary/Chest: Effort normal and breath sounds normal.  Abdominal: Soft. Bowel sounds are normal. There is no splenomegaly or hepatomegaly.  Neurological: He is alert and oriented to person, place, and time.  Skin: Skin is warm and dry.  Psychiatric: He has a normal mood and affect. His behavior is normal. Thought content normal.    Results for orders placed or performed in visit on 06/04/18  CBC w/Diff  Result Value Ref Range   WBC 8.9 3.4 - 10.8 x10E3/uL   RBC 4.72 4.14 - 5.80 x10E6/uL   Hemoglobin 15.0 13.0 - 17.7 g/dL   Hematocrit 43.0 37.5 - 51.0 %   MCV 91  79 - 97 fL   MCH 31.8 26.6 - 33.0 pg   MCHC 34.9 31.5 - 35.7 g/dL   RDW 12.2 (L) 12.3 - 15.4 %   Platelets 357 150 - 450 x10E3/uL   Neutrophils 49 Not Estab. %   Lymphs 37 Not Estab. %   Monocytes 9 Not Estab. %   Eos 3 Not Estab. %   Basos 1 Not Estab. %   Neutrophils Absolute 4.4 1.4 - 7.0 x10E3/uL   Lymphocytes Absolute 3.3 (H) 0.7 - 3.1  x10E3/uL   Monocytes Absolute 0.8 0.1 - 0.9 x10E3/uL   EOS (ABSOLUTE) 0.3 0.0 - 0.4 x10E3/uL   Basophils Absolute 0.1 0.0 - 0.2 x10E3/uL   Immature Granulocytes 1 Not Estab. %   Immature Grans (Abs) 0.0 0.0 - 0.1 x10E3/uL  TSH  Result Value Ref Range   TSH 3.230 0.450 - 4.500 uIU/mL  HgB A1c  Result Value Ref Range   Hgb A1c MFr Bld 5.8 (H) 4.8 - 5.6 %   Est. average glucose Bld gHb Est-mCnc 120 mg/dL  Comp Met (CMET)  Result Value Ref Range   Glucose 84 65 - 99 mg/dL   BUN 22 6 - 24 mg/dL   Creatinine, Ser 1.02 0.76 - 1.27 mg/dL   GFR calc non Af Amer 81 >59 mL/min/1.73   GFR calc Af Amer 93 >59 mL/min/1.73   BUN/Creatinine Ratio 22 (H) 9 - 20   Sodium 130 (L) 134 - 144 mmol/L   Potassium 5.8 (H) 3.5 - 5.2 mmol/L   Chloride 88 (L) 96 - 106 mmol/L   CO2 24 20 - 29 mmol/L   Calcium 9.8 8.7 - 10.2 mg/dL   Total Protein 7.3 6.0 - 8.5 g/dL   Albumin 4.6 3.5 - 5.5 g/dL   Globulin, Total 2.7 1.5 - 4.5 g/dL   Albumin/Globulin Ratio 1.7 1.2 - 2.2   Bilirubin Total 0.3 0.0 - 1.2 mg/dL   Alkaline Phosphatase 106 39 - 117 IU/L   AST 19 0 - 40 IU/L   ALT 67 (H) 0 - 44 IU/L      Assessment & Plan:   Problem List Items Addressed This Visit      Cardiovascular and Mediastinum   Hypertension    Chronic.  Discontinued Lisinopril-HCTZ last visit d/t labs.  Repeat BMP today.  BP below goal today.      Relevant Orders   Basic Metabolic Panel (BMET)     Other   Hyperkalemia    Acute, noted on recent labs 5.8.  Runs at high end of normal at baseline.  Lisinopril-HCTZ d/ced at last visit.  BMP today.      Hyponatremia - Primary    Noted on recent labs, 130.  Lisinopril-HCTZ discontinued at last visit.  BMP today.          Follow up plan: Return if symptoms worsen or fail to improve, for has f/u in two months.

## 2018-06-11 NOTE — Assessment & Plan Note (Signed)
Acute, noted on recent labs 5.8.  Runs at high end of normal at baseline.  Lisinopril-HCTZ d/ced at last visit.  BMP today.

## 2018-06-11 NOTE — Telephone Encounter (Signed)
Refills approved.

## 2018-06-11 NOTE — Patient Instructions (Signed)

## 2018-06-11 NOTE — Assessment & Plan Note (Signed)
Noted on recent labs, 130.  Lisinopril-HCTZ discontinued at last visit.  BMP today.

## 2018-06-12 LAB — BASIC METABOLIC PANEL
BUN/Creatinine Ratio: 10 (ref 9–20)
BUN: 12 mg/dL (ref 6–24)
CALCIUM: 10 mg/dL (ref 8.7–10.2)
CHLORIDE: 100 mmol/L (ref 96–106)
CO2: 23 mmol/L (ref 20–29)
Creatinine, Ser: 1.19 mg/dL (ref 0.76–1.27)
GFR calc Af Amer: 77 mL/min/{1.73_m2} (ref >59)
GFR, EST NON AFRICAN AMERICAN: 67 mL/min/{1.73_m2} (ref >59)
GLUCOSE: 95 mg/dL (ref 65–99)
POTASSIUM: 4.8 mmol/L (ref 3.5–5.2)
SODIUM: 136 mmol/L (ref 134–144)

## 2018-08-19 ENCOUNTER — Telehealth: Payer: Self-pay | Admitting: Nurse Practitioner

## 2018-08-19 ENCOUNTER — Other Ambulatory Visit: Payer: Self-pay | Admitting: Nurse Practitioner

## 2018-08-19 DIAGNOSIS — I6389 Other cerebral infarction: Secondary | ICD-10-CM

## 2018-08-19 NOTE — Telephone Encounter (Signed)
Pt stated that since he has lost his license in order to get them back he has to be referred to [facility]. He stated that since he has had 2 strokes that this is part of the requirements of getting his license back.  Call back at [phone].

## 2018-08-19 NOTE — Telephone Encounter (Signed)
Please let him know referrals have been sent.

## 2018-08-19 NOTE — Telephone Encounter (Signed)
Pt aware.

## 2018-08-19 NOTE — Progress Notes (Signed)
Patient wishing to obtain license for driving.  Has history of strokes.  Has been advised to obtain license requires referral to [facility] for further evaluation and therapy.   Has requested referral.  Order written.

## 2018-08-22 ENCOUNTER — Telehealth: Payer: Self-pay | Admitting: Nurse Practitioner

## 2018-08-22 NOTE — Telephone Encounter (Signed)
Copied from CRM [id number]. Topic: General - Other >> Aug 22, 2018  3:53 PM Tamela Oddi wrote: Reason for CRM: Patient called to check the status of his referral request.  Please call patient as soon as possible at [phone]

## 2018-09-03 ENCOUNTER — Encounter: Payer: Self-pay | Admitting: Nurse Practitioner

## 2018-09-03 DIAGNOSIS — R7309 Other abnormal glucose: Secondary | ICD-10-CM | POA: Insufficient documentation

## 2018-09-03 DIAGNOSIS — R7303 Prediabetes: Secondary | ICD-10-CM | POA: Insufficient documentation

## 2018-09-04 ENCOUNTER — Encounter: Payer: Self-pay | Admitting: Nurse Practitioner

## 2018-09-04 ENCOUNTER — Other Ambulatory Visit: Payer: Self-pay

## 2018-09-04 ENCOUNTER — Ambulatory Visit (INDEPENDENT_AMBULATORY_CARE_PROVIDER_SITE_OTHER): Payer: Medicare HMO | Admitting: Nurse Practitioner

## 2018-09-04 VITALS — BP 115/81 | HR 69 | Temp 98.3°F | Ht 65.0 in | Wt 155.0 lb

## 2018-09-04 DIAGNOSIS — Z13220 Encounter for screening for lipoid disorders: Secondary | ICD-10-CM | POA: Diagnosis not present

## 2018-09-04 DIAGNOSIS — I10 Essential (primary) hypertension: Secondary | ICD-10-CM

## 2018-09-04 NOTE — Assessment & Plan Note (Signed)
Chronic, ongoing with BP below goal today.  Continue current med regimen.  CMP today.

## 2018-09-04 NOTE — Progress Notes (Signed)
BP 115/81   Pulse 69   Temp 98.3 F (36.8 C) (Oral)   Ht 5\' 5"  (1.651 m)   Wt 155 lb (70.3 kg)   SpO2 95%   BMI 25.79 kg/m    Subjective:    Patient ID: Mark [name], male    DOB: 1961-07-18, 58 y.o.   MRN: [id number]  HPI: Mark [name] is a 58 y.o. male  Chief Complaint  Patient presents with  . Hypertension    f/u   HYPERTENSION Hypertension status: controlled  Satisfied with current treatment? yes Duration of hypertension: chronic BP monitoring frequency:  not checking BP range:  BP medication side effects:  no Medication compliance: good compliance Aspirin: yes Recurrent headaches: no Visual changes: no Palpitations: no Dyspnea: no Chest pain: no Lower extremity edema: no Dizzy/lightheaded: no  Relevant past medical, surgical, family and social history reviewed and updated as indicated. Interim medical history since our last visit reviewed. Allergies and medications reviewed and updated.  Review of Systems  Constitutional: Negative for activity change, diaphoresis, fatigue and fever.  Respiratory: Negative for cough, chest tightness, shortness of breath and wheezing.   Cardiovascular: Negative for chest pain, palpitations and leg swelling.  Gastrointestinal: Negative for abdominal distention, abdominal pain, constipation, diarrhea, nausea and vomiting.  Endocrine: Negative for cold intolerance, heat intolerance, polydipsia, polyphagia and polyuria.  Musculoskeletal: Negative.   Skin: Negative.   Neurological: Negative for dizziness, syncope, weakness, light-headedness, numbness and headaches.  Psychiatric/Behavioral: Negative.     Per HPI unless specifically indicated above     Objective:    BP 115/81   Pulse 69   Temp 98.3 F (36.8 C) (Oral)   Ht 5\' 5"  (1.651 m)   Wt 155 lb (70.3 kg)   SpO2 95%   BMI 25.79 kg/m   Wt Readings from Last 3 Encounters:  09/04/18 155 lb (70.3 kg)  06/11/18 151 lb 2 oz (68.5 kg)  06/04/18 153 lb (69.4  kg)    Physical Exam Vitals signs and nursing note reviewed.  Constitutional:      Appearance: He is well-developed.  HENT:     Head: Normocephalic and atraumatic.     Right Ear: Hearing normal. No drainage.     Left Ear: Hearing normal. No drainage.     Mouth/Throat:     Pharynx: Uvula midline.  Eyes:     General: Lids are normal.        Right eye: No discharge.        Left eye: No discharge.     Conjunctiva/sclera: Conjunctivae normal.     Pupils: Pupils are equal, round, and reactive to light.  Neck:     Musculoskeletal: Normal range of motion and neck supple.     Thyroid: No thyromegaly.     Vascular: No carotid bruit or JVD.     Trachea: Trachea normal.  Cardiovascular:     Rate and Rhythm: Normal rate and regular rhythm.     Heart sounds: Normal heart sounds, S1 normal and S2 normal. No murmur. No gallop.   Pulmonary:     Effort: Pulmonary effort is normal.     Breath sounds: Examination of the right-upper field reveals wheezing. Examination of the left-upper field reveals wheezing. Wheezing present.     Comments: Occasional expiratory wheezes with diminished bases. Abdominal:     General: Bowel sounds are normal.     Palpations: Abdomen is soft. There is no hepatomegaly or splenomegaly.  Musculoskeletal: Normal range of motion.  Skin:    General: Skin is warm and dry.     Capillary Refill: Capillary refill takes less than 2 seconds.     Findings: No rash.  Neurological:     Mental Status: He is alert and oriented to person, place, and time.     Deep Tendon Reflexes: Reflexes are normal and symmetric.  Psychiatric:        Mood and Affect: Mood normal.        Behavior: Behavior normal.        Thought Content: Thought content normal.        Judgment: Judgment normal.     Comments: Previous visits has had flatter affect, today was smiling and more interactive.     Results for orders placed or performed in visit on 06/11/18  Basic Metabolic Panel (BMET)  Result  Value Ref Range   Glucose 95 65 - 99 mg/dL   BUN 12 6 - 24 mg/dL   Creatinine, Ser 2.09 0.76 - 1.27 mg/dL   GFR calc non Af Amer 67 >59 mL/min/1.73   GFR calc Af Amer 77 >59 mL/min/1.73   BUN/Creatinine Ratio 10 9 - 20   Sodium 136 134 - 144 mmol/L   Potassium 4.8 3.5 - 5.2 mmol/L   Chloride 100 96 - 106 mmol/L   CO2 23 20 - 29 mmol/L   Calcium 10.0 8.7 - 10.2 mg/dL      Assessment & Plan:   Problem List Items Addressed This Visit      Cardiovascular and Mediastinum   Hypertension - Primary    Chronic, ongoing with BP below goal today.  Continue current med regimen.  CMP today.      Relevant Orders   Comprehensive metabolic panel    Other Visit Diagnoses    Screening cholesterol level       Lipid panel today.  With h/o stroke may benefit from statin.   Relevant Orders   Lipid Panel w/o Chol/HDL Ratio       Follow up plan: Return in about 6 months (around 03/05/2019) for HTN.

## 2018-09-04 NOTE — Patient Instructions (Signed)

## 2018-09-05 ENCOUNTER — Encounter: Payer: Self-pay | Admitting: Nurse Practitioner

## 2018-09-05 ENCOUNTER — Other Ambulatory Visit: Payer: Self-pay | Admitting: Nurse Practitioner

## 2018-09-05 DIAGNOSIS — E785 Hyperlipidemia, unspecified: Secondary | ICD-10-CM | POA: Insufficient documentation

## 2018-09-05 LAB — COMPREHENSIVE METABOLIC PANEL
ALK PHOS: 122 IU/L — AB (ref 39–117)
ALT: 52 IU/L — AB (ref 0–44)
AST: 27 IU/L (ref 0–40)
Albumin/Globulin Ratio: 1.7 (ref 1.2–2.2)
Albumin: 4.5 g/dL (ref 3.8–4.9)
BILIRUBIN TOTAL: 0.3 mg/dL (ref 0.0–1.2)
BUN/Creatinine Ratio: 7 — ABNORMAL LOW (ref 9–20)
BUN: 8 mg/dL (ref 6–24)
CHLORIDE: 101 mmol/L (ref 96–106)
CO2: 24 mmol/L (ref 20–29)
Calcium: 10.1 mg/dL (ref 8.7–10.2)
Creatinine, Ser: 1.2 mg/dL (ref 0.76–1.27)
GFR calc Af Amer: 77 mL/min/{1.73_m2} (ref >59)
GFR calc non Af Amer: 66 mL/min/{1.73_m2} (ref >59)
GLUCOSE: 93 mg/dL (ref 65–99)
Globulin, Total: 2.6 g/dL (ref 1.5–4.5)
Potassium: 5.3 mmol/L — ABNORMAL HIGH (ref 3.5–5.2)
Sodium: 140 mmol/L (ref 134–144)
Total Protein: 7.1 g/dL (ref 6.0–8.5)

## 2018-09-05 LAB — LIPID PANEL W/O CHOL/HDL RATIO
Cholesterol, Total: 189 mg/dL (ref 100–199)
HDL: 45 mg/dL (ref >39)
LDL CALC: 118 mg/dL — AB (ref 0–99)
TRIGLYCERIDES: 128 mg/dL (ref 0–149)
VLDL Cholesterol Cal: 26 mg/dL (ref 5–40)

## 2018-09-05 MED ORDER — PRAVASTATIN SODIUM 20 MG PO TABS: 20.0000 mg | ORAL_TABLET | Freq: Every day | ORAL | 3 refills | Status: DC

## 2018-09-05 NOTE — Progress Notes (Signed)
Pravastatin prescription sent to pharmacy due to elevated LDL and stroke risk.  Will have patient come back to clinic in one month to check LFT and lipid panel.

## 2018-09-11 ENCOUNTER — Other Ambulatory Visit: Payer: Self-pay | Admitting: Nurse Practitioner

## 2018-09-11 DIAGNOSIS — I10 Essential (primary) hypertension: Secondary | ICD-10-CM

## 2018-09-11 NOTE — Telephone Encounter (Signed)
Requested Prescriptions  Pending Prescriptions Disp Refills  . metoprolol tartrate (LOPRESSOR) 25 MG tablet [Pharmacy Med Name: METOPROLOL TARTRATE 25 MG TAB] 180 tablet 0    Sig: TAKE 1 TABLET BY MOUTH TWICE A DAY     Cardiovascular:  Beta Blockers Passed - 09/11/2018 10:07 AM      Passed - Last BP in normal range    BP Readings from Last 1 Encounters:  09/04/18 115/81         Passed - Last Heart Rate in normal range    Pulse Readings from Last 1 Encounters:  09/04/18 69         Passed - Valid encounter within last 6 months    Recent Outpatient Visits          1 week ago Essential hypertension   [facility] [place], Hunter T, NP   3 months ago Hyponatremia   [facility] [place], [place] T, NP   3 months ago Annual physical exam   [facility] [place], Dorie Rank, NP   5 months ago Essential hypertension   [facility] [place], Adriana M, PA-C   5 months ago Essential hypertension   [facility] [place], Lavella Hammock, PA-C      Future Appointments            In 3 weeks Cannady, Dorie Rank, NP [facility]   In 5 months Cannady, Marvin T, NP [facility]         . amLODipine (NORVASC) 10 MG tablet [facility] Med Name: AMLODIPINE BESYLATE 10 MG TAB] 90 tablet 0    Sig: TAKE 1 TABLET BY MOUTH EVERY DAY     Cardiovascular:  Calcium Channel Blockers Passed - 09/11/2018 10:07 AM      Passed - Last BP in normal range    BP Readings from Last 1 Encounters:  09/04/18 115/81         Passed - Valid encounter within last 6 months    Recent Outpatient Visits          1 week ago Essential hypertension   [facility] [place], Pace T, NP   3 months ago Hyponatremia   [facility] [place], [place] T, NP   3 months ago Annual physical exam   [facility] [place], Dorie Rank, NP   5 months ago Essential hypertension   [facility] [place], Adriana  M, PA-C   5 months ago Essential hypertension   [facility] [place], Lavella Hammock, PA-C      Future Appointments            In 3 weeks Cannady, Dorie Rank, NP [facility]   In 5 months [place], Dorie Rank, NP [facility]

## 2018-10-03 ENCOUNTER — Telehealth: Payer: Self-pay | Admitting: Nurse Practitioner

## 2018-10-03 NOTE — Telephone Encounter (Signed)
Called to schedule Medicare Annual Wellness Visit with the Nurse Health Advisor.  Patient was offered a same day appointment with his 10/07/2018 Office Visit, but declined.  Patient also declined scheduling AWV-s on another dates.  He states will discuss this with the provider at his 10/07/2018 Office Visit.  If patient returns call, please note: their last AWV was on 05/30/2017,  please schedule AWV with [facility] any date AFTER 05/30/2018.  Thank you! For any questions please contact: Trixie Rude at [phone] or Skype [email or web address]

## 2018-10-07 ENCOUNTER — Ambulatory Visit (INDEPENDENT_AMBULATORY_CARE_PROVIDER_SITE_OTHER): Payer: Medicare HMO | Admitting: Nurse Practitioner

## 2018-10-07 ENCOUNTER — Encounter: Payer: Self-pay | Admitting: Nurse Practitioner

## 2018-10-07 ENCOUNTER — Other Ambulatory Visit: Payer: Self-pay

## 2018-10-07 VITALS — BP 126/78 | HR 52 | Temp 98.2°F | Ht 65.0 in | Wt 157.0 lb

## 2018-10-07 DIAGNOSIS — E785 Hyperlipidemia, unspecified: Secondary | ICD-10-CM

## 2018-10-07 DIAGNOSIS — I10 Essential (primary) hypertension: Secondary | ICD-10-CM

## 2018-10-07 NOTE — Patient Instructions (Signed)
DASH Eating Plan  DASH stands for "Dietary Approaches to Stop Hypertension." The DASH eating plan is a healthy eating plan that has been shown to reduce high blood pressure (hypertension). It may also reduce your risk for type 2 diabetes, heart disease, and stroke. The DASH eating plan may also help with weight loss.  What are tips for following this plan?    General guidelines   Avoid eating more than 2,300 mg (milligrams) of salt (sodium) a day. If you have hypertension, you may need to reduce your sodium intake to 1,500 mg a day.   Limit alcohol intake to no more than 1 drink a day for nonpregnant women and 2 drinks a day for men. One drink equals 12 oz of beer, 5 oz of wine, or 1 oz of hard liquor.   Work with your health care provider to maintain a healthy body weight or to lose weight. Ask what an ideal weight is for you.   Get at least 30 minutes of exercise that causes your heart to beat faster (aerobic exercise) most days of the week. Activities may include walking, swimming, or biking.   Work with your health care provider or diet and nutrition specialist (dietitian) to adjust your eating plan to your individual calorie needs.  Reading food labels     Check food labels for the amount of sodium per serving. Choose foods with less than 5 percent of the Daily Value of sodium. Generally, foods with less than 300 mg of sodium per serving fit into this eating plan.   To find whole grains, look for the word "whole" as the first word in the ingredient list.  Shopping   Buy products labeled as "low-sodium" or "no salt added."   Buy fresh foods. Avoid canned foods and premade or frozen meals.  Cooking   Avoid adding salt when cooking. Use salt-free seasonings or herbs instead of table salt or sea salt. Check with your health care provider or pharmacist before using salt substitutes.   Do not fry foods. Cook foods using healthy methods such as baking, boiling, grilling, and broiling instead.   Cook with  heart-healthy oils, such as olive, canola, soybean, or sunflower oil.  Meal planning   Eat a balanced diet that includes:  ? 5 or more servings of fruits and vegetables each day. At each meal, try to fill half of your plate with fruits and vegetables.  ? Up to 6-8 servings of whole grains each day.  ? Less than 6 oz of lean meat, poultry, or fish each day. A 3-oz serving of meat is about the same size as a deck of cards. One egg equals 1 oz.  ? 2 servings of low-fat dairy each day.  ? A serving of nuts, seeds, or beans 5 times each week.  ? Heart-healthy fats. Healthy fats called Omega-3 fatty acids are found in foods such as flaxseeds and coldwater fish, like sardines, salmon, and mackerel.   Limit how much you eat of the following:  ? Canned or prepackaged foods.  ? Food that is high in trans fat, such as fried foods.  ? Food that is high in saturated fat, such as fatty meat.  ? Sweets, desserts, sugary drinks, and other foods with added sugar.  ? Full-fat dairy products.   Do not salt foods before eating.   Try to eat at least 2 vegetarian meals each week.   Eat more home-cooked food and less restaurant, buffet, and fast food.     When eating at a restaurant, ask that your food be prepared with less salt or no salt, if possible.  What foods are recommended?  The items listed may not be a complete list. Talk with your dietitian about what dietary choices are best for you.  Grains  Whole-grain or whole-wheat bread. Whole-grain or whole-wheat pasta. Brown rice. Oatmeal. Quinoa. Bulgur. Whole-grain and low-sodium cereals. Pita bread. Low-fat, low-sodium crackers. Whole-wheat flour tortillas.  Vegetables  Fresh or frozen vegetables (raw, steamed, roasted, or grilled). Low-sodium or reduced-sodium tomato and vegetable juice. Low-sodium or reduced-sodium tomato sauce and tomato paste. Low-sodium or reduced-sodium canned vegetables.  Fruits  All fresh, dried, or frozen fruit. Canned fruit in natural juice (without  added sugar).  Meat and other protein foods  Skinless chicken or turkey. Ground chicken or turkey. Pork with fat trimmed off. Fish and seafood. Egg whites. Dried beans, peas, or lentils. Unsalted nuts, nut butters, and seeds. Unsalted canned beans. Lean cuts of beef with fat trimmed off. Low-sodium, lean deli meat.  Dairy  Low-fat (1%) or fat-free (skim) milk. Fat-free, low-fat, or reduced-fat cheeses. Nonfat, low-sodium ricotta or cottage cheese. Low-fat or nonfat yogurt. Low-fat, low-sodium cheese.  Fats and oils  Soft margarine without trans fats. Vegetable oil. Low-fat, reduced-fat, or light mayonnaise and salad dressings (reduced-sodium). Canola, safflower, olive, soybean, and sunflower oils. Avocado.  Seasoning and other foods  Herbs. Spices. Seasoning mixes without salt. Unsalted popcorn and pretzels. Fat-free sweets.  What foods are not recommended?  The items listed may not be a complete list. Talk with your dietitian about what dietary choices are best for you.  Grains  Baked goods made with fat, such as croissants, muffins, or some breads. Dry pasta or rice meal packs.  Vegetables  Creamed or fried vegetables. Vegetables in a cheese sauce. Regular canned vegetables (not low-sodium or reduced-sodium). Regular canned tomato sauce and paste (not low-sodium or reduced-sodium). Regular tomato and vegetable juice (not low-sodium or reduced-sodium). Pickles. Olives.  Fruits  Canned fruit in a light or heavy syrup. Fried fruit. Fruit in cream or butter sauce.  Meat and other protein foods  Fatty cuts of meat. Ribs. Fried meat. Bacon. Sausage. Bologna and other processed lunch meats. Salami. Fatback. Hotdogs. Bratwurst. Salted nuts and seeds. Canned beans with added salt. Canned or smoked fish. Whole eggs or egg yolks. Chicken or turkey with skin.  Dairy  Whole or 2% milk, cream, and half-and-half. Whole or full-fat cream cheese. Whole-fat or sweetened yogurt. Full-fat cheese. Nondairy creamers. Whipped toppings.  Processed cheese and cheese spreads.  Fats and oils  Butter. Stick margarine. Lard. Shortening. Ghee. Bacon fat. Tropical oils, such as coconut, palm kernel, or palm oil.  Seasoning and other foods  Salted popcorn and pretzels. Onion salt, garlic salt, seasoned salt, table salt, and sea salt. Worcestershire sauce. Tartar sauce. Barbecue sauce. Teriyaki sauce. Soy sauce, including reduced-sodium. Steak sauce. Canned and packaged gravies. Fish sauce. Oyster sauce. Cocktail sauce. Horseradish that you find on the shelf. Ketchup. Mustard. Meat flavorings and tenderizers. Bouillon cubes. Hot sauce and Tabasco sauce. Premade or packaged marinades. Premade or packaged taco seasonings. Relishes. Regular salad dressings.  Where to find more information:   National Heart, Lung, and Blood Institute: www.nhlbi.nih.gov   American Heart Association: www.heart.org  Summary   The DASH eating plan is a healthy eating plan that has been shown to reduce high blood pressure (hypertension). It may also reduce your risk for type 2 diabetes, heart disease, and stroke.   With the   DASH eating plan, you should limit salt (sodium) intake to 2,300 mg a day. If you have hypertension, you may need to reduce your sodium intake to 1,500 mg a day.   When on the DASH eating plan, aim to eat more fresh fruits and vegetables, whole grains, lean proteins, low-fat dairy, and heart-healthy fats.   Work with your health care provider or diet and nutrition specialist (dietitian) to adjust your eating plan to your individual calorie needs.  This information is not intended to replace advice given to you by your health care provider. Make sure you discuss any questions you have with your health care provider.  Document Released: 07/20/2011 Document Revised: 07/24/2016 Document Reviewed: 07/24/2016  Elsevier Interactive Patient Education  2019 Elsevier Inc.

## 2018-10-07 NOTE — Assessment & Plan Note (Signed)
Chronic, stable. Labs drawn today. Continue current treatment. Follow-up in 6 months or sooner if needed.

## 2018-10-07 NOTE — Assessment & Plan Note (Addendum)
Labs drawn today. Will adjust treatment as needed based on results. Continue current treatment. Follow-up in 6 months or sooner if needed.

## 2018-10-07 NOTE — Progress Notes (Signed)
BP 126/78   Pulse (!) 52   Temp 98.2 F (36.8 C) (Oral)   Ht 5' 5"  (1.651 m)   Wt 157 lb (71.2 kg)   SpO2 98%   BMI 26.13 kg/m    Subjective:    Patient ID: Mark [name], male    DOB: [birth year], 59 y.o.   MRN: [id number]  HPI: Mark [name] is a 58 y.o. male  Chief Complaint  Patient presents with  . Hyperlipidemia    f/u   HYPERTENSION / HYPERLIPIDEMIA Taking amlodipine, lisinopril, and metoprolol daily for BP. Taking pravastatin daily for HLD. Pt reports increased fatigue since starting pravastatin. Discussed with patient the option of taking this medication in the evening if symptoms persist. He stated he prefers to take it in the morning with his other medications at this time so he does not forget.  Satisfied with current treatment? yes Duration of hypertension: chronic BP monitoring frequency: not checking BP medication side effects: no Duration of hyperlipidemia: months Cholesterol medication side effects: yes Cholesterol supplements: none Aspirin: yes Recent stressors: Recently lost transportation- unable to drive d/t CVA Hx, was reliant on mother for transportation but she has quit driving Recurrent headaches: no Visual changes: no Palpitations: no Dyspnea: no Chest pain: no Lower extremity edema: no Dizzy/lightheaded: no    Relevant past medical, surgical, family and social history reviewed and updated as indicated. Interim medical history since our last visit reviewed. Allergies and medications reviewed and updated.  Review of Systems  Per HPI unless specifically indicated above     Objective:    BP 126/78   Pulse (!) 52   Temp 98.2 F (36.8 C) (Oral)   Ht 5' 5"  (1.651 m)   Wt 157 lb (71.2 kg)   SpO2 98%   BMI 26.13 kg/m   Wt Readings from Last 3 Encounters:  10/07/18 157 lb (71.2 kg)  09/04/18 155 lb (70.3 kg)  06/11/18 151 lb 2 oz (68.5 kg)    Physical Exam Vitals signs and nursing note reviewed.  Constitutional:    Appearance: He is well-developed.  HENT:     Head: Normocephalic and atraumatic.     Right Ear: Hearing normal. No drainage.     Left Ear: Hearing normal. No drainage.     Mouth/Throat:     Pharynx: Uvula midline.  Eyes:     General: Lids are normal.        Right eye: No discharge.        Left eye: No discharge.     Conjunctiva/sclera: Conjunctivae normal.     Pupils: Pupils are equal, round, and reactive to light.  Neck:     Musculoskeletal: Normal range of motion and neck supple.     Thyroid: No thyromegaly.     Vascular: No carotid bruit or JVD.     Trachea: Trachea normal.  Cardiovascular:     Rate and Rhythm: Normal rate and regular rhythm.     Heart sounds: Normal heart sounds, S1 normal and S2 normal. No murmur. No gallop.   Pulmonary:     Effort: Pulmonary effort is normal.     Breath sounds: Normal breath sounds.  Abdominal:     General: Bowel sounds are normal.     Palpations: Abdomen is soft. There is no hepatomegaly or splenomegaly.  Musculoskeletal: Normal range of motion.     Right lower leg: No edema.     Left lower leg: No edema.  Skin:    General: Skin  is warm and dry.     Capillary Refill: Capillary refill takes less than 2 seconds.     Findings: No rash.  Neurological:     Mental Status: He is alert and oriented to person, place, and time.     Deep Tendon Reflexes: Reflexes are normal and symmetric.  Psychiatric:        Mood and Affect: Mood normal.        Behavior: Behavior normal.        Thought Content: Thought content normal.        Judgment: Judgment normal.     Results for orders placed or performed in visit on 09/04/18  Lipid Panel w/o Chol/HDL Ratio  Result Value Ref Range   Cholesterol, Total 189 100 - 199 mg/dL   Triglycerides 128 0 - 149 mg/dL   HDL 45 >39 mg/dL   VLDL Cholesterol Cal 26 5 - 40 mg/dL   LDL Calculated 118 (H) 0 - 99 mg/dL  Comprehensive metabolic panel  Result Value Ref Range   Glucose 93 65 - 99 mg/dL   BUN 8 6 -  24 mg/dL   Creatinine, Ser 1.20 0.76 - 1.27 mg/dL   GFR calc non Af Amer 66 >59 mL/min/1.73   GFR calc Af Amer 77 >59 mL/min/1.73   BUN/Creatinine Ratio 7 (L) 9 - 20   Sodium 140 134 - 144 mmol/L   Potassium 5.3 (H) 3.5 - 5.2 mmol/L   Chloride 101 96 - 106 mmol/L   CO2 24 20 - 29 mmol/L   Calcium 10.1 8.7 - 10.2 mg/dL   Total Protein 7.1 6.0 - 8.5 g/dL   Albumin 4.5 3.8 - 4.9 g/dL   Globulin, Total 2.6 1.5 - 4.5 g/dL   Albumin/Globulin Ratio 1.7 1.2 - 2.2   Bilirubin Total 0.3 0.0 - 1.2 mg/dL   Alkaline Phosphatase 122 (H) 39 - 117 IU/L   AST 27 0 - 40 IU/L   ALT 52 (H) 0 - 44 IU/L      Assessment & Plan:   Problem List Items Addressed This Visit      Cardiovascular and Mediastinum   Hypertension    Chronic, stable. Labs drawn today. Continue current treatment. Follow-up in 6 months or sooner if needed.       Relevant Medications   lisinopril (PRINIVIL,ZESTRIL) 5 MG tablet     Other   Hyperlipidemia LDL goal <70 - Primary    Labs drawn today. Will adjust treatment as needed based on results. Continue current treatment. Follow-up in 6 months or sooner if needed.       Relevant Medications   lisinopril (PRINIVIL,ZESTRIL) 5 MG tablet   Other Relevant Orders   Lipid Panel w/o Chol/HDL Ratio   Comp Met (CMET)       Follow up plan: Return in about 6 months (around 04/07/2019) for HTN/HLD.   NOTE WRITTEN BY [employer] DNP STUDENT.  ASSESSMENT AND PLAN OF CARE REVIEWED WITH STUDENT, AGREE WITH ABOVE FINDINGS AND PLAN.

## 2018-10-08 ENCOUNTER — Telehealth: Payer: Self-pay | Admitting: Nurse Practitioner

## 2018-10-08 DIAGNOSIS — E875 Hyperkalemia: Secondary | ICD-10-CM

## 2018-10-08 LAB — COMPREHENSIVE METABOLIC PANEL
ALK PHOS: 143 IU/L — AB (ref 39–117)
ALT: 62 IU/L — ABNORMAL HIGH (ref 0–44)
AST: 24 IU/L (ref 0–40)
Albumin/Globulin Ratio: 1.8 (ref 1.2–2.2)
Albumin: 4.7 g/dL (ref 3.8–4.9)
BUN/Creatinine Ratio: 7 — ABNORMAL LOW (ref 9–20)
BUN: 8 mg/dL (ref 6–24)
Bilirubin Total: <0.2 mg/dL (ref 0.0–1.2)
CO2: 23 mmol/L (ref 20–29)
CREATININE: 1.16 mg/dL (ref 0.76–1.27)
Calcium: 10.2 mg/dL (ref 8.7–10.2)
Chloride: 101 mmol/L (ref 96–106)
GFR calc Af Amer: 80 mL/min/{1.73_m2} (ref >59)
GFR calc non Af Amer: 69 mL/min/{1.73_m2} (ref >59)
Globulin, Total: 2.6 g/dL (ref 1.5–4.5)
Glucose: 94 mg/dL (ref 65–99)
Potassium: 5.7 mmol/L — ABNORMAL HIGH (ref 3.5–5.2)
Sodium: 140 mmol/L (ref 134–144)
Total Protein: 7.3 g/dL (ref 6.0–8.5)

## 2018-10-08 LAB — LIPID PANEL W/O CHOL/HDL RATIO
Cholesterol, Total: 157 mg/dL (ref 100–199)
HDL: 50 mg/dL (ref >39)
LDL Calculated: 89 mg/dL (ref 0–99)
Triglycerides: 89 mg/dL (ref 0–149)
VLDL Cholesterol Cal: 18 mg/dL (ref 5–40)

## 2018-10-08 NOTE — Telephone Encounter (Signed)
Spoke to [place] on the telephone and discussed lab results.  Recommended he stop taking Lisinopril due to elevation in potassium and hold any multivitamins.  Told him will order lab draw only for one week and would like him to come to office for lab draw.  He was able to verbalize plan of care.

## 2018-10-08 NOTE — Assessment & Plan Note (Signed)
Stop Lisinopril and recheck labs in one week.

## 2018-11-11 ENCOUNTER — Other Ambulatory Visit: Payer: Self-pay | Admitting: *Deleted

## 2018-11-11 ENCOUNTER — Other Ambulatory Visit: Payer: Self-pay | Admitting: Unknown Physician Specialty

## 2018-11-11 MED ORDER — CITALOPRAM HYDROBROMIDE 20 MG PO TABS: 20.0000 mg | ORAL_TABLET | Freq: Every day | ORAL | 1 refills | Status: DC

## 2018-12-20 ENCOUNTER — Other Ambulatory Visit: Payer: Self-pay | Admitting: Nurse Practitioner

## 2018-12-20 DIAGNOSIS — I10 Essential (primary) hypertension: Secondary | ICD-10-CM

## 2018-12-20 NOTE — Telephone Encounter (Signed)
Requested Prescriptions  Pending Prescriptions Disp Refills  . amLODipine (NORVASC) 10 MG tablet [Pharmacy Med Name: AMLODIPINE BESYLATE 10 MG TAB] 90 tablet 0    Sig: TAKE 1 TABLET BY MOUTH EVERY DAY     Cardiovascular:  Calcium Channel Blockers Passed - 12/20/2018 11:43 AM      Passed - Last BP in normal range    BP Readings from Last 1 Encounters:  10/07/18 126/78         Passed - Valid encounter within last 6 months    Recent Outpatient Visits          2 months ago Hyperlipidemia LDL goal <70   [facility] [place], [place] T, NP   3 months ago Essential hypertension   [facility] [place], [place] T, NP   6 months ago Hyponatremia   [facility] [place], Dorie Rank, NP   6 months ago Annual physical exam   [facility] [place], Dorie Rank, NP   8 months ago Essential hypertension   [facility] Trey Sailors, PA-C      Future Appointments            In 3 months [place], Dorie Rank, NP [employer]

## 2019-01-24 ENCOUNTER — Other Ambulatory Visit: Payer: Self-pay | Admitting: Nurse Practitioner

## 2019-01-24 DIAGNOSIS — I10 Essential (primary) hypertension: Secondary | ICD-10-CM

## 2019-02-18 ENCOUNTER — Encounter: Payer: Self-pay | Admitting: Nurse Practitioner

## 2019-02-18 ENCOUNTER — Other Ambulatory Visit: Payer: Self-pay

## 2019-02-18 ENCOUNTER — Ambulatory Visit (INDEPENDENT_AMBULATORY_CARE_PROVIDER_SITE_OTHER): Payer: Medicare HMO | Admitting: Nurse Practitioner

## 2019-02-18 VITALS — Temp 98.6°F | Ht 70.0 in | Wt 160.0 lb

## 2019-02-18 DIAGNOSIS — R5383 Other fatigue: Secondary | ICD-10-CM | POA: Diagnosis not present

## 2019-02-18 NOTE — Patient Instructions (Signed)

## 2019-02-18 NOTE — Progress Notes (Signed)
Temp 98.6 F (37 C) (Oral)   Ht 5' 10"  (1.778 m)   Wt 160 lb (72.6 kg)   BMI 22.96 kg/m    Subjective:    Patient ID: Mark [name], male    DOB: [birth year], 59 y.o.   MRN: [id number]  HPI: Mark [name] is a 58 y.o. male  Chief Complaint  Patient presents with  . Fatigue    Patient stated he had really bad fatigue yesterday, but does feel better today.     . This visit was completed via telephone due to the restrictions of the COVID-19 pandemic. All issues as above were discussed and addressed but no physical exam was performed. If it was felt that the patient should be evaluated in the office, they were directed there. The patient verbally consented to this visit. Patient was unable to complete an audio/visual visit due to Lack of equipment. Due to the catastrophic nature of the COVID-19 pandemic, this visit was done through audio contact only. . Location of the patient: home . Location of the provider: work . Those involved with this call:  . Provider: Marnee Guarneri, DNP . CMA: Gerda Diss, CMA . Front Desk/Registration: Don Perking  . Time spent on call: 15 minutes on the phone discussing health concerns. 5 minutes total spent in review of patient's record and preparation of their chart. I verified patient identity using two factors (patient name and date of birth). Patient consents verbally to being seen via telemedicine visit today.   FATIGUE Reports he felt bad yesterday, "bad fatigue", but this feels better today and he feels "back to myself".  Denies any sleep issues or URI symptoms.  No exposures to Covid positive patient or recent illness.  No recent travel overseas.  States "I feel good today". Duration: acute x one day Severity: mild Onset: sudden Context when symptoms started:  none Symptoms improve with rest: yes  Depressive symptoms: no Stress/anxiety: no Insomnia: sometimes hard to fall asleep Snoring: no Observed apnea by bed partner:  no Daytime hypersomnolence:no Wakes feeling refreshed: yes History of sleep study: no Dysnea on exertion:  no Orthopnea/PND: no Chest pain: no Chronic cough: no Lower extremity edema: no Arthralgias:no Myalgias: no Weakness: no Rash: no  Relevant past medical, surgical, family and social history reviewed and updated as indicated. Interim medical history since our last visit reviewed. Allergies and medications reviewed and updated.  Review of Systems  Constitutional: Negative for activity change, diaphoresis, fatigue and fever.  Respiratory: Negative for cough, chest tightness, shortness of breath and wheezing.   Cardiovascular: Negative for chest pain, palpitations and leg swelling.  Gastrointestinal: Negative for abdominal distention, abdominal pain, constipation, diarrhea, nausea and vomiting.  Endocrine: Negative for cold intolerance and heat intolerance.  Musculoskeletal: Negative.   Skin: Negative.   Neurological: Negative for dizziness, syncope, weakness, light-headedness, numbness and headaches.  Psychiatric/Behavioral: Negative.     Per HPI unless specifically indicated above     Objective:    Temp 98.6 F (37 C) (Oral)   Ht 5' 10"  (1.778 m)   Wt 160 lb (72.6 kg)   BMI 22.96 kg/m   Wt Readings from Last 3 Encounters:  02/18/19 160 lb (72.6 kg)  10/07/18 157 lb (71.2 kg)  09/04/18 155 lb (70.3 kg)    Physical Exam   Unable to perform due to telephone only visit  Results for orders placed or performed in visit on 10/07/18  Lipid Panel w/o Chol/HDL Ratio  Result Value Ref Range  Cholesterol, Total 157 100 - 199 mg/dL   Triglycerides 89 0 - 149 mg/dL   HDL 50 >39 mg/dL   VLDL Cholesterol Cal 18 5 - 40 mg/dL   LDL Calculated 89 0 - 99 mg/dL  Comp Met (CMET)  Result Value Ref Range   Glucose 94 65 - 99 mg/dL   BUN 8 6 - 24 mg/dL   Creatinine, Ser 1.16 0.76 - 1.27 mg/dL   GFR calc non Af Amer 69 >59 mL/min/1.73   GFR calc Af Amer 80 >59 mL/min/1.73    BUN/Creatinine Ratio 7 (L) 9 - 20   Sodium 140 134 - 144 mmol/L   Potassium 5.7 (H) 3.5 - 5.2 mmol/L   Chloride 101 96 - 106 mmol/L   CO2 23 20 - 29 mmol/L   Calcium 10.2 8.7 - 10.2 mg/dL   Total Protein 7.3 6.0 - 8.5 g/dL   Albumin 4.7 3.8 - 4.9 g/dL   Globulin, Total 2.6 1.5 - 4.5 g/dL   Albumin/Globulin Ratio 1.8 1.2 - 2.2   Bilirubin Total <0.2 0.0 - 1.2 mg/dL   Alkaline Phosphatase 143 (H) 39 - 117 IU/L   AST 24 0 - 40 IU/L   ALT 62 (H) 0 - 44 IU/L      Assessment & Plan:   Problem List Items Addressed This Visit    None    Visit Diagnoses    Fatigue, unspecified type    -  Primary   x one episode.  No further.  Return to office if worsening or any continued issues or if any other symptoms present.        I discussed the assessment and treatment plan with the patient. The patient was provided an opportunity to ask questions and all were answered. The patient agreed with the plan and demonstrated an understanding of the instructions.   The patient was advised to call back or seek an in-person evaluation if the symptoms worsen or if the condition fails to improve as anticipated.   I provided 15 minutes of time during this encounter.  Follow up plan: Return if symptoms worsen or fail to improve.

## 2019-03-05 ENCOUNTER — Ambulatory Visit: Payer: Medicare HMO | Admitting: Nurse Practitioner

## 2019-03-14 ENCOUNTER — Other Ambulatory Visit: Payer: Self-pay | Admitting: Nurse Practitioner

## 2019-03-14 DIAGNOSIS — I10 Essential (primary) hypertension: Secondary | ICD-10-CM

## 2019-04-08 ENCOUNTER — Ambulatory Visit: Payer: Medicare HMO | Admitting: Nurse Practitioner

## 2019-04-10 ENCOUNTER — Telehealth: Payer: Self-pay | Admitting: Nurse Practitioner

## 2019-04-10 NOTE — Chronic Care Management (AMB) (Signed)
°  Chronic Care Management   Outreach Note  04/10/2019 Name: TAEVIN MCFERRAN MRN: [id number] DOB: 12/02/1960  Referred by: Venita Lick, NP Reason for referral : Chronic Care Management (Initial CCM outreach )   An unsuccessful telephone outreach was attempted today. The patient was referred to the case management team by for assistance with chronic care management and care coordination.   Follow Up Plan: The care management team will reach out to the patient again over the next 7 days.   [place]  [email or web address]   ??[phone]

## 2019-04-14 NOTE — Chronic Care Management (AMB) (Signed)
°  Chronic Care Management   Outreach Note  04/14/2019 Name: Mark [name] MRN: [id number] DOB: November 08, 1960  Referred by: Venita Lick, NP Reason for referral : Chronic Care Management (Initial CCM outreach ) and Chronic Care Management (Third CCM outreach was unsuccessful. )   Third unsuccessful telephone outreach was attempted today. The patient was referred to the case management team for assistance with chronic care management and care coordination. The patient's primary care provider has been notified of our unsuccessful attempts to make or maintain contact with the patient. The care management team is pleased to engage with this patient at any time in the future should he/she be interested in assistance from the care management team.   Follow Up Plan: The care management team is available to follow up with the patient after provider conversation with the patient regarding recommendation for care management engagement and subsequent re-referral to the care management team.   [place]  [email or web address]   ??[phone]

## 2019-04-17 ENCOUNTER — Other Ambulatory Visit: Payer: Self-pay

## 2019-04-17 ENCOUNTER — Encounter: Payer: Self-pay | Admitting: Unknown Physician Specialty

## 2019-04-17 ENCOUNTER — Ambulatory Visit: Payer: Self-pay | Admitting: *Deleted

## 2019-04-17 ENCOUNTER — Ambulatory Visit (INDEPENDENT_AMBULATORY_CARE_PROVIDER_SITE_OTHER): Payer: Medicare HMO | Admitting: Unknown Physician Specialty

## 2019-04-17 VITALS — BP 120/85 | HR 102 | Temp 98.6°F

## 2019-04-17 DIAGNOSIS — R5383 Other fatigue: Secondary | ICD-10-CM | POA: Diagnosis not present

## 2019-04-17 DIAGNOSIS — R740 Nonspecific elevation of levels of transaminase and lactic acid dehydrogenase [LDH]: Secondary | ICD-10-CM | POA: Diagnosis not present

## 2019-04-17 LAB — CBC WITH DIFFERENTIAL/PLATELET
Hematocrit: 47.3 % (ref 37.5–51.0)
Hemoglobin: 16.3 g/dL (ref 13.0–17.7)
Lymphocytes Absolute: 1.5 10*3/uL (ref 0.7–3.1)
Lymphs: 20 %
MCH: 31.6 pg (ref 26.6–33.0)
MCHC: 34.5 g/dL (ref 31.5–35.7)
MCV: 92 fL (ref 79–97)
MID (Absolute): 0.2 10*3/uL (ref 0.1–1.6)
MID: 3 %
Neutrophils Absolute: 6.2 10*3/uL (ref 1.4–7.0)
Neutrophils: 77 %
Platelets: 369 10*3/uL (ref 150–450)
RBC: 5.16 x10E6/uL (ref 4.14–5.80)
RDW: 14.1 % (ref 11.6–15.4)
WBC: 7.9 10*3/uL (ref 3.4–10.8)

## 2019-04-17 LAB — UA/M W/RFLX CULTURE, ROUTINE
Bilirubin, UA: NEGATIVE
Glucose, UA: NEGATIVE
Leukocytes,UA: NEGATIVE
Nitrite, UA: NEGATIVE
Specific Gravity, UA: 1.02 (ref 1.005–1.030)
Urobilinogen, Ur: 1 mg/dL (ref 0.2–1.0)
pH, UA: 5.5 (ref 5.0–7.5)

## 2019-04-17 LAB — MICROSCOPIC EXAMINATION
Bacteria, UA: NONE SEEN
RBC, Urine: >30 /hpf — AB (ref 0–2)
WBC, UA: NONE SEEN /hpf (ref 0–5)

## 2019-04-17 MED ORDER — CITALOPRAM HYDROBROMIDE 20 MG PO TABS: 20.0000 mg | ORAL_TABLET | Freq: Every day | ORAL | 1 refills | Status: DC

## 2019-04-17 NOTE — Progress Notes (Signed)
BP 120/85   Pulse (!) 102   Temp 98.6 F (37 C) (Oral)   SpO2 97%    Subjective:    Patient ID: Mark [name], male    DOB: Dec 21, 1960, 58 y.o.   MRN: [id number]  HPI: Mark [name] is a 58 y.o. male  Chief Complaint  Patient presents with  . Hypertension  . Fatigue    pt states he has been feeling very tired and fatigued for the past 2 to 3 days   Pt states he just "doesn't feel well".  No fever or cough.  Tired for about 3 days.  Urinating OK.  No nausea vomiting or diarrhea.  No problems with taste or smell.  Feels he might be dehydrated.  Not sure if he stopped taking the Citalopram.   No alcohol or drugs.    Relevant past medical, surgical, family and social history reviewed and updated as indicated. Interim medical history since our last visit reviewed. Allergies and medications reviewed and updated.  Review of Systems  Constitutional: Positive for fatigue. Negative for chills, fever and unexpected weight change.  HENT: Negative for congestion, sinus pressure and sore throat.   Respiratory: Negative for cough, shortness of breath and wheezing.   Cardiovascular: Negative for chest pain.  Gastrointestinal: Negative for abdominal distention, abdominal pain, diarrhea and nausea.  Endocrine: Negative for cold intolerance, heat intolerance, polydipsia, polyphagia and polyuria.  Genitourinary: Negative for difficulty urinating.  Musculoskeletal: Negative for back pain and myalgias.  Neurological: Negative for headaches.  Hematological: Negative for adenopathy.  Psychiatric/Behavioral: Negative for agitation.    Per HPI unless specifically indicated above     Objective:    BP 120/85   Pulse (!) 102   Temp 98.6 F (37 C) (Oral)   SpO2 97%   Wt Readings from Last 3 Encounters:  02/18/19 160 lb (72.6 kg)  10/07/18 157 lb (71.2 kg)  09/04/18 155 lb (70.3 kg)    Physical Exam Constitutional:      General: He is not in acute distress.    Appearance: Normal  appearance. He is well-developed.  HENT:     Head: Normocephalic and atraumatic.  Eyes:     General: Lids are normal. No scleral icterus.       Right eye: No discharge.        Left eye: No discharge.     Conjunctiva/sclera: Conjunctivae normal.  Neck:     Musculoskeletal: Normal range of motion and neck supple.     Vascular: No carotid bruit or JVD.  Cardiovascular:     Rate and Rhythm: Normal rate and regular rhythm.     Heart sounds: Normal heart sounds.  Pulmonary:     Effort: Pulmonary effort is normal. No respiratory distress.     Breath sounds: Normal breath sounds.  Abdominal:     Palpations: There is no hepatomegaly or splenomegaly.  Musculoskeletal: Normal range of motion.  Skin:    General: Skin is warm and dry.     Coloration: Skin is not pale.     Findings: No rash.  Neurological:     Mental Status: He is alert and oriented to person, place, and time.  Psychiatric:        Behavior: Behavior normal.        Thought Content: Thought content normal.        Judgment: Judgment normal.   CBC and urine is normal  Results for orders placed or performed in visit on 10/07/18  Lipid  Panel w/o Chol/HDL Ratio  Result Value Ref Range   Cholesterol, Total 157 100 - 199 mg/dL   Triglycerides 89 0 - 149 mg/dL   HDL 50 >39 mg/dL   VLDL Cholesterol Cal 18 5 - 40 mg/dL   LDL Calculated 89 0 - 99 mg/dL  Comp Met (CMET)  Result Value Ref Range   Glucose 94 65 - 99 mg/dL   BUN 8 6 - 24 mg/dL   Creatinine, Ser 1.16 0.76 - 1.27 mg/dL   GFR calc non Af Amer 69 >59 mL/min/1.73   GFR calc Af Amer 80 >59 mL/min/1.73   BUN/Creatinine Ratio 7 (L) 9 - 20   Sodium 140 134 - 144 mmol/L   Potassium 5.7 (H) 3.5 - 5.2 mmol/L   Chloride 101 96 - 106 mmol/L   CO2 23 20 - 29 mmol/L   Calcium 10.2 8.7 - 10.2 mg/dL   Total Protein 7.3 6.0 - 8.5 g/dL   Albumin 4.7 3.8 - 4.9 g/dL   Globulin, Total 2.6 1.5 - 4.5 g/dL   Albumin/Globulin Ratio 1.8 1.2 - 2.2   Bilirubin Total <0.2 0.0 - 1.2  mg/dL   Alkaline Phosphatase 143 (H) 39 - 117 IU/L   AST 24 0 - 40 IU/L   ALT 62 (H) 0 - 44 IU/L      Assessment & Plan:   Problem List Items Addressed This Visit    None    Visit Diagnoses    Fatigue, unspecified type    -  Primary   ? related to stopping Citalopram.  ? fatigue related.  Will restart at 20 mg/day.     Relevant Orders   CBC With Differential/Platelet   UA/M w/rflx Culture, Routine   Comprehensive metabolic panel       Follow up plan: Return in about 1 week (around 04/24/2019).

## 2019-04-17 NOTE — Patient Instructions (Signed)
Restart Citalopram.  Rx called in

## 2019-04-17 NOTE — Telephone Encounter (Signed)
Patient is calling to report he is not feeling good- very short on the phone- demanding appointment- denies chest pain, SOB and states he is able to walk. Patient is not wanting to answer questions- advised office is closed for lunch and he needs to go to [facility] for evaluation- he refuses due to no license wants to come to office.  Mother got on line and she was advised the same- they want to wait until office opens for advisement.   Reason for Disposition . [1] MODERATE dizziness (e.g., interferes with normal activities) AND [2] has NOT been evaluated by physician for this  (Exception: dizziness caused by heat exposure, sudden standing, or poor fluid intake)  Answer Assessment - Initial Assessment Questions 1. DESCRIPTION: "Describe your dizziness."     Feels bad- like he is going to pass 2. LIGHTHEADED: "Do you feel lightheaded?" (e.g., somewhat faint, woozy, weak upon standing)     yes 3. VERTIGO: "Do you feel like either you or the room is spinning or tilting?" (i.e. vertigo)     n/a 4. SEVERITY: "How bad is it?"  "Do you feel like you are going to faint?" "Can you stand and walk?"   - MILD - walking normally   - MODERATE - interferes with normal activities (e.g., work, school)    - SEVERE - unable to stand, requires support to walk, feels like passing out now.      moderate 5. ONSET:  "When did the dizziness begin?"     All morning 6. AGGRAVATING FACTORS: "Does anything make it worse?" (e.g., standing, change in head position)     nothing 7. HEART RATE: "Can you tell me your heart rate?" "How many beats in 15 seconds?"  (Note: not all patients can do this)       n/a 8. CAUSE: "What do you think is causing the dizziness?"     unknown 9. RECURRENT SYMPTOM: "Have you had dizziness before?" If so, ask: "When was the last time?" "What happened that time?"     no 10. OTHER SYMPTOMS: "Do you have any other symptoms?" (e.g., fever, chest pain, vomiting, diarrhea, bleeding)       no 11.  PREGNANCY: "Is there any chance you are pregnant?" "When was your last menstrual period?"       n/a  Protocols used: DIZZINESS [facility]

## 2019-04-18 ENCOUNTER — Other Ambulatory Visit: Payer: Self-pay | Admitting: Unknown Physician Specialty

## 2019-04-18 DIAGNOSIS — R7401 Elevation of levels of liver transaminase levels: Secondary | ICD-10-CM

## 2019-04-18 LAB — COMPREHENSIVE METABOLIC PANEL
ALT: 184 IU/L — ABNORMAL HIGH (ref 0–44)
AST: 181 IU/L — ABNORMAL HIGH (ref 0–40)
Albumin/Globulin Ratio: 1.6 (ref 1.2–2.2)
Albumin: 4.9 g/dL (ref 3.8–4.9)
Alkaline Phosphatase: 178 IU/L — ABNORMAL HIGH (ref 39–117)
BUN/Creatinine Ratio: 10 (ref 9–20)
BUN: 11 mg/dL (ref 6–24)
Bilirubin Total: 0.7 mg/dL (ref 0.0–1.2)
CO2: 21 mmol/L (ref 20–29)
Calcium: 10.1 mg/dL (ref 8.7–10.2)
Chloride: 96 mmol/L (ref 96–106)
Creatinine, Ser: 1.08 mg/dL (ref 0.76–1.27)
GFR calc Af Amer: 87 mL/min/{1.73_m2} (ref >59)
GFR calc non Af Amer: 75 mL/min/{1.73_m2} (ref >59)
Globulin, Total: 3 g/dL (ref 1.5–4.5)
Glucose: 98 mg/dL (ref 65–99)
Potassium: 5.2 mmol/L (ref 3.5–5.2)
Sodium: 137 mmol/L (ref 134–144)
Total Protein: 7.9 g/dL (ref 6.0–8.5)

## 2019-04-18 NOTE — Progress Notes (Unsigned)
Hepatitis labs drawn due to elevated transaminases

## 2019-04-20 ENCOUNTER — Other Ambulatory Visit: Payer: Self-pay | Admitting: Nurse Practitioner

## 2019-04-20 DIAGNOSIS — I10 Essential (primary) hypertension: Secondary | ICD-10-CM

## 2019-04-23 LAB — HEPATITIS PANEL, ACUTE
Hep A IgM: NEGATIVE
Hep B C IgM: NEGATIVE
Hep C Virus Ab: <0.1 s/co ratio (ref 0.0–0.9)
Hepatitis B Surface Ag: NEGATIVE

## 2019-04-23 LAB — SPECIMEN STATUS REPORT

## 2019-04-25 ENCOUNTER — Encounter: Payer: Self-pay | Admitting: Nurse Practitioner

## 2019-04-25 ENCOUNTER — Ambulatory Visit (INDEPENDENT_AMBULATORY_CARE_PROVIDER_SITE_OTHER): Payer: Medicare HMO | Admitting: Nurse Practitioner

## 2019-04-25 ENCOUNTER — Ambulatory Visit: Payer: Self-pay | Admitting: *Deleted

## 2019-04-25 ENCOUNTER — Other Ambulatory Visit: Payer: Self-pay

## 2019-04-25 VITALS — BP 127/92 | HR 75 | Temp 98.4°F

## 2019-04-25 DIAGNOSIS — R748 Abnormal levels of other serum enzymes: Secondary | ICD-10-CM

## 2019-04-25 NOTE — Chronic Care Management (AMB) (Signed)
  Chronic Care Management   Initial Visit Note  04/25/2019 Name: Mark [name] MRN: [id number] DOB: 29-Nov-1960  Referred by: Venita Lick, NP Reason for referral : Care Coordination ([facility] referral fro immediate transportation needs)   Mark [name] is a 58 y.o. year old male who is a primary care patient of Cannady, Barbaraann Faster, NP. The CCM team was consulted for assistance with chronic disease management and care coordination needs.   Review of patient status, including review of consultants reports, relevant laboratory and other test results, and collaboration with appropriate care team members and the patient's provider was performed as part of comprehensive patient evaluation and provision of chronic care management services.    SDOH (Social Determinants of Health) screening performed today. See Care Plan Entry related to challenges with: Transportation  Objective:   Goals Addressed            This Visit's Progress   . RN- Transportation       Current Barriers:  . Transportation barriers  Nurse Case Manager Clinical Goal(s):  Marland Kitchen Over the next 30  days, patient will work with [facility] to address needs related to Transportation Barriers   Interventions:  . Collaborated with PCP regarding transportation needs . Care Guide referral for Transportation barrier and urgent need for transportation for upcoming tests  Patient Self Care Activities:  . Currently UNABLE TO independently drive to medical appointments  Initial goal documentation       [name] was given information about Chronic Care Management services today including:  1. CCM service includes personalized support from designated clinical staff supervised by his physician, including individualized plan of care and coordination with other care providers 2. 24/7 contact phone numbers for assistance for urgent and routine care needs. 3. Service will only be billed when office clinical staff spend 20  minutes or more in a month to coordinate care. 4. Only one practitioner may furnish and bill the service in a calendar month. 5. The patient may stop CCM services at any time (effective at the end of the month) by phone call to the office staff. 6. The patient will be responsible for cost sharing (co-pay) of up to 20% of the service fee (after annual deductible is met).  Patient agreed to services and verbal consent obtained. at PCP visit.   Plan:  Care Guide to reach out to assist with transportation needs. RNCM will plan to f/u with patient in the next 28 days.    Merlene Morse Arturo Freundlich RN, BSN Nurse Case Editor, [facility]  [phone]) [employer]

## 2019-04-25 NOTE — Assessment & Plan Note (Addendum)
Elevation in ALT/AST, Alk Phos.  Hepatitis panel negative.  Patient with history of substance abuse (Percocet and cocaine).  Will obtain repeat CMP, Tylenol level, and UDS.  Order for abdominal ultrasound limited to assess liver (patient refuses to obtain today).  CCM referral placed to assist with transportation needs.  At length discussion with patient and his mother, they are aware if worsening symptoms over weekend patient is immediately to go to ER for further evaluation.  Return in 5 days.

## 2019-04-25 NOTE — Progress Notes (Signed)
BP (!) 127/92   Pulse 75   Temp 98.4 F (36.9 C) (Oral)   SpO2 96%    Subjective:    Patient ID: Mark [name], male    DOB: Sep 09, 1960, 58 y.o.   MRN: [id number]  HPI: Mark [name] is a 58 y.o. male  Chief Complaint  Patient presents with  . Fatigue    1 week f/up- pt states he still feels bad   FATIGUE Seen last week for fatigue.  Labs performed noting elevation in LFT AST/ALT 181/184 with Alk phos 178.  Previous labs 6 months prior AST/ALT 24/62.  He has history of drug abuse, last hospitalization for acute hepatitis in 2017.  At this time hepatitis labs returned negative.  He denies any drug, alcohol, or Tylenol use.  In past he used cocaine, last was "years and years ago".  Also has history of overdose and use of Percocet, which at this time he denies use of.  He denies abdominal pain, N&V, constipation, diarrhea, muscle pain.  Discussed need for abdominal ultrasound.  He refuses to go today, stating initially no transportation as he lives with his mother and she can not drive that far + he does not drive.  When offered to assist with transportation today, he again refused to go stating "I will go next week for it, I don't feel like going today".  At this time he reports feeling bad, but is unable to report what feels bad, just states "I feel tired".   Spoke to his mother who was present in office for visit.  She reports that she goes through his drawers, has not found any drugs, but "if he is using he is keeping it hidden".  States that for past two weeks he has mainly been lying on couch and doing nothing.  She reports he is eating well without N&V.  Discussed with her holding his Pravastatin at this time until ultrasound and blood work returned.  Also educated her on avoiding Tylenol or alcohol at home.  She reports she can not drive him to ultrasound, as he does not drive as far as hospital.  Discussed with both patient and mother referral to CCM to assist with transportation  needs, they agree with this plan Duration:  weeks, several weeks ago Severity: moderate Onset: sudden Context when symptoms started:  none Symptoms improve with rest: no  Depressive symptoms: no Stress/anxiety: no Insomnia: none Snoring: no Observed apnea by bed partner: no Daytime hypersomnolence:no Wakes feeling refreshed: no History of sleep study: no Dysnea on exertion:  no Orthopnea/PND: no Chest pain: no Chronic cough: no Lower extremity edema: no Arthralgias:no Myalgias: no Weakness: no Rash: no  Relevant past medical, surgical, family and social history reviewed and updated as indicated. Interim medical history since our last visit reviewed. Allergies and medications reviewed and updated.  Review of Systems  Constitutional: Positive for fatigue. Negative for activity change, diaphoresis and fever.  Respiratory: Negative for cough, chest tightness, shortness of breath and wheezing.   Cardiovascular: Negative for chest pain, palpitations and leg swelling.  Gastrointestinal: Negative for abdominal distention, abdominal pain, blood in stool, constipation, diarrhea, nausea and vomiting.  Endocrine: Negative for cold intolerance and heat intolerance.  Musculoskeletal: Negative for myalgias.  Neurological: Negative for dizziness, syncope, weakness, light-headedness, numbness and headaches.  Psychiatric/Behavioral: Negative.     Per HPI unless specifically indicated above     Objective:    BP (!) 127/92   Pulse 75   Temp 98.4  F (36.9 C) (Oral)   SpO2 96%   Wt Readings from Last 3 Encounters:  02/18/19 160 lb (72.6 kg)  10/07/18 157 lb (71.2 kg)  09/04/18 155 lb (70.3 kg)    Physical Exam Vitals signs and nursing note reviewed.  Constitutional:      General: He is awake. He is not in acute distress.    Appearance: He is well-developed. He is not ill-appearing.  HENT:     Head: Normocephalic and atraumatic.     Right Ear: Hearing normal. No drainage.      Left Ear: Hearing normal. No drainage.     Mouth/Throat:     Mouth: Mucous membranes are moist.     Pharynx: Uvula midline.     Comments: Oral mucosa pink in color. Eyes:     General: Lids are normal. No scleral icterus.       Right eye: No discharge.        Left eye: No discharge.     Conjunctiva/sclera: Conjunctivae normal.     Pupils: Pupils are equal, round, and reactive to light.  Neck:     Musculoskeletal: Normal range of motion and neck supple.     Thyroid: No thyromegaly.     Vascular: No carotid bruit.  Cardiovascular:     Rate and Rhythm: Normal rate and regular rhythm.     Heart sounds: Normal heart sounds, S1 normal and S2 normal. No murmur. No gallop.   Pulmonary:     Effort: Pulmonary effort is normal. No accessory muscle usage or respiratory distress.     Breath sounds: Normal breath sounds.  Abdominal:     General: Bowel sounds are normal.     Palpations: Abdomen is soft. There is no hepatomegaly or splenomegaly.     Tenderness: There is no abdominal tenderness. There is no guarding or rebound. Negative signs include Murphy's sign.  Musculoskeletal: Normal range of motion.     Right lower leg: No edema.     Left lower leg: No edema.  Lymphadenopathy:     Cervical: No cervical adenopathy.  Skin:    General: Skin is warm and dry.     Capillary Refill: Capillary refill takes less than 2 seconds.     Findings: No rash.  Neurological:     Mental Status: He is alert and oriented to person, place, and time.     Deep Tendon Reflexes: Reflexes are normal and symmetric.  Psychiatric:        Attention and Perception: Attention normal.        Mood and Affect: Affect is flat.        Speech: Speech normal.        Behavior: Behavior normal. Behavior is cooperative.     Results for orders placed or performed in visit on 04/17/19  Microscopic Examination   BLD  Result Value Ref Range   WBC, UA None seen 0 - 5 /hpf   RBC >30 (A) 0 - 2 /hpf   Epithelial Cells (non  renal) 0-10 0 - 10 /hpf   Bacteria, UA None seen None seen/Few  CBC With Differential/Platelet  Result Value Ref Range   WBC 7.9 3.4 - 10.8 x10E3/uL   RBC 5.16 4.14 - 5.80 x10E6/uL   Hemoglobin 16.3 13.0 - 17.7 g/dL   Hematocrit 47.3 37.5 - 51.0 %   MCV 92 79 - 97 fL   MCH 31.6 26.6 - 33.0 pg   MCHC 34.5 31.5 - 35.7 g/dL   RDW  14.1 11.6 - 15.4 %   Platelets 369 150 - 450 x10E3/uL   Neutrophils 77 Not Estab. %   Lymphs 20 Not Estab. %   MID 3 Not Estab. %   Neutrophils Absolute 6.2 1.4 - 7.0 x10E3/uL   Lymphocytes Absolute 1.5 0.7 - 3.1 x10E3/uL   MID (Absolute) 0.2 0.1 - 1.6 X10E3/uL  UA/M w/rflx Culture, Routine   Specimen: Blood   BLD  Result Value Ref Range   Specific Gravity, UA 1.020 1.005 - 1.030   pH, UA 5.5 5.0 - 7.5   Color, UA Yellow Yellow   Appearance Ur Clear Clear   Leukocytes,UA Negative Negative   Protein,UA 1+ (A) Negative/Trace   Glucose, UA Negative Negative   Ketones, UA 4+ (A) Negative   RBC, UA 3+ (A) Negative   Bilirubin, UA Negative Negative   Urobilinogen, Ur 1.0 0.2 - 1.0 mg/dL   Nitrite, UA Negative Negative   Microscopic Examination See below:   Comprehensive metabolic panel  Result Value Ref Range   Glucose 98 65 - 99 mg/dL   BUN 11 6 - 24 mg/dL   Creatinine, Ser 1.08 0.76 - 1.27 mg/dL   GFR calc non Af Amer 75 >59 mL/min/1.73   GFR calc Af Amer 87 >59 mL/min/1.73   BUN/Creatinine Ratio 10 9 - 20   Sodium 137 134 - 144 mmol/L   Potassium 5.2 3.5 - 5.2 mmol/L   Chloride 96 96 - 106 mmol/L   CO2 21 20 - 29 mmol/L   Calcium 10.1 8.7 - 10.2 mg/dL   Total Protein 7.9 6.0 - 8.5 g/dL   Albumin 4.9 3.8 - 4.9 g/dL   Globulin, Total 3.0 1.5 - 4.5 g/dL   Albumin/Globulin Ratio 1.6 1.2 - 2.2   Bilirubin Total 0.7 0.0 - 1.2 mg/dL   Alkaline Phosphatase 178 (H) 39 - 117 IU/L   AST 181 (H) 0 - 40 IU/L   ALT 184 (H) 0 - 44 IU/L  Hepatitis panel, acute  Result Value Ref Range   Hep A IgM Negative Negative   Hepatitis B Surface Ag Negative  Negative   Hep B C IgM Negative Negative   Hep C Virus Ab <0.1 0.0 - 0.9 s/co ratio  Specimen status report  Result Value Ref Range   specimen status report Comment       Assessment & Plan:   Problem List Items Addressed This Visit      Other   Elevated liver enzymes - Primary    Elevation in ALT/AST, Alk Phos.  Hepatitis panel negative.  Patient with history of substance abuse (Percocet and cocaine).  Will obtain repeat CMP, Tylenol level, and UDS.  Order for abdominal ultrasound limited to assess liver (patient refuses to obtain today).  CCM referral placed to assist with transportation needs.  At length discussion with patient and his mother, they are aware if worsening symptoms over weekend patient is immediately to go to ER for further evaluation.  Return in 5 days.      Relevant Orders   US Abdomen Limited RUQ   Comprehensive metabolic panel   Drugs of abuse scrn w alc, routine urine   Acetaminophen level      Time: 25 minutes, >50% spent counseling/or care coordination with patient and mother  Follow up plan: Return in about 5 days (around 04/30/2019) for Fatigue.

## 2019-04-25 NOTE — Patient Instructions (Signed)
Liver Function Tests Why am I having this test? Liver function tests are done to see how well your liver is working. The proteins and enzymes measured in the test can alert your health care provider to inflammation, damage, or disease in your liver. It is common to have liver function tests:  When you are taking certain medicines.  If you have liver disease.  If you drink a lot of alcohol.  When you are not feeling well.  When you have other conditions that may affect your liver.  During annual physical exams.  If you have symptoms such as yellowing of the skin (jaundice), abdominal pain, or nausea and vomiting. What is being tested? These tests measure various substances in your blood. This may include:  Alanine transaminase (ALT). This is an enzyme in the liver.  Aspartate transaminase (AST). This is an enzyme in the liver, heart, and muscles.  Alkaline phosphatase (ALP). This is a protein in the liver, bile ducts, bone, and other body tissues.  Total bilirubin. This is a yellow pigment in bile.  Albumin. This is a protein in the liver.  Prothrombin time and international normalized ratio (PT and INR). PT measures the time it takes for your blood to clot. INR is a calculation of blood clotting time based on your PT result. It is also calculated based on normal ranges defined by the lab that processed your test.  Total protein. This includes two proteins, albumin and globulin, found in the blood. What kind of sample is taken?  A blood sample is required for this test. It is usually collected by inserting a needle into a blood vessel. How do I prepare for this test? How you prepare will depend on which tests are being done and the reason for doing them. You may need to:  Avoid eating for 4-6 hours before the test, or as told by your health care provider.  Stop taking certain medicines before your blood test, as told by your health care provider. Tell a health care provider  about:  All medicines you are taking, including vitamins, herbs, eye drops, creams, and over-the-counter medicines.  Any medical conditions you have.  Whether you are pregnant or may be pregnant. How are the results reported? Your test results will be reported as values. Your health care provider will compare your results to normal ranges that were established after testing a large group of people (reference ranges). Reference ranges may vary among labs and hospitals. For the substances measured in liver function tests, common reference ranges are: ALT  Infant: 10-40 international units/L.  Child or adult: 4-36 international units/L at 37C or 4-36 units/L (SI units).  Reference ranges may be higher for older adults. AST  Newborn 44-22 days old: 35-140 units/L.  Child younger than 92 years old: 15-60 units/L.  56-71 years old: 15-50 units/L.  97-7 years old: 10-50 units/L.  57-57 years old: 10-40 units/L.  Adult: 0-35 units/L or 0-0.58 microkatals/L (SI units).  Reference ranges may be higher for older adults. ALP  Child younger than 61 years old: 85-235 units/L.  20-66 years old: 65-210 units/L.  11-67 years old: 60-300 units/L.  1-26 years old: 30-200 units/L.  Adult: 30-120 units/L or 0.5-2.0 microkatals/L (SI units).  Reference ranges may be higher for older adults. Total bilirubin  Newborn: 1.0-12.0 mg/dL or 17.1-205 micromoles/L (SI units).  Child or adult: 0.3-1.0 mg/dL or 5.1-17 micromoles/L. Albumin  Premature infant: 3.0-4.2 g/dL.  Newborn: 3.5-5.4 g/dL.  Infant: 4.4-5.4 g/dL.  Child:  4.0-5.9 g/dL.  Adult: 3.5-5.0 g/dL or 35-50 g/L (SI units). PT  11.0-12.5 seconds; 85%-100%. INR  0.8-1.1. Total protein  Premature infant: 4.2-7.6 g/dL.  Newborn: 4.6-7.4 g/dL.  Infant: 6.0-6.7 g/dL.  Child: 6.2-8.0 g/dL.  Adult: 6.4-8.3 g/dL or 64-83 g/L (SI units). What do the results mean? Results that are within the reference ranges are considered  normal. For each substance measured, results outside the reference range can indicate various health issues. ALT  Levels above the normal range may indicate liver disease. AST  Levels above the normal range may indicate liver disease. Sometimes levels also increase after burns, surgery, heart attack, muscle damage, or seizure. ALP  Levels above the normal range may be seen in biliary obstruction, liver diseases, bone disease, thyroid disease, tumors, fractures, leukemia, lymphoma, or several other conditions. People with blood type O or B may show higher levels after a fatty meal.  Levels below the normal range may indicate bone and teeth conditions, malnutrition, protein deficiency, or Wilson's disease. Total bilirubin  Levels above the normal range may indicate problems with the liver, gallbladder, or bile ducts. Albumin  Levels above the normal range may indicate dehydration. They may also be caused by a diet that is high in protein.  Levels below the normal range may indicate kidney disease, liver disease, or malabsorption of nutrients. PT and INR  Levels above the normal range mean that your blood is clotting slower than normal. This may be due to blood disorders, liver disorders, or low levels of vitamin K. Total protein  Levels above the normal range may be due to infection or other diseases.  Levels below the normal range may be due to an immune system disorder, bleeding, burns, kidney disorder, liver disease, trouble absorbing or getting nutrients, or other conditions that affect the intestines. Talk with your health care provider about what your results mean. Questions to ask your health care provider Ask your health care provider, or the department that is doing the test:  When will my results be ready?  How will I get my results?  What are my treatment options?  What other tests do I need?  What are my next steps? Summary  Liver function tests are done to see  how well your liver is working.  These tests measure various proteins and enzymes in your blood. The results can alert your health care provider to inflammation, damage, or disease in your liver.  Talk with your health care provider about what your results mean. This information is not intended to replace advice given to you by your health care provider. Make sure you discuss any questions you have with your health care provider. Document Released: 09/02/2004 Document Revised: 03/20/2018 Document Reviewed: 05/15/2017 Elsevier Patient Education  2020 [employer].

## 2019-04-28 ENCOUNTER — Telehealth: Payer: Self-pay | Admitting: Nurse Practitioner

## 2019-04-28 LAB — ACETAMINOPHEN LEVEL: Acetaminophen (Tylenol), S: 15 ug/mL (ref 10–30)

## 2019-04-28 LAB — COMPREHENSIVE METABOLIC PANEL
ALT: 28 IU/L (ref 0–44)
AST: 21 IU/L (ref 0–40)
Albumin/Globulin Ratio: 1.7 (ref 1.2–2.2)
Albumin: 4.8 g/dL (ref 3.8–4.9)
Alkaline Phosphatase: 119 IU/L — ABNORMAL HIGH (ref 39–117)
BUN/Creatinine Ratio: 11 (ref 9–20)
BUN: 12 mg/dL (ref 6–24)
Bilirubin Total: 0.5 mg/dL (ref 0.0–1.2)
CO2: 21 mmol/L (ref 20–29)
Calcium: 9.8 mg/dL (ref 8.7–10.2)
Chloride: 98 mmol/L (ref 96–106)
Creatinine, Ser: 1.12 mg/dL (ref 0.76–1.27)
GFR calc Af Amer: 83 mL/min/{1.73_m2} (ref >59)
GFR calc non Af Amer: 72 mL/min/{1.73_m2} (ref >59)
Globulin, Total: 2.8 g/dL (ref 1.5–4.5)
Glucose: 94 mg/dL (ref 65–99)
Potassium: 4.7 mmol/L (ref 3.5–5.2)
Sodium: 137 mmol/L (ref 134–144)
Total Protein: 7.6 g/dL (ref 6.0–8.5)

## 2019-04-28 NOTE — Telephone Encounter (Signed)
Pt's mother called back and confirmed the pick up address, I told her I would get back in touch once reservations had been confirmed through [place] transportation for appt on 9/21 knb

## 2019-04-28 NOTE — Telephone Encounter (Signed)
Added patient onto schedule at 3:30 Wednesday, called and notified patient's mother of appointment.

## 2019-04-28 NOTE — Telephone Encounter (Signed)
Spoke to patient's mother, she reports patient has been sleepy all weekend.  Labs from Friday had improved from previous (LFT in normal range and alk phos trending down), UDS pending.  She states he has not been eating and drinking as much as he usually does + is lying in bed more.  She does not think he has done any recent drugs, as he has been at home.  He is scheduled to go for ultrasound on the 21st.  Discussed with her that if worsening symptoms and concerns she is to take him to ER immediately.  At this time she would prefer for him to see provider later this week.  Again reiterated that if worsening symptoms she should not wait until appointment, but take him to ER immediately.  She agrees with this plan of care.    [place]: Can you please add him to my 2:30 or 3:30 on Wednesday or have front crew add him there?  Thank you.

## 2019-04-28 NOTE — Telephone Encounter (Signed)
Mark [name], I spoke with pt's mother to confirm transportation for his ultrasound on 9/21 she stated that he has not done much of anything all weekend and she is concerned. He is very lethargic, little to no appetite and not drinking very much fluid. He is not running a fever, she stated that today is his birthday and she said you wouldn't know it he's been sleeping pretty much all day.  Please call her at home to discuss at [phone] Thanks, [name]

## 2019-04-28 NOTE — Telephone Encounter (Signed)
° ° °  Called pt regarding [facility] Referral for transportation to [facility] for Abdominal Ultrasound. Pt stated that he had not heard from Radiology yet to schedule.  Conferenced [facility] Leonel Ramsay and spoke with Aniceto Boss appt scheduled for 8:45am on 9/21 arrival time of 8:30am pt to be NPO. Pt confirmed that was okay. He asked that I call back in one hour to speak with his mother about transportation  [place], [place] [id number]  To: [facility]  [address],  [place], [place] [address]  Will call back home # at 12:15pm   [place]  ??[email or web address]  ?? [phone]   Skype

## 2019-04-28 NOTE — Telephone Encounter (Signed)
Transportation Confirmation  [phone] - Pick up from Home: 7:45am Pick up from [place] 9:30am [place]

## 2019-04-28 NOTE — Telephone Encounter (Signed)
Spoke with mother and gave her confirmation # for Transportation for 9/21 visit she stated that her son had not been feeling well. Will create a new encounter and route it to [place].  KNB

## 2019-04-29 ENCOUNTER — Other Ambulatory Visit: Payer: Self-pay | Admitting: Pharmacist

## 2019-04-29 DIAGNOSIS — R7401 Elevation of levels of liver transaminase levels: Secondary | ICD-10-CM

## 2019-04-29 DIAGNOSIS — G8190 Hemiplegia, unspecified affecting unspecified side: Secondary | ICD-10-CM

## 2019-04-29 DIAGNOSIS — I6389 Other cerebral infarction: Secondary | ICD-10-CM

## 2019-04-29 NOTE — Progress Notes (Signed)
Order placed for Connected Care team for transportation assistance

## 2019-04-30 ENCOUNTER — Ambulatory Visit: Payer: Self-pay | Admitting: *Deleted

## 2019-04-30 ENCOUNTER — Other Ambulatory Visit: Payer: Self-pay

## 2019-04-30 ENCOUNTER — Telehealth: Payer: Self-pay | Admitting: Nurse Practitioner

## 2019-04-30 ENCOUNTER — Ambulatory Visit (INDEPENDENT_AMBULATORY_CARE_PROVIDER_SITE_OTHER): Payer: Medicare HMO | Admitting: Nurse Practitioner

## 2019-04-30 ENCOUNTER — Encounter: Payer: Self-pay | Admitting: Nurse Practitioner

## 2019-04-30 VITALS — BP 105/79 | HR 66 | Temp 98.3°F | Ht 70.0 in | Wt 147.0 lb

## 2019-04-30 DIAGNOSIS — R7989 Other specified abnormal findings of blood chemistry: Secondary | ICD-10-CM | POA: Diagnosis not present

## 2019-04-30 DIAGNOSIS — R5383 Other fatigue: Secondary | ICD-10-CM | POA: Diagnosis not present

## 2019-04-30 DIAGNOSIS — F17219 Nicotine dependence, cigarettes, with unspecified nicotine-induced disorders: Secondary | ICD-10-CM | POA: Diagnosis not present

## 2019-04-30 DIAGNOSIS — R634 Abnormal weight loss: Secondary | ICD-10-CM

## 2019-04-30 DIAGNOSIS — Z122 Encounter for screening for malignant neoplasm of respiratory organs: Secondary | ICD-10-CM | POA: Diagnosis not present

## 2019-04-30 NOTE — Addendum Note (Signed)
Addended by: Marnee Guarneri T on: 04/30/2019 09:16 PM   Modules accepted: Orders

## 2019-04-30 NOTE — Chronic Care Management (AMB) (Signed)
  Chronic Care Management   Follow Up Note   04/30/2019 Name: Mark [name] MRN: [id number] DOB: Dec 29, 1960  Referred by: Venita Lick, NP Reason for referral : Chronic Care Management (Weight loss) and Care Coordination (Transportation )   Mark [name] is a 58 y.o. year old male who is a primary care patient of Cannady, Barbaraann Faster, NP. The CCM team was consulted for assistance with chronic disease management and care coordination needs.    Review of patient status, including review of consultants reports, relevant laboratory and other test results, and collaboration with appropriate care team members and the patient's provider was performed as part of comprehensive patient evaluation and provision of chronic care management services.    SDOH (Social Determinants of Health) screening performed today: Air cabin crew Strain  Tobacco Use. See Care Plan for related entries.   Advanced Directives Status: N See Care Plan and [facility] application for related entries.  Outpatient Encounter Medications as of 04/30/2019  Medication Sig  . amLODipine (NORVASC) 10 MG tablet TAKE 1 TABLET BY MOUTH EVERY DAY  . aspirin 325 MG tablet Take 325 mg by mouth daily.  . citalopram (CELEXA) 20 MG tablet Take 1 tablet (20 mg total) by mouth daily.  . metoprolol tartrate (LOPRESSOR) 25 MG tablet TAKE 1 TABLET BY MOUTH TWICE A DAY  . pravastatin (PRAVACHOL) 20 MG tablet Take 1 tablet (20 mg total) by mouth daily.   No facility-administered encounter medications on file as of 04/30/2019.      Goals Addressed            This Visit's Progress   . RN- Transportation       Current Barriers:  . Transportation barriers . Cognitive Deficits-Hx of CVA . Mother is patient's caretaker and is elderly unable to drive him far.  Nurse Case Manager Clinical Goal(s):  Marland Kitchen Over the next 30  days, patient will work with [place] to address needs related to Transportation Barriers   Interventions:  .  Patient in to see PCP, ongoing symptoms warranted urgent testing and need for urgent transportation. . Call to  [facility] to assist with transportation needs.  . [place] assisting with urgent referral and also collaborating with careguide.  Marland Kitchen Appointment info: The appointment is at 2:00 tomorrow at [facility] in the [facility]. He has to pick up contrast to drink.  He will have to arrive at 11:45 to drink it at noon and then again at 1.  . Discussed plans with patient for ongoing care management follow up and provided patient with direct contact information for care management team  Patient Self Care Activities:  . Currently UNABLE TO independently drive to medical appointments  Please see past updates related to this goal by clicking on the "Past Updates" button in the selected goal          The care management team will reach out to the patient again over the next 30 days.  The patient has been provided with contact information for the care management team and has been advised to call with any health related questions or concerns.    Merlene Morse Coren Sagan RN, BSN Nurse Case Editor, commissioning [facility] Care [facility]  [phone]) Business Mobile

## 2019-04-30 NOTE — Progress Notes (Addendum)
BP 105/79   Pulse 66   Temp 98.3 F (36.8 C) (Oral)   Ht 5' 10"  (6.314 m)   Wt 147 lb (66.7 kg)   SpO2 94%   BMI 21.09 kg/m    Subjective:    Patient ID: Mark [name], male    DOB: May 11, 1961, 58 y.o.   MRN: [id number]  HPI: [name] is a 58 y.o. male  Chief Complaint  Patient presents with  . Follow-up  . Fatigue   FATIGUE Seen 04/17/19 initially for fatigue.  Labs performed noting elevation in LFT AST/ALT 181/184 with Alk phos 178.  Seen again 04/25/19 and labs improved, however still fatigued: AST/ALT 21/28 with alk phos trending downwards 119. He is scheduled for abdominal ultrasound on Monday.    He has history of drug abuse, last hospitalization for acute hepatitis in 2017. At this time hepatitis labs returned negative.  He denies any drug, alcohol, or Tylenol use.  In past he used cocaine and Percocet use.  Also has history of overdose and use of Percocet, which at this time he denies use of.  He denies abdominal pain, constipation, diarrhea, muscle pain, blood in stool, or distension.  Reports decreased appetite, ate 1/2 biscuit this morning and nothing else today.  States he "just doesn't want to eat", denies N&V with eating.  Denies any depressed mood.  States he is passing bowels once a day without difficulty, no straining.  Is drinking fluids, drinking sweet tea, having one glass every couple hours.  Denies fever, chills, cough, congestion, SOB, or CP.  Does have history of having "part of my bowels removed" about 15 years ago before his stroke.  Is a smoker since age 23, 1 PPD, not interested in quitting.  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 Interim medical history since our last visit reviewed. Allergies and medications reviewed and updated.  Review of Systems  Constitutional: Positive for appetite change, fatigue and unexpected weight change. Negative for activity change, chills, diaphoresis and fever.  Respiratory: Negative for cough, chest tightness, shortness of breath and wheezing.   Cardiovascular: Negative for chest pain, palpitations and leg swelling.  Gastrointestinal: Negative for abdominal distention, abdominal pain, constipation, diarrhea, nausea and vomiting.  Endocrine: Negative for cold intolerance, heat intolerance, polydipsia, polyphagia and polyuria.  Neurological: Negative for dizziness, syncope, weakness, light-headedness, numbness and headaches.  Psychiatric/Behavioral: Negative.     Per HPI unless specifically indicated above     Objective:    BP 105/79   Pulse 66   Temp 98.3 F (36.8 C) (Oral)   Ht 5' 10"  (1.778 m)   Wt 147 lb (66.7 kg)   SpO2 94%   BMI 21.09 kg/m   Wt Readings from Last 3 Encounters:  04/30/19 147 lb (66.7 kg)  02/18/19 160 lb (72.6 kg)  10/07/18 157 lb (71.2 kg)    Physical Exam Vitals signs and nursing note reviewed.  Constitutional:      General: He is awake. He is not in acute distress.    Appearance: He is well-developed. He is ill-appearing.  HENT:     Head: Normocephalic and atraumatic.     Right Ear: Hearing normal. No drainage.     Left Ear: Hearing normal. No drainage.  Eyes:     General: Lids are normal.        Right eye: No  discharge.        Left eye: No discharge.     Conjunctiva/sclera: Conjunctivae normal.     Pupils: Pupils are equal, round, and reactive to light.  Neck:     Musculoskeletal: Normal range of motion and neck supple.     Thyroid: No thyromegaly.     Vascular: No carotid bruit.  Cardiovascular:     Rate and Rhythm: Normal rate and regular rhythm.     Heart sounds: Normal heart sounds, S1 normal and S2 normal. No murmur. No gallop.   Pulmonary:     Effort: Pulmonary effort is normal. No accessory muscle usage or respiratory distress.     Breath sounds: Normal breath sounds.  Abdominal:     General: Bowel sounds are normal.     Palpations: Abdomen is soft. There is no hepatomegaly or splenomegaly.     Tenderness: There is no abdominal tenderness.     Comments: Scarring present along abdomen and small outpouching (possible hernia) to right lower abdominal scar.  Musculoskeletal: Normal range of motion.     Right lower leg: No edema.     Left lower leg: No edema.  Skin:    General: Skin is warm and dry.     Capillary Refill: Capillary refill takes less than 2 seconds.     Findings: No rash.  Neurological:     Mental Status: He is alert and oriented to person, place, and time.     Deep Tendon Reflexes: Reflexes are normal and symmetric.  Psychiatric:        Mood and Affect: Mood normal.        Behavior: Behavior normal. Behavior is cooperative.        Thought Content: Thought content normal.        Judgment: Judgment normal.     Results for orders placed or performed in visit on 04/25/19  Comprehensive metabolic panel  Result Value Ref Range   Glucose 94 65 - 99 mg/dL   BUN 12 6 - 24 mg/dL   Creatinine, Ser 1.12 0.76 - 1.27 mg/dL   GFR calc non Af Amer 72 >59 mL/min/1.73   GFR calc Af Amer 83 >59 mL/min/1.73   BUN/Creatinine Ratio 11 9 - 20   Sodium 137 134 - 144 mmol/L   Potassium 4.7 3.5 - 5.2 mmol/L   Chloride 98 96 - 106 mmol/L   CO2 21 20 - 29 mmol/L   Calcium 9.8 8.7 -  10.2 mg/dL   Total Protein 7.6 6.0 - 8.5 g/dL   Albumin 4.8 3.8 - 4.9 g/dL   Globulin, Total 2.8 1.5 - 4.5 g/dL   Albumin/Globulin Ratio 1.7 1.2 - 2.2   Bilirubin Total 0.5 0.0 - 1.2 mg/dL   Alkaline Phosphatase 119 (H) 39 - 117 IU/L   AST 21 0 - 40 IU/L   ALT 28 0 - 44 IU/L  Drugs of abuse scrn w alc, routine urine  Result Value  Ref Range   Amphetamines, Urine WILL FOLLOW    Barbiturate Quant, Ur WILL FOLLOW    Benzodiazepine Quant, Ur WILL FOLLOW    Cannabinoid Quant, Ur WILL FOLLOW    Cocaine (Metab.) WILL FOLLOW    Opiate Quant, Ur WILL FOLLOW    OPIATES WILL FOLLOW    CODEINE WILL FOLLOW    Codeine GC/MS Conf WILL FOLLOW    MORPHINE WILL FOLLOW    Morphine GC/MS Conf WILL FOLLOW    PCP Quant, Ur WILL FOLLOW    Methadone Screen, Urine WILL FOLLOW    Propoxyphene WILL FOLLOW    Ethanol, Urine WILL FOLLOW   Acetaminophen level  Result Value Ref Range   Acetaminophen (Tylenol), S 15 10 - 30 ug/mL      Assessment & Plan:   Problem List Items Addressed This Visit      Nervous and Auditory   Nicotine dependence, cigarettes, w unsp disorders    Continues to smoke 1 PPD.  I have recommended complete cessation of tobacco use. I have discussed various options available for assistance with tobacco cessation including over the counter methods (Nicotine gum, patch and lozenges). We also discussed prescription options (Chantix, Nicotine Inhaler / Nasal Spray). The patient is not interested in pursuing any prescription tobacco cessation options at this time.  Will obtain lung CT CA screening.        Other   Abnormal weight loss    13 pounds loss since 02/18/19 in patient with fatigue and every day smoker.  Will obtain abdominal CT scan stat, work with CCM team to get transportation for tomorrow if able to get scheduled.  Also discussed placing referral to GI for further assessment and colonoscopy, he has not had screening colonoscopy as of yet.  Both his mom and him agree with this plan  of care.      Relevant Orders   CT Abdomen Pelvis Wo Contrast   Ambulatory referral to Gastroenterology   Fatigue - Primary    Recent AST/ALT return to normal with alk phos trending down.  Will repeat CMP and CBC today + obtain TSH, lipase, amylase, A1C.  With 13 pound weight loss in 2 months and every day smoker, obtain CT scan abdomen and placed referral to GI for further work-up/colonoscopy.  Discussed case with Dr. Wynetta Emery and agrees with POC.      Relevant Orders   Amylase   Lipase   Thyroid Panel With TSH   HgB A1c   Comp Met (CMET)   CBC with Differential/Platelet    Other Visit Diagnoses    Encounter for screening for malignant neoplasm of respiratory organs       Relevant Orders   CT CHEST LUNG CA SCREEN LOW DOSE W/O CM      Time: 25 minutes, >50% spent counseling/or care coordination and discussing plan of care with his mother and him + discussing differentials for work-up  Follow up plan: Return in about 5 days (around 05/05/2019) for Weight loss.

## 2019-04-30 NOTE — Patient Instructions (Signed)
Thank you allowing the Chronic Care Management Team to be a part of your care! It was a pleasure speaking with you today!   CCM (Chronic Care Management) Team   Lynsie Mcwatters RN, BSN Nurse Care Coordinator  [phone]  [facility] PharmD  Clinical Pharmacist  [phone]  Eula Fried LCSW Clinical Social Worker [phone]  Goals Addressed            This Visit's Progress   . RN- Transportation       Current Barriers:  . Transportation barriers . Cognitive Deficits-Hx of CVA . Mother is patient's caretaker and is elderly unable to drive him far.  Nurse Case Manager Clinical Goal(s):  Marland Kitchen Over the next 30  days, patient will work with [place] to address needs related to Transportation Barriers   Interventions:  . Patient in to see PCP, ongoing symptoms warranted urgent testing and need for urgent transportation. . Call to  [facility] to assist with transportation needs.  . [place] assisting with urgent referral and also collaborating with careguide.  Marland Kitchen Appointment info: The appointment is at 2:00 tomorrow at [facility] in the [facility]. He has to pick up contrast to drink.  He will have to arrive at 11:45 to drink it at noon and then again at 1.  . Discussed plans with patient for ongoing care management follow up and provided patient with direct contact information for care management team . Patient with recent rapid weight loss: Ensure samples and coupon given.  Patient Self Care Activities:  . Currently UNABLE TO independently drive to medical appointments  Please see past updates related to this goal by clicking on the "Past Updates" button in the selected goal         The patient verbalized understanding of instructions provided today and declined a print copy of patient instruction materials.   The patient has been provided with contact information for the care management team and has been advised to call with any health related questions or  concerns.

## 2019-04-30 NOTE — Telephone Encounter (Signed)
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 Called pt and spoke with his mother and told her the appt was set up for tomorrow and she stated that she could not take him to the hospital (did not state the reason why) she provided transportation to the [facility] appt on 9/16 She said that her son would call a couple of other family members/friends to see if they could take him.  She asked that I call back in the morning and will know then about transportation, [place] informed me that Jolene told her that the mother doesn't like to drive on the highway that may be a barrier.  Pt needs to be at [facility] by 11:45am in order to take the contrast for his CT Scan at 2:00pm The appointment [facility] at 2:[facility]. He has to pick up contrast to drink. If he waits until tomorrow to drink it, he will have to arrive at 11:45 to drink it at noon and then again at 1.    Will call again tomorrow morning to find out status of transportation from friends/family members. knb

## 2019-04-30 NOTE — Assessment & Plan Note (Addendum)
Recent AST/ALT return to normal with alk phos trending down.  Will repeat CMP and CBC today + obtain TSH, lipase, amylase, A1C.  With 13 pound weight loss in 2 months and every day smoker, obtain CT scan abdomen and placed referral to GI for further work-up/colonoscopy.  Discussed case with Dr. Wynetta Emery and agrees with POC.

## 2019-04-30 NOTE — Assessment & Plan Note (Signed)
13 pounds loss since 02/18/19 in patient with fatigue and every day smoker.  Will obtain abdominal CT scan stat, work with CCM team to get transportation for tomorrow if able to get scheduled.  Also discussed placing referral to GI for further assessment and colonoscopy, he has not had screening colonoscopy as of yet.  Both his mom and him agree with this plan of care.

## 2019-04-30 NOTE — Patient Instructions (Signed)
° °Calorie Counting for Weight Loss °Calories are units of energy. Your body needs a certain amount of calories from food to keep you going throughout the day. When you eat more calories than your body needs, your body stores the extra calories as fat. When you eat fewer calories than your body needs, your body burns fat to get the energy it needs. °Calorie counting means keeping track of how many calories you eat and drink each day. Calorie counting can be helpful if you need to lose weight. If you make sure to eat fewer calories than your body needs, you should lose weight. Ask your health care provider what a healthy weight is for you. °For calorie counting to work, you will need to eat the right number of calories in a day in order to lose a healthy amount of weight per week. A dietitian can help you determine how many calories you need in a day and will give you suggestions on how to reach your calorie goal. °· A healthy amount of weight to lose per week is usually 1-2 lb (0.5-0.9 kg). This usually means that your daily calorie intake should be reduced by 500-750 calories. °· Eating 1,200 - 1,500 calories per day can help most women lose weight. °· Eating 1,500 - 1,800 calories per day can help most men lose weight. °What is my plan? °My goal is to have __________ calories per day. °If I have this many calories per day, I should lose around __________ pounds per week. °What do I need to know about calorie counting? °In order to meet your daily calorie goal, you will need to: °· Find out how many calories are in each food you would like to eat. Try to do this before you eat. °· Decide how much of the food you plan to eat. °· Write down what you ate and how many calories it had. Doing this is called keeping a food log. °To successfully lose weight, it is important to balance calorie counting with a healthy lifestyle that includes regular activity. Aim for 150 minutes of moderate exercise (such as walking) or 75  minutes of vigorous exercise (such as running) each week. °Where do I find calorie information? ° °The number of calories in a food can be found on a Nutrition Facts label. If a food does not have a Nutrition Facts label, try to look up the calories online or ask your dietitian for help. °Remember that calories are listed per serving. If you choose to have more than one serving of a food, you will have to multiply the calories per serving by the amount of servings you plan to eat. For example, the label on a package of bread might say that a serving size is 1 slice and that there are 90 calories in a serving. If you eat 1 slice, you will have eaten 90 calories. If you eat 2 slices, you will have eaten 180 calories. °How do I keep a food log? °Immediately after each meal, record the following information in your food log: °· What you ate. Don't forget to include toppings, sauces, and other extras on the food. °· How much you ate. This can be measured in cups, ounces, or number of items. °· How many calories each food and drink had. °· The total number of calories in the meal. °Keep your food log near you, such as in a small notebook in your pocket, or use a mobile app or website. Some programs will   calculate calories for you and show you how many calories you have left for the day to meet your goal. °What are some calorie counting tips? ° °· Use your calories on foods and drinks that will fill you up and not leave you hungry: °? Some examples of foods that fill you up are nuts and nut butters, vegetables, lean proteins, and high-fiber foods like whole grains. High-fiber foods are foods with more than 5 g fiber per serving. °? Drinks such as sodas, specialty coffee drinks, alcohol, and juices have a lot of calories, yet do not fill you up. °· Eat nutritious foods and avoid empty calories. Empty calories are calories you get from foods or beverages that do not have many vitamins or protein, such as candy, sweets, and  soda. It is better to have a nutritious high-calorie food (such as an avocado) than a food with few nutrients (such as a bag of chips). °· Know how many calories are in the foods you eat most often. This will help you calculate calorie counts faster. °· Pay attention to calories in drinks. Low-calorie drinks include water and unsweetened drinks. °· Pay attention to nutrition labels for "low fat" or "fat free" foods. These foods sometimes have the same amount of calories or more calories than the full fat versions. They also often have added sugar, starch, or salt, to make up for flavor that was removed with the fat. °· Find a way of tracking calories that works for you. Get creative. Try different apps or programs if writing down calories does not work for you. °What are some portion control tips? °· Know how many calories are in a serving. This will help you know how many servings of a certain food you can have. °· Use a measuring cup to measure serving sizes. You could also try weighing out portions on a kitchen scale. With time, you will be able to estimate serving sizes for some foods. °· Take some time to put servings of different foods on your favorite plates, bowls, and cups so you know what a serving looks like. °· Try not to eat straight from a bag or box. Doing this can lead to overeating. Put the amount you would like to eat in a cup or on a plate to make sure you are eating the right portion. °· Use smaller plates, glasses, and bowls to prevent overeating. °· Try not to multitask (for example, watch TV or use your computer) while eating. If it is time to eat, sit down at a table and enjoy your food. This will help you to know when you are full. It will also help you to be aware of what you are eating and how much you are eating. °What are tips for following this plan? °Reading food labels °· Check the calorie count compared to the serving size. The serving size may be smaller than what you are used to  eating. °· Check the source of the calories. Make sure the food you are eating is high in vitamins and protein and low in saturated and trans fats. °Shopping °· Read nutrition labels while you shop. This will help you make healthy decisions before you decide to purchase your food. °· Make a grocery list and stick to it. °Cooking °· Try to cook your favorite foods in a healthier way. For example, try baking instead of frying. °· Use low-fat dairy products. °Meal planning °· Use more fruits and vegetables. Half of your plate should be   fruits and vegetables. °· Include lean proteins like poultry and fish. °How do I count calories when eating out? °· Ask for smaller portion sizes. °· Consider sharing an entree and sides instead of getting your own entree. °· If you get your own entree, eat only half. Ask for a box at the beginning of your meal and put the rest of your entree in it so you are not tempted to eat it. °· If calories are listed on the menu, choose the lower calorie options. °· Choose dishes that include vegetables, fruits, whole grains, low-fat dairy products, and lean protein. °· Choose items that are boiled, broiled, grilled, or steamed. Stay away from items that are buttered, battered, fried, or served with cream sauce. Items labeled "crispy" are usually fried, unless stated otherwise. °· Choose water, low-fat milk, unsweetened iced tea, or other drinks without added sugar. If you want an alcoholic beverage, choose a lower calorie option such as a glass of wine or light beer. °· Ask for dressings, sauces, and syrups on the side. These are usually high in calories, so you should limit the amount you eat. °· If you want a salad, choose a garden salad and ask for grilled meats. Avoid extra toppings like bacon, cheese, or fried items. Ask for the dressing on the side, or ask for olive oil and vinegar or lemon to use as dressing. °· Estimate how many servings of a food you are given. For example, a serving of  cooked rice is ½ cup or about the size of half a baseball. Knowing serving sizes will help you be aware of how much food you are eating at restaurants. The list below tells you how big or small some common portion sizes are based on everyday objects: °? 1 oz--4 stacked dice. °? 3 oz--1 deck of cards. °? 1 tsp--1 die. °? 1 Tbsp--½ a ping-pong ball. °? 2 Tbsp--1 ping-pong ball. °? ½ cup--½ baseball. °? 1 cup--1 baseball. °Summary °· Calorie counting means keeping track of how many calories you eat and drink each day. If you eat fewer calories than your body needs, you should lose weight. °· A healthy amount of weight to lose per week is usually 1-2 lb (0.5-0.9 kg). This usually means reducing your daily calorie intake by 500-750 calories. °· The number of calories in a food can be found on a Nutrition Facts label. If a food does not have a Nutrition Facts label, try to look up the calories online or ask your dietitian for help. °· Use your calories on foods and drinks that will fill you up, and not on foods and drinks that will leave you hungry. °· Use smaller plates, glasses, and bowls to prevent overeating. °This information is not intended to replace advice given to you by your health care provider. Make sure you discuss any questions you have with your health care provider. °Document Released: 07/31/2005 Document Revised: 04/19/2018 Document Reviewed: 06/30/2016 °Elsevier Patient Education © 2020 Elsevier Inc. ° °

## 2019-04-30 NOTE — Assessment & Plan Note (Signed)
Continues to smoke 1 PPD.  I have recommended complete cessation of tobacco use. I have discussed various options available for assistance with tobacco cessation including over the counter methods (Nicotine gum, patch and lozenges). We also discussed prescription options (Chantix, Nicotine Inhaler / Nasal Spray). The patient is not interested in pursuing any prescription tobacco cessation options at this time.  Will obtain lung CT CA screening.

## 2019-05-01 ENCOUNTER — Ambulatory Visit: Payer: Medicare HMO

## 2019-05-01 LAB — COMPREHENSIVE METABOLIC PANEL
ALT: 32 IU/L (ref 0–44)
AST: 34 IU/L (ref 0–40)
Albumin/Globulin Ratio: 1.7 (ref 1.2–2.2)
Albumin: 4.8 g/dL (ref 3.8–4.9)
Alkaline Phosphatase: 131 IU/L — ABNORMAL HIGH (ref 39–117)
BUN/Creatinine Ratio: 10 (ref 9–20)
BUN: 11 mg/dL (ref 6–24)
Bilirubin Total: 0.5 mg/dL (ref 0.0–1.2)
CO2: 23 mmol/L (ref 20–29)
Calcium: 10.3 mg/dL — ABNORMAL HIGH (ref 8.7–10.2)
Chloride: 102 mmol/L (ref 96–106)
Creatinine, Ser: 1.15 mg/dL (ref 0.76–1.27)
GFR calc Af Amer: 80 mL/min/{1.73_m2} (ref >59)
GFR calc non Af Amer: 69 mL/min/{1.73_m2} (ref >59)
Globulin, Total: 2.9 g/dL (ref 1.5–4.5)
Glucose: 99 mg/dL (ref 65–99)
Potassium: 5.3 mmol/L — ABNORMAL HIGH (ref 3.5–5.2)
Sodium: 141 mmol/L (ref 134–144)
Total Protein: 7.7 g/dL (ref 6.0–8.5)

## 2019-05-01 LAB — CBC WITH DIFFERENTIAL/PLATELET
Basophils Absolute: 0.1 10*3/uL (ref 0.0–0.2)
Basos: 1 %
EOS (ABSOLUTE): 0.1 10*3/uL (ref 0.0–0.4)
Eos: 1 %
Hematocrit: 45.4 % (ref 37.5–51.0)
Hemoglobin: 15.4 g/dL (ref 13.0–17.7)
Immature Grans (Abs): 0 10*3/uL (ref 0.0–0.1)
Immature Granulocytes: 0 %
Lymphocytes Absolute: 2.9 10*3/uL (ref 0.7–3.1)
Lymphs: 34 %
MCH: 30.9 pg (ref 26.6–33.0)
MCHC: 33.9 g/dL (ref 31.5–35.7)
MCV: 91 fL (ref 79–97)
Monocytes Absolute: 0.6 10*3/uL (ref 0.1–0.9)
Monocytes: 8 %
Neutrophils Absolute: 4.6 10*3/uL (ref 1.4–7.0)
Neutrophils: 56 %
Platelets: 419 10*3/uL (ref 150–450)
RBC: 4.98 x10E6/uL (ref 4.14–5.80)
RDW: 12.6 % (ref 11.6–15.4)
WBC: 8.3 10*3/uL (ref 3.4–10.8)

## 2019-05-01 LAB — URINE DRUGS OF ABUSE SCREEN W ALC, ROUTINE (REF LAB)
Amphetamines, Urine: NEGATIVE ng/mL
Barbiturate Quant, Ur: NEGATIVE ng/mL
Benzodiazepine Quant, Ur: NEGATIVE ng/mL
Cannabinoid Quant, Ur: NEGATIVE ng/mL
Cocaine (Metab.): NEGATIVE ng/mL
Ethanol, Urine: NEGATIVE %
Methadone Screen, Urine: NEGATIVE ng/mL
PCP Quant, Ur: NEGATIVE ng/mL
Propoxyphene: NEGATIVE ng/mL

## 2019-05-01 LAB — AMYLASE: Amylase: 103 U/L (ref 31–110)

## 2019-05-01 LAB — THYROID PANEL WITH TSH
Free Thyroxine Index: 2.1 (ref 1.2–4.9)
T3 Uptake Ratio: 25 % (ref 24–39)
T4, Total: 8.2 ug/dL (ref 4.5–12.0)
TSH: 2.24 u[IU]/mL (ref 0.450–4.500)

## 2019-05-01 LAB — HEMOGLOBIN A1C
Est. average glucose Bld gHb Est-mCnc: 111 mg/dL
Hgb A1c MFr Bld: 5.5 % (ref 4.8–5.6)

## 2019-05-01 LAB — LIPASE: Lipase: 111 U/L — ABNORMAL HIGH (ref 13–78)

## 2019-05-01 LAB — OPIATES CONFIRMATION, URINE: OPIATES: NEGATIVE

## 2019-05-01 NOTE — Telephone Encounter (Signed)
9/17 PEC message to CMA: The appointment is rescheduled for 05/08/19 @ 1 with a 12:45 arrival. He needs to pick up his contrast the day before. appointment is at the [facility] I called the patient about his appointment. I gave the information to his mother. She stated she needs someone to take him and to pick up his contrast. She is expecting a call back from your office . D. Leafy Ro   I left message for pt and mother today at 10:57 to call me back to confirm transportation,  [facility] has agreed to pay for the [place] ride for Mark [name] to pick up his contrast on 9/23 called [facility] and they confirmed that he can come anytime that day between 8 - 5am and pick up contrast.  Will arrange [employer] for 9/25 CT scan and give details to Mark [name] and his mother so they are aware.  Will c/b pt later today. KNB

## 2019-05-02 ENCOUNTER — Other Ambulatory Visit: Payer: Self-pay | Admitting: *Deleted

## 2019-05-02 ENCOUNTER — Encounter: Payer: Self-pay | Admitting: *Deleted

## 2019-05-02 NOTE — Addendum Note (Signed)
Addended by: Marnee Guarneri T on: 05/02/2019 10:10 AM   Modules accepted: Orders

## 2019-05-02 NOTE — Addendum Note (Signed)
Addended by: Marnee Guarneri T on: 05/02/2019 10:12 AM   Modules accepted: Orders

## 2019-05-02 NOTE — Telephone Encounter (Signed)
Email thread regarding 9/23 contrast pick up  From: Chauncey Mann @[email or web address]>  Sent: Friday, May 02, 2019 10:55 AM To: Lavell Islam @[email or web address]> Cc: [name] [facility]) @[place] .[email or web address]>; Eula Fried @[email or web address]>; Villarreal, Tvell @[email or web address] .[email or web address]> Subject: RE: [name] Request MRN: [id number]  Orlando Penner,   I will have one of my Transportation Coordinators, Willa Rough, reach out to [employer] to see if we are able to assist this patient with transportation.  Thank you for reaching out.    We promote equitable health outcomes for our community by utilizing proactive and innovative care plan interventions focused on removing barriers of access and transportation.  From: Lavell Islam @[place] .[email or web address]>  Sent: Friday, May 02, 2019 9:58 AM To: Chauncey Mann @[place] .com> Cc: [name] [facility]) @[place] .[email or web address]>; Eula Fried @[place] .[email or web address]> Subject: FW: [name] Request MRN: [id number]  Good morning Sharyn Lull,  I hope this finds you well! I wanted to see if the below request from Curt Bears is something the Transportation department is able to assist with.  I appreciate any insight or advice you can give on the matter!   [place] [facility] Phone:[phone] Email: [email or web address]    From: [name] [facility]) @[place] .com>  Sent: Friday, May 02, 2019 9:44 AM To: Lavell Islam @[place] .[email or web address]> Cc: Eula Fried @[place] .com> Subject: [name] Request MRN: [id number]  Good Morning Pattricia Boss asked that I reach out to you regarding a patient at [facility] that will need [place] [facility] Transportation to pick up contrast  the day before his CT  scan.  Melburn Popper Info ROUND TRIP:  Date: 05/07/2019 Time: 10:30am  Pick up Address: Filip Luten  [address] [place] [address] [phone]  Destination: [facility] [address] [place],  [place]  [address] Main: [phone]  He will need to walk in and pick up the contrast and then he will be able to come home immediately so if you could schedule the driver to wait in the parking lot for a brief period, that would be great.   Please let me know if you have any other questions. Will you also pass along confirmation information and Ill let the patients mother know as well?  Thank you!  [place]  ??[email or web address]   ??[phone]   ?Skype

## 2019-05-02 NOTE — Telephone Encounter (Signed)
Cancelled reservation for 9/21 created new reservation for 9/24 Confirmation # [id number] 11:45am pick up  Pt to call for pick up after appt [phone]  Patient has 22 remaining trips available (11 round trips)

## 2019-05-02 NOTE — Telephone Encounter (Signed)
° ° °  Spoke to patients mother she confirmed that 10:30am on 9/23 will work to have [place] car pick up Mark [name] to go get the contrast from [facility]. She wrote down times for 9/23 and 9/24 appts will call back with confirmations. [place]  ??[email or web address]  ?? [phone]   Skype

## 2019-05-05 ENCOUNTER — Ambulatory Visit: Payer: Medicare HMO

## 2019-05-05 NOTE — Telephone Encounter (Signed)
°  From: [name] [facility])  Sent: Monday, May 05, 2019 9:05 AM To: Willa Rough @[email or web address]>; Chauncey Mann @[place] .[email or web address]>; Lavell Islam @[place] .[email or web address]> Cc: Eula Fried @[place] .[email or web address]> Subject: RE: [name] Request MRN: [id number]  Tvell, Thanks for confirming the transportation for [name] I will reach out this morning to let the pt know to be ready for pick up at 10:30am on Wednesday 9/23 and the driver will wait for him to pick up contrast from [facility] and take him right back home.  Have a great Monday!  Thanks, Mark [name]  From: Eula Fried @[place] .[email or web address]>  Sent: Friday, May 02, 2019 11:47 AM To: [name] [facility]) @[place] .com>; Villarreal, Tvell @[email or web address] .com>; Chauncey Mann @[place] .com>; Lavell Islam @[place] .com>; Janalyn Shy @[place] .com> Subject: Re: [name] Request MRN: [id number]  [place]   From: Villarreal, Milas Kocher @[place] .com>  Sent: Friday, May 02, 2019 11:45 AM To: [name] [facility]) @[place] .com>; Chauncey Mann @[place] .com>; Lavell Islam @[place] .com> Cc: Eula Fried @[place] .com> Subject: RE: [name] Request MRN: [id number]  What is your cost center Mark [name]?  Thanks   [place] Direct Dial: [phone]   Cell: [phone] Website: [email or web address]   From: [name] [facility]) @[email or web address]>  Sent: Friday, May 02, 2019 11:29 AM To: Chauncey Mann @[place] .com>; Lavell Islam @[place] .com>; Villarreal, Tvell @[place] .com> Cc: Eula Fried @[place] .com> Subject: RE:  [name] Request MRN: [id number]  @[place] , Tvell My work is [phone] thank you!   Thanks, Mark [name]

## 2019-05-06 NOTE — Telephone Encounter (Signed)
Spoke with pt's mother about Ultrasound sched incorrectly at [facility] cancelled. Confirmed appt 10:30am transportation pick up on 9/23 for Contrast pick up  Confirmed appt 11:45am transportation pick up for 9/24 for CT Scan

## 2019-05-07 ENCOUNTER — Ambulatory Visit: Payer: Medicare HMO

## 2019-05-08 ENCOUNTER — Other Ambulatory Visit: Payer: Self-pay

## 2019-05-08 ENCOUNTER — Ambulatory Visit
Admission: RE | Admit: 2019-05-08 | Discharge: 2019-05-08 | Disposition: A | Discharge status: Home / Self Care | Payer: Medicare HMO | Source: Ambulatory Visit | Attending: Nurse Practitioner | Admitting: Nurse Practitioner

## 2019-05-08 DIAGNOSIS — R634 Abnormal weight loss: Secondary | ICD-10-CM | POA: Insufficient documentation

## 2019-05-08 DIAGNOSIS — N132 Hydronephrosis with renal and ureteral calculous obstruction: Secondary | ICD-10-CM | POA: Diagnosis not present

## 2019-05-08 DIAGNOSIS — J432 Centrilobular emphysema: Secondary | ICD-10-CM | POA: Insufficient documentation

## 2019-05-08 DIAGNOSIS — R911 Solitary pulmonary nodule: Secondary | ICD-10-CM | POA: Diagnosis not present

## 2019-05-08 DIAGNOSIS — R6889 Other general symptoms and signs: Secondary | ICD-10-CM | POA: Diagnosis not present

## 2019-05-09 ENCOUNTER — Ambulatory Visit: Payer: Self-pay | Admitting: *Deleted

## 2019-05-09 ENCOUNTER — Encounter: Payer: Self-pay | Admitting: Nurse Practitioner

## 2019-05-09 ENCOUNTER — Ambulatory Visit (INDEPENDENT_AMBULATORY_CARE_PROVIDER_SITE_OTHER): Payer: Medicare HMO | Admitting: Nurse Practitioner

## 2019-05-09 ENCOUNTER — Telehealth: Payer: Self-pay | Admitting: Nurse Practitioner

## 2019-05-09 VITALS — BP 139/89 | HR 76 | Temp 98.1°F | Wt 147.8 lb

## 2019-05-09 DIAGNOSIS — N200 Calculus of kidney: Secondary | ICD-10-CM | POA: Diagnosis not present

## 2019-05-09 DIAGNOSIS — J438 Other emphysema: Secondary | ICD-10-CM | POA: Insufficient documentation

## 2019-05-09 DIAGNOSIS — J432 Centrilobular emphysema: Secondary | ICD-10-CM

## 2019-05-09 DIAGNOSIS — F17219 Nicotine dependence, cigarettes, with unspecified nicotine-induced disorders: Secondary | ICD-10-CM

## 2019-05-09 DIAGNOSIS — R634 Abnormal weight loss: Secondary | ICD-10-CM

## 2019-05-09 DIAGNOSIS — R911 Solitary pulmonary nodule: Secondary | ICD-10-CM | POA: Diagnosis not present

## 2019-05-09 DIAGNOSIS — Z87442 Personal history of urinary calculi: Secondary | ICD-10-CM | POA: Insufficient documentation

## 2019-05-09 DIAGNOSIS — J439 Emphysema, unspecified: Secondary | ICD-10-CM | POA: Insufficient documentation

## 2019-05-09 LAB — MICROSCOPIC EXAMINATION
Bacteria, UA: NONE SEEN
RBC, Urine: >30 /hpf — AB (ref 0–2)
WBC, UA: NONE SEEN /hpf (ref 0–5)

## 2019-05-09 LAB — UA/M W/RFLX CULTURE, ROUTINE
Bilirubin, UA: NEGATIVE
Glucose, UA: NEGATIVE
Leukocytes,UA: NEGATIVE
Nitrite, UA: NEGATIVE
Specific Gravity, UA: 1.015 (ref 1.005–1.030)
Urobilinogen, Ur: 0.2 mg/dL (ref 0.2–1.0)
pH, UA: 5.5 (ref 5.0–7.5)

## 2019-05-09 MED ORDER — TAMSULOSIN HCL 0.4 MG PO CAPS: 0.4000 mg | ORAL_CAPSULE | Freq: Every day | ORAL | 3 refills | Status: DC

## 2019-05-09 MED ORDER — CYCLOBENZAPRINE HCL 10 MG PO TABS: 10.0000 mg | ORAL_TABLET | Freq: Three times a day (TID) | ORAL | 0 refills | Status: DC | PRN

## 2019-05-09 NOTE — Assessment & Plan Note (Addendum)
Maintaining weight today, no further loss.  Continue to monitor closely.  Has GI referral in place for colonoscopy.  Has been drinking Ensure TID.

## 2019-05-09 NOTE — Assessment & Plan Note (Signed)
Noted on CT, will reach out to lung CA screening team to ensure patient placed on their schedule for yearly follow-up.  Long time smoker.  Have recommended complete cessation.

## 2019-05-09 NOTE — Assessment & Plan Note (Addendum)
Noted on CT, UA today with 3+ blood and ketones.  Will initiate Flomax and Flexeril, scripts sent.  Urgent urology referral placed.  Has history of similar.  Have recommend increasing fluid intake at home.  Discussed with his mother and him if worsening symptoms over weekend they are immediately to take him to ER for further evaluation and treatment.  Obtain CMP today.  Follow-up 2 weeks.

## 2019-05-09 NOTE — Assessment & Plan Note (Signed)
I have recommended complete cessation of tobacco use. I have discussed various options available for assistance with tobacco cessation including over the counter methods (Nicotine gum, patch and lozenges). We also discussed prescription options (Chantix, Nicotine Inhaler / Nasal Spray). The patient is not interested in pursuing any prescription tobacco cessation options at this time.  

## 2019-05-09 NOTE — Patient Instructions (Signed)
Flank Pain, Adult Flank pain is pain in your side. The flank is the area of your side between your upper belly (abdomen) and your back. The pain may occur over a short time (acute), or it may be long-term or come back often (chronic). It may be mild or very bad. Pain in this area can be caused by many different things. Follow these instructions at home:   Drink enough fluid to keep your pee (urine) clear or pale yellow.  Rest as told by your doctor.  Take over-the-counter and prescription medicines only as told by your doctor.  Keep a journal to keep track of: ? What has caused your flank pain. ? What has made it feel better.  Keep all follow-up visits as told by your doctor. This is important. Contact a doctor if:  Medicine does not help your pain.  You have new symptoms.  Your pain gets worse.  You have a fever.  Your symptoms last longer than 2-3 days.  You have trouble peeing.  You are peeing more often than normal. Get help right away if:  You have trouble breathing.  You are short of breath.  Your belly hurts, or it is swollen or red.  You feel sick to your stomach (nauseous).  You throw up (vomit).  You feel like you will pass out, or you do pass out (faint).  You have blood in your pee. Summary  Flank pain is pain in your side. The flank is the area of your side between your upper belly (abdomen) and your back.  Flank pain may occur over a short time (acute), or it may be long-term or come back often (chronic). It may be mild or very bad.  Pain in this area can be caused by many different things.  Contact your doctor if your symptoms get worse or they last longer than 2-3 days. This information is not intended to replace advice given to you by your health care provider. Make sure you discuss any questions you have with your health care provider. Document Released: 05/09/2008 Document Revised: 07/13/2017 Document Reviewed: 11/20/2016 Elsevier Patient  Education  2020 Elsevier Inc.  

## 2019-05-09 NOTE — Assessment & Plan Note (Signed)
Chronic, no current inhalers or symptoms.  Long time smoker.  Have recommended complete cessation.  Will plan on spirometry at upcoming visits.  Continue yearly lung CT CA screening.  Initiate inhaler regimen as needed.

## 2019-05-09 NOTE — Progress Notes (Addendum)
BP 139/89    Pulse 76    Temp 98.1 F (36.7 C) (Oral)    Wt 147 lb 12.8 oz (67 kg)    SpO2 95%    BMI 21.21 kg/m    Subjective:    Patient ID: Mark [name], male    DOB: 01-Feb-1961, 57 y.o.   MRN: [id number]  HPI: Mark [name] is a 58 y.o. male  Chief Complaint  Patient presents with   Follow-up   FATIGUE  Seen 04/17/19 initially for fatigue, at this time he reports this has improved but just does not feel good. Labs performed noting elevation in LFT AST/ALT 181/184 with Alk phos 178. Seen again 04/25/19 and labs improved, however still fatigued: AST/ALT 21/28 with alk phos trending downwards 119. Recent CT scan abdomen and lungs.  No malignant findings.  Did note 1.1 cm proximal right ureteral calculus which is causing moderate hydronephrosis and perinephric stranding.  Has had kidney stones twice with lithotripsy x 1 many years ago.  CT scans also noted: - multiple non obstructing right renal calculi - 3 mm pulmonary nodule in left upper lobe  - centrilobular emphysema in upper lungs  He has history of drug abuse, last hospitalization for acute hepatitis in 2017. At this time hepatitis labs returned negative. He denies any drug, alcohol, or Tylenol use. In past he used cocaine and Percocet use.  Also has history of overdose and use of Percocet, which at this time he denies use of. He denies abdominal pain, constipation, diarrhea, muscle pain, blood in stool, or distension.  Reports decreased appetite, denies N&V with eating.  Denies any depressed mood.  States he is passing bowels once a day without difficulty, no straining.  Is drinking fluids, drinking sweet tea, having one glass every couple hours.  Denies fever, chills, cough, congestion, SOB, or CP.  Today he does endorse back pain R>L, denies blood in urine.  Is a smoker since age 26, 1 PPD, not interested in quitting.  Of note on review records: 2006 CT pelvis == right hepatic lobe lesion (stable per report at that  time) -- 1.4 x 1.2 cm  --- history of short gut syndrome and right hemicolectomy (multiple surgeries)  Duration:weeks, several weeks ago Severity:moderate Onset:sudden Context when symptoms started:none Symptoms improve with rest:no Depressive symptoms:no Stress/anxiety:no Insomnia:none Snoring:no Observed apnea by bed partner:no Daytime hypersomnolence:no Wakes feeling refreshed:no History of sleep study:no Dysnea on exertion:no Orthopnea/PND:no Chest pain:no Chronic cough:no Lower extremity edema:no Arthralgias:no Myalgias:no Weakness:no Rash:no  Relevant past medical, surgical, family and social history reviewed and updated as indicated. Interim medical history since our last visit reviewed. Allergies and medications reviewed and updated.  Review of Systems  Constitutional: Negative for activity change, diaphoresis, fatigue and fever.  Respiratory: Negative for cough, chest tightness, shortness of breath and wheezing.   Cardiovascular: Negative for chest pain, palpitations and leg swelling.  Gastrointestinal: Negative for abdominal distention, abdominal pain, constipation, diarrhea, nausea and vomiting.  Genitourinary: Negative for decreased urine volume, dysuria, hematuria and urgency.  Musculoskeletal: Positive for back pain.  Neurological: Negative for dizziness, syncope, weakness, light-headedness, numbness and headaches.  Psychiatric/Behavioral: Negative.     Per HPI unless specifically indicated above     Objective:    BP 139/89    Pulse 76    Temp 98.1 F (36.7 C) (Oral)    Wt 147 lb 12.8 oz (67 kg)    SpO2 95%    BMI 21.21 kg/m   Wt Readings from  Last 3 Encounters:  05/09/19 147 lb 12.8 oz (67 kg)  04/30/19 147 lb (66.7 kg)  02/18/19 160 lb (72.6 kg)    Physical Exam Vitals signs and nursing note reviewed.  Constitutional:      General: He is awake. He is not in acute distress.    Appearance: He is well-developed. He is not  ill-appearing.     Comments: Appearance improved on exam today.  HENT:     Head: Normocephalic and atraumatic.     Right Ear: Hearing normal. No drainage.     Left Ear: Hearing normal. No drainage.  Eyes:     General: Lids are normal.        Right eye: No discharge.        Left eye: No discharge.     Conjunctiva/sclera: Conjunctivae normal.     Pupils: Pupils are equal, round, and reactive to light.  Neck:     Musculoskeletal: Normal range of motion and neck supple.     Thyroid: No thyromegaly.     Vascular: No carotid bruit or JVD.     Trachea: Trachea normal.  Cardiovascular:     Rate and Rhythm: Normal rate and regular rhythm.     Heart sounds: Normal heart sounds, S1 normal and S2 normal. No murmur. No gallop.   Pulmonary:     Effort: Pulmonary effort is normal. No accessory muscle usage or respiratory distress.     Breath sounds: Normal breath sounds.  Abdominal:     General: Bowel sounds are normal.     Palpations: Abdomen is soft. There is no hepatomegaly or splenomegaly.     Tenderness: There is no abdominal tenderness. There is no right CVA tenderness or left CVA tenderness.  Musculoskeletal: Normal range of motion.     Right lower leg: No edema.     Left lower leg: No edema.  Skin:    General: Skin is warm and dry.  Neurological:     Mental Status: He is alert and oriented to person, place, and time.  Psychiatric:        Mood and Affect: Mood normal.        Behavior: Behavior normal. Behavior is cooperative.        Thought Content: Thought content normal.        Judgment: Judgment normal.     Results for orders placed or performed in visit on 04/30/19  Amylase  Result Value Ref Range   Amylase 103 31 - 110 U/L  Lipase  Result Value Ref Range   Lipase 111 (H) 13 - 78 U/L  Thyroid Panel With TSH  Result Value Ref Range   TSH 2.240 0.450 - 4.500 uIU/mL   T4, Total 8.2 4.5 - 12.0 ug/dL   T3 Uptake Ratio 25 24 - 39 %   Free Thyroxine Index 2.1 1.2 - 4.9    HgB A1c  Result Value Ref Range   Hgb A1c MFr Bld 5.5 4.8 - 5.6 %   Est. average glucose Bld gHb Est-mCnc 111 mg/dL  Comp Met (CMET)  Result Value Ref Range   Glucose 99 65 - 99 mg/dL   BUN 11 6 - 24 mg/dL   Creatinine, Ser 1.15 0.76 - 1.27 mg/dL   GFR calc non Af Amer 69 >59 mL/min/1.73   GFR calc Af Amer 80 >59 mL/min/1.73   BUN/Creatinine Ratio 10 9 - 20   Sodium 141 134 - 144 mmol/L   Potassium 5.3 (H) 3.5 - 5.2 mmol/L  Chloride 102 96 - 106 mmol/L   CO2 23 20 - 29 mmol/L   Calcium 10.3 (H) 8.7 - 10.2 mg/dL   Total Protein 7.7 6.0 - 8.5 g/dL   Albumin 4.8 3.8 - 4.9 g/dL   Globulin, Total 2.9 1.5 - 4.5 g/dL   Albumin/Globulin Ratio 1.7 1.2 - 2.2   Bilirubin Total 0.5 0.0 - 1.2 mg/dL   Alkaline Phosphatase 131 (H) 39 - 117 IU/L   AST 34 0 - 40 IU/L   ALT 32 0 - 44 IU/L  CBC with Differential/Platelet  Result Value Ref Range   WBC 8.3 3.4 - 10.8 x10E3/uL   RBC 4.98 4.14 - 5.80 x10E6/uL   Hemoglobin 15.4 13.0 - 17.7 g/dL   Hematocrit 45.4 37.5 - 51.0 %   MCV 91 79 - 97 fL   MCH 30.9 26.6 - 33.0 pg   MCHC 33.9 31.5 - 35.7 g/dL   RDW 12.6 11.6 - 15.4 %   Platelets 419 150 - 450 x10E3/uL   Neutrophils 56 Not Estab. %   Lymphs 34 Not Estab. %   Monocytes 8 Not Estab. %   Eos 1 Not Estab. %   Basos 1 Not Estab. %   Neutrophils Absolute 4.6 1.4 - 7.0 x10E3/uL   Lymphocytes Absolute 2.9 0.7 - 3.1 x10E3/uL   Monocytes Absolute 0.6 0.1 - 0.9 x10E3/uL   EOS (ABSOLUTE) 0.1 0.0 - 0.4 x10E3/uL   Basophils Absolute 0.1 0.0 - 0.2 x10E3/uL   Immature Granulocytes 0 Not Estab. %   Immature Grans (Abs) 0.0 0.0 - 0.1 x10E3/uL      Assessment & Plan:   Problem List Items Addressed This Visit      Respiratory   Emphysema lung (HCC) - Primary    Chronic, no current inhalers or symptoms.  Long time smoker.  Have recommended complete cessation.  Will plan on spirometry at upcoming visits.  Continue yearly lung CT CA screening.  Initiate inhaler regimen as needed.        Nervous  and Auditory   Nicotine dependence, cigarettes, w unsp disorders    I have recommended complete cessation of tobacco use. I have discussed various options available for assistance with tobacco cessation including over the counter methods (Nicotine gum, patch and lozenges). We also discussed prescription options (Chantix, Nicotine Inhaler / Nasal Spray). The patient is not interested in pursuing any prescription tobacco cessation options at this time.        Genitourinary   Kidney stone    Noted on CT, UA today with 3+ blood and ketones.  Will initiate Flomax and Flexeril, scripts sent.  Urgent urology referral placed.  Has history of similar.  Have recommend increasing fluid intake at home.  Discussed with his mother and him if worsening symptoms over weekend they are immediately to take him to ER for further evaluation and treatment.  Obtain CMP today.  Follow-up 2 weeks.      Relevant Orders   UA/M w/rflx Culture, Routine   Comprehensive metabolic panel   Ambulatory referral to Urology     Other   Abnormal weight loss    Maintaining weight today, no further loss.  Continue to monitor closely.  Has GI referral in place for colonoscopy.  Has been drinking Ensure TID.      Pulmonary nodule    Noted on CT, will reach out to lung CA screening team to ensure patient placed on their schedule for yearly follow-up.  Long time smoker.  Have recommended  complete cessation.        Time: 25 minutes, >50% spent counseling on kidney stones to his mother and him + discussing plan of care for new medications ordered, urology referral, and if worsening symptoms to go to ER immediately  Follow up plan: Return in about 2 weeks (around 05/23/2019) for weight loss and kidney loss.

## 2019-05-09 NOTE — Chronic Care Management (AMB) (Signed)
Chronic Care Management   Follow Up Note   05/09/2019 Name: Mark [name] MRN: [id number] DOB: 11/11/1960  Referred by: Venita Lick, NP Reason for referral : No chief complaint on file.   Mark [name] is a 58 y.o. year old male who is a primary care patient of Cannady, Barbaraann Faster, NP. The CCM team was consulted for assistance with chronic disease management and care coordination needs.    Review of patient status, including review of consultants reports, relevant laboratory and other test results, and collaboration with appropriate care team members and the patient's provider was performed as part of comprehensive patient evaluation and provision of chronic care management services.    SDOH (Social Determinants of Health) screening performed today: Biomedical engineer  Food Insecurity  Depression   Alcohol/Substance Use Tobacco Use Physical Activity. See Care Plan for related entries.   Advanced Directives Status: N See Care Plan and Vynca application for related entries.  Outpatient Encounter Medications as of 05/09/2019  Medication Sig  . amLODipine (NORVASC) 10 MG tablet TAKE 1 TABLET BY MOUTH EVERY DAY  . aspirin 325 MG tablet Take 325 mg by mouth daily.  . citalopram (CELEXA) 20 MG tablet Take 1 tablet (20 mg total) by mouth daily.  . metoprolol tartrate (LOPRESSOR) 25 MG tablet TAKE 1 TABLET BY MOUTH TWICE A DAY  . pravastatin (PRAVACHOL) 20 MG tablet Take 1 tablet (20 mg total) by mouth daily.   No facility-administered encounter medications on file as of 05/09/2019.      Goals Addressed            This Visit's Progress   . RN- Transportation       Current Barriers:  . Transportation barriers . Cognitive Deficits-Hx of CVA . Mother is patient's caretaker and is elderly unable to drive him far.  Nurse Case Manager Clinical Goal(s):  Marland Kitchen Over the next 30  days, patient will work with [place] to address needs related to Transportation  Barriers   Interventions:  . Patient in to see PCP, ongoing symptoms for Kidney stones.  . Message to  [facility] to assist with transportation needs.  . [place] assisting with referral and also collaborating with careguide.  Marland Kitchen Appointment info: Dr. Glena Norfolk Winter 05/12/19 at 10:30 . Phone: [phone] . Address: [address] [place], [place], [place] [address]  . [facility] . Discussed plans with patient for ongoing care management follow up and provided patient with direct contact information for care management team . Patient with recent rapid weight loss: Ensure Max Protein  samples and coupon given. Marland Kitchen SDOH reviewed, patient denies not being able to pay bills or afford food, does say he has a hard time with transportation, continues smoking, not exercising at this time, feels sad at times denies wanting to talk with LCSW or grief counselor at this time.   Patient Self Care Activities:  . Currently UNABLE TO independently drive to medical appointments  Please see past updates related to this goal by clicking on the "Past Updates" button in the selected goal          The care management team will reach out to the patient again over the next 30 days.  The patient has been provided with contact information for the care management team and has been advised to call with any health related questions or concerns.    Sheridan Gettel RN, BSN Nurse Case Editor, commissioning Family Practice/[facility] Care Management  (  [phone]) Business Mobile

## 2019-05-10 LAB — COMPREHENSIVE METABOLIC PANEL
ALT: 21 IU/L (ref 0–44)
AST: 23 IU/L (ref 0–40)
Albumin/Globulin Ratio: 1.6 (ref 1.2–2.2)
Albumin: 4.6 g/dL (ref 3.8–4.9)
Alkaline Phosphatase: 119 IU/L — ABNORMAL HIGH (ref 39–117)
BUN/Creatinine Ratio: 11 (ref 9–20)
BUN: 11 mg/dL (ref 6–24)
Bilirubin Total: 0.5 mg/dL (ref 0.0–1.2)
CO2: 25 mmol/L (ref 20–29)
Calcium: 10.1 mg/dL (ref 8.7–10.2)
Chloride: 100 mmol/L (ref 96–106)
Creatinine, Ser: 0.96 mg/dL (ref 0.76–1.27)
GFR calc Af Amer: 100 mL/min/{1.73_m2} (ref >59)
GFR calc non Af Amer: 86 mL/min/{1.73_m2} (ref >59)
Globulin, Total: 2.9 g/dL (ref 1.5–4.5)
Glucose: 91 mg/dL (ref 65–99)
Potassium: 5.6 mmol/L — ABNORMAL HIGH (ref 3.5–5.2)
Sodium: 137 mmol/L (ref 134–144)
Total Protein: 7.5 g/dL (ref 6.0–8.5)

## 2019-05-11 NOTE — Patient Instructions (Signed)
Thank you allowing the Chronic Care Management Team to be a part of your care! It was a pleasure speaking with you today!   CCM (Chronic Care Management) Team   Abdulwahab Demelo RN, BSN Nurse Care Coordinator  [phone]  Catie [facility] PharmD  Clinical Pharmacist  [phone]  Eula Fried LCSW Clinical Social Worker [phone]  Goals Addressed            This Visit's Progress   . RN- Transportation       Current Barriers:  . Transportation barriers . Cognitive Deficits-Hx of CVA . Mother is patient's caretaker and is elderly unable to drive him far.  Nurse Case Manager Clinical Goal(s):  Marland Kitchen Over the next 30  days, patient will work with [place] to address needs related to Transportation Barriers   Interventions:  . Patient in to see PCP, ongoing symptoms for Kidney stones.  . Message to  [facility] to assist with transportation needs.  . [place] assisting with referral and also collaborating with careguide.  Marland Kitchen Appointment info: Dr. Glena Norfolk Winter 05/12/19 at 10:30 . Phone: [phone] . Address: [address] [place], [place], [place] [address]  . [facility] . Discussed plans with patient for ongoing care management follow up and provided patient with direct contact information for care management team . Patient with recent rapid weight loss: Ensure Max Protein  samples and coupon given. Marland Kitchen SDOH reviewed, patient denies not being able to pay bills or afford food, does say he has a hard time with transportation, continues smoking, not exercising at this time, feels sad at times denies wanting to talk with LCSW or grief counselor at this time.   Patient Self Care Activities:  . Currently UNABLE TO independently drive to medical appointments  Please see past updates related to this goal by clicking on the "Past Updates" button in the selected goal         The patient verbalized understanding of instructions provided today and declined a print copy  of patient instruction materials.   The patient has been provided with contact information for the care management team and has been advised to call with any health related questions or concerns.

## 2019-05-12 ENCOUNTER — Telehealth: Payer: Self-pay | Admitting: Nurse Practitioner

## 2019-05-12 ENCOUNTER — Other Ambulatory Visit: Payer: Self-pay | Admitting: Urology

## 2019-05-12 DIAGNOSIS — N201 Calculus of ureter: Secondary | ICD-10-CM | POA: Diagnosis not present

## 2019-05-12 NOTE — Telephone Encounter (Signed)
Will work with CCM and transportation + urology group, as patient will need transport to surgery.  Lithotripsy appears to have been scheduled today and to be performed 05/15/19 by urology.

## 2019-05-12 NOTE — Telephone Encounter (Signed)
Pt presented in office to find out about transportation to surgery Thursday.

## 2019-05-13 ENCOUNTER — Telehealth: Payer: Self-pay | Admitting: Nurse Practitioner

## 2019-05-13 ENCOUNTER — Ambulatory Visit: Payer: Self-pay | Admitting: *Deleted

## 2019-05-13 DIAGNOSIS — N200 Calculus of kidney: Secondary | ICD-10-CM

## 2019-05-13 NOTE — Patient Instructions (Signed)
Thank you allowing the Chronic Care Management Team to be a part of your care! It was a pleasure speaking with you today!   CCM (Chronic Care Management) Team   Sherry Rogus RN, BSN Nurse Care Coordinator  [phone]  Catie [facility] PharmD  Clinical Pharmacist  [phone]  Eula Fried LCSW Clinical Social Worker [phone]  Goals Addressed            This Visit's Progress   . RN- Transportation (pt-stated)       Current Barriers:  . Transportation barriers . Cognitive Deficits-Hx of CVA . Mother is patient's caretaker and is elderly unable to drive him far.  Nurse Case Manager Clinical Goal(s):  Marland Kitchen Over the next 30  days, patient will work with [place] to address needs related to Transportation Barriers   Interventions: . Phone call and  Joint chart review with care Guide [name] . Transportation requested for Lithotripsy procedure at [place] @ 7:30am on 05/15/19 @ the Ambulatory Surgery center.  Curt Bears working to set up transportation.  Patient Self Care Activities:  . Currently UNABLE TO independently drive to medical appointments  Please see past updates related to this goal by clicking on the "Past Updates" button in the selected goal         The patient verbalized understanding of instructions provided today and declined a print copy of patient instruction materials.   The patient has been provided with contact information for the care management team and has been advised to call with any health related questions or concerns.

## 2019-05-13 NOTE — Telephone Encounter (Signed)
Spoke with Ebony Hail to discuss early morning transportation for Mr. Yandow, he has to be at [employer] at [employer]. She will follow up with [employer] to see what they are capable of and will let me know   From: [name] [facility])  Sent: Tuesday, May 13, 2019 10:04 AM To: [email or web address] Subject: RE: Trip Request MRN: [id number]  Good Morning Ebony Hail, Could you please set up transportation for Mr. Kallum Jorgensen. You can charge it to [place]. Please let me know what you find out regarding [employer] capability to take him this early in the morning.  Info:  Date: 05/15/2019 Time: 7:30 am ARRIVAL TIME 6:00AM  Pick up Address: [facility]  [address] [place] [address] [phone]  Destination: [facility] - Admitting Dr. Marval Regal, MD [place], [place] [address]   Phone: [phone]  Once he is registered they will take him to short stay  Please let me know if you have any questions.  Thanks, Curt Bears

## 2019-05-13 NOTE — Chronic Care Management (AMB) (Signed)
  Chronic Care Management   Follow Up Note   05/13/2019 Name: JUMAR GREENSTREET MRN: [id number] DOB: 1960-12-14  Referred by: Venita Lick, NP Reason for referral : Care Coordination (Transportation )   LEMAR BAKOS is a 58 y.o. year old male who is a primary care patient of Cannady, Barbaraann Faster, NP. The CCM team was consulted for assistance with chronic disease management and care coordination needs.    Review of patient status, including review of consultants reports, relevant laboratory and other test results, and collaboration with appropriate care team members and the patient's provider was performed as part of comprehensive patient evaluation and provision of chronic care management services.    SDOH (Social Determinants of Health) screening performed today: Transportation. See Care Plan for related entries.   Advanced Directives Status: N See Care Plan and Vynca application for related entries.  Outpatient Encounter Medications as of 05/13/2019  Medication Sig  . amLODipine (NORVASC) 10 MG tablet TAKE 1 TABLET BY MOUTH EVERY DAY  . aspirin 325 MG tablet Take 325 mg by mouth daily.  . citalopram (CELEXA) 20 MG tablet Take 1 tablet (20 mg total) by mouth daily.  . cyclobenzaprine (FLEXERIL) 10 MG tablet Take 1 tablet (10 mg total) by mouth 3 (three) times daily as needed for muscle spasms.  . metoprolol tartrate (LOPRESSOR) 25 MG tablet TAKE 1 TABLET BY MOUTH TWICE A DAY  . pravastatin (PRAVACHOL) 20 MG tablet Take 1 tablet (20 mg total) by mouth daily.  . tamsulosin (FLOMAX) 0.4 MG CAPS capsule Take 1 capsule (0.4 mg total) by mouth daily.   No facility-administered encounter medications on file as of 05/13/2019.      Goals Addressed            This Visit's Progress   . RN- Transportation (pt-stated)       Current Barriers:  . Transportation barriers . Cognitive Deficits-Hx of CVA . Mother is patient's caretaker and is elderly unable to drive him far.  Nurse Case  Manager Clinical Goal(s):  Marland Kitchen Over the next 30  days, patient will work with [place] to address needs related to Transportation Barriers   Interventions: . Phone call and  Joint chart review with care Guide [name] . Transportation requested for Lithotripsy procedure at [place] @ 7:30am on 05/15/19 @ the Ambulatory Surgery center.  Curt Bears working to set up transportation.  Patient Self Care Activities:  . Currently UNABLE TO independently drive to medical appointments  Please see past updates related to this goal by clicking on the "Past Updates" button in the selected goal          The patient has been provided with contact information for the care management team and has been advised to call with any health related questions or concerns.     Merlene Morse Jahliyah Trice RN, BSN Nurse Case Editor, commissioning Family Practice/[facility] Care Management  [phone]) Business Mobile

## 2019-05-13 NOTE — Chronic Care Management (AMB) (Signed)
Chronic Care Management   Follow Up Note   05/13/2019 Name: Mark [name] MRN: [id number] DOB: Nov 30, 1960  Referred by: Marjie Skiff, NP Reason for referral : Care Coordination (Transportation )   Mark [name] is a 58 y.o. year old male who is a primary care patient of Cannady, Dorie Rank, NP. The CCM team was consulted for assistance with chronic disease management and care coordination needs.    Review of patient status, including review of consultants reports, relevant laboratory and other test results, and collaboration with appropriate care team members and the patient's provider was performed as part of comprehensive patient evaluation and provision of chronic care management services.    SDOH (Social Determinants of Health) screening performed today: Transportation. See Care Plan for related entries.   Advanced Directives Status: N See Care Plan and Vynca application for related entries.  Outpatient Encounter Medications as of 05/13/2019  Medication Sig  . amLODipine (NORVASC) 10 MG tablet TAKE 1 TABLET BY MOUTH EVERY DAY  . aspirin 325 MG tablet Take 325 mg by mouth daily.  . citalopram (CELEXA) 20 MG tablet Take 1 tablet (20 mg total) by mouth daily.  . cyclobenzaprine (FLEXERIL) 10 MG tablet Take 1 tablet (10 mg total) by mouth 3 (three) times daily as needed for muscle spasms.  . metoprolol tartrate (LOPRESSOR) 25 MG tablet TAKE 1 TABLET BY MOUTH TWICE A DAY  . pravastatin (PRAVACHOL) 20 MG tablet Take 1 tablet (20 mg total) by mouth daily.  . tamsulosin (FLOMAX) 0.4 MG CAPS capsule Take 1 capsule (0.4 mg total) by mouth daily.   No facility-administered encounter medications on file as of 05/13/2019.      Goals Addressed            This Visit's Progress   . RN- Transportation (pt-stated)       Current Barriers:  . Transportation barriers . Cognitive Deficits-Hx of CVA . Mother is patient's caretaker and is elderly unable to drive him far.  Nurse Case  Manager Clinical Goal(s):  Mark [name] Over the next 30  days, patient will work with Care Guide to address needs related to Transportation Barriers   Interventions: . Phone call and  Joint chart review with care Guide Manuela Schwartz . Transportation requested for Lithotripsy procedure at [facility] @ 7:30am on 05/15/19 @ [facility].  Mark [name] working to set up transportation. . Patient andt mother Mark [name] came up to PCP office to make [place] aware of his appointment and to let us know he needed to obtain a "blue folder" before his surgery on Oct 1.  . Placed a call to [facility] at Urology office requesting a call back to inquire about the blue folder and patients transportation barrier.  Mark [name] Spoke with Junious Dresser, she stated this [facility] folder has surgical documents the patient must have when he arrives on 10/1 for surgery.  . Called patient, spoke with mother Mark [name]. Asked permission for [facility] CCM  Pharm D to  pick up the documents from [facility] and have them at [facility] tomorrow AM for them to pick up. Mother agreed to this plan. . Also made mother aware patient does have transportation for 10/1 and for him to be ready at 5am. . Left a VM for Junious Dresser at [facility] patient/mother has given permission for [employer] CCM Pharm D to pick up his blue folder and bring it to [facility] family practice tomorrow.   Patient Self Care Activities:  . Currently UNABLE TO independently drive  to medical appointments  Please see past updates related to this goal by clicking on the "Past Updates" button in the selected goal          The patient has been provided with contact information for the care management team and has been advised to call with any health related questions or concerns.    Merlene Morse Damika Harmon RN, BSN Nurse Case Editor, commissioning Family Practice/[facility] Care Management  [phone]) Business Mobile

## 2019-05-14 ENCOUNTER — Telehealth: Payer: Self-pay | Admitting: Nurse Practitioner

## 2019-05-14 NOTE — Telephone Encounter (Signed)
Left information with Ashok Croon at [phone] regarding transportation for [employer] about Mr. Knechtel's ride home.   IApproximate pick up of 1:30pm.   If he needs to be picked up any earlier or later, the facility can call them at [phone] and choose option 2.   Any other questions, please call me. (patient will not have cellphone in order to arrange transporation for himself home)    [place]  ??[email or web address]   ??[phone]   1Skype

## 2019-05-14 NOTE — Telephone Encounter (Signed)
TRANSPORTATION CONFIRMED, spoke to patients mom she said he will be ready to be picked up tomorrow morning at 5:30 am  From: Ginette Otto [email or web address]>  Sent: Wednesday, May 14, 2019 8:46 AM To: [name] [facility]) @[email or web address]> Subject: Re: Trip Request MRN: [id number]  He will be getting picked up at 5:30am to get him there at 6!  Blessings,  [place] Management Transportation Coordinator, Transportation Services Desk: [phone]    Website: [email or web address]    From: [name] [facility]) [email or web address]> Sent: Wednesday, May 14, 2019 8:32 AM To: Ginette Otto @[email or web address]> Subject: RE: Trip Request MRN: [id number]    Thank you! Did they give a time that they will be picking up Mr. Salih? I will let him know to be ready     Thanks, Curt Bears   From: Ginette Otto @[email or web address]>  Sent: Tuesday, May 13, 2019 5:08 PM To: [name] [facility]) @[email or web address]> Subject: Re: Trip Request MRN: [id number]   It is official!    Blessings,  [place] Management Transportation Coordinator, Transportation Services Desk: [phone]    Website: [email or web address]     From: [name] [facility]) @[email or web address]> Sent: Tuesday, May 13, 2019 4:54 PM To: Ginette Otto @[email or web address]> Subject: RE: Trip Request MRN: [id number]    HI Ebony Hail, any word?       Thanks, Curt Bears   From: Ginette Otto @[email or web address]>  Sent: Tuesday, May 13, 2019 1:16 PM To: [name] [facility]) @ .[email or web address]> Subject: Re: Trip Request MRN: [id number]   Good afternoon Curt Bears,    I just spoke with [employer] and they should be able to transport your patient!    I am waiting on a confirmation email from them!    Blessings,  [place]  Management Transportation Coordinator, Transportation Services Desk: [phone]    Website: [email or web address]

## 2019-05-15 ENCOUNTER — Encounter (HOSPITAL_COMMUNITY): Payer: Self-pay | Admitting: General Practice

## 2019-05-15 ENCOUNTER — Ambulatory Visit (HOSPITAL_COMMUNITY)
Admission: RE | Admit: 2019-05-15 | Discharge: 2019-05-15 | Disposition: A | Discharge status: Home / Self Care | Payer: Medicare HMO | Source: Ambulatory Visit | Attending: Urology | Admitting: Urology

## 2019-05-15 ENCOUNTER — Other Ambulatory Visit: Payer: Self-pay | Admitting: Nurse Practitioner

## 2019-05-15 ENCOUNTER — Ambulatory Visit (HOSPITAL_COMMUNITY): Payer: Medicare HMO

## 2019-05-15 ENCOUNTER — Encounter (HOSPITAL_COMMUNITY)
Admission: RE | Disposition: A | Discharge status: Home / Self Care | Payer: Self-pay | Source: Ambulatory Visit | Attending: Urology

## 2019-05-15 DIAGNOSIS — N202 Calculus of kidney with calculus of ureter: Secondary | ICD-10-CM | POA: Diagnosis present

## 2019-05-15 DIAGNOSIS — N201 Calculus of ureter: Secondary | ICD-10-CM | POA: Diagnosis not present

## 2019-05-15 DIAGNOSIS — N200 Calculus of kidney: Secondary | ICD-10-CM | POA: Insufficient documentation

## 2019-05-15 DIAGNOSIS — Z01818 Encounter for other preprocedural examination: Secondary | ICD-10-CM | POA: Diagnosis not present

## 2019-05-15 DIAGNOSIS — I10 Essential (primary) hypertension: Secondary | ICD-10-CM | POA: Insufficient documentation

## 2019-05-15 DIAGNOSIS — Z79899 Other long term (current) drug therapy: Secondary | ICD-10-CM | POA: Diagnosis not present

## 2019-05-15 DIAGNOSIS — Z8673 Personal history of transient ischemic attack (TIA), and cerebral infarction without residual deficits: Secondary | ICD-10-CM | POA: Insufficient documentation

## 2019-05-15 DIAGNOSIS — F17200 Nicotine dependence, unspecified, uncomplicated: Secondary | ICD-10-CM | POA: Diagnosis not present

## 2019-05-15 HISTORY — PX: EXTRACORPOREAL SHOCK WAVE LITHOTRIPSY: SHX1557

## 2019-05-15 SURGERY — LITHOTRIPSY, ESWL
Anesthesia: LOCAL | Laterality: Right

## 2019-05-15 MED ORDER — OXYCODONE-ACETAMINOPHEN 10-325 MG PO TABS: 1.0000 | ORAL_TABLET | ORAL | 0 refills | Status: DC | PRN

## 2019-05-15 MED ORDER — TAMSULOSIN HCL 0.4 MG PO CAPS: 0.4000 mg | ORAL_CAPSULE | Freq: Every day | ORAL | 0 refills | Status: DC

## 2019-05-15 MED ORDER — ONDANSETRON HCL 4 MG PO TABS: 4.0000 mg | ORAL_TABLET | Freq: Every day | ORAL | 1 refills | Status: DC | PRN

## 2019-05-15 MED ORDER — SODIUM CHLORIDE 0.9 % IV SOLN
INTRAVENOUS | Status: DC
  Administered 2019-05-15: 07:00:00 via INTRAVENOUS

## 2019-05-15 MED ORDER — DIPHENHYDRAMINE HCL 25 MG PO CAPS
25.0000 mg | ORAL_CAPSULE | ORAL | Status: AC
  Administered 2019-05-15: 25 mg via ORAL
  Filled 2019-05-15: qty 1

## 2019-05-15 MED ORDER — CIPROFLOXACIN HCL 500 MG PO TABS
500.0000 mg | ORAL_TABLET | ORAL | Status: AC
  Administered 2019-05-15: 07:00:00 500 mg via ORAL
  Filled 2019-05-15: qty 1

## 2019-05-15 MED ORDER — DIAZEPAM 5 MG PO TABS
10.0000 mg | ORAL_TABLET | ORAL | Status: AC
  Administered 2019-05-15: 10 mg via ORAL
  Filled 2019-05-15: qty 2

## 2019-05-15 NOTE — Telephone Encounter (Signed)
Requested medication (s) are due for refill today: yes  Requested medication (s) are on the active medication list: yes  Last refill:  05/09/2019  Future visit scheduled: no  Notes to clinic:  Patient requesting 90 day supply   Requested Prescriptions  Pending Prescriptions Disp Refills   tamsulosin (FLOMAX) 0.4 MG CAPS capsule [Pharmacy Med Name: TAMSULOSIN HCL 0.4 MG CAPSULE] 90 capsule 1    Sig: TAKE 1 [place]     Urology: Alpha-Adrenergic Blocker Failed - 05/15/2019 10:08 AM      Failed - Last BP in normal range    BP Readings from Last 1 Encounters:  05/15/19 (!) 118/102         Passed - Valid encounter within last 12 months    Recent Outpatient Visits          6 days ago Centrilobular emphysema ([place])   [facility] [place], [place] T, NP   2 weeks ago Fatigue, unspecified type   [facility], [place] T, NP   2 weeks ago Elevated liver enzymes   [facility] [place], [place] T, NP   4 weeks ago Fatigue, unspecified type   [facility] [place] Kathrine Haddock, NP   2 months ago Fatigue, unspecified type   [facility], Barbaraann Faster, NP

## 2019-05-15 NOTE — Progress Notes (Signed)
Pt BP and HR elevated. Pt states these are close to his normal level and that he hasn't taken his regular medications in a few days. Received PRN meds on litho truck to control BP. Patient instructed to take his regular blood pressure/heart rate medications when he arrives home. Stated understanding. Pt discharged in stable condition, alert and oriented, denies further questions or concerns.  [place]idge Breeze, RN 05/15/2019

## 2019-05-15 NOTE — Discharge Instructions (Signed)

## 2019-05-15 NOTE — Telephone Encounter (Signed)
Routing to provider  

## 2019-05-16 ENCOUNTER — Encounter (HOSPITAL_COMMUNITY): Payer: Self-pay | Admitting: Urology

## 2019-05-20 ENCOUNTER — Telehealth: Payer: Self-pay

## 2019-05-20 NOTE — Telephone Encounter (Signed)
Called and spoke with patient. Scheduled 4w f/u for 06/17/19 at 10am

## 2019-05-20 NOTE — Telephone Encounter (Signed)
-----   Message from Venita Lick, NP sent at 05/20/2019  4:56 PM EDT ----- Would like follow-up scheduled for patient in 4 weeks.  Thank you.

## 2019-05-23 ENCOUNTER — Telehealth: Payer: Self-pay

## 2019-05-23 ENCOUNTER — Ambulatory Visit: Payer: Self-pay | Admitting: Licensed Clinical Social Worker

## 2019-05-23 NOTE — Chronic Care Management (AMB) (Signed)
  Care Management   Follow Up Note   05/23/2019 Name: KEYONTAY STOLZ MRN: [id number] DOB: Oct 28, 1960  Referred by: Venita Lick, NP Reason for referral : Care Coordination   CAIRO AGOSTINELLI is a 58 y.o. year old male who is a primary care patient of Cannady, Barbaraann Faster, NP. The care management team was consulted for assistance with care management and care coordination needs.    Review of patient status, including review of consultants reports, relevant laboratory and other test results, and collaboration with appropriate care team members and the patient's provider was performed as part of comprehensive patient evaluation and provision of chronic care management services.    LCSW completed CCM outreach attempt today but was unable to reach patient successfully. A HIPPA compliant voice message was left encouraging patient to return call once available. LCSW rescheduled CCM SW appointment as well.  A HIPPA compliant phone message was left for the patient providing contact information and requesting a return call.   Eula Fried, BSW, MSW, [facility] Care Management [email or web address] Phone: [phone]

## 2019-05-30 ENCOUNTER — Ambulatory Visit: Payer: Self-pay | Admitting: Pharmacist

## 2019-05-30 ENCOUNTER — Telehealth: Payer: Self-pay | Admitting: Nurse Practitioner

## 2019-05-30 DIAGNOSIS — E785 Hyperlipidemia, unspecified: Secondary | ICD-10-CM

## 2019-05-30 DIAGNOSIS — N200 Calculus of kidney: Secondary | ICD-10-CM

## 2019-05-30 NOTE — Telephone Encounter (Signed)
° ° °  Called pt regarding Community Resource Referral pt's mother was out of the house patient asked me to call back in 5 minutes   [phone]. [place]  ??[email or web address]  ?? [phone]   Skype

## 2019-05-30 NOTE — Telephone Encounter (Signed)
° ° °  Called pt regarding Community Resource Referral gave confirmation #'s to Ms. Dase for transportation for Mr. Lucarelli's upcoming appointments 10/27 & 10/30  [facility], Dr. Vicente Males  10/27 Tuesday 1:30pm [address], [place], [place] [address] Confirmation #[id number] 12:45pm pick up time Call for pick up after appt [facility] [phone] _________________________________________ [facility] in [place] on Friday, 10/30 at 8:30 am, [place] [address] [place], [place] [phone]  Confirmation [place] at [place]  Call for pick up after appt [place]  ??[email or web address]  ?? [phone]   Skype

## 2019-05-30 NOTE — Telephone Encounter (Signed)
Spoke with patients mother to confirm the two appointments and told her I'd follow up with confirmation #'s for the reservation for 10/27- [facility]. Vicente Males 10/30 - [facility] - [place]

## 2019-05-30 NOTE — Chronic Care Management (AMB) (Signed)
Chronic Care Management   Note  05/30/2019 Name: Mark [name] MRN: [id number] DOB: [birth year]   Subjective:  Mark [name] is a 58 y.o. year old male who is a primary care patient of Cannady, Barbaraann Faster, NP. The CCM team was consulted for assistance with chronic disease management and care coordination needs.    Contacted patient for medication management review today. Spoke with his mother.   Review of patient status, including review of consultants reports, laboratory and other test data, was performed as part of comprehensive evaluation and provision of chronic care management services.   Objective:  Lab Results  Component Value Date   CREATININE 0.96 05/09/2019   CREATININE 1.15 04/30/2019   CREATININE 1.12 04/25/2019    Lab Results  Component Value Date   HGBA1C 5.5 04/30/2019       Component Value Date/Time   CHOL 157 10/07/2018 1020   CHOL 150 09/13/2011 0419   TRIG 89 10/07/2018 1020   TRIG 222 (H) 09/13/2011 0419   HDL 50 10/07/2018 1020   HDL 44 09/13/2011 0419   CHOLHDL 4.5 03/19/2018 1146   VLDL 44 (H) 09/13/2011 0419   LDLCALC 89 10/07/2018 1020   LDLCALC 62 09/13/2011 0419    Clinical ASCVD: Yes  The ASCVD Risk score Mark Bussing DC Jr., et al., 2013) failed to calculate for the following reasons:   The patient has a prior MI or stroke diagnosis    BP Readings from Last 3 Encounters:  05/15/19 126/88  05/09/19 139/89  04/30/19 105/79    Allergies  Allergen Reactions  . Codeine Hives    Medications Reviewed Today    Reviewed by De Hollingshead, [facility] (Pharmacist) on 05/30/19 at 1130  Med List Status: <None>  Medication Order Taking? Sig Documenting Provider Last Dose Status Informant  amLODipine (NORVASC) 10 MG tablet [id number] Yes TAKE 1 TABLET BY MOUTH EVERY DAY Cannady, Jolene T, NP Taking Active   aspirin 325 MG tablet [id number] Yes Take 325 mg by mouth daily. [provider] Taking Active Other  citalopram (CELEXA) 20 MG  tablet [id number] Yes Take 1 tablet (20 mg total) by mouth daily. Kathrine Haddock, NP Taking Active   cyclobenzaprine (FLEXERIL) 10 MG tablet [id number] No Take 1 tablet (10 mg total) by mouth 3 (three) times daily as needed for muscle spasms.  Patient not taking: Reported on 05/30/2019   Mark Guarneri T, NP Not Taking Active   metoprolol tartrate (LOPRESSOR) 25 MG tablet [id number] Yes TAKE 1 TABLET BY MOUTH TWICE A DAY Cannady, Jolene T, NP Taking Active   ondansetron (ZOFRAN) 4 MG tablet [id number] No Take 1 tablet (4 mg total) by mouth daily as needed for nausea or vomiting.  Patient not taking: Reported on 05/30/2019   Cleon Gustin, MD Not Taking Active   pravastatin (PRAVACHOL) 20 MG tablet [id number] No Take 1 tablet (20 mg total) by mouth daily.  Patient not taking: Reported on 05/30/2019   Mark Guarneri T, NP Not Taking Active   tamsulosin (FLOMAX) 0.4 MG CAPS capsule [id number] Yes Take 1 capsule (0.4 mg total) by mouth daily after supper. McKenzie, Candee Furbish, MD Taking Active            Assessment:   Goals Addressed            This Visit's Progress     Patient Stated   . PharmD "We want to keep him healthy" (pt-stated)       Current Barriers:  . Polypharmacy; complex  patient with multiple comorbidities including hx CVA w/ aphasia, hemiplegia, emphysema, s/p lithotripsy . Patient's mother, Mark [name], helps manage his health. She notes that he has a f/u appointment w/ urology s/p lithotripsy on 10/30 in [place], and is wondering about assistance with transportation.  o S/p lithotripsy; patient's mother notes that he completed previous prescriptions for oxycodone/APAP and hydrocodone/APAP. Reports not needing to use cyclobenzaprine lately; tamsulosin 0.4 mg daily  o ASCVD risk reduction: amlodipine 10 mg daily, metoprolol tartrate 25 mg BID; ASA 325 mg daily; pravastatin 10 mg daily - held s/p elevation in LFTs. Patient's mother notes that she is still holding this  medication o Mood: citalopram 20 mg daily   Pharmacist Clinical Goal(s):  Marland Kitchen Over the next 90 days, patient will work with PharmD and provider towards optimized medication management  Interventions: . Comprehensive medication review performed; medication list updated in electronic medical record . Messaged Manuela Schwartz, care guide, for assistance in setting up transportation for patient to urology appointment on 06/13/2019 . Messaged Aura Dials, NP, to determine if pravastatin should continue to be held.   Patient Self Care Activities:  . Patient will take medications as prescribed  Initial goal documentation        Plan: - Will collaborate with PCP as above - CCM team will outreach patient in the next 4-6 weeks for continued support   Mark [name], PharmD Clinical Pharmacist [facility] [phone]

## 2019-05-30 NOTE — Patient Instructions (Signed)
Visit Information  Goals Addressed            This Visit's Progress     Patient Stated   . PharmD "We want to keep him healthy" (pt-stated)       Current Barriers:  . Polypharmacy; complex patient with multiple comorbidities including hx CVA w/ aphasia, hemiplegia, emphysema, s/p lithotripsy . Patient's mother, Tamela Oddi, helps manage his health. She notes that he has a f/u appointment w/ urology s/p lithotripsy on 10/30 in [place], and is wondering about assistance with transportation.  o S/p lithotripsy; patient's mother notes that he completed previous prescriptions for oxycodone/APAP and hydrocodone/APAP. Reports not needing to use cyclobenzaprine lately; tamsulosin 0.4 mg daily  o ASCVD risk reduction: amlodipine 10 mg daily, metoprolol tartrate 25 mg BID; ASA 325 mg daily; pravastatin 10 mg daily - held s/p elevation in LFTs. Patient's mother notes that she is still holding this medication o Mood: citalopram 20 mg daily   Pharmacist Clinical Goal(s):  Marland Kitchen Over the next 90 days, patient will work with PharmD and provider towards optimized medication management  Interventions: . Comprehensive medication review performed; medication list updated in electronic medical record . Messaged [name], care guide, for assistance in setting up transportation for patient to urology appointment on 06/13/2019 . Messaged Marnee Guarneri, NP, to determine if pravastatin should continue to be held.   Patient Self Care Activities:  . Patient will take medications as prescribed  Initial goal documentation        The patient verbalized understanding of instructions provided today and declined a print copy of patient instruction materials.   Plan: - Will collaborate with PCP as above - CCM team will outreach patient in the next 4-6 weeks for continued support   Catie Darnelle Maffucci, PharmD Clinical Pharmacist [place] [phone]

## 2019-06-05 ENCOUNTER — Other Ambulatory Visit: Payer: Self-pay | Admitting: Nurse Practitioner

## 2019-06-05 DIAGNOSIS — I10 Essential (primary) hypertension: Secondary | ICD-10-CM

## 2019-06-10 ENCOUNTER — Other Ambulatory Visit: Payer: Self-pay

## 2019-06-10 ENCOUNTER — Ambulatory Visit (INDEPENDENT_AMBULATORY_CARE_PROVIDER_SITE_OTHER): Payer: Medicare HMO | Admitting: Gastroenterology

## 2019-06-10 ENCOUNTER — Encounter: Payer: Self-pay | Admitting: Gastroenterology

## 2019-06-10 VITALS — BP 117/81 | HR 73 | Temp 98.1°F | Ht 70.0 in | Wt 144.0 lb

## 2019-06-10 DIAGNOSIS — R634 Abnormal weight loss: Secondary | ICD-10-CM

## 2019-06-10 DIAGNOSIS — R6889 Other general symptoms and signs: Secondary | ICD-10-CM | POA: Diagnosis not present

## 2019-06-10 DIAGNOSIS — R748 Abnormal levels of other serum enzymes: Secondary | ICD-10-CM | POA: Diagnosis not present

## 2019-06-10 NOTE — Progress Notes (Signed)
Mark Bellows MD, MRCP(U.K) [address]  [place]  [place], [place] [address]  Main: [phone]  Fax: [phone]   Gastroenterology Consultation  Referring Provider:     Venita Lick, NP Primary Care Physician:  Venita Lick, NP Primary Gastroenterologist:  Dr. Jonathon [name]  Reason for Consultation:     Weight loss        HPI:   Mark [name] is a 58 y.o. y/o male referred for consultation & management  by  Venita Lick, NP.     He has been referred for 13 pound weight loss over last 2 months with fatigue.  Smoker since age 77 .  05/08/2019: CT scan of the chest abdomen and pelvis: 1.1 cm proximal right ureteral calculus causing moderate hydronephrosis multiple nonobstructing right renal calculi.  3 mm pulmonary nodule in the left upper lobe  05/09/2019: CMP: Normal transaminases, total bilirubin.  Alkaline phosphatase 119 mildly elevated. 1 04/17/2019 and notes that the transaminases were briefly elevated.  They returned back to normal subsequently. 04/30/2019: CBC: Hemoglobin 15.4.  HbA1c 5.5.  TSH normal  He says that he has lost about 10 pounds in the last 1 month.  Not sure why that happened.  States that appetite is poor but no dysphagia or abdominal pains.  No change in bowel habits.  Never had a colonoscopy.  No family history of colon cancer polyps.  No prior tattoos, illegal drug use or Armed forces logistics/support/administrative officer.  He felt that he was admitted to [facility] way back in 2002 for bowel issues and underwent surgery and spent almost a year at that time as being inpatient.   Past Medical History:  Diagnosis Date  . Hypertension   . Stroke [facility])     Past Surgical History:  Procedure Laterality Date  . APPENDECTOMY    . bowel obstruction    . EXTRACORPOREAL SHOCK WAVE LITHOTRIPSY Right 05/15/2019   Procedure: EXTRACORPOREAL SHOCK WAVE LITHOTRIPSY (ESWL);  Surgeon: Cleon Gustin, MD;  Location: [facility] ORS;  Service: Urology;  Laterality: Right;     Prior to Admission medications   Medication Sig Start Date End Date Taking? Authorizing Provider  amLODipine (NORVASC) 10 MG tablet TAKE 1 TABLET BY MOUTH EVERY DAY 06/05/19   Cannady, Henrine Screws T, NP  aspirin 325 MG tablet Take 325 mg by mouth daily.    [provider]  citalopram (CELEXA) 20 MG tablet Take 1 tablet (20 mg total) by mouth daily. 04/17/19   Kathrine Haddock, NP  cyclobenzaprine (FLEXERIL) 10 MG tablet Take 1 tablet (10 mg total) by mouth 3 (three) times daily as needed for muscle spasms. Patient not taking: Reported on 05/30/2019 05/09/19   Marnee Guarneri T, NP  metoprolol tartrate (LOPRESSOR) 25 MG tablet TAKE 1 TABLET BY MOUTH TWICE A DAY 04/20/19   Cannady, Jolene T, NP  ondansetron (ZOFRAN) 4 MG tablet Take 1 tablet (4 mg total) by mouth daily as needed for nausea or vomiting. Patient not taking: Reported on 05/30/2019 05/15/19 05/14/20  Cleon Gustin, MD  pravastatin (PRAVACHOL) 20 MG tablet Take 1 tablet (20 mg total) by mouth daily. Patient not taking: Reported on 05/30/2019 09/05/18   Marnee Guarneri T, NP  tamsulosin (FLOMAX) 0.4 MG CAPS capsule Take 1 capsule (0.4 mg total) by mouth daily after supper. 05/15/19   McKenzie, Candee Furbish, MD    Family History  Problem Relation Age of Onset  . Heart disease Father   . Seizures Brother  Social History   Tobacco Use  . Smoking status: Current Every Day Smoker    Packs/day: 1.00  . Smokeless tobacco: Never Used  Substance Use Topics  . Alcohol use: Not on file  . Drug use: Not Currently    Allergies as of 06/10/2019 - Review Complete 05/15/2019  Allergen Reaction Noted  . Codeine Hives 05/03/2015    Review of Systems:    All systems reviewed and negative except where noted in HPI.   Physical Exam:  There were no vitals taken for this visit. No LMP for male patient. Psych:  Alert and cooperative. Normal mood and affect. General:   Alert,  Well-developed, well-nourished, pleasant and cooperative in  NAD Head:  Normocephalic and atraumatic. Eyes:  Sclera clear, no icterus.   Conjunctiva pink. Ears:  Normal auditory acuity. Nose:  No deformity, discharge, or lesions. Mouth:  No deformity or lesions,oropharynx pink & moist. Neck:  Supple; no masses or thyromegaly. Lungs:  Respirations even and unlabored.  Clear throughout to auscultation.   No wheezes, crackles, or rhonchi. No acute distress. Heart:  Regular rate and rhythm; no murmurs, clicks, rubs, or gallops. Abdomen: Large midline abdominal scar normal bowel sounds.  No bruits.  Soft, non-tender and non-distended without masses, hepatosplenomegaly or hernias noted.  No guarding or rebound tenderness.    Neurologic:  Alert and oriented x3;  grossly normal neurologically. Skin:  Intact without significant lesions or rashes. No jaundice. Lymph Nodes:  No significant cervical adenopathy. Psych:  Alert and cooperative. Normal mood and affect.  Imaging Studies: Dg Abd 1 View - Kub  Result Date: 05/15/2019 CLINICAL DATA:  Pre op for lithotripsy of right sided kidney stone, pain nausea this a.m., EXAM: ABDOMEN - 1 VIEW COMPARISON:  CT of the abdomen and pelvis on 05/08/2019 FINDINGS: Bowel gas pattern is nonobstructive. Bowel sutures are identified in the RIGHT LOWER QUADRANT. Note is made of numerous calcifications in the LOWER pole of the RIGHT kidney, largest measuring approximately 5 millimeters in diameter. The RIGHT ureteropelvic junction calculus measures 1.7 x 0.9 centimeters, unchanged compared with the previous CT exam. Surgical clips are identified in the UPPER abdomen. IMPRESSION: 1. Stable appearance of numerous calcifications in the RIGHT kidney. 2. Stable RIGHT ureteropelvic junction calculus. Electronically Signed   By: Norva Pavlov M.D.   On: 05/15/2019 09:09    Assessment and Plan:   Mark [name] is a 58 y.o. y/o male has been referred for unintentional weight loss.  No prior GI evaluation.  Also noted his alkaline  phosphatase is minimally elevated.  Plan 1.  Screen for hepatitis B, C, HIV.  Screen for celiac disease. 2.  Due to unintentional weight loss I will proceed with EGD and colonoscopy. 3.  Elevated alkaline phosphatase: Check PTH, calcium, vitamin D ANA, AMA.  If indicates from the liver with elevated GGT then will up a few liver evaluation.   I have discussed alternative options, risks & benefits,  which include, but are not limited to, bleeding, infection, perforation,respiratory complication & drug reaction.  The patient agrees with this plan & written consent will be obtained.   Mm   Follow up in 6 to 8 weeks  Dr [name] MD,MRCP(U.K)

## 2019-06-12 ENCOUNTER — Telehealth: Payer: Self-pay | Admitting: Nurse Practitioner

## 2019-06-12 LAB — ANA: Anti Nuclear Antibody (ANA): NEGATIVE

## 2019-06-12 LAB — ALKALINE PHOSPHATASE, ISOENZYMES
Alkaline Phosphatase: 134 IU/L — ABNORMAL HIGH (ref 39–117)
BONE FRACTION: 34 % (ref 12–68)
INTESTINAL FRAC.: 0 % (ref 0–18)
LIVER FRACTION: 66 % (ref 13–88)

## 2019-06-12 LAB — MITOCHONDRIAL/SMOOTH MUSCLE AB PNL
Mitochondrial Ab: <20 Units (ref 0.0–20.0)
Smooth Muscle Ab: 3 Units (ref 0–19)

## 2019-06-12 LAB — HEPATITIS C ANTIBODY: Hep C Virus Ab: <0.1 s/co ratio (ref 0.0–0.9)

## 2019-06-12 LAB — HEPATITIS B E ANTIGEN: Hep B E Ag: NEGATIVE

## 2019-06-12 LAB — HEPATITIS B E ANTIBODY: Hep B E Ab: NEGATIVE

## 2019-06-12 LAB — HEPATITIS B SURFACE ANTIBODY,QUALITATIVE: Hep B Surface Ab, Qual: NONREACTIVE

## 2019-06-12 LAB — CELIAC DISEASE AB SCREEN W/RFX
Antigliadin Abs, IgA: 9 units (ref 0–19)
IgA/Immunoglobulin A, Serum: 351 mg/dL (ref 90–386)
Transglutaminase IgA: <2 U/mL (ref 0–3)

## 2019-06-12 LAB — PTH, INTACT AND CALCIUM
Calcium: 9.9 mg/dL (ref 8.7–10.2)
PTH: 38 pg/mL (ref 15–65)

## 2019-06-12 LAB — HEPATITIS B SURFACE ANTIGEN: Hepatitis B Surface Ag: NEGATIVE

## 2019-06-12 LAB — GAMMA GT: GGT: 58 IU/L (ref 0–65)

## 2019-06-12 NOTE — Telephone Encounter (Signed)
° ° °  Incoming call from [place] at [place] who was trying to reach the pt to confirm pick up for 7am on 10/30 for travel to [place] at [facility]. Phone disconnected. She said that they will still go to his house tomorrow morning for pick up as I told her that I confirmed this appt with his mother earlier this month and she had written it down including the confirmation #. Gave dispatcher my direct # to call back if she had other questions.  [place]  ??[email or web address]   ??[phone]

## 2019-06-13 ENCOUNTER — Telehealth: Payer: Self-pay

## 2019-06-13 DIAGNOSIS — R6889 Other general symptoms and signs: Secondary | ICD-10-CM | POA: Diagnosis not present

## 2019-06-13 DIAGNOSIS — N200 Calculus of kidney: Secondary | ICD-10-CM | POA: Diagnosis not present

## 2019-06-17 ENCOUNTER — Encounter: Payer: Self-pay | Admitting: Nurse Practitioner

## 2019-06-17 ENCOUNTER — Other Ambulatory Visit: Payer: Self-pay

## 2019-06-17 ENCOUNTER — Telehealth: Payer: Self-pay

## 2019-06-17 ENCOUNTER — Ambulatory Visit (INDEPENDENT_AMBULATORY_CARE_PROVIDER_SITE_OTHER): Payer: Medicare HMO | Admitting: Nurse Practitioner

## 2019-06-17 DIAGNOSIS — R748 Abnormal levels of other serum enzymes: Secondary | ICD-10-CM

## 2019-06-17 DIAGNOSIS — N200 Calculus of kidney: Secondary | ICD-10-CM | POA: Diagnosis not present

## 2019-06-17 DIAGNOSIS — R634 Abnormal weight loss: Secondary | ICD-10-CM | POA: Diagnosis not present

## 2019-06-17 NOTE — Assessment & Plan Note (Signed)
Improving on recent labs.  Will plan on follow-up in office in one month for labs and visit.

## 2019-06-17 NOTE — Progress Notes (Signed)
There were no vitals taken for this visit.   Subjective:    Patient ID: Mark [name], male    DOB: 05/30/1961, 58 y.o.   MRN: [id number]  HPI: Mark [name] is a 58 y.o. male  Chief Complaint  Patient presents with  . Follow-up    . This visit was completed via telephone due to the restrictions of the COVID-19 pandemic. All issues as above were discussed and addressed but no physical exam was performed. If it was felt that the patient should be evaluated in the office, they were directed there. The patient verbally consented to this visit. Patient was unable to complete an audio/visual visit due to Lack of equipment. Due to the catastrophic nature of the COVID-19 pandemic, this visit was done through audio contact only. . Location of the patient: home . Location of the provider: home . Those involved with this call:  . Provider: Marnee Guarneri, DNP . CMA: Yvonna Alanis, CMA . Front Desk/Registration: Jill Side  . Time spent on call: 15 minutes on the phone discussing health concerns. 10 minutes total spent in review of patient's record and preparation of their chart.  . I verified patient identity using two factors (patient name and date of birth). Patient consents verbally to being seen via telemedicine visit today.    KIDNEY STONES AND ELEVATED LFT: Seen9/3/20 initiallyfor fatigue. Labs performed noting elevation in LFT AST/ALT 181/184 with Alk phos 178.Seen again 04/25/19 and labs improved, however still fatigued: AST/ALT 21/28 with alk phos trending downwards 119.  CT scan abdomen and lungs.  No malignant findings.  A "1.1 cm proximal right ureteral calculus which is causing moderate hydronephrosis and perinephric stranding".  Has had kidney stones twice with lithotripsy x 1 many years ago.  CT scans also noted: - multiple non obstructing right renal calculi - 3 mm pulmonary nodule in left upper lobe  - centrilobular emphysema in upper lungs  Has lithotripsy  performed on 05/15/2019 and saw GI on 06/10/2019 due to abnormal weight loss noted during recent visits, he is to have EGD and colonoscopy performed.Have recommended he have these procedures performed as is due for screening. Denies fever, chills, cough, congestion, SOB, urinary symptoms, or CP. Reports feeling 100% better and is now eating meals per his baseline + has no further fatigue. Stress/anxiety:no Insomnia:none Snoring:no Observed apnea by bed partner:no Daytime hypersomnolence:no Wakes feeling refreshed:no History of sleep study:no Dysnea on exertion:no Orthopnea/PND:no Chest pain:no Chronic cough:no Lower extremity edema:no Arthralgias:no Myalgias:no Weakness:no Rash:no  Relevant past medical, surgical, family and social history reviewed and updated as indicated. Interim medical history since our last visit reviewed. Allergies and medications reviewed and updated.  Review of Systems  Constitutional: Negative for activity change, diaphoresis, fatigue and fever.  Respiratory: Negative for cough, chest tightness, shortness of breath and wheezing.   Cardiovascular: Negative for chest pain, palpitations and leg swelling.  Gastrointestinal: Negative for abdominal distention, abdominal pain, constipation, diarrhea, nausea and vomiting.  Endocrine: Negative for cold intolerance, heat intolerance and polydipsia.  Genitourinary: Negative.   Neurological: Negative for dizziness, syncope, weakness, light-headedness, numbness and headaches.  Psychiatric/Behavioral: Negative.     Per HPI unless specifically indicated above     Objective:    There were no vitals taken for this visit.  Wt Readings from Last 3 Encounters:  06/10/19 144 lb (65.3 kg)  05/15/19 147 lb 12.8 oz (67 kg)  05/09/19 147 lb 12.8 oz (67 kg)    Physical Exam   Unable to  perform due to telephone visit only.  Results for orders placed or performed in visit on 06/10/19  Hepatitis B Surface  AntiGEN  Result Value Ref Range   Hepatitis B Surface Ag Negative Negative  Hepatitis B Surface AntiBODY  Result Value Ref Range   Hep B Surface Ab, Qual Non Reactive   Hepatitis B e antibody  Result Value Ref Range   Hep B E Ab Negative Negative  Hepatitis B e antigen  Result Value Ref Range   Hep B E Ag Negative Negative  Hepatitis C Antibody  Result Value Ref Range   Hep C Virus Ab <0.1 0.0 - 0.9 s/co ratio  Celiac Disease Ab Screen w/Rfx  Result Value Ref Range   Antigliadin Abs, IgA 9 0 - 19 units   Transglutaminase IgA <2 0 - 3 U/mL   IgA/Immunoglobulin A, Serum 351 90 - 386 mg/dL  Alkaline phosphatase, isoenzymes  Result Value Ref Range   Alkaline Phosphatase 134 (H) 39 - 117 IU/L   LIVER FRACTION 66 13 - 88 %   BONE FRACTION 34 12 - 68 %   INTESTINAL FRAC. 0 0 - 18 %  PTH, intact and calcium  Result Value Ref Range   Calcium 9.9 8.7 - 10.2 mg/dL   PTH 38 15 - 65 pg/mL   PTH Interp Comment   Mitochondrial/smooth muscle ab pnl  Result Value Ref Range   Smooth Muscle Ab 3 0 - 19 Units   Mitochondrial Ab <20.0 0.0 - 20.0 Units  Gamma GT  Result Value Ref Range   GGT 58 0 - 65 IU/L  ANA  Result Value Ref Range   Anti Nuclear Antibody (ANA) Negative Negative      Assessment & Plan:   Problem List Items Addressed This Visit      Genitourinary   Kidney stone    Improved, recent lithotripsy.  Continue to monitor and recommend diet changes at home.        Other   Elevated liver enzymes    Improving on recent labs.  Will plan on follow-up in office in one month for labs and visit.      Abnormal weight loss - Primary    Reports appetite has improved and overall feeling better.  Recommend he maintain upcoming colonoscopy and EGD procedures for further evaluation and preventative screening.  Also recommend complete cessation of smoking.  Will follow-up in office in one month.         I discussed the assessment and treatment plan with the patient. The patient  was provided an opportunity to ask questions and all were answered. The patient agreed with the plan and demonstrated an understanding of the instructions.   The patient was advised to call back or seek an in-person evaluation if the symptoms worsen or if the condition fails to improve as anticipated.   I provided 15 minutes of time during this encounter.  Follow up plan: Return in about 4 weeks (around 07/15/2019) for HTN, abnormal weight loss, and elevated LFT. 

## 2019-06-17 NOTE — Patient Instructions (Signed)

## 2019-06-17 NOTE — Assessment & Plan Note (Signed)
Improved, recent lithotripsy.  Continue to monitor and recommend diet changes at home.

## 2019-06-17 NOTE — Assessment & Plan Note (Signed)
Reports appetite has improved and overall feeling better.  Recommend he maintain upcoming colonoscopy and EGD procedures for further evaluation and preventative screening.  Also recommend complete cessation of smoking.  Will follow-up in office in one month.

## 2019-06-18 ENCOUNTER — Telehealth: Payer: Self-pay

## 2019-06-18 ENCOUNTER — Ambulatory Visit: Payer: Self-pay | Admitting: Licensed Clinical Social Worker

## 2019-06-18 NOTE — Chronic Care Management (AMB) (Signed)
  Care Management   Follow Up Note   06/18/2019 Name: Mark [name] MRN: [id number] DOB: August 05, 1961  Referred by: Venita Lick, NP Reason for referral : Care Coordination   Mark [name] is a 58 y.o. year old male who is a primary care patient of Cannady, Barbaraann Faster, NP. The care management team was consulted for assistance with care management and care coordination needs.    Review of patient status, including review of consultants reports, relevant laboratory and other test results, and collaboration with appropriate care team members and the patient's provider was performed as part of comprehensive patient evaluation and provision of chronic care management services.    LCSW completed CCM outreach attempt today but was unable to reach patient successfully. A HIPPA compliant voice message was left encouraging patient to return call once available. LCSW rescheduled CCM SW appointment as well.  A HIPPA compliant phone message was left for the patient providing contact information and requesting a return call.   Eula Fried, BSW, MSW, [facility] Care Management [email or web address] Phone: [phone]

## 2019-06-24 ENCOUNTER — Encounter: Payer: Self-pay | Admitting: Gastroenterology

## 2019-06-25 ENCOUNTER — Telehealth: Payer: Self-pay

## 2019-06-25 NOTE — Telephone Encounter (Signed)
Spoke with pt and informed him of lab results and Dr. Georgeann Oppenheim recommendations. I also informed pt that we have received blood thinner holding instructions for the Aspirin 325 mg so we can now proceed with scheduling his EGD/Colonoscopy. Pt states he wants to hold off on scheduling the procedure for now. Pt plans to contact our office when he's ready to schedule.

## 2019-06-25 NOTE — Telephone Encounter (Signed)
-----   Message from Jonathon Bellows, MD sent at 06/24/2019 11:45 AM EST ----- Mark [name] please inform all test to evaluate the elevated alkaline phosphatase has come back negative.  The GGT is also negative indicating that there is is not related to the liver.  In addition the alkaline phosphatase fractionated levels all are normal.  He does not require any further evaluation of this at this point of time.  I would suggest we recheck his alkaline phosphatase every 6 months.  And at any point of time we start to see it rise or increase rapidly we may have to consider further tests including a liver biopsy at that point of time.  Dr Jonathon Bellows MD,MRCP [facility]) Gastroenterology/Hepatology Pager: [phone]   C/c Venita Lick, NP

## 2019-06-26 NOTE — Telephone Encounter (Signed)
[place]

## 2019-06-26 NOTE — Telephone Encounter (Signed)
Thank you Dr. Vicente Males.  I will continue to work with patient on this and recommend he have procedures done.

## 2019-07-23 NOTE — Telephone Encounter (Signed)
Signing encounter °

## 2019-07-28 ENCOUNTER — Ambulatory Visit: Payer: Self-pay | Admitting: Licensed Clinical Social Worker

## 2019-07-28 ENCOUNTER — Telehealth: Payer: Self-pay

## 2019-07-28 NOTE — Chronic Care Management (AMB) (Signed)
  Care Management   Follow Up Note   07/28/2019 Name: REBECCA MOTTA MRN: [id number] DOB: 1960/12/31  Referred by: Venita Lick, NP Reason for referral : Care Coordination   IMAN OROURKE is a 58 y.o. year old male who is a primary care patient of Cannady, Barbaraann Faster, NP. The care management team was consulted for assistance with care management and care coordination needs.    Review of patient status, including review of consultants reports, relevant laboratory and other test results, and collaboration with appropriate care team members and the patient's provider was performed as part of comprehensive patient evaluation and provision of chronic care management services.    LCSW completed CCM outreach attempt today but was unable to reach patient successfully. A HIPPA compliant voice message was left encouraging patient to return call once available. LCSW rescheduled CCM SW appointment as well.  A HIPPA compliant phone message was left for the patient providing contact information and requesting a return call.   Eula Fried, BSW, MSW, [facility] Care Management [email or web address] Phone: [phone]

## 2019-07-31 ENCOUNTER — Encounter: Payer: Self-pay | Admitting: Nurse Practitioner

## 2019-07-31 ENCOUNTER — Ambulatory Visit (INDEPENDENT_AMBULATORY_CARE_PROVIDER_SITE_OTHER): Payer: Medicare HMO | Admitting: Nurse Practitioner

## 2019-07-31 ENCOUNTER — Other Ambulatory Visit: Payer: Self-pay

## 2019-07-31 VITALS — Wt 155.0 lb

## 2019-07-31 DIAGNOSIS — R634 Abnormal weight loss: Secondary | ICD-10-CM | POA: Diagnosis not present

## 2019-07-31 DIAGNOSIS — I10 Essential (primary) hypertension: Secondary | ICD-10-CM | POA: Diagnosis not present

## 2019-07-31 DIAGNOSIS — E785 Hyperlipidemia, unspecified: Secondary | ICD-10-CM | POA: Diagnosis not present

## 2019-07-31 DIAGNOSIS — R748 Abnormal levels of other serum enzymes: Secondary | ICD-10-CM

## 2019-07-31 NOTE — Assessment & Plan Note (Addendum)
Appears to be improving with gain noted.  Will continue to reiterate importance of colonoscopy and need for this screening.

## 2019-07-31 NOTE — Progress Notes (Signed)
Wt 155 lb (70.3 kg)   BMI 22.24 kg/m    Subjective:    Patient ID: Mark [name], male    DOB: [birth year], 59 y.o.   MRN: [id number]  HPI: Mark [name] is a 58 y.o. male  Chief Complaint  Patient presents with  . Hypertension  . Weight Check    . This visit was completed via telephone due to the restrictions of the COVID-19 pandemic. All issues as above were discussed and addressed but no physical exam was performed. If it was felt that the patient should be evaluated in the office, they were directed there. The patient verbally consented to this visit. Patient was unable to complete an audio/visual visit due to Lack of equipment. Due to the catastrophic nature of the COVID-19 pandemic, this visit was done through audio contact only. . Location of the patient: home . Location of the provider: home . Those involved with this call:  . Provider: Marnee Guarneri, DNP . CMA: Yvonna Alanis, CMA . Front Desk/Registration: Jill Side  . Time spent on call: 15 minutes on the phone discussing health concerns. 10 minutes total spent in review of patient's record and preparation of their chart.  . I verified patient identity using two factors (patient name and date of birth). Patient consents verbally to being seen via telemedicine visit today.    HYPERTENSION/HLD Continues on Metoprolol, Amlodipine, and Pravastatin.  On weight check today is gaining weight again since recent lithotripsy in October.  Endorses feeling a lot better and back to eating at baseline.  He did see GI and it was recommended he have colonoscopy, has not had one yet and has risk factor of smoking.  Discussed with him today colon cancer and the importance of screening, he reports "alright, I will get it down, just not right now".  Will continue to reiterate need for this at appointments. Hypertension status: stable  Satisfied with current treatment? yes Duration of hypertension: chronic BP monitoring  frequency:  rarely BP range: could not provide numbers, just stated "it is good" BP medication side effects:  no Medication compliance: good compliance Aspirin: yes Recurrent headaches: no Visual changes: no Palpitations: no Dyspnea: no Chest pain: no Lower extremity edema: no Dizzy/lightheaded: no  The ASCVD Risk score Mikey Bussing DC Jr., et al., 2013) failed to calculate for the following reasons:   The patient has a prior MI or stroke diagnosis  Relevant past medical, surgical, family and social history reviewed and updated as indicated. Interim medical history since our last visit reviewed. Allergies and medications reviewed and updated.  Review of Systems  Constitutional: Negative for activity change, diaphoresis, fatigue and fever.  Respiratory: Negative for cough, chest tightness, shortness of breath and wheezing.   Cardiovascular: Negative for chest pain, palpitations and leg swelling.  Gastrointestinal: Negative for abdominal distention, abdominal pain, constipation, diarrhea, nausea and vomiting.  Neurological: Negative.   Psychiatric/Behavioral: Negative.     Per HPI unless specifically indicated above     Objective:    Wt 155 lb (70.3 kg)   BMI 22.24 kg/m   Wt Readings from Last 3 Encounters:  07/31/19 155 lb (70.3 kg)  06/10/19 144 lb (65.3 kg)  05/15/19 147 lb 12.8 oz (67 kg)    Physical Exam   Unable to perform due to telephone visit only.  Results for orders placed or performed in visit on 06/10/19  Hepatitis B Surface AntiGEN  Result Value Ref Range   Hepatitis B Surface Ag Negative Negative  Hepatitis B Surface AntiBODY  Result Value Ref Range   Hep B Surface Ab, Qual Non Reactive   Hepatitis B e antibody  Result Value Ref Range   Hep B E Ab Negative Negative  Hepatitis B e antigen  Result Value Ref Range   Hep B E Ag Negative Negative  Hepatitis C Antibody  Result Value Ref Range   Hep C Virus Ab <0.1 0.0 - 0.9 s/co ratio  Celiac Disease Ab  Screen w/Rfx  Result Value Ref Range   Antigliadin Abs, IgA 9 0 - 19 units   Transglutaminase IgA <2 0 - 3 U/mL   IgA/Immunoglobulin A, Serum 351 90 - 386 mg/dL  Alkaline phosphatase, isoenzymes  Result Value Ref Range   Alkaline Phosphatase 134 (H) 39 - 117 IU/L   LIVER FRACTION 66 13 - 88 %   BONE FRACTION 34 12 - 68 %   INTESTINAL FRAC. 0 0 - 18 %  PTH, intact and calcium  Result Value Ref Range   Calcium 9.9 8.7 - 10.2 mg/dL   PTH 38 15 - 65 pg/mL   PTH Interp Comment   Mitochondrial/smooth muscle ab pnl  Result Value Ref Range   Smooth Muscle Ab 3 0 - 19 Units   Mitochondrial Ab <20.0 0.0 - 20.0 Units  Gamma GT  Result Value Ref Range   GGT 58 0 - 65 IU/L  ANA  Result Value Ref Range   Anti Nuclear Antibody (ANA) Negative Negative      Assessment & Plan:   Problem List Items Addressed This Visit      Cardiovascular and Mediastinum   Hypertension - Primary    Chronic, ongoing.  Reports stable BP at home.  Continue current medication regimen and adjust as needed.  Will plan on obtaining outpatient CMP and follow-up in 3 months with face to face.      Relevant Orders   Comprehensive metabolic panel     Other   Hyperlipidemia LDL goal <70    Chronic, ongoing.  Continue current medication regimen and adjust as needed.  Plan on outpatient lipid panel and follow-up in 3 months with face to face.      Relevant Orders   Comprehensive metabolic panel   59m Lipid Panel   Elevated liver enzymes    Recheck CMP outpatient.      Relevant Orders   36m Lipid Panel   Abnormal weight loss    Appears to be improving with gain noted.  Will continue to reiterate importance of colonoscopy and need for this screening.        Relevant Orders   Comprehensive metabolic panel      I discussed the assessment and treatment plan with the patient. The patient was provided an opportunity to ask questions and all were answered. The patient agreed with the plan and demonstrated an  understanding of the instructions.   The patient was advised to call back or seek an in-person evaluation if the symptoms worsen or if the condition fails to improve as anticipated.   I provided 15 minutes of time during this encounter.  Follow up plan: Return in about 3 months (around 10/29/2019) for HTN/HLD, Prediabetes, Emphysema.

## 2019-07-31 NOTE — Patient Instructions (Signed)
Colorectal Cancer  Colorectal cancer is an abnormal growth of cells and tissue (tumor) in the colon or rectum, which are parts of the large intestine. The cancer can spread (metastasize) to other parts of the body. What are the causes? Most cases of colorectal cancer start as abnormal growths called polyps on the inner wall of the colon or rectum. Other times, abnormal changes to genes (genetic mutations) can cause cells to form cancer. What increases the risk? You are more likely to develop this condition if:  You are older than age 50.  You have multiple polyps in the colon or rectum.  You have diabetes.  You are African American.  You have a family history of Lynch syndrome.  You have had cancer before.  You have certain hereditary conditions, such as: ? Familial adenomatous polyposis. ? Turcot syndrome. ? Peutz-Jeghers syndrome.  You eat a diet that is high in fat (especially animal fat) and low in fiber, fruits, and vegetables.  You have an inactive (sedentary) lifestyle.  You have an inflammatory bowel disease or Crohn's disease.  You smoke.  You drink alcohol excessively. What are the signs or symptoms? Early colorectal cancer often does not cause symptoms. As the cancer grows, symptoms may include:  Changes in bowel habits.  Feeling like the bowel does not empty completely after a bowel movement.  Stools that are narrower than usual.  Blood in the stool.  Diarrhea.  Constipation.  Anemia.  Discomfort, pain, bloating, fullness, or cramps in the abdomen.  Frequent gas pain.  Unexplained weight loss.  Constant fatigue.  Nausea and vomiting. How is this diagnosed? This condition may be diagnosed with:  A medical history.  A physical exam.  Tests. These may include: ? A digital rectal exam. ? A stool test called a fecal occult blood test. ? Blood tests. ? A test in which a tissue sample is taken from the colon or rectum and examined under a  microscope (biopsy). ? Imaging tests, such as:  X-rays.  A barium enema.  CT scans.  MRIs.  A sigmoidoscopy. This test is done to view the inside of the rectum.  A colonoscopy. This test is done to view the inside of the colon. During this test, small polyps can be removed or biopsies may be taken.  An endorectal ultrasound. This test checks how deep a tumor in the rectum has grown and whether the cancer has spread to lymph nodes or other nearby tissues. Your health care provider may order additional tests to find out whether the cancer has spread to other parts of the body (what stage it is). The stages of cancer include:  Stage 0. At this stage, the cancer is found only in the innermost lining of the colon or rectum.  Stage I. At this stage, the cancer has grown into the inner wall of the colon or rectum.  Stage II. At this stage, the cancer has gone more deeply into the wall of the colon or rectum or through the wall. It may have invaded nearby tissue.  Stage III. At this stage, the cancer has spread to nearby lymph nodes.  Stage IV. At this stage, the cancer has spread to other parts of the body, such as the liver or lungs. How is this treated? Treatment for this condition depends on the type and stage of the cancer. Treatment may include:  Surgery. In the early stages of the cancer, surgery may be done to remove polyps or small tumors from the   colon. In later stages, surgery may be done to remove part of the colon.  Chemotherapy. This treatment uses medicines to kill cancer cells.  Targeted therapy. This treatment targets specific gene mutations or proteins that the cancer expresses in order to kill tumor cells.  Radiation therapy. This treatment uses radiation to kill cancer cells or shrink tumors.  Radiofrequency ablation. This treatment uses radio waves to destroy the tumors that may have spread to other areas of the body, such as the liver. Follow these instructions at  home:  Take over-the-counter and prescription medicines only as told by your health care provider.  Try to eat regular, healthy meals. Some of your treatments might affect your appetite. If you are having problems eating or with your appetite, ask to meet with a food and nutrition specialist (dietitian).  Consider joining a support group. This may help you learn about your diagnosis and cope with the stress of having colorectal cancer.  If you are admitted to the hospital, inform your cancer care team.  Keep all follow-up visits as told by your health care provider. This is important. How is this prevented?  Colorectal cancer can be prevented with screening tests that find polyps so they can be removed before they develop into cancer.  All adults should have screening for colorectal cancer starting at age 50 and continuing until age 75. Your health care provider may recommend screening at age 45. People at increased risk should start screening at an earlier age. Where to find more information  American Cancer Society: https://www.cancer.org  National Cancer Institute (NCI): https://www.cancer.gov Contact a health care provider if:  Your diarrhea or constipation does not go away.  You have blood in your stool.  Your bowel habits change.  You have increased pain in your abdomen.  You notice new fatigue or weakness.  You lose weight. Get help right away if:  You have increased bleeding from your rectum.  You have any uncontrollable or severe abdomen (abdominal) symptoms. Summary  Colorectal cancer is an abnormal growth of cells and tissue (tumor) in the colon or rectum.  Common risk factors for this condition include having a relative with colon cancer, being older in age, having an inflammatory bowel disease, and being African American.  This condition may be diagnosed with tests, such as a colonoscopy and biopsy.  Treatment depends on the type and stage of the cancer.  Commonly, treatment includes surgery to remove the tumor along with chemotherapy or targeted therapy.  Keep all follow-up visits as told by your health care provider. This is important. This information is not intended to replace advice given to you by your health care provider. Make sure you discuss any questions you have with your health care provider. Document Released: 07/31/2005 Document Revised: 09/20/2017 Document Reviewed: 09/01/2016 Elsevier Patient Education  2020 [employer]  

## 2019-07-31 NOTE — Assessment & Plan Note (Signed)
Chronic, ongoing.  Continue current medication regimen and adjust as needed.  Plan on outpatient lipid panel and follow-up in 3 months with face to face.

## 2019-07-31 NOTE — Assessment & Plan Note (Signed)
Chronic, ongoing.  Reports stable BP at home.  Continue current medication regimen and adjust as needed.  Will plan on obtaining outpatient CMP and follow-up in 3 months with face to face.

## 2019-07-31 NOTE — Progress Notes (Signed)
Unable to LVM, Sent letter.  °

## 2019-07-31 NOTE — Assessment & Plan Note (Signed)
Recheck CMP outpatient.

## 2019-08-02 ENCOUNTER — Other Ambulatory Visit: Payer: Self-pay | Admitting: Nurse Practitioner

## 2019-08-02 DIAGNOSIS — I10 Essential (primary) hypertension: Secondary | ICD-10-CM

## 2019-08-09 ENCOUNTER — Other Ambulatory Visit: Payer: Self-pay | Admitting: Nurse Practitioner

## 2019-08-13 ENCOUNTER — Telehealth: Payer: Self-pay

## 2019-08-13 ENCOUNTER — Telehealth: Payer: Self-pay | Admitting: Nurse Practitioner

## 2019-08-13 ENCOUNTER — Ambulatory Visit: Payer: Self-pay | Admitting: *Deleted

## 2019-08-13 NOTE — Chronic Care Management (AMB) (Signed)
  Chronic Care Management   Outreach Note  08/13/2019 Name: Mark [name] MRN: [id number] DOB: May 26, 1961  Referred by: Venita Lick, NP Reason for referral : Chronic Care Management (UNsuccessful Outreach )   An unsuccessful telephone outreach was attempted today. The patient was referred to the case management team by for assistance with care management and care coordination.   Follow Up Plan: The care management team will reach out to the patient again over the next 60  days. Was unable to leave a voicemail.   Merlene Morse Kaysin Brock RN, BSN Nurse Case Editor, [facility]  [phone]) Business Mobile

## 2019-08-13 NOTE — Chronic Care Management (AMB) (Signed)
**Note Mark-Identified via Obfuscation** °  Care Management   Note  08/13/2019 Name: Mark [name] MRN: [id number] DOB: Jul 03, 1961  Mark [name] is a 58 y.o. year old male who is a primary care patient of Venita Lick, NP and is actively engaged with the care management team. I reached out to Mark [name] by phone today to assist with re-scheduling a follow up appointment with the RN Case Manager  Follow up plan: The care management team will reach out to the patient again over the next 7 days. , A HIPPA compliant phone message was left for the patient providing contact information and requesting a return call.  and If patient returns call to provider office, please advise to call Mark [name] at [place] [place] [facility]  [place], [place] [address] Direct Dial: [email or web address]  Website: [email or web address]

## 2019-08-18 ENCOUNTER — Ambulatory Visit: Payer: Medicare HMO | Admitting: Gastroenterology

## 2019-08-18 NOTE — Chronic Care Management (AMB) (Signed)
  Care Management   Note  08/18/2019 Name: Mark [name] MRN: [id number] DOB: Oct 04, 1960  Mark [name] is a 59 y.o. year old male who is a primary care patient of Marjie Skiff, NP and is actively engaged with the care management team. I reached out to [name] by phone today to assist with re-scheduling a follow up appointment with the RN Case Manager  Follow up plan: Telephone appointment with care management team member scheduled for: 09/24/2019  [facility] Guide, Embedded Care Coordination [facility]  [place], [place] [address] Direct Dial: [phone] [email or web address]  Website: [email or web address]

## 2019-08-22 ENCOUNTER — Ambulatory Visit: Payer: Self-pay | Admitting: Licensed Clinical Social Worker

## 2019-08-25 NOTE — Chronic Care Management (AMB) (Signed)
  Care Management   Follow Up Note   08/25/2019 Name: Mark [name] MRN: [id number] DOB: 1961-04-30  Referred by: Marjie Skiff, NP Reason for referral : Care Coordination   Mark [name] is a 59 y.o. year old male who is a primary care patient of Cannady, Dorie Rank, NP. The care management team was consulted for assistance with care management and care coordination needs.    Review of patient status, including review of consultants reports, relevant laboratory and other test results, and collaboration with appropriate care team members and the patient's provider was performed as part of comprehensive patient evaluation and provision of chronic care management services.    LCSW completed CCM outreach attempt today but was unable to reach patient successfully. A HIPPA compliant voice message was left encouraging patient to return call once available. LCSW rescheduled CCM SW appointment as well.  A HIPPA compliant phone message was left for the patient providing contact information and requesting a return call.   Dickie La, BSW, MSW, LCSW [facility] [place]  [facility] [email or web address] Phone: [phone]

## 2019-09-16 ENCOUNTER — Other Ambulatory Visit: Payer: Self-pay | Admitting: Nurse Practitioner

## 2019-09-23 ENCOUNTER — Other Ambulatory Visit: Payer: Self-pay | Admitting: Nurse Practitioner

## 2019-09-23 DIAGNOSIS — I10 Essential (primary) hypertension: Secondary | ICD-10-CM

## 2019-09-24 ENCOUNTER — Telehealth: Payer: Self-pay

## 2019-10-17 ENCOUNTER — Ambulatory Visit: Payer: Self-pay | Admitting: General Practice

## 2019-10-17 ENCOUNTER — Telehealth: Payer: Self-pay

## 2019-10-17 NOTE — Chronic Care Management (AMB) (Signed)
°  Chronic Care Management   Outreach Note  10/17/2019 Name: Mark [name] MRN: [id number] DOB: 12-29-1960  Referred by: Marjie Skiff, NP Reason for referral : Chronic Care Management (Follow up call: Chronic Disease management and care coordination needs)   A second unsuccessful telephone outreach was attempted today. The patient was referred to the case management team for assistance with care management and care coordination.   Follow Up Plan: The care management team will reach out to the patient again over the next 30 days.   Alto Denver RN, MSN, CCM Community Care Coordinator [place]   [facility] Mobile: [phone]

## 2019-10-19 ENCOUNTER — Other Ambulatory Visit: Payer: Self-pay | Admitting: Unknown Physician Specialty

## 2019-10-26 ENCOUNTER — Other Ambulatory Visit: Payer: Self-pay | Admitting: Nurse Practitioner

## 2019-10-26 DIAGNOSIS — I10 Essential (primary) hypertension: Secondary | ICD-10-CM

## 2019-10-26 NOTE — Telephone Encounter (Signed)
Requested Prescriptions  Pending Prescriptions Disp Refills  . metoprolol tartrate (LOPRESSOR) 25 MG tablet [Pharmacy Med Name: METOPROLOL TARTRATE 25 MG TAB] 180 tablet 1    Sig: TAKE 1 TABLET BY MOUTH TWICE A DAY     Cardiovascular:  Beta Blockers Passed - 10/26/2019 12:54 AM      Passed - Last BP in normal range    BP Readings from Last 1 Encounters:  06/10/19 117/81         Passed - Last Heart Rate in normal range    Pulse Readings from Last 1 Encounters:  06/10/19 73         Passed - Valid encounter within last 6 months    Recent Outpatient Visits          2 months ago Essential hypertension   [facility] [place], [place] T, NP   4 months ago Abnormal weight loss   [facility] [place], [place] T, NP   5 months ago Centrilobular emphysema (HCC)   [facility] [place], [place] T, NP   5 months ago Fatigue, unspecified type   [facility] [place], Ong T, NP   6 months ago Elevated liver enzymes   [facility] [place], Dorie Rank, NP

## 2019-11-26 ENCOUNTER — Other Ambulatory Visit: Payer: Self-pay | Admitting: Nurse Practitioner

## 2019-11-26 ENCOUNTER — Ambulatory Visit: Payer: Medicare HMO

## 2019-11-26 ENCOUNTER — Telehealth: Payer: Self-pay | Admitting: Nurse Practitioner

## 2019-11-26 DIAGNOSIS — Z8673 Personal history of transient ischemic attack (TIA), and cerebral infarction without residual deficits: Secondary | ICD-10-CM

## 2019-11-26 NOTE — Telephone Encounter (Signed)
Sent in referral

## 2019-11-26 NOTE — Telephone Encounter (Signed)
Copied from CRM [id number]. Topic: Referral - Request for Referral >> Nov 26, 2019  1:35 PM Lynne Logan D wrote: Has patient seen PCP for this complaint? No *If NO, is insurance requiring patient see PCP for this issue before PCP can refer them? Referral for which specialty: Occupational Therapy Preferred provider/office: [facility] Physical Therapy Department/ # [phone] Reason for referral: Pt needs to have an evaluation in order to get his license back.

## 2019-11-27 NOTE — Telephone Encounter (Signed)
Called pt to let him know that referral has been sent, no answer, no vm

## 2019-12-01 ENCOUNTER — Telehealth: Payer: Self-pay | Admitting: Nurse Practitioner

## 2019-12-01 NOTE — Telephone Encounter (Signed)
Copied from CRM [id number]. Topic: General - Other >> Dec 01, 2019 12:58 PM Tamela Oddi wrote: Reason for CRM: Patient called to get a referral from [facility] to get his driver's license back.   He stated that he had a stroke and needs to be able to drive again.  Please call patient to discuss at [phone]

## 2019-12-01 NOTE — Telephone Encounter (Signed)
Called patient and let him know that the referral was entered and faxed last week. Advised him that he should be getting a call from them soon to be scheduled (see previous encounter).

## 2019-12-03 ENCOUNTER — Ambulatory Visit: Payer: Self-pay | Admitting: General Practice

## 2019-12-03 ENCOUNTER — Telehealth: Payer: Self-pay

## 2019-12-03 NOTE — Chronic Care Management (AMB) (Signed)
°  Chronic Care Management   Outreach Note  12/03/2019 Name: Mark [name] MRN: [id number] DOB: 1961/01/21  Referred by: Marjie Skiff, NP Reason for referral : Chronic Care Management (3rd attempt to reach the patient for follow up: RNCM chronic Disease Managment and Care coordination Needs)   Third unsuccessful telephone outreach was attempted today. The patient was referred to the case management team for assistance with care management and care coordination. The patient's primary care provider has been notified of our unsuccessful attempts to make or maintain contact with the patient. The care management team is pleased to engage with this patient at any time in the future should he/she be interested in assistance from the care management team.   Follow Up Plan: The care management team is available to follow up with the patient after provider conversation with the patient regarding recommendation for care management engagement and subsequent re-referral to the care management team.   Alto Denver RN, MSN, CCM Community Care Coordinator [place]   [facility] Mobile: [phone]

## 2019-12-09 NOTE — Telephone Encounter (Signed)
Called pt back, no answer, no vm.

## 2019-12-15 ENCOUNTER — Other Ambulatory Visit: Payer: Self-pay | Admitting: Nurse Practitioner

## 2019-12-15 DIAGNOSIS — I10 Essential (primary) hypertension: Secondary | ICD-10-CM

## 2019-12-19 ENCOUNTER — Ambulatory Visit: Payer: Medicare HMO | Admitting: Nurse Practitioner

## 2019-12-25 ENCOUNTER — Ambulatory Visit (INDEPENDENT_AMBULATORY_CARE_PROVIDER_SITE_OTHER): Payer: Medicare HMO | Admitting: Family Medicine

## 2019-12-25 ENCOUNTER — Encounter: Payer: Self-pay | Admitting: Family Medicine

## 2019-12-25 ENCOUNTER — Ambulatory Visit: Payer: Medicare HMO

## 2019-12-25 ENCOUNTER — Ambulatory Visit
Admission: RE | Admit: 2019-12-25 | Discharge: 2019-12-25 | Disposition: A | Discharge status: Home / Self Care | Payer: Medicare HMO | Attending: Family Medicine | Admitting: Family Medicine

## 2019-12-25 ENCOUNTER — Ambulatory Visit
Admission: RE | Admit: 2019-12-25 | Discharge: 2019-12-25 | Disposition: A | Discharge status: Home / Self Care | Payer: Medicare HMO | Source: Ambulatory Visit | Attending: Family Medicine | Admitting: Family Medicine

## 2019-12-25 ENCOUNTER — Other Ambulatory Visit: Payer: Self-pay

## 2019-12-25 VITALS — BP 139/91 | HR 70 | Temp 98.0°F | Ht 65.35 in | Wt 153.6 lb

## 2019-12-25 DIAGNOSIS — R300 Dysuria: Secondary | ICD-10-CM | POA: Diagnosis not present

## 2019-12-25 DIAGNOSIS — Z87442 Personal history of urinary calculi: Secondary | ICD-10-CM | POA: Insufficient documentation

## 2019-12-25 DIAGNOSIS — M545 Low back pain, unspecified: Secondary | ICD-10-CM

## 2019-12-25 DIAGNOSIS — N200 Calculus of kidney: Secondary | ICD-10-CM | POA: Diagnosis not present

## 2019-12-25 LAB — UA/M W/RFLX CULTURE, ROUTINE
Bilirubin, UA: NEGATIVE
Glucose, UA: NEGATIVE
Ketones, UA: NEGATIVE
Leukocytes,UA: NEGATIVE
Nitrite, UA: NEGATIVE
Protein,UA: NEGATIVE
RBC, UA: NEGATIVE
Specific Gravity, UA: 1.015 (ref 1.005–1.030)
Urobilinogen, Ur: 0.2 mg/dL (ref 0.2–1.0)
pH, UA: 5.5 (ref 5.0–7.5)

## 2019-12-25 MED ORDER — NAPROXEN 500 MG PO TABS: 500.0000 mg | ORAL_TABLET | Freq: Two times a day (BID) | ORAL | 3 refills | Status: DC

## 2019-12-25 MED ORDER — CYCLOBENZAPRINE HCL 10 MG PO TABS: 10.0000 mg | ORAL_TABLET | Freq: Three times a day (TID) | ORAL | 0 refills | Status: DC | PRN

## 2019-12-25 NOTE — Assessment & Plan Note (Signed)
Will check KUB to look for stone, based on location of pain more concerned for lumbar pathology.

## 2019-12-25 NOTE — Progress Notes (Signed)
BP (!) 139/91 (BP Location: Left Arm, Patient Position: Sitting, Cuff Size: Normal)   Pulse 70   Temp 98 F (36.7 C) (Oral)   Ht 5' 5.35" (1.66 m)   Wt 153 lb 9.6 oz (69.7 kg)   SpO2 98%   BMI 25.28 kg/m    Subjective:    Patient ID: Mark [name], male    DOB: 23-Jan-1961, 59 y.o.   MRN: [id number]  HPI: Mark [name] is a 59 y.o. male  Chief Complaint  Patient presents with  . Back Pain  . Dysuria  . Urinary Frequency   URINARY SYMPTOMS Duration: 1 week Dysuria: burning Urinary frequency: yes Urgency: yes Small volume voids: no Symptom severity: moderate Urinary incontinence: no Foul odor: no Hematuria: no Abdominal pain: no Back pain: yes Suprapubic pain/pressure: no Flank pain: no Fever:  no Vomiting: no Relief with cranberry juice: no Relief with pyridium: no Status: stable Previous urinary tract infection: no Recurrent urinary tract infection: no Sexual activity: No sexually active/monogomous/practicing safe sex History of sexually transmitted disease: no Penile discharge: no Treatments attempted: tylenol   Relevant past medical, surgical, family and social history reviewed and updated as indicated. Interim medical history since our last visit reviewed. Allergies and medications reviewed and updated.  Review of Systems  Constitutional: Negative.   HENT: Negative.   Respiratory: Negative.   Cardiovascular: Negative.   Gastrointestinal: Negative.   Genitourinary: Positive for dysuria, frequency and urgency. Negative for decreased urine volume, difficulty urinating, discharge, enuresis, flank pain, genital sores, hematuria, penile pain, penile swelling, scrotal swelling and testicular pain.  Musculoskeletal: Positive for back pain and myalgias. Negative for arthralgias, gait problem, joint swelling, neck pain and neck stiffness.  Skin: Negative.   Neurological: Negative.     Per HPI unless specifically indicated above     Objective:      BP (!) 139/91 (BP Location: Left Arm, Patient Position: Sitting, Cuff Size: Normal)   Pulse 70   Temp 98 F (36.7 C) (Oral)   Ht 5' 5.35" (1.66 m)   Wt 153 lb 9.6 oz (69.7 kg)   SpO2 98%   BMI 25.28 kg/m   Wt Readings from Last 3 Encounters:  12/25/19 153 lb 9.6 oz (69.7 kg)  07/31/19 155 lb (70.3 kg)  06/10/19 144 lb (65.3 kg)    Physical Exam Vitals and nursing note reviewed.  Constitutional:      General: He is not in acute distress.    Appearance: Normal appearance. He is not ill-appearing, toxic-appearing or diaphoretic.  HENT:     Head: Normocephalic and atraumatic.     Right Ear: External ear normal.     Left Ear: External ear normal.     Nose: Nose normal.     Mouth/Throat:     Mouth: Mucous membranes are moist.     Pharynx: Oropharynx is clear.  Eyes:     General: No scleral icterus.       Right eye: No discharge.        Left eye: No discharge.     Extraocular Movements: Extraocular movements intact.     Conjunctiva/sclera: Conjunctivae normal.     Pupils: Pupils are equal, round, and reactive to light.  Cardiovascular:     Rate and Rhythm: Normal rate and regular rhythm.     Pulses: Normal pulses.     Heart sounds: Normal heart sounds. No murmur. No friction rub. No gallop.   Pulmonary:     Effort: Pulmonary effort is  normal. No respiratory distress.     Breath sounds: Normal breath sounds. No stridor. No wheezing, rhonchi or rales.  Chest:     Chest wall: No tenderness.  Musculoskeletal:        General: Normal range of motion.     Cervical back: Normal range of motion and neck supple.  Skin:    General: Skin is warm and dry.     Capillary Refill: Capillary refill takes less than 2 seconds.     Coloration: Skin is not jaundiced or pale.     Findings: No bruising, erythema, lesion or rash.  Neurological:     General: No focal deficit present.     Mental Status: He is alert and oriented to person, place, and time. Mental status is at baseline.   Psychiatric:        Mood and Affect: Mood normal.        Behavior: Behavior normal.        Thought Content: Thought content normal.        Judgment: Judgment normal.     Results for orders placed or performed in visit on 06/10/19  Hepatitis B Surface AntiGEN  Result Value Ref Range   Hepatitis B Surface Ag Negative Negative  Hepatitis B Surface AntiBODY  Result Value Ref Range   Hep B Surface Ab, Qual Non Reactive   Hepatitis B e antibody  Result Value Ref Range   Hep B E Ab Negative Negative  Hepatitis B e antigen  Result Value Ref Range   Hep B E Ag Negative Negative  Hepatitis C Antibody  Result Value Ref Range   Hep C Virus Ab <0.1 0.0 - 0.9 s/co ratio  Celiac Disease Ab Screen w/Rfx  Result Value Ref Range   Antigliadin Abs, IgA 9 0 - 19 units   Transglutaminase IgA <2 0 - 3 U/mL   IgA/Immunoglobulin A, Serum 351 90 - 386 mg/dL  Alkaline phosphatase, isoenzymes  Result Value Ref Range   Alkaline Phosphatase 134 (H) 39 - 117 IU/L   LIVER FRACTION 66 13 - 88 %   BONE FRACTION 34 12 - 68 %   INTESTINAL FRAC. 0 0 - 18 %  PTH, intact and calcium  Result Value Ref Range   Calcium 9.9 8.7 - 10.2 mg/dL   PTH 38 15 - 65 pg/mL   PTH Interp Comment   Mitochondrial/smooth muscle ab pnl  Result Value Ref Range   Smooth Muscle Ab 3 0 - 19 Units   Mitochondrial Ab <20.0 0.0 - 20.0 Units  Gamma GT  Result Value Ref Range   GGT 58 0 - 65 IU/L  ANA  Result Value Ref Range   Anti Nuclear Antibody (ANA) Negative Negative      Assessment & Plan:   Problem List Items Addressed This Visit      Genitourinary   Kidney stone    Will check KUB to look for stone, based on location of pain more concerned for lumbar pathology.       Other Visit Diagnoses    Acute right-sided low back pain without sciatica    -  Primary   Will treat with naproxen, fleeril and stretches. Obtain x-rays. Recheck 1-2 weeks with PCP. Call with any concerns.    Relevant Medications    cyclobenzaprine (FLEXERIL) 10 MG tablet   naproxen (NAPROSYN) 500 MG tablet   Other Relevant Orders   DG Lumbar Spine Complete   DG Abd 1 View   Dysuria  UA clear. Will treat back pain with naproxen and flexeril and stretches and obtain KUB and lumbar x-ray to look for occult stone.    Relevant Orders   UA/M w/rflx Culture, Routine       Follow up plan: Return 1-2 weeks with Jolene.

## 2019-12-25 NOTE — Patient Instructions (Signed)

## 2019-12-26 ENCOUNTER — Encounter: Payer: Self-pay | Admitting: Family Medicine

## 2019-12-26 DIAGNOSIS — M47816 Spondylosis without myelopathy or radiculopathy, lumbar region: Secondary | ICD-10-CM | POA: Insufficient documentation

## 2020-01-02 ENCOUNTER — Other Ambulatory Visit: Payer: Self-pay

## 2020-01-02 ENCOUNTER — Ambulatory Visit (INDEPENDENT_AMBULATORY_CARE_PROVIDER_SITE_OTHER): Payer: Medicare HMO | Admitting: Nurse Practitioner

## 2020-01-02 ENCOUNTER — Encounter: Payer: Self-pay | Admitting: Nurse Practitioner

## 2020-01-02 VITALS — BP 113/76 | HR 79 | Temp 98.0°F | Wt 152.6 lb

## 2020-01-02 DIAGNOSIS — M545 Low back pain, unspecified: Secondary | ICD-10-CM

## 2020-01-02 DIAGNOSIS — M549 Dorsalgia, unspecified: Secondary | ICD-10-CM | POA: Insufficient documentation

## 2020-01-02 DIAGNOSIS — E785 Hyperlipidemia, unspecified: Secondary | ICD-10-CM

## 2020-01-02 DIAGNOSIS — F17219 Nicotine dependence, cigarettes, with unspecified nicotine-induced disorders: Secondary | ICD-10-CM | POA: Diagnosis not present

## 2020-01-02 DIAGNOSIS — I10 Essential (primary) hypertension: Secondary | ICD-10-CM

## 2020-01-02 MED ORDER — CYCLOBENZAPRINE HCL 10 MG PO TABS: 10.0000 mg | ORAL_TABLET | Freq: Three times a day (TID) | ORAL | 1 refills | Status: DC | PRN

## 2020-01-02 MED ORDER — TAMSULOSIN HCL 0.4 MG PO CAPS: 0.4000 mg | ORAL_CAPSULE | Freq: Every day | ORAL | 1 refills | Status: DC

## 2020-01-02 MED ORDER — NAPROXEN 500 MG PO TABS: 500.0000 mg | ORAL_TABLET | Freq: Two times a day (BID) | ORAL | 3 refills | Status: DC

## 2020-01-02 NOTE — Assessment & Plan Note (Signed)
I have recommended complete cessation of tobacco use. I have discussed various options available for assistance with tobacco cessation including over the counter methods (Nicotine gum, patch and lozenges). We also discussed prescription options (Chantix, Nicotine Inhaler / Nasal Spray). The patient is not interested in pursuing any prescription tobacco cessation options at this time.  

## 2020-01-02 NOTE — Assessment & Plan Note (Signed)
Chronic, ongoing.  Continue current medication regimen and adjust as needed. Lipid panel today. 

## 2020-01-02 NOTE — Assessment & Plan Note (Signed)
Chronic, ongoing.  Reports stable BP at home.  Continue current medication regimen and adjust as needed.  Recommend he monitor BP a few days a week at home and document for provider + focus on DASH diet.  BMP today.  Return to office in 6 months for physical.
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
## 2020-01-02 NOTE — Patient Instructions (Signed)
Acute Back Pain, Adult Acute back pain is sudden and usually short-lived. It is often caused by an injury to the muscles and tissues in the back. The injury may result from:  A muscle or ligament getting overstretched or torn (strained). Ligaments are tissues that connect bones to each other. Lifting something improperly can cause a back strain.  Wear and tear (degeneration) of the spinal disks. Spinal disks are circular tissue that provides cushioning between the bones of the spine (vertebrae).  Twisting motions, such as while playing sports or doing yard work.  A hit to the back.  Arthritis. You may have a physical exam, lab tests, and imaging tests to find the cause of your pain. Acute back pain usually goes away with rest and home care. Follow these instructions at home: Managing pain, stiffness, and swelling  Take over-the-counter and prescription medicines only as told by your health care provider.  Your health care provider may recommend applying ice during the first 24-48 hours after your pain starts. To do this: ? Put ice in a plastic bag. ? Place a towel between your skin and the bag. ? Leave the ice on for 20 minutes, 2-3 times a day.  If directed, apply heat to the affected area as often as told by your health care provider. Use the heat source that your health care provider recommends, such as a moist heat pack or a heating pad. ? Place a towel between your skin and the heat source. ? Leave the heat on for 20-30 minutes. ? Remove the heat if your skin turns bright red. This is especially important if you are unable to feel pain, heat, or cold. You have a greater risk of getting burned. Activity   Do not stay in bed. Staying in bed for more than 1-2 days can delay your recovery.  Sit up and stand up straight. Avoid leaning forward when you sit, or hunching over when you stand. ? If you work at a desk, sit close to it so you do not need to lean over. Keep your chin tucked  in. Keep your neck drawn back, and keep your elbows bent at a right angle. Your arms should look like the letter "L." ? Sit high and close to the steering wheel when you drive. Add lower back (lumbar) support to your car seat, if needed.  Take short walks on even surfaces as soon as you are able. Try to increase the length of time you walk each day.  Do not sit, drive, or stand in one place for more than 30 minutes at a time. Sitting or standing for long periods of time can put stress on your back.  Do not drive or use heavy machinery while taking prescription pain medicine.  Use proper lifting techniques. When you bend and lift, use positions that put less stress on your back: ? Bend your knees. ? Keep the load close to your body. ? Avoid twisting.  Exercise regularly as told by your health care provider. Exercising helps your back heal faster and helps prevent back injuries by keeping muscles strong and flexible.  Work with a physical therapist to make a safe exercise program, as recommended by your health care provider. Do any exercises as told by your physical therapist. Lifestyle  Maintain a healthy weight. Extra weight puts stress on your back and makes it difficult to have good posture.  Avoid activities or situations that make you feel anxious or stressed. Stress and anxiety increase muscle   tension and can make back pain worse. Learn ways to manage anxiety and stress, such as through exercise. General instructions  Sleep on a firm mattress in a comfortable position. Try lying on your side with your knees slightly bent. If you lie on your back, put a pillow under your knees.  Follow your treatment plan as told by your health care provider. This may include: ? Cognitive or behavioral therapy. ? Acupuncture or massage therapy. ? Meditation or yoga. Contact a health care provider if:  You have pain that is not relieved with rest or medicine.  You have increasing pain going down  into your legs or buttocks.  Your pain does not improve after 2 weeks.  You have pain at night.  You lose weight without trying.  You have a fever or chills. Get help right away if:  You develop new bowel or bladder control problems.  You have unusual weakness or numbness in your arms or legs.  You develop nausea or vomiting.  You develop abdominal pain.  You feel faint. Summary  Acute back pain is sudden and usually short-lived.  Use proper lifting techniques. When you bend and lift, use positions that put less stress on your back.  Take over-the-counter and prescription medicines and apply heat or ice as directed by your health care provider. This information is not intended to replace advice given to you by your health care provider. Make sure you discuss any questions you have with your health care provider. Document Revised: 11/19/2018 Document Reviewed: 03/14/2017 Elsevier Patient Education  2020 Elsevier Inc.  

## 2020-01-02 NOTE — Assessment & Plan Note (Signed)
Previously right sided, now reports left.  Suspect musculoskeletal, however has history of kidney stones -- attempted to obtain urine but patient reports he could not do it and would bring sample back (unsure he will).  At this time refills on Flexeril and Naproxen sent.  Would avoid opioids due to past history of abuse.  Obtain CBC and BMP today.  Continue Flomax, refills sent in.  Recommend alternating heat/ice and resting at home.  Return to office for worsening or ongoing pain.

## 2020-01-05 LAB — CBC WITH DIFFERENTIAL/PLATELET
Basophils Absolute: 0.1 10*3/uL (ref 0.0–0.2)
Basos: 2 %
EOS (ABSOLUTE): 0.4 10*3/uL (ref 0.0–0.4)
Eos: 5 %
Hematocrit: 46.7 % (ref 37.5–51.0)
Hemoglobin: 15.5 g/dL (ref 13.0–17.7)
Immature Grans (Abs): 0 10*3/uL (ref 0.0–0.1)
Immature Granulocytes: 0 %
Lymphocytes Absolute: 2.1 10*3/uL (ref 0.7–3.1)
Lymphs: 32 %
MCH: 31.7 pg (ref 26.6–33.0)
MCHC: 33.2 g/dL (ref 31.5–35.7)
MCV: 96 fL (ref 79–97)
Monocytes Absolute: 0.4 10*3/uL (ref 0.1–0.9)
Monocytes: 6 %
Neutrophils Absolute: 3.7 10*3/uL (ref 1.4–7.0)
Neutrophils: 55 %
Platelets: 355 10*3/uL (ref 150–450)
RBC: 4.89 x10E6/uL (ref 4.14–5.80)
RDW: 12.3 % (ref 11.6–15.4)
WBC: 6.6 10*3/uL (ref 3.4–10.8)

## 2020-01-05 LAB — BASIC METABOLIC PANEL
BUN/Creatinine Ratio: 11 (ref 9–20)
BUN: 12 mg/dL (ref 6–24)
CO2: 20 mmol/L (ref 20–29)
Calcium: 9.7 mg/dL (ref 8.7–10.2)
Chloride: 98 mmol/L (ref 96–106)
Creatinine, Ser: 1.1 mg/dL (ref 0.76–1.27)
GFR calc Af Amer: 84 mL/min/{1.73_m2} (ref >59)
GFR calc non Af Amer: 73 mL/min/{1.73_m2} (ref >59)
Glucose: 86 mg/dL (ref 65–99)
Potassium: 5.2 mmol/L (ref 3.5–5.2)
Sodium: 136 mmol/L (ref 134–144)

## 2020-01-05 LAB — LIPID PANEL W/O CHOL/HDL RATIO
Cholesterol, Total: 148 mg/dL (ref 100–199)
HDL: 46 mg/dL (ref >39)
LDL Chol Calc (NIH): 80 mg/dL (ref 0–99)
Triglycerides: 125 mg/dL (ref 0–149)
VLDL Cholesterol Cal: 22 mg/dL (ref 5–40)

## 2020-01-06 NOTE — Progress Notes (Signed)
Please let Larnce know his labs have returned and overall everything continues to be in good ranges.  Continue current medication regimen at home.  Have a great day!!

## 2020-02-09 NOTE — Progress Notes (Signed)
This encounter was created in error - please disregard.

## 2020-02-17 ENCOUNTER — Other Ambulatory Visit: Payer: Self-pay | Admitting: Nurse Practitioner

## 2020-02-17 NOTE — Telephone Encounter (Signed)
Last OV: 01/02/20

## 2020-02-17 NOTE — Telephone Encounter (Signed)
Requested medication (s) are due for refill today: yes  Requested medication (s) are on the active medication list:yes  Last refill:  01/02/20  #45  1 refill  Future visit scheduled:01/2021  Notes to clinic:  Medication not delegated    Requested Prescriptions  Pending Prescriptions Disp Refills   cyclobenzaprine (FLEXERIL) 10 MG tablet [Pharmacy Med Name: CYCLOBENZAPRINE 10 MG TABLET] 45 tablet 1    Sig: TAKE 1 TABLET BY MOUTH THREE TIMES A DAY AS NEEDED FOR MUSCLE SPASMS      Not Delegated - Analgesics:  Muscle Relaxants Failed - 02/17/2020  9:02 AM      Failed - This refill cannot be delegated      Passed - Valid encounter within last 6 months    Recent Outpatient Visits           1 month ago Acute left-sided low back pain without sciatica   [facility] [place], Jolene T, NP   1 month ago Acute right-sided low back pain without sciatica   [facility], Megan P, DO   6 months ago Essential hypertension   [facility] [address], [place] T, NP   8 months ago Abnormal weight loss   [facility] [place], [place] T, NP   9 months ago Centrilobular emphysema (HCC)   [facility] [place], Dorie Rank, NP

## 2020-03-17 ENCOUNTER — Other Ambulatory Visit: Payer: Self-pay | Admitting: Nurse Practitioner

## 2020-03-17 DIAGNOSIS — I10 Essential (primary) hypertension: Secondary | ICD-10-CM

## 2020-04-03 ENCOUNTER — Other Ambulatory Visit: Payer: Self-pay | Admitting: Nurse Practitioner

## 2020-04-03 NOTE — Telephone Encounter (Signed)
Requested medication (s) are due for refill today: yes  Requested medication (s) are on the active medication list: yes  Last refill:  02/17/20  Future visit scheduled: no  Notes to clinic:  med not delegated to NT to RF   Requested Prescriptions  Pending Prescriptions Disp Refills   cyclobenzaprine (FLEXERIL) 10 MG tablet [Pharmacy Med Name: CYCLOBENZAPRINE 10 MG TABLET] 45 tablet 1    Sig: TAKE 1 TABLET BY MOUTH THREE TIMES A DAY AS NEEDED FOR MUSCLE SPASMS      Not Delegated - Analgesics:  Muscle Relaxants Failed - 04/03/2020  1:03 PM      Failed - This refill cannot be delegated      Passed - Valid encounter within last 6 months    Recent Outpatient Visits           3 months ago Acute left-sided low back pain without sciatica   [facility] [place], Jolene T, NP   3 months ago Acute right-sided low back pain without sciatica   [facility], Megan P, DO   8 months ago Essential hypertension   [facility] [place], [place] T, NP   9 months ago Abnormal weight loss   [facility], [place] T, NP   11 months ago Centrilobular emphysema (HCC)   [facility] [place], Dorie Rank, NP

## 2020-04-17 ENCOUNTER — Other Ambulatory Visit: Payer: Self-pay | Admitting: Nurse Practitioner

## 2020-04-17 DIAGNOSIS — I10 Essential (primary) hypertension: Secondary | ICD-10-CM

## 2020-04-17 NOTE — Telephone Encounter (Signed)
Requested Prescriptions  Pending Prescriptions Disp Refills  . citalopram (CELEXA) 20 MG tablet [Pharmacy Med Name: CITALOPRAM HBR 20 MG TABLET] 90 tablet 0    Sig: TAKE 1 TABLET BY MOUTH EVERY DAY     Psychiatry:  Antidepressants - SSRI Passed - 04/17/2020 12:39 AM      Passed - Completed PHQ-2 or PHQ-9 in the last 360 days.      Passed - Valid encounter within last 6 months    Recent Outpatient Visits          3 months ago Acute left-sided low back pain without sciatica   [facility] [place], Jolene T, NP   3 months ago Acute right-sided low back pain without sciatica   [facility], Megan P, DO   8 months ago Essential hypertension   [facility] [place], Kinde T, NP   10 months ago Abnormal weight loss   [facility], [place] T, NP   11 months ago Centrilobular emphysema (HCC)   [facility] [place], [place] T, NP             . metoprolol tartrate (LOPRESSOR) 25 MG tablet [Pharmacy Med Name: METOPROLOL TARTRATE 25 MG TAB] 180 tablet 1    Sig: TAKE 1 TABLET BY MOUTH TWICE A DAY     Cardiovascular:  Beta Blockers Passed - 04/17/2020 12:39 AM      Passed - Last BP in normal range    BP Readings from Last 1 Encounters:  01/02/20 113/76         Passed - Last Heart Rate in normal range    Pulse Readings from Last 1 Encounters:  01/02/20 79         Passed - Valid encounter within last 6 months    Recent Outpatient Visits          3 months ago Acute left-sided low back pain without sciatica   [facility] [place], Jolene T, NP   3 months ago Acute right-sided low back pain without sciatica   [facility], Megan P, DO   8 months ago Essential hypertension   [facility] [place], [place] T, NP   10 months ago Abnormal weight loss   [facility], Amorita T, NP   11 months ago Centrilobular emphysema (HCC)   [facility]  [place], Dorie Rank, NP

## 2020-04-28 ENCOUNTER — Telehealth: Payer: Self-pay

## 2020-04-28 NOTE — Telephone Encounter (Signed)
Contacted patient for lung CT screening clinic after receiving referral from [facility].  Patient is willing to participate and has been scheduled for CT for Tuesday, Sept 28 at 1:45.  Patient is a current smoker who started smoking at age 59.  He smokes 1/2 to 1 pack a day, he states closer to the 1 pack.  He states he does not have a ride to the scan.  I have let Glenna Fellows, lung navigator know that patient will need a ride to appointment.

## 2020-04-29 ENCOUNTER — Other Ambulatory Visit: Payer: Self-pay | Admitting: *Deleted

## 2020-04-29 DIAGNOSIS — Z122 Encounter for screening for malignant neoplasm of respiratory organs: Secondary | ICD-10-CM

## 2020-04-29 DIAGNOSIS — Z87891 Personal history of nicotine dependence: Secondary | ICD-10-CM

## 2020-04-29 NOTE — Progress Notes (Signed)
Current smoker, 43 pack year °

## 2020-05-11 ENCOUNTER — Encounter: Payer: Self-pay | Admitting: Nurse Practitioner

## 2020-05-11 ENCOUNTER — Other Ambulatory Visit: Payer: Self-pay

## 2020-05-11 ENCOUNTER — Inpatient Hospital Stay: Payer: Medicare HMO | Attending: Nurse Practitioner | Admitting: Nurse Practitioner

## 2020-05-11 ENCOUNTER — Ambulatory Visit
Admission: RE | Admit: 2020-05-11 | Discharge: 2020-05-11 | Disposition: A | Discharge status: Home / Self Care | Payer: Medicare HMO | Source: Ambulatory Visit | Attending: Nurse Practitioner | Admitting: Nurse Practitioner

## 2020-05-11 DIAGNOSIS — Z87891 Personal history of nicotine dependence: Secondary | ICD-10-CM | POA: Diagnosis not present

## 2020-05-11 DIAGNOSIS — Z122 Encounter for screening for malignant neoplasm of respiratory organs: Secondary | ICD-10-CM

## 2020-05-11 DIAGNOSIS — F17210 Nicotine dependence, cigarettes, uncomplicated: Secondary | ICD-10-CM | POA: Diagnosis not present

## 2020-05-11 NOTE — Progress Notes (Signed)
Virtual Visit via Video Enabled Telemedicine Note   I connected with Mark [name] on 05/11/20 at 1:30 PM EST by video enabled telemedicine visit and verified that I am speaking with the correct person using two identifiers.   I discussed the limitations, risks, security and privacy concerns of performing an evaluation and management service by telemedicine and the availability of in-person appointments. I also discussed with the patient that there may be a patient responsible charge related to this service. The patient expressed understanding and agreed to proceed.   Other persons participating in the visit and their role in the encounter: Burgess Estelle, RN- checking in patient & navigation  Patient's location: [place]  Provider's location: Clinic  Chief Complaint: Low Dose CT Screening  Patient agreed to evaluation by telemedicine to discuss shared decision making for consideration of low dose CT lung cancer screening.    In accordance with CMS guidelines, patient has met eligibility criteria including age, absence of signs or symptoms of lung cancer.  Social History   Tobacco Use  . Smoking status: Current Every Day Smoker    Packs/day: 1.00    Years: 43.00    Pack years: 43.00  . Smokeless tobacco: Never Used  Substance Use Topics  . Alcohol use: Not Currently    Alcohol/week: 0.0 standard drinks     A shared decision-making session was conducted prior to the performance of CT scan. This includes one or more decision aids, includes benefits and harms of screening, follow-up diagnostic testing, over-diagnosis, false positive rate, and total radiation exposure.   Counseling on the importance of adherence to annual lung cancer LDCT screening, impact of co-morbidities, and ability or willingness to undergo diagnosis and treatment is imperative for compliance of the program.   Counseling on the importance of continued smoking cessation for former smokers; the importance  of smoking cessation for current smokers, and information about tobacco cessation interventions have been given to patient including [place] and [employer] [place] programs.   Written order for lung cancer screening with LDCT has been given to the patient and any and all questions have been answered to the best of my abilities.    Yearly follow up will be coordinated by Burgess Estelle, Thoracic Navigator.  I discussed the assessment and treatment plan with the patient. The patient was provided an opportunity to ask questions and all were answered. The patient agreed with the plan and demonstrated an understanding of the instructions.   The patient was advised to call back or seek an in-person evaluation if the symptoms worsen or if the condition fails to improve as anticipated.   I provided 15 minutes of face-to-face video visit time during this encounter, and > 50% was spent counseling as documented under my assessment & plan.   Beckey Rutter, DNP, AGNP-C [place] at [facility] [phone] (clinic)

## 2020-05-13 ENCOUNTER — Encounter: Payer: Self-pay | Admitting: Nurse Practitioner

## 2020-05-13 ENCOUNTER — Encounter: Payer: Self-pay | Admitting: *Deleted

## 2020-05-13 DIAGNOSIS — I700 Atherosclerosis of aorta: Secondary | ICD-10-CM | POA: Insufficient documentation

## 2020-06-10 ENCOUNTER — Other Ambulatory Visit: Payer: Self-pay | Admitting: Nurse Practitioner

## 2020-06-10 DIAGNOSIS — I10 Essential (primary) hypertension: Secondary | ICD-10-CM

## 2020-06-18 ENCOUNTER — Telehealth: Payer: Self-pay | Admitting: Nurse Practitioner

## 2020-06-18 NOTE — Telephone Encounter (Signed)
Patient would like the doctor or nurse to call him regarding a referral for a driver evaluation.  Patient did not want to make an appt. But wanted to get the referral so he could be cleared to drive.  Patient would like a call back to discuss at [phone]

## 2020-06-18 NOTE — Telephone Encounter (Signed)
Noted  

## 2020-06-18 NOTE — Telephone Encounter (Signed)
Scheduled for 11/10

## 2020-06-23 ENCOUNTER — Encounter: Payer: Self-pay | Admitting: Nurse Practitioner

## 2020-06-23 ENCOUNTER — Ambulatory Visit (INDEPENDENT_AMBULATORY_CARE_PROVIDER_SITE_OTHER): Payer: Medicare HMO | Admitting: Nurse Practitioner

## 2020-06-23 ENCOUNTER — Other Ambulatory Visit: Payer: Self-pay

## 2020-06-23 VITALS — BP 112/78 | HR 71 | Temp 98.2°F | Wt 158.0 lb

## 2020-06-23 DIAGNOSIS — Z8673 Personal history of transient ischemic attack (TIA), and cerebral infarction without residual deficits: Secondary | ICD-10-CM | POA: Diagnosis not present

## 2020-06-23 DIAGNOSIS — I700 Atherosclerosis of aorta: Secondary | ICD-10-CM

## 2020-06-23 DIAGNOSIS — E785 Hyperlipidemia, unspecified: Secondary | ICD-10-CM | POA: Diagnosis not present

## 2020-06-23 DIAGNOSIS — R7309 Other abnormal glucose: Secondary | ICD-10-CM | POA: Diagnosis not present

## 2020-06-23 DIAGNOSIS — I10 Essential (primary) hypertension: Secondary | ICD-10-CM

## 2020-06-23 DIAGNOSIS — R911 Solitary pulmonary nodule: Secondary | ICD-10-CM

## 2020-06-23 DIAGNOSIS — J438 Other emphysema: Secondary | ICD-10-CM | POA: Diagnosis not present

## 2020-06-23 DIAGNOSIS — R7303 Prediabetes: Secondary | ICD-10-CM | POA: Diagnosis not present

## 2020-06-23 DIAGNOSIS — F17219 Nicotine dependence, cigarettes, with unspecified nicotine-induced disorders: Secondary | ICD-10-CM

## 2020-06-23 DIAGNOSIS — R748 Abnormal levels of other serum enzymes: Secondary | ICD-10-CM

## 2020-06-23 NOTE — Assessment & Plan Note (Signed)
History of CVA x 2 in 2014.  He requests referral to OT at [facility].  Reports DMV states he needs to go here for driver's assessment to ensure he can obtain license again.  Referral in place.

## 2020-06-23 NOTE — Assessment & Plan Note (Signed)
Chronic, ongoing.  Continue current medication regimen and adjust as needed. Lipid panel today. 

## 2020-06-23 NOTE — Progress Notes (Signed)
BP 112/78    Pulse 71    Temp 98.2 F (36.8 C)    Wt 158 lb (71.7 kg)    SpO2 97%    BMI 23.33 kg/m    Subjective:    Patient ID: Mark [name], male    DOB: Mar 18, 1961, 59 y.o.   MRN: [id number]  HPI: Mark [name] is a 59 y.o. male  Chief Complaint  Patient presents with   driver evalution    pt requesting referral for driver evaluation    CT results    pt states he did not receive results from imaging done at the end of september    HYPERTENSION / HYPERLIPIDEMIA Continues on Amlodipine and Metoprolol daily.  Has history of CVA x 2 in 2014 and is requesting referral to someone for driver evaluation so he can drive again.  Has history of elevation in LFT due to alcohol intake, recent in September 2020 -- ALT/AST 21/23 (normal).  Last A1C 5.5%, has history of elevation in this in past at 5.8% in 2019. Satisfied with current treatment? yes Duration of hypertension: chronic BP monitoring frequency: not checking BP range:  BP medication side effects: no Duration of hyperlipidemia: chronic Cholesterol medication side effects: no Cholesterol supplements: none Medication compliance: good compliance Aspirin: yes Recent stressors: no Recurrent headaches: no Visual changes: no Palpitations: no Dyspnea: no Chest pain: no Lower extremity edema: no Dizzy/lightheaded: no   COPD Had lung CT screening in September 2021 -- noted emphysema and aortic atherosclerosis + a 2.8 cm left lower lobe nodule -- to continue yearly screening.  Smokes about 1 PPD, has been smoking since age 47.  No current inhalers or symptoms.   COPD status: stable Satisfied with current treatment?: yes Oxygen use: no Dyspnea frequency: none Cough frequency: none Rescue inhaler frequency:  none Limitation of activity: no Productive cough: none Last Spirometry: none Pneumovax: Not up to Date Influenza: refuses  Relevant past medical, surgical, family and social history reviewed and updated as  indicated. Interim medical history since our last visit reviewed. Allergies and medications reviewed and updated.  Review of Systems  Constitutional: Negative for activity change, diaphoresis, fatigue and fever.  Respiratory: Negative for cough, chest tightness, shortness of breath and wheezing.   Cardiovascular: Negative for chest pain, palpitations and leg swelling.  Gastrointestinal: Negative.   Neurological: Negative.   Psychiatric/Behavioral: Negative.     Per HPI unless specifically indicated above     Objective:    BP 112/78    Pulse 71    Temp 98.2 F (36.8 C)    Wt 158 lb (71.7 kg)    SpO2 97%    BMI 23.33 kg/m   Wt Readings from Last 3 Encounters:  06/23/20 158 lb (71.7 kg)  05/11/20 160 lb (72.6 kg)  02/09/20 160 lb (72.6 kg)    Physical Exam Vitals and nursing note reviewed.  Constitutional:      General: He is awake. He is not in acute distress.    Appearance: He is well-developed and well-groomed. He is not ill-appearing.  HENT:     Head: Normocephalic and atraumatic.     Right Ear: Hearing normal. No drainage.     Left Ear: Hearing normal. No drainage.  Eyes:     General: Lids are normal.        Right eye: No discharge.        Left eye: No discharge.     Conjunctiva/sclera: Conjunctivae normal.     Pupils:  Pupils are equal, round, and reactive to light.  Neck:     Thyroid: No thyromegaly.     Vascular: No carotid bruit.     Trachea: Trachea normal.  Cardiovascular:     Rate and Rhythm: Normal rate and regular rhythm.     Heart sounds: Normal heart sounds, S1 normal and S2 normal. No murmur heard.  No gallop.   Pulmonary:     Effort: Pulmonary effort is normal. No accessory muscle usage or respiratory distress.     Breath sounds: Normal breath sounds.  Abdominal:     General: Bowel sounds are normal. There is no distension.     Palpations: Abdomen is soft. There is no hepatomegaly or splenomegaly.     Tenderness: There is no abdominal tenderness.    Musculoskeletal:        General: Normal range of motion.     Cervical back: Normal range of motion and neck supple.     Right lower leg: No edema.     Left lower leg: No edema.  Skin:    General: Skin is warm and dry.     Capillary Refill: Capillary refill takes less than 2 seconds.     Findings: No rash.     Comments: Scattered pale purple bruises bilateral upper extremities.  Neurological:     Mental Status: He is alert and oriented to person, place, and time.     Deep Tendon Reflexes: Reflexes are normal and symmetric.  Psychiatric:        Attention and Perception: Attention normal.        Mood and Affect: Mood normal.        Speech: Speech normal.        Behavior: Behavior normal. Behavior is cooperative.        Thought Content: Thought content normal.     Results for orders placed or performed in visit on 01/02/20  Basic metabolic panel  Result Value Ref Range   Glucose 86 65 - 99 mg/dL   BUN 12 6 - 24 mg/dL   Creatinine, Ser 5.36 0.76 - 1.27 mg/dL   GFR calc non Af Amer 73 >59 mL/min/1.73   GFR calc Af Amer 84 >59 mL/min/1.73   BUN/Creatinine Ratio 11 9 - 20   Sodium 136 134 - 144 mmol/L   Potassium 5.2 3.5 - 5.2 mmol/L   Chloride 98 96 - 106 mmol/L   CO2 20 20 - 29 mmol/L   Calcium 9.7 8.7 - 10.2 mg/dL  CBC with Differential/Platelet  Result Value Ref Range   WBC 6.6 3.4 - 10.8 x10E3/uL   RBC 4.89 4.14 - 5.80 x10E6/uL   Hemoglobin 15.5 13.0 - 17.7 g/dL   Hematocrit 64.4 03.4 - 51.0 %   MCV 96 79 - 97 fL   MCH 31.7 26.6 - 33.0 pg   MCHC 33.2 31 - 35 g/dL   RDW 74.2 59.5 - 63.8 %   Platelets 355 150 - 450 x10E3/uL   Neutrophils 55 Not Estab. %   Lymphs 32 Not Estab. %   Monocytes 6 Not Estab. %   Eos 5 Not Estab. %   Basos 2 Not Estab. %   Neutrophils Absolute 3.7 1.40 - 7.00 x10E3/uL   Lymphocytes Absolute 2.1 0 - 3 x10E3/uL   Monocytes Absolute 0.4 0 - 0 x10E3/uL   EOS (ABSOLUTE) 0.4 0.0 - 0.4 x10E3/uL   Basophils Absolute 0.1 0 - 0 x10E3/uL    Immature Granulocytes 0 Not Estab. %  Immature Grans (Abs) 0.0 0.0 - 0.1 x10E3/uL  Lipid Panel w/o Chol/HDL Ratio  Result Value Ref Range   Cholesterol, Total 148 100 - 199 mg/dL   Triglycerides 947 0 - 149 mg/dL   HDL 46 >65 mg/dL   VLDL Cholesterol Cal 22 5 - 40 mg/dL   LDL Chol Calc (NIH) 80 0 - 99 mg/dL      Assessment & Plan:   Problem List Items Addressed This Visit      Cardiovascular and Mediastinum   Hypertension    Chronic, ongoing.  Reports stable BP at home and below goal in office.  Continue current medication regimen and adjust as needed.  Recommend he monitor BP a few days a week at home and document for provider + focus on DASH diet.  BMP and TSH today.  Return to office in 6 months.      Relevant Orders   TSH   Comprehensive metabolic panel   Aortic atherosclerosis (HCC)    Noted on past lung screening.  Educated patient on this.  Recommend complete cessation of smoking + continue daily statin & ASA for prevention.        Respiratory   Paraseptal emphysema (HCC) - Primary    Chronic, no current inhalers or symptoms.  Long time smoker.  Have recommended complete cessation.  Will plan on spirometry at upcoming visits.  Continue yearly lung CT CA screening.  Initiate inhaler regimen as needed.  CBC today.      Relevant Orders   CBC with Differential/Platelet     Nervous and Auditory   Nicotine dependence, cigarettes, w unsp disorders    I have recommended complete cessation of tobacco use. I have discussed various options available for assistance with tobacco cessation including over the counter methods (Nicotine gum, patch and lozenges). We also discussed prescription options (Chantix, Nicotine Inhaler / Nasal Spray). The patient is not interested in pursuing any prescription tobacco cessation options at this time.         Other   History of CVA (cerebrovascular accident)    History of CVA x 2 in 2014.  He requests referral to OT at [facility].  Reports DMV  states he needs to go here for driver's assessment to ensure he can obtain license again.  Referral in place.      Relevant Orders   Ambulatory referral to Occupational Therapy   Elevated hemoglobin A1c    Noted on past labs in 2019 at 5.8%, last check was improved at 5.5%.  Recheck today.      Relevant Orders   HgB A1c   Hyperlipidemia LDL goal <70    Chronic, ongoing.  Continue current medication regimen and adjust as needed.  Lipid panel today.      Relevant Orders   Lipid Panel w/o Chol/HDL Ratio   Elevated liver enzymes    Recheck levels today to ensure staying within normal ranges.      Relevant Orders   Comprehensive metabolic panel   Pulmonary nodule    Continue yearly lung screening to monitor -- recommend complete cessation of smoking.          Follow up plan: Return in about 6 months (around 12/21/2020) for COPD, HTN/HLD -- needs spirometry.

## 2020-06-23 NOTE — Assessment & Plan Note (Signed)
Recheck levels today to ensure staying within normal ranges.

## 2020-06-23 NOTE — Assessment & Plan Note (Addendum)
Chronic, no current inhalers or symptoms.  Long time smoker.  Have recommended complete cessation.  Will plan on spirometry at upcoming visits.  Continue yearly lung CT CA screening.  Initiate inhaler regimen as needed.  CBC today. 

## 2020-06-23 NOTE — Assessment & Plan Note (Signed)
I have recommended complete cessation of tobacco use. I have discussed various options available for assistance with tobacco cessation including over the counter methods (Nicotine gum, patch and lozenges). We also discussed prescription options (Chantix, Nicotine Inhaler / Nasal Spray). The patient is not interested in pursuing any prescription tobacco cessation options at this time.  

## 2020-06-23 NOTE — Assessment & Plan Note (Signed)
Continue yearly lung screening to monitor -- recommend complete cessation of smoking.

## 2020-06-23 NOTE — Assessment & Plan Note (Signed)
Chronic, ongoing.  Reports stable BP at home and below goal in office.  Continue current medication regimen and adjust as needed.  Recommend he monitor BP a few days a week at home and document for provider + focus on DASH diet.  BMP and TSH today.  Return to office in 6 months.

## 2020-06-23 NOTE — Assessment & Plan Note (Signed)
Noted on past labs in 2019 at 5.8%, last check was improved at 5.5%.  Recheck today.

## 2020-06-23 NOTE — Assessment & Plan Note (Signed)
Noted on past lung screening.  Educated patient on this.  Recommend complete cessation of smoking + continue daily statin & ASA for prevention. 

## 2020-06-23 NOTE — Patient Instructions (Signed)
Chronic Obstructive Pulmonary Disease Chronic obstructive pulmonary disease (COPD) is a long-term (chronic) lung problem. When you have COPD, it is hard for air to get in and out of your lungs. Usually the condition gets worse over time, and your lungs will never return to normal. There are things you can do to keep yourself as healthy as possible.  Your doctor may treat your condition with: ? Medicines. ? Oxygen. ? Lung surgery.  Your doctor may also recommend: ? Rehabilitation. This includes steps to make your body work better. It may involve a team of specialists. ? Quitting smoking, if you smoke. ? Exercise and changes to your diet. ? Comfort measures (palliative care). Follow these instructions at home: Medicines  Take over-the-counter and prescription medicines only as told by your doctor.  Talk to your doctor before taking any cough or allergy medicines. You may need to avoid medicines that cause your lungs to be dry. Lifestyle  If you smoke, stop. Smoking makes the problem worse. If you need help quitting, ask your doctor.  Avoid being around things that make your breathing worse. This may include smoke, chemicals, and fumes.  Stay active, but remember to rest as well.  Learn and use tips on how to relax.  Make sure you get enough sleep. Most adults need at least 7 hours of sleep every night.  Eat healthy foods. Eat smaller meals more often. Rest before meals. Controlled breathing Learn and use tips on how to control your breathing as told by your doctor. Try:  Breathing in (inhaling) through your nose for 1 second. Then, pucker your lips and breath out (exhale) through your lips for 2 seconds.  Putting one hand on your belly (abdomen). Breathe in slowly through your nose for 1 second. Your hand on your belly should move out. Pucker your lips and breathe out slowly through your lips. Your hand on your belly should move in as you breathe out.  Controlled coughing Learn  and use controlled coughing to clear mucus from your lungs. Follow these steps: 1. Lean your head a little forward. 2. Breathe in deeply. 3. Try to hold your breath for 3 seconds. 4. Keep your mouth slightly open while coughing 2 times. 5. Spit any mucus out into a tissue. 6. Rest and do the steps again 1 or 2 times as needed. General instructions  Make sure you get all the shots (vaccines) that your doctor recommends. Ask your doctor about a flu shot and a pneumonia shot.  Use oxygen therapy and pulmonary rehabilitation if told by your doctor. If you need home oxygen therapy, ask your doctor if you should buy a tool to measure your oxygen level (oximeter).  Make a COPD action plan with your doctor. This helps you to know what to do if you feel worse than usual.  Manage any other conditions you have as told by your doctor.  Avoid going outside when it is very hot, cold, or humid.  Avoid people who have a sickness you can catch (contagious).  Keep all follow-up visits as told by your doctor. This is important. Contact a doctor if:  You cough up more mucus than usual.  There is a change in the color or thickness of the mucus.  It is harder to breathe than usual.  Your breathing is faster than usual.  You have trouble sleeping.  You need to use your medicines more often than usual.  You have trouble doing your normal activities such as getting dressed   or walking around the house. Get help right away if:  You have shortness of breath while resting.  You have shortness of breath that stops you from: ? Being able to talk. ? Doing normal activities.  Your chest hurts for longer than 5 minutes.  Your skin color is more blue than usual.  Your pulse oximeter shows that you have low oxygen for longer than 5 minutes.  You have a fever.  You feel too tired to breathe normally. Summary  Chronic obstructive pulmonary disease (COPD) is a long-term lung problem.  The way your  lungs work will never return to normal. Usually the condition gets worse over time. There are things you can do to keep yourself as healthy as possible.  Take over-the-counter and prescription medicines only as told by your doctor.  If you smoke, stop. Smoking makes the problem worse. This information is not intended to replace advice given to you by your health care provider. Make sure you discuss any questions you have with your health care provider. Document Revised: 07/13/2017 Document Reviewed: 09/04/2016 Elsevier Patient Education  2020 [employer]  

## 2020-06-24 ENCOUNTER — Other Ambulatory Visit: Payer: Self-pay | Admitting: Nurse Practitioner

## 2020-06-24 DIAGNOSIS — R748 Abnormal levels of other serum enzymes: Secondary | ICD-10-CM

## 2020-06-24 LAB — CBC WITH DIFFERENTIAL/PLATELET
Basophils Absolute: 0.1 10*3/uL (ref 0.0–0.2)
Basos: 2 %
EOS (ABSOLUTE): 0.2 10*3/uL (ref 0.0–0.4)
Eos: 3 %
Hematocrit: 43.9 % (ref 37.5–51.0)
Hemoglobin: 14.9 g/dL (ref 13.0–17.7)
Immature Grans (Abs): 0 10*3/uL (ref 0.0–0.1)
Immature Granulocytes: 0 %
Lymphocytes Absolute: 2.2 10*3/uL (ref 0.7–3.1)
Lymphs: 31 %
MCH: 33 pg (ref 26.6–33.0)
MCHC: 33.9 g/dL (ref 31.5–35.7)
MCV: 97 fL (ref 79–97)
Monocytes Absolute: 0.5 10*3/uL (ref 0.1–0.9)
Monocytes: 7 %
Neutrophils Absolute: 4.1 10*3/uL (ref 1.4–7.0)
Neutrophils: 57 %
Platelets: 349 10*3/uL (ref 150–450)
RBC: 4.51 x10E6/uL (ref 4.14–5.80)
RDW: 12.9 % (ref 11.6–15.4)
WBC: 7.1 10*3/uL (ref 3.4–10.8)

## 2020-06-24 LAB — COMPREHENSIVE METABOLIC PANEL
ALT: 87 IU/L — ABNORMAL HIGH (ref 0–44)
AST: 170 IU/L — ABNORMAL HIGH (ref 0–40)
Albumin/Globulin Ratio: 1.6 (ref 1.2–2.2)
Albumin: 4.4 g/dL (ref 3.8–4.9)
Alkaline Phosphatase: 133 IU/L — ABNORMAL HIGH (ref 44–121)
BUN/Creatinine Ratio: 7 — ABNORMAL LOW (ref 10–24)
BUN: 7 mg/dL — ABNORMAL LOW (ref 8–27)
Bilirubin Total: 0.5 mg/dL (ref 0.0–1.2)
CO2: 25 mmol/L (ref 20–29)
Calcium: 9.2 mg/dL (ref 8.6–10.2)
Chloride: 99 mmol/L (ref 96–106)
Creatinine, Ser: 0.95 mg/dL (ref 0.76–1.27)
GFR calc Af Amer: 100 mL/min/{1.73_m2} (ref >59)
GFR calc non Af Amer: 87 mL/min/{1.73_m2} (ref >59)
Globulin, Total: 2.8 g/dL (ref 1.5–4.5)
Glucose: 86 mg/dL (ref 65–99)
Potassium: 5.4 mmol/L — ABNORMAL HIGH (ref 3.5–5.2)
Sodium: 136 mmol/L (ref 134–144)
Total Protein: 7.2 g/dL (ref 6.0–8.5)

## 2020-06-24 LAB — HEMOGLOBIN A1C
Est. average glucose Bld gHb Est-mCnc: 114 mg/dL
Hgb A1c MFr Bld: 5.6 % (ref 4.8–5.6)

## 2020-06-24 LAB — LIPID PANEL W/O CHOL/HDL RATIO
Cholesterol, Total: 142 mg/dL (ref 100–199)
HDL: 52 mg/dL (ref >39)
LDL Chol Calc (NIH): 73 mg/dL (ref 0–99)
Triglycerides: 87 mg/dL (ref 0–149)
VLDL Cholesterol Cal: 17 mg/dL (ref 5–40)

## 2020-06-24 LAB — TSH: TSH: 2.67 u[IU]/mL (ref 0.450–4.500)

## 2020-06-24 NOTE — Progress Notes (Signed)
Good morning, please let Mark [name] know his labs have returned and overall look great with exception of his liver function tests which are quite elevated again with AST 170 and ALT 87, the high normal for these is 40 range.  If he is drinking alcohol or using any medications with Tylenol I highly recommend he cut back immediately to avoid further damage to liver.  I would like him to come back in 4 weeks to recheck this via lab visit only, if ongoing elevation I will want to refer him to GI providers for further assessment.  Please schedule lab visit for 4 weeks.  Any questions? Keep being awesome!!  Thank you for allowing me to participate in your care. Kindest regards, Myalee Stengel

## 2020-07-12 ENCOUNTER — Other Ambulatory Visit: Payer: Self-pay | Admitting: Nurse Practitioner

## 2020-07-12 DIAGNOSIS — I10 Essential (primary) hypertension: Secondary | ICD-10-CM

## 2020-07-29 ENCOUNTER — Other Ambulatory Visit: Payer: Medicare HMO

## 2020-07-30 ENCOUNTER — Other Ambulatory Visit: Payer: Medicare HMO

## 2020-07-30 ENCOUNTER — Other Ambulatory Visit: Payer: Self-pay

## 2020-07-30 DIAGNOSIS — R748 Abnormal levels of other serum enzymes: Secondary | ICD-10-CM

## 2020-07-31 LAB — HEPATIC FUNCTION PANEL
ALT: 47 IU/L — ABNORMAL HIGH (ref 0–44)
AST: 31 IU/L (ref 0–40)
Albumin: 4.6 g/dL (ref 3.8–4.9)
Alkaline Phosphatase: 113 IU/L (ref 44–121)
Bilirubin Total: 0.4 mg/dL (ref 0.0–1.2)
Bilirubin, Direct: 0.12 mg/dL (ref 0.00–0.40)
Total Protein: 7.4 g/dL (ref 6.0–8.5)

## 2020-07-31 LAB — GAMMA GT: GGT: 72 IU/L — ABNORMAL HIGH (ref 0–65)

## 2020-08-01 NOTE — Progress Notes (Signed)
Good morning, please let Stanford know his labs have returned and his liver function testing has come back down with only mild elevation in a couple of the tests.  I do recommend continuing to avoid alcohol and Tylenol use.  I also recommend he consider having a colonoscopy for further evaluation.  If any questions let me know.  And if you would like a referral to GI for a colonoscopy please let me know.  Have a great day!! Keep being awesome!!  Thank you for allowing me to participate in your care. Kindest regards, Emani Morad

## 2020-08-12 ENCOUNTER — Other Ambulatory Visit: Payer: Self-pay | Admitting: Nurse Practitioner

## 2020-09-01 ENCOUNTER — Ambulatory Visit (INDEPENDENT_AMBULATORY_CARE_PROVIDER_SITE_OTHER): Payer: Medicare HMO | Admitting: Nurse Practitioner

## 2020-09-01 ENCOUNTER — Other Ambulatory Visit: Payer: Self-pay

## 2020-09-01 ENCOUNTER — Other Ambulatory Visit: Payer: Self-pay | Admitting: Nurse Practitioner

## 2020-09-01 ENCOUNTER — Encounter: Payer: Self-pay | Admitting: Nurse Practitioner

## 2020-09-01 VITALS — BP 94/69 | HR 89 | Temp 98.9°F | Wt 152.0 lb

## 2020-09-01 DIAGNOSIS — R195 Other fecal abnormalities: Secondary | ICD-10-CM | POA: Insufficient documentation

## 2020-09-01 DIAGNOSIS — F17219 Nicotine dependence, cigarettes, with unspecified nicotine-induced disorders: Secondary | ICD-10-CM | POA: Diagnosis not present

## 2020-09-01 DIAGNOSIS — R197 Diarrhea, unspecified: Secondary | ICD-10-CM | POA: Diagnosis not present

## 2020-09-01 DIAGNOSIS — R5383 Other fatigue: Secondary | ICD-10-CM | POA: Diagnosis not present

## 2020-09-01 DIAGNOSIS — K922 Gastrointestinal hemorrhage, unspecified: Secondary | ICD-10-CM | POA: Diagnosis not present

## 2020-09-01 DIAGNOSIS — I10 Essential (primary) hypertension: Secondary | ICD-10-CM

## 2020-09-01 MED ORDER — SUCRALFATE 1 G PO TABS: 1.0000 g | ORAL_TABLET | Freq: Three times a day (TID) | ORAL | 0 refills | Status: DC

## 2020-09-01 MED ORDER — OMEPRAZOLE 20 MG PO CPDR: 20.0000 mg | DELAYED_RELEASE_CAPSULE | Freq: Every day | ORAL | 3 refills | Status: DC

## 2020-09-01 NOTE — Assessment & Plan Note (Addendum)
Acute with symptoms since yesterday.  CBC in office noting some mild anemia compared to normal for patient with HGB 10.9, HCT 32.8, MCV 97 with mild elevation in WBC and neutrophils.  Concern for either kidney stones or GI bleed being present based on current symptoms -- risk for bleed due to extensive surgeries in past.  He missed colonoscopy in 2020 and has risk factors present for colon CA -- at this time obtain CMP, TSH, iron, ferritin.  Continue current Tamsulosin.  Start Carafate and Prilosec -- STOP ASA.  Urgent referral to GI placed and urgent imaging CT Abd/pelvis ordered.  Discussed at length with patient.  Recommend he hold Amlodipine at this time due to lower BP reading in office and ensure increased fluid intake -- may use Pedialyte.  Return in one week, sooner if worsening symptoms.  Immediately go to ER if increased syncope or weakness or worsening symptoms -- at this time he refuses ER, however had advised this.

## 2020-09-01 NOTE — Patient Instructions (Addendum)
HOLD AMLODIPINE AT THIS TIME DUE TO LOWER BP -- STOP ASA INCREASE FLUID INTAKE AM PUTTING IN URGENT IMAGING FOR TOMORROW AND A GI (BELLY DOCTOR) REFERRAL DUE TO CONCERN FOR BLEED  -  START TAKING CARAFATE THREE TIMES A DAY AND PRILOSEC DAILY   [employer] medicine (25th ed., pp. [phone]). [place], [place]: Elsevier.">  Anemia  Anemia is a condition in which there is not enough red blood cells or hemoglobin in the blood. Hemoglobin is a substance in red blood cells that carries oxygen. When you do not have enough red blood cells or hemoglobin (are anemic), your body cannot get enough oxygen and your organs may not work properly. As a result, you may feel very tired or have other problems. What are the causes? Common causes of anemia include:  Excessive bleeding. Anemia can be caused by excessive bleeding inside or outside the body, including bleeding from the intestines or from heavy menstrual periods in females.  Poor nutrition.  Long-lasting (chronic) kidney, thyroid, and liver disease.  Bone marrow disorders, spleen problems, and blood disorders.  Cancer and treatments for cancer.  HIV (human immunodeficiency virus) and AIDS (acquired immunodeficiency syndrome).  Infections, medicines, and autoimmune disorders that destroy red blood cells. What are the signs or symptoms? Symptoms of this condition include:  Minor weakness.  Dizziness.  Headache, or difficulties concentrating and sleeping.  Heartbeats that feel irregular or faster than normal (palpitations).  Shortness of breath, especially with exercise.  Pale skin, lips, and nails, or cold hands and feet.  Indigestion and nausea. Symptoms may occur suddenly or develop slowly. If your anemia is mild, you may not have symptoms. How is this diagnosed? This condition is diagnosed based on blood tests, your medical history, and a physical exam. In some cases, a test may be needed in which cells are removed from the  soft tissue inside of a bone and looked at under a microscope (bone marrow biopsy). Your health care provider may also check your stool (feces) for blood and may do additional testing to look for the cause of your bleeding. Other tests may include:  Imaging tests, such as a CT scan or MRI.  A procedure to see inside your esophagus and stomach (endoscopy).  A procedure to see inside your colon and rectum (colonoscopy). How is this treated? Treatment for this condition depends on the cause. If you continue to lose a lot of blood, you may need to be treated at a hospital. Treatment may include:  Taking supplements of iron, vitamin G81, or folic acid.  Taking a hormone medicine (erythropoietin) that can help to stimulate red blood cell growth.  Having a blood transfusion. This may be needed if you lose a lot of blood.  Making changes to your diet.  Having surgery to remove your spleen. Follow these instructions at home:  Take over-the-counter and prescription medicines only as told by your health care provider.  Take supplements only as told by your health care provider.  Follow any diet instructions that you were given by your health care provider.  Keep all follow-up visits as told by your health care provider. This is important. Contact a health care provider if:  You develop new bleeding anywhere in the body. Get help right away if:  You are very weak.  You are short of breath.  You have pain in your abdomen or chest.  You are dizzy or feel faint.  You have trouble concentrating.  You have bloody stools, black stools, or tarry  stools.  You vomit repeatedly or you vomit up blood. These symptoms may represent a serious problem that is an emergency. Do not wait to see if the symptoms will go away. Get medical help right away. Call your local emergency services (911 in the [place] Do not drive yourself to the hospital. Summary  Anemia is a condition in which you do not  have enough red blood cells or enough of a substance in your red blood cells that carries oxygen (hemoglobin).  Symptoms may occur suddenly or develop slowly.  If your anemia is mild, you may not have symptoms.  This condition is diagnosed with blood tests, a medical history, and a physical exam. Other tests may be needed.  Treatment for this condition depends on the cause of the anemia. This information is not intended to replace advice given to you by your health care provider. Make sure you discuss any questions you have with your health care provider. Document Revised: 07/08/2019 Document Reviewed: 07/08/2019 Elsevier Patient Education  2021 [employer].

## 2020-09-01 NOTE — Assessment & Plan Note (Addendum)
Acute with symptoms since yesterday.  CBC in office noting some mild anemia compared to normal with HGB 10.9, HCT 32.8, MCV 97.  Concern for either kidney stones or GI bleed being present based on current symptoms.  He missed colonoscopy in 2020 and has risk factors present for colon CA -- at this time obtain CMP, TSH, iron, ferritin.  Continue current Tamsulosin.  Start Carafate and Prilosec.  Urgent referral to GI placed and urgent imaging CT Abd/pelvis ordered.  Discussed at length with patient.  Recommend he hold Amlodipine at this time due to lower BP reading in office and ensure increased fluid intake -- may use Pedialyte.  Return in one week, sooner if worsening symptoms.  Immediately go to ER if increased syncope or weakness or worsening symptoms -- at this time he refuses ER, however had advised this.

## 2020-09-01 NOTE — Assessment & Plan Note (Signed)
Acute with symptoms since yesterday, in past has presented similarly with kidney stones.  UA today noting 2+ BLD and trace LEUK, no bacteria.  CBC in office noting some mild anemia compared to normal with HGB 10.9, HCT 32.8, MCV 97.  Concern for either kidney stones or GI bleed being present based on current symptoms.  He missed colonoscopy in 2020 and has risk factors present for colon CA -- at this time obtain CMP, TSH, iron, ferritin.  Continue current Tamsulosin.  Start Carafate and Prilosec.  Urgent referral to GI placed and urgent imaging CT Abd/pelvis ordered.  Discussed at length with patient.  Recommend he hold Amlodipine at this time due to lower BP reading in office and ensure increased fluid intake -- may use Pedialyte.  Return in one week, sooner if worsening symptoms. Immediately go to ER if increased syncope or weakness or worsening symptoms -- at this time he refuses ER, however had advised this.

## 2020-09-01 NOTE — Assessment & Plan Note (Signed)
I have recommended complete cessation of tobacco use. I have discussed various options available for assistance with tobacco cessation including over the counter methods (Nicotine gum, patch and lozenges). We also discussed prescription options (Chantix, Nicotine Inhaler / Nasal Spray). The patient is not interested in pursuing any prescription tobacco cessation options at this time.  Continue annual lung screening. 

## 2020-09-01 NOTE — Progress Notes (Signed)
BP 94/69   Pulse 89   Temp 98.9 F (37.2 C)   Wt 152 lb (68.9 kg)   SpO2 100%   BMI 22.45 kg/m    Subjective:    Patient ID: Mark [name], male    DOB: 11/27/1960, 60 y.o.   MRN: [id number]  HPI: Mark [name] is a 60 y.o. male  Chief Complaint  Patient presents with  . Loss of Consciousness    Pt states he passed out last night, was walking to the kitchen and fell out. States he wasn't out for long but does know he passed out. He did not injure himself. Patient states he feels bad, started yesterday    FATIGUE Started with symptoms yesterday, he can not describe what feels bad, but reports he "just feels bad".   Passed out last night while walking in kitchen, does not know for how long and denies injury with this.  No SOB, CP, palpitations, fever, chills, headaches, URI or UTI symptoms reported.  Similar symptoms presented in September 2020 with vague fatigue and at time had kidney stones that required surgery in October 2020.  Does take Amlodipine and Metoprolol for HTN.  Feels drained.  Has not been Covid vaccinated.  Did have diarrhea last night which he reports was black water -- lots of it.   Takes ASA daily due to past CVA.  He has history of drug abuse, last hospitalization for acute hepatitis in 2017. At that time hepatitis labs returned negative. He denies any drug, alcohol, or Tylenol use. In past he used cocaine and Percocet.  Also has history of overdose, which at this time he denies use of. He denies abdominal pain, constipation, muscle pain, or distension.  Reports decreased appetite, has not ate in 2 days -- is drinking sweet tea. States he "just doesn't want to eat", denies N&V with eating.  Denies any depressed mood.  States he is passing bowels once a day without difficulty, no straining.  Is drinking fluids, drinking sweet tea, having one glass every couple hours. Does have history of having "part of my bowels removed" about 15 years ago before his stroke.   Is a smoker since age 60, 1 PPD, not interested in quitting.  Last lung CT on 05/11/20 noting emphysema and aortic atherosclerosis.  Of note on review records:  --- history of short gut syndrome and right hemicolectomy (multiple surgeries) -- was scheduled for colonoscopy in 2020 and did not attend  Duration:weeks, several weeks ago Severity:moderate Onset:sudden Context when symptoms started:none Symptoms improve with rest:no Depressive symptoms:no Stress/anxiety:no Insomnia:none Snoring:no Observed apnea by bed partner:no Daytime hypersomnolence:no Wakes feeling refreshed:no History of sleep study:no Dysnea on exertion:no Orthopnea/PND:no Chest pain:no Chronic cough:no Lower extremity edema:no Arthralgias:no Myalgias:no Weakness:yes Rash:no  Relevant past medical, surgical, family and social history reviewed and updated as indicated. Interim medical history since our last visit reviewed. Allergies and medications reviewed and updated.  Review of Systems  Constitutional: Positive for appetite change and fatigue. Negative for activity change, chills and fever.  HENT: Negative.   Respiratory: Negative for cough, chest tightness, shortness of breath and wheezing.   Cardiovascular: Negative for chest pain, palpitations and leg swelling.  Gastrointestinal: Positive for blood in stool (possibly, reports black stool last night), diarrhea and nausea. Negative for abdominal distention, abdominal pain, constipation and vomiting.  Endocrine: Negative.   Genitourinary: Negative.   Musculoskeletal: Negative.   Neurological: Negative.   Psychiatric/Behavioral: Negative.     Per HPI unless specifically indicated  above     Objective:    BP 94/69   Pulse 89   Temp 98.9 F (37.2 C)   Wt 152 lb (68.9 kg)   SpO2 100%   BMI 22.45 kg/m   Wt Readings from Last 3 Encounters:  09/02/20 151 lb 14.4 oz (68.9 kg)  09/01/20 152 lb (68.9 kg)  06/23/20 158 lb  (71.7 kg)    Physical Exam Vitals and nursing note reviewed.  Constitutional:      General: He is awake. He is not in acute distress.    Appearance: He is well-developed and well-groomed. He is ill-appearing. He is not toxic-appearing.  HENT:     Head: Normocephalic and atraumatic.     Right Ear: Hearing normal. No drainage.     Left Ear: Hearing normal. No drainage.  Eyes:     General: Lids are normal.        Right eye: No discharge.        Left eye: No discharge.     Conjunctiva/sclera: Conjunctivae normal.     Pupils: Pupils are equal, round, and reactive to light.  Neck:     Thyroid: No thyromegaly.     Vascular: No carotid bruit.  Cardiovascular:     Rate and Rhythm: Normal rate and regular rhythm.     Heart sounds: Normal heart sounds, S1 normal and S2 normal. No murmur heard. No gallop.   Pulmonary:     Effort: Pulmonary effort is normal. No accessory muscle usage or respiratory distress.     Breath sounds: Normal breath sounds.  Abdominal:     General: Bowel sounds are normal.     Palpations: Abdomen is soft. There is no hepatomegaly or splenomegaly.     Tenderness: There is no abdominal tenderness.     Comments: Scarring present along abdomen and small outpouching to right lower abdominal scar similar to previous exams.  Genitourinary:    Comments: Rectal exam deferred per request of patient. Musculoskeletal:        General: Normal range of motion.     Cervical back: Normal range of motion and neck supple.     Right lower leg: No edema.     Left lower leg: No edema.  Skin:    General: Skin is warm and dry.     Capillary Refill: Capillary refill takes less than 2 seconds.     Findings: No rash.  Neurological:     Mental Status: He is alert and oriented to person, place, and time.     Cranial Nerves: Cranial nerves are intact.     Motor: Motor function is intact.     Coordination: Coordination is intact.     Gait: Gait is intact.     Deep Tendon Reflexes:  Reflexes are normal and symmetric.     Reflex Scores:      Brachioradialis reflexes are 2+ on the right side and 2+ on the left side.      Patellar reflexes are 2+ on the right side and 2+ on the left side. Psychiatric:        Mood and Affect: Mood normal.        Behavior: Behavior normal. Behavior is cooperative.        Thought Content: Thought content normal.        Judgment: Judgment normal.     Results for orders placed or performed in visit on 09/01/20  Microscopic Examination   Urine  Result Value Ref Range   WBC,  UA 6-10 (A) 0 - 5 /hpf   RBC 11-30 (A) 0 - 2 /hpf   Epithelial Cells (non renal) 0-10 0 - 10 /hpf   Bacteria, UA None seen None seen/Few  Urinalysis, Routine w reflex microscopic  Result Value Ref Range   Specific Gravity, UA 1.015 1.005 - 1.030   pH, UA 6.0 5.0 - 7.5   Color, UA Yellow Yellow   Appearance Ur Clear Clear   Leukocytes,UA Trace (A) Negative   Protein,UA Negative Negative/Trace   Glucose, UA Negative Negative   Ketones, UA Negative Negative   RBC, UA 2+ (A) Negative   Bilirubin, UA Negative Negative   Urobilinogen, Ur 0.2 0.2 - 1.0 mg/dL   Nitrite, UA Negative Negative   Microscopic Examination See below:   CBC With Differential/Platelet  Result Value Ref Range   WBC 10.3 3.4 - 10.8 x10E3/uL   RBC 3.38 (L) 4.14 - 5.80 x10E6/uL   Hemoglobin 10.9 (L) 13.0 - 17.7 g/dL   Hematocrit 07.3 (L) 71.0 - 51.0 %   MCV 97 79 - 97 fL   MCH 32.2 26.6 - 33.0 pg   MCHC 33.2 31.5 - 35.7 g/dL   RDW 62.6 94.8 - 54.6 %   Platelets 340 150 - 450 x10E3/uL   Neutrophils 69 Not Estab. %   Lymphs 24 Not Estab. %   MID 7 Not Estab. %   Neutrophils Absolute 7.1 (H) 1.4 - 7.0 x10E3/uL   Lymphocytes Absolute 2.5 0.7 - 3.1 x10E3/uL   MID (Absolute) 0.7 0.1 - 1.6 X10E3/uL  Comprehensive metabolic panel  Result Value Ref Range   Glucose 100 (H) 65 - 99 mg/dL   BUN 37 (H) 8 - 27 mg/dL   Creatinine, Ser 2.70 (H) 0.76 - 1.27 mg/dL   GFR calc non Af Amer 54 (L) >59  mL/min/1.73   GFR calc Af Amer 63 >59 mL/min/1.73   BUN/Creatinine Ratio 26 (H) 10 - 24   Sodium 136 134 - 144 mmol/L   Potassium 5.1 3.5 - 5.2 mmol/L   Chloride 103 96 - 106 mmol/L   CO2 20 20 - 29 mmol/L   Calcium 9.4 8.6 - 10.2 mg/dL   Total Protein 6.7 6.0 - 8.5 g/dL   Albumin 4.3 3.8 - 4.9 g/dL   Globulin, Total 2.4 1.5 - 4.5 g/dL   Albumin/Globulin Ratio 1.8 1.2 - 2.2   Bilirubin Total 0.2 0.0 - 1.2 mg/dL   Alkaline Phosphatase 66 44 - 121 IU/L   AST 13 0 - 40 IU/L   ALT 15 0 - 44 IU/L  TSH  Result Value Ref Range   TSH 3.950 0.450 - 4.500 uIU/mL  Iron, TIBC and Ferritin Panel  Result Value Ref Range   Total Iron Binding Capacity 297 250 - 450 ug/dL   UIBC 350 093 - 818 ug/dL   Iron 78 38 - 299 ug/dL   Iron Saturation 26 15 - 55 %   Ferritin 79 30 - 400 ng/mL      Assessment & Plan:   Problem List Items Addressed This Visit      Nervous and Auditory   Nicotine dependence, cigarettes, w unsp disorders    I have recommended complete cessation of tobacco use. I have discussed various options available for assistance with tobacco cessation including over the counter methods (Nicotine gum, patch and lozenges). We also discussed prescription options (Chantix, Nicotine Inhaler / Nasal Spray). The patient is not interested in pursuing any prescription tobacco cessation options at this  time.  Continue annual lung screening.         Other   Fatigue - Primary    Acute with symptoms since yesterday, in past has presented similarly with kidney stones.  UA today noting 2+ BLD and trace LEUK, no bacteria.  CBC in office noting some mild anemia compared to normal with HGB 10.9, HCT 32.8, MCV 97.  Concern for either kidney stones or GI bleed being present based on current symptoms.  He missed colonoscopy in 2020 and has risk factors present for colon CA -- at this time obtain CMP, TSH, iron, ferritin.  Continue current Tamsulosin.  Start Carafate and Prilosec.  Urgent referral to GI  placed and urgent imaging CT Abd/pelvis ordered.  Discussed at length with patient.  Recommend he hold Amlodipine at this time due to lower BP reading in office and ensure increased fluid intake -- may use Pedialyte.  Return in one week, sooner if worsening symptoms. Immediately go to ER if increased syncope or weakness or worsening symptoms -- at this time he refuses ER, however had advised this.      Relevant Orders   Urinalysis, Routine w reflex microscopic (Completed)   CBC With Differential/Platelet (Completed)   Comprehensive metabolic panel (Completed)   TSH (Completed)   Iron, TIBC and Ferritin Panel (Completed)   Fecal occult blood, imunochemical   Novel Coronavirus, NAA ([facility])   Ambulatory referral to Gastroenterology   Diarrhea    Acute with symptoms since yesterday.  CBC in office noting some mild anemia compared to normal with HGB 10.9, HCT 32.8, MCV 97.  Concern for either kidney stones or GI bleed being present based on current symptoms.  He missed colonoscopy in 2020 and has risk factors present for colon CA -- at this time obtain CMP, TSH, iron, ferritin.  Continue current Tamsulosin.  Start Carafate and Prilosec.  Urgent referral to GI placed and urgent imaging CT Abd/pelvis ordered.  Discussed at length with patient.  Recommend he hold Amlodipine at this time due to lower BP reading in office and ensure increased fluid intake -- may use Pedialyte.  Return in one week, sooner if worsening symptoms.  Immediately go to ER if increased syncope or weakness or worsening symptoms -- at this time he refuses ER, however had advised this.      Relevant Orders   Ambulatory referral to Gastroenterology   CT Abdomen Pelvis Wo Contrast   Dark stools    Acute with symptoms since yesterday.  CBC in office noting some mild anemia compared to normal for patient with HGB 10.9, HCT 32.8, MCV 97 with mild elevation in WBC and neutrophils.  Concern for either kidney stones or GI bleed being  present based on current symptoms -- risk for bleed due to extensive surgeries in past.  He missed colonoscopy in 2020 and has risk factors present for colon CA -- at this time obtain CMP, TSH, iron, ferritin.  Continue current Tamsulosin.  Start Carafate and Prilosec -- STOP ASA.  Urgent referral to GI placed and urgent imaging CT Abd/pelvis ordered.  Discussed at length with patient.  Recommend he hold Amlodipine at this time due to lower BP reading in office and ensure increased fluid intake -- may use Pedialyte.  Return in one week, sooner if worsening symptoms.  Immediately go to ER if increased syncope or weakness or worsening symptoms -- at this time he refuses ER, however had advised this.          Follow  up plan: Return in about 1 week (around 09/08/2020) for FATIGUE.

## 2020-09-02 ENCOUNTER — Ambulatory Visit: Payer: Medicare HMO

## 2020-09-02 ENCOUNTER — Inpatient Hospital Stay: Payer: Medicare HMO

## 2020-09-02 ENCOUNTER — Observation Stay (HOSPITAL_BASED_OUTPATIENT_CLINIC_OR_DEPARTMENT_OTHER)
Admission: EM | Admit: 2020-09-02 | Discharge: 2020-09-03 | Disposition: A | Discharge status: Home / Self Care | Payer: Medicare HMO | Source: Home / Self Care | Attending: Emergency Medicine | Admitting: Emergency Medicine

## 2020-09-02 ENCOUNTER — Telehealth: Payer: Self-pay

## 2020-09-02 ENCOUNTER — Encounter: Payer: Self-pay | Admitting: Emergency Medicine

## 2020-09-02 DIAGNOSIS — F419 Anxiety disorder, unspecified: Secondary | ICD-10-CM | POA: Diagnosis present

## 2020-09-02 DIAGNOSIS — J449 Chronic obstructive pulmonary disease, unspecified: Secondary | ICD-10-CM | POA: Diagnosis not present

## 2020-09-02 DIAGNOSIS — R000 Tachycardia, unspecified: Secondary | ICD-10-CM | POA: Diagnosis not present

## 2020-09-02 DIAGNOSIS — J438 Other emphysema: Secondary | ICD-10-CM | POA: Diagnosis not present

## 2020-09-02 DIAGNOSIS — I10 Essential (primary) hypertension: Secondary | ICD-10-CM | POA: Diagnosis present

## 2020-09-02 DIAGNOSIS — F17219 Nicotine dependence, cigarettes, with unspecified nicotine-induced disorders: Secondary | ICD-10-CM | POA: Diagnosis present

## 2020-09-02 DIAGNOSIS — Z20822 Contact with and (suspected) exposure to covid-19: Secondary | ICD-10-CM | POA: Insufficient documentation

## 2020-09-02 DIAGNOSIS — Z980 Intestinal bypass and anastomosis status: Secondary | ICD-10-CM | POA: Diagnosis not present

## 2020-09-02 DIAGNOSIS — D62 Acute posthemorrhagic anemia: Secondary | ICD-10-CM | POA: Diagnosis present

## 2020-09-02 DIAGNOSIS — Z7982 Long term (current) use of aspirin: Secondary | ICD-10-CM | POA: Diagnosis not present

## 2020-09-02 DIAGNOSIS — Z79899 Other long term (current) drug therapy: Secondary | ICD-10-CM | POA: Diagnosis not present

## 2020-09-02 DIAGNOSIS — K921 Melena: Secondary | ICD-10-CM | POA: Diagnosis not present

## 2020-09-02 DIAGNOSIS — K625 Hemorrhage of anus and rectum: Secondary | ICD-10-CM | POA: Diagnosis present

## 2020-09-02 DIAGNOSIS — T39395A Adverse effect of other nonsteroidal anti-inflammatory drugs [NSAID], initial encounter: Secondary | ICD-10-CM | POA: Diagnosis present

## 2020-09-02 DIAGNOSIS — D649 Anemia, unspecified: Secondary | ICD-10-CM | POA: Diagnosis not present

## 2020-09-02 DIAGNOSIS — K3182 Dieulafoy lesion (hemorrhagic) of stomach and duodenum: Secondary | ICD-10-CM | POA: Diagnosis present

## 2020-09-02 DIAGNOSIS — Z8249 Family history of ischemic heart disease and other diseases of the circulatory system: Secondary | ICD-10-CM | POA: Diagnosis not present

## 2020-09-02 DIAGNOSIS — F17210 Nicotine dependence, cigarettes, uncomplicated: Secondary | ICD-10-CM | POA: Insufficient documentation

## 2020-09-02 DIAGNOSIS — Z8711 Personal history of peptic ulcer disease: Secondary | ICD-10-CM | POA: Diagnosis not present

## 2020-09-02 DIAGNOSIS — K259 Gastric ulcer, unspecified as acute or chronic, without hemorrhage or perforation: Secondary | ICD-10-CM | POA: Diagnosis not present

## 2020-09-02 DIAGNOSIS — E785 Hyperlipidemia, unspecified: Secondary | ICD-10-CM | POA: Diagnosis not present

## 2020-09-02 DIAGNOSIS — K254 Chronic or unspecified gastric ulcer with hemorrhage: Secondary | ICD-10-CM | POA: Diagnosis present

## 2020-09-02 DIAGNOSIS — D500 Iron deficiency anemia secondary to blood loss (chronic): Secondary | ICD-10-CM | POA: Diagnosis not present

## 2020-09-02 DIAGNOSIS — K635 Polyp of colon: Secondary | ICD-10-CM | POA: Diagnosis present

## 2020-09-02 DIAGNOSIS — Z8673 Personal history of transient ischemic attack (TIA), and cerebral infarction without residual deficits: Secondary | ICD-10-CM | POA: Diagnosis not present

## 2020-09-02 DIAGNOSIS — K2971 Gastritis, unspecified, with bleeding: Secondary | ICD-10-CM | POA: Diagnosis present

## 2020-09-02 DIAGNOSIS — K920 Hematemesis: Secondary | ICD-10-CM

## 2020-09-02 DIAGNOSIS — R109 Unspecified abdominal pain: Secondary | ICD-10-CM | POA: Diagnosis not present

## 2020-09-02 DIAGNOSIS — F32A Depression, unspecified: Secondary | ICD-10-CM | POA: Diagnosis present

## 2020-09-02 DIAGNOSIS — R1111 Vomiting without nausea: Secondary | ICD-10-CM | POA: Diagnosis not present

## 2020-09-02 DIAGNOSIS — Z87442 Personal history of urinary calculi: Secondary | ICD-10-CM | POA: Diagnosis not present

## 2020-09-02 DIAGNOSIS — R58 Hemorrhage, not elsewhere classified: Secondary | ICD-10-CM | POA: Diagnosis not present

## 2020-09-02 DIAGNOSIS — F418 Other specified anxiety disorders: Secondary | ICD-10-CM | POA: Diagnosis not present

## 2020-09-02 DIAGNOSIS — K449 Diaphragmatic hernia without obstruction or gangrene: Secondary | ICD-10-CM | POA: Diagnosis present

## 2020-09-02 DIAGNOSIS — Z885 Allergy status to narcotic agent status: Secondary | ICD-10-CM | POA: Diagnosis not present

## 2020-09-02 DIAGNOSIS — K922 Gastrointestinal hemorrhage, unspecified: Secondary | ICD-10-CM | POA: Diagnosis not present

## 2020-09-02 DIAGNOSIS — M1990 Unspecified osteoarthritis, unspecified site: Secondary | ICD-10-CM | POA: Diagnosis not present

## 2020-09-02 DIAGNOSIS — K2980 Duodenitis without bleeding: Secondary | ICD-10-CM | POA: Diagnosis present

## 2020-09-02 HISTORY — DX: Personal history of urinary calculi: Z87.442

## 2020-09-02 LAB — LIPASE, BLOOD: Lipase: 21 U/L (ref 11–51)

## 2020-09-02 LAB — COMPREHENSIVE METABOLIC PANEL
ALT: 15 IU/L (ref 0–44)
ALT: 15 U/L (ref 0–44)
AST: 13 IU/L (ref 0–40)
AST: 15 U/L (ref 15–41)
Albumin/Globulin Ratio: 1.8 (ref 1.2–2.2)
Albumin: 4.3 g/dL (ref 3.8–4.9)
Albumin: 3.5 g/dL (ref 3.5–5.0)
Alkaline Phosphatase: 66 IU/L (ref 44–121)
Alkaline Phosphatase: 47 U/L (ref 38–126)
Anion gap: 9 (ref 5–15)
BUN/Creatinine Ratio: 26 — ABNORMAL HIGH (ref 10–24)
BUN: 37 mg/dL — ABNORMAL HIGH (ref 8–27)
BUN: 38 mg/dL — ABNORMAL HIGH (ref 6–20)
Bilirubin Total: 0.2 mg/dL (ref 0.0–1.2)
CO2: 20 mmol/L (ref 20–29)
CO2: 24 mmol/L (ref 22–32)
Calcium: 9.4 mg/dL (ref 8.6–10.2)
Calcium: 8.6 mg/dL — ABNORMAL LOW (ref 8.9–10.3)
Chloride: 103 mmol/L (ref 96–106)
Chloride: 107 mmol/L (ref 98–111)
Creatinine, Ser: 1.4 mg/dL — ABNORMAL HIGH (ref 0.76–1.27)
Creatinine, Ser: 1.17 mg/dL (ref 0.61–1.24)
GFR calc Af Amer: 63 mL/min/{1.73_m2} (ref >59)
GFR calc non Af Amer: 54 mL/min/{1.73_m2} — ABNORMAL LOW (ref >59)
GFR, Estimated: >60 mL/min (ref >60)
Globulin, Total: 2.4 g/dL (ref 1.5–4.5)
Glucose, Bld: 128 mg/dL — ABNORMAL HIGH (ref 70–99)
Glucose: 100 mg/dL — ABNORMAL HIGH (ref 65–99)
Potassium: 5.1 mmol/L (ref 3.5–5.2)
Potassium: 4 mmol/L (ref 3.5–5.1)
Sodium: 136 mmol/L (ref 134–144)
Sodium: 140 mmol/L (ref 135–145)
Total Bilirubin: 0.7 mg/dL (ref 0.3–1.2)
Total Protein: 6.7 g/dL (ref 6.0–8.5)
Total Protein: 6.3 g/dL — ABNORMAL LOW (ref 6.5–8.1)

## 2020-09-02 LAB — URINALYSIS, ROUTINE W REFLEX MICROSCOPIC
Bilirubin, UA: NEGATIVE
Glucose, UA: NEGATIVE
Ketones, UA: NEGATIVE
Nitrite, UA: NEGATIVE
Protein,UA: NEGATIVE
Specific Gravity, UA: 1.015 (ref 1.005–1.030)
Urobilinogen, Ur: 0.2 mg/dL (ref 0.2–1.0)
pH, UA: 6 (ref 5.0–7.5)

## 2020-09-02 LAB — SARS CORONAVIRUS 2 BY RT PCR (HOSPITAL ORDER, PERFORMED IN ~~LOC~~ HOSPITAL LAB): SARS Coronavirus 2: NEGATIVE

## 2020-09-02 LAB — HEMOGLOBIN AND HEMATOCRIT, BLOOD
HCT: 25.4 % — ABNORMAL LOW (ref 39.0–52.0)
Hemoglobin: 8.5 g/dL — ABNORMAL LOW (ref 13.0–17.0)

## 2020-09-02 LAB — CBC WITH DIFFERENTIAL/PLATELET
Hematocrit: 32.8 % — ABNORMAL LOW (ref 37.5–51.0)
Hemoglobin: 10.9 g/dL — ABNORMAL LOW (ref 13.0–17.7)
Lymphocytes Absolute: 2.5 10*3/uL (ref 0.7–3.1)
Lymphs: 24 %
MCH: 32.2 pg (ref 26.6–33.0)
MCHC: 33.2 g/dL (ref 31.5–35.7)
MCV: 97 fL (ref 79–97)
MID (Absolute): 0.7 10*3/uL (ref 0.1–1.6)
MID: 7 %
Neutrophils Absolute: 7.1 10*3/uL — ABNORMAL HIGH (ref 1.4–7.0)
Neutrophils: 69 %
Platelets: 340 10*3/uL (ref 150–450)
RBC: 3.38 x10E6/uL — ABNORMAL LOW (ref 4.14–5.80)
RDW: 14.4 % (ref 11.6–15.4)
WBC: 10.3 10*3/uL (ref 3.4–10.8)

## 2020-09-02 LAB — CBC
HCT: 27.2 % — ABNORMAL LOW (ref 39.0–52.0)
Hemoglobin: 8.9 g/dL — ABNORMAL LOW (ref 13.0–17.0)
MCH: 32 pg (ref 26.0–34.0)
MCHC: 32.7 g/dL (ref 30.0–36.0)
MCV: 97.8 fL (ref 80.0–100.0)
Platelets: 311 10*3/uL (ref 150–400)
RBC: 2.78 MIL/uL — ABNORMAL LOW (ref 4.22–5.81)
RDW: 13.7 % (ref 11.5–15.5)
WBC: 8.3 10*3/uL (ref 4.0–10.5)
nRBC: 0 % (ref 0.0–0.2)

## 2020-09-02 LAB — PREPARE RBC (CROSSMATCH)

## 2020-09-02 LAB — MICROSCOPIC EXAMINATION: Bacteria, UA: NONE SEEN

## 2020-09-02 LAB — ABO/RH: ABO/RH(D): A NEG

## 2020-09-02 LAB — IRON,TIBC AND FERRITIN PANEL
Ferritin: 79 ng/mL (ref 30–400)
Iron Saturation: 26 % (ref 15–55)
Iron: 78 ug/dL (ref 38–169)
Total Iron Binding Capacity: 297 ug/dL (ref 250–450)
UIBC: 219 ug/dL (ref 111–343)

## 2020-09-02 LAB — FOLATE: Folate: 8.1 ng/mL (ref >5.9)

## 2020-09-02 LAB — SAMPLE TO BLOOD BANK

## 2020-09-02 LAB — TSH: TSH: 3.95 u[IU]/mL (ref 0.450–4.500)

## 2020-09-02 MED ORDER — ONDANSETRON HCL 4 MG/2ML IJ SOLN
4.0000 mg | Freq: Once | INTRAMUSCULAR | Status: AC
  Administered 2020-09-02: 4 mg via INTRAVENOUS

## 2020-09-02 MED ORDER — SODIUM CHLORIDE 0.9 % IV BOLUS
1000.0000 mL | Freq: Once | INTRAVENOUS | Status: AC
  Administered 2020-09-02: 1000 mL via INTRAVENOUS

## 2020-09-02 MED ORDER — ONDANSETRON HCL 4 MG/2ML IJ SOLN
4.0000 mg | Freq: Four times a day (QID) | INTRAMUSCULAR | Status: DC | PRN
  Administered 2020-09-03: 4 mg via INTRAVENOUS
  Filled 2020-09-02: qty 2

## 2020-09-02 MED ORDER — FENTANYL CITRATE (PF) 100 MCG/2ML IJ SOLN
25.0000 ug | INTRAMUSCULAR | Status: DC | PRN
  Administered 2020-09-02 – 2020-09-03 (×3): 25 ug via INTRAVENOUS
  Administered 2020-09-03: 50 ug via INTRAVENOUS
  Administered 2020-09-03: 25 ug via INTRAVENOUS
  Administered 2020-09-03: 50 ug via INTRAVENOUS
  Filled 2020-09-02 (×5): qty 2

## 2020-09-02 MED ORDER — ONDANSETRON HCL 4 MG/2ML IJ SOLN
4.0000 mg | Freq: Once | INTRAMUSCULAR | Status: AC
  Filled 2020-09-02: qty 2

## 2020-09-02 MED ORDER — IOHEXOL 300 MG/ML  SOLN
100.0000 mL | Freq: Once | INTRAMUSCULAR | Status: AC | PRN
  Administered 2020-09-02: 100 mL via INTRAVENOUS
  Filled 2020-09-02: qty 100

## 2020-09-02 MED ORDER — SODIUM CHLORIDE 0.9 % IV SOLN
10.0000 mL/h | Freq: Once | INTRAVENOUS | Status: AC
  Administered 2020-09-02: 10 mL/h via INTRAVENOUS

## 2020-09-02 MED ORDER — ONDANSETRON HCL 4 MG PO TABS: 4.0000 mg | ORAL_TABLET | Freq: Four times a day (QID) | ORAL | Status: DC | PRN

## 2020-09-02 MED ORDER — FENTANYL CITRATE (PF) 100 MCG/2ML IJ SOLN
50.0000 ug | Freq: Once | INTRAMUSCULAR | Status: AC
Start: 2020-09-02 — End: 2020-09-02
  Administered 2020-09-02: 50 ug via INTRAVENOUS
  Filled 2020-09-02: qty 2

## 2020-09-02 MED ORDER — NICOTINE 21 MG/24HR TD PT24: 21.0000 mg | MEDICATED_PATCH | Freq: Every day | TRANSDERMAL | Status: DC

## 2020-09-02 MED ORDER — IOHEXOL 9 MG/ML PO SOLN
1000.0000 mL | ORAL | Status: AC
  Administered 2020-09-02: 500 mL via ORAL
  Filled 2020-09-02 (×2): qty 1000

## 2020-09-02 MED ORDER — ONDANSETRON HCL 4 MG/2ML IJ SOLN
INTRAMUSCULAR | Status: AC
  Administered 2020-09-02: 4 mg via INTRAVENOUS
  Filled 2020-09-02: qty 2

## 2020-09-02 MED ORDER — SODIUM CHLORIDE 0.9 % IV SOLN
8.0000 mg/h | INTRAVENOUS | Status: DC
  Administered 2020-09-02 – 2020-09-03 (×2): 8 mg/h via INTRAVENOUS
  Filled 2020-09-02 (×2): qty 80

## 2020-09-02 MED ORDER — SODIUM CHLORIDE 0.9 % IV SOLN
80.0000 mg | Freq: Once | INTRAVENOUS | Status: AC
  Administered 2020-09-02: 80 mg via INTRAVENOUS
  Filled 2020-09-02: qty 80

## 2020-09-02 MED ORDER — SODIUM CHLORIDE 0.9 % IV SOLN: INTRAVENOUS | Status: DC

## 2020-09-02 NOTE — ED Triage Notes (Signed)
C/O blood in stool x 2 days.  Today also vomiting blood.  Per EMS report, P:  120.  18g saline lock and IV fluids started.

## 2020-09-02 NOTE — Consult Note (Addendum)
Cephas Darby, MD [address]  [address]  [place], [place] [address]  Main: [phone]  Fax: [phone] Pager: [phone]   Consultation  Referring Provider:     No ref. provider found Primary Care Physician:  Venita Lick, NP Primary Gastroenterologist: Althia Forts        Reason for Consultation:     Hematemesis and melena  Date of Admission:  09/02/2020 Date of Consultation:  09/02/2020         HPI:   Mark [name] is a 60 y.o. male with history of hypertension, stroke, BPH presented to ER yesterday with abdominal pain, worsening of weakness, black tarry stools as well as hematemesis.  Patient was tachycardic in the ER.  He was started on IV fluids.  He was originally seen by his PCP yesterday secondary to the symptoms.  Labs reveal hemoglobin of 10.9 with leukocytosis.  Is also complaining of diffuse abdominal pain.  Patient acknowledges taking aspirin 300 mg daily for a long time.  Labs revealed elevated BUN/creatinine 37/1.4, normal LFTs, hemoglobin 10.9 yesterday, dropped to 8.9 today.  He is receiving 1 unit of PRBCs and undergoing CT abdomen and pelvis  Patient had a history of cholecystectomy and appendectomy, lithotripsy, history of drug abuse He denies alcohol use, IV drug abuse, he does smoke tobacco  NSAIDs: None  Antiplts/Anticoagulants/Anti thrombotics: Aspirin 25 mg daily  GI Procedures: None  Past Medical History:  Diagnosis Date  . Hypertension   . Stroke [facility])     Past Surgical History:  Procedure Laterality Date  . APPENDECTOMY    . bowel obstruction    . EXTRACORPOREAL SHOCK WAVE LITHOTRIPSY Right 05/15/2019   Procedure: EXTRACORPOREAL SHOCK WAVE LITHOTRIPSY (ESWL);  Surgeon: Cleon Gustin, MD;  Location: [facility];  Service: Urology;  Laterality: Right;    Prior to Admission medications   Medication Sig Start Date End Date Taking? Authorizing Provider  amLODipine (NORVASC) 10 MG tablet TAKE 1 TABLET BY MOUTH EVERY DAY  09/01/20   Marnee Guarneri T, NP  aspirin 325 MG tablet Take 325 mg by mouth daily.    [provider]  citalopram (CELEXA) 20 MG tablet TAKE 1 TABLET BY MOUTH EVERY DAY 07/12/20   Cannady, Jolene T, NP  cyclobenzaprine (FLEXERIL) 10 MG tablet TAKE 1 TABLET BY MOUTH THREE TIMES A DAY AS NEEDED FOR MUSCLE SPASMS 02/17/20   Cannady, Jolene T, NP  metoprolol tartrate (LOPRESSOR) 25 MG tablet TAKE 1 TABLET BY MOUTH TWICE A DAY 07/12/20   Cannady, Jolene T, NP  naproxen (NAPROSYN) 500 MG tablet Take 1 tablet (500 mg total) by mouth 2 (two) times daily with a meal. 01/02/20   Cannady, Jolene T, NP  omeprazole (PRILOSEC) 20 MG capsule Take 1 capsule (20 mg total) by mouth daily. 09/01/20   Cannady, Henrine Screws T, NP  pravastatin (PRAVACHOL) 20 MG tablet TAKE 1 TABLET BY MOUTH EVERY DAY 09/16/19   Cannady, Jolene T, NP  sucralfate (CARAFATE) 1 g tablet Take 1 tablet (1 g total) by mouth 4 (four) times daily -  with meals and at bedtime. 09/01/20   Cannady, Henrine Screws T, NP  tamsulosin (FLOMAX) 0.4 MG CAPS capsule TAKE 1 CAPSULE BY MOUTH EVERY DAY 08/13/20   Marnee Guarneri T, NP    Current Facility-Administered Medications:  .  0.9 %  sodium chloride infusion, 10 mL/hr, Intravenous, Once, Paduchowski, Kevin, MD .  0.9 %  sodium chloride infusion, , Intravenous, Continuous, Agbata, Tochukwu, MD, Last Rate: 125  mL/hr at 09/02/20 1511, New Bag at 09/02/20 1511 .  nicotine (NICODERM CQ - dosed in mg/24 hours) patch 21 mg, 21 mg, Transdermal, Daily, Agbata, Tochukwu, MD .  ondansetron (ZOFRAN) tablet 4 mg, 4 mg, Oral, Q6H PRN **OR** ondansetron (ZOFRAN) injection 4 mg, 4 mg, Intravenous, Q6H PRN, Agbata, Tochukwu, MD .  pantoprazole (PROTONIX) 80 mg in sodium chloride 0.9 % 100 mL (0.8 mg/mL) infusion, 8 mg/hr, Intravenous, Continuous, Paduchowski, Kevin, MD  Current Outpatient Medications:  .  amLODipine (NORVASC) 10 MG tablet, TAKE 1 TABLET BY MOUTH EVERY DAY, Disp: 90 tablet, Rfl: 0 .  aspirin 325 MG tablet, Take  325 mg by mouth daily., Disp: , Rfl:  .  citalopram (CELEXA) 20 MG tablet, TAKE 1 TABLET BY MOUTH EVERY DAY, Disp: 90 tablet, Rfl: 0 .  cyclobenzaprine (FLEXERIL) 10 MG tablet, TAKE 1 TABLET BY MOUTH THREE TIMES A DAY AS NEEDED FOR MUSCLE SPASMS, Disp: 45 tablet, Rfl: 1 .  metoprolol tartrate (LOPRESSOR) 25 MG tablet, TAKE 1 TABLET BY MOUTH TWICE A DAY, Disp: 180 tablet, Rfl: 0 .  naproxen (NAPROSYN) 500 MG tablet, Take 1 tablet (500 mg total) by mouth 2 (two) times daily with a meal., Disp: 60 tablet, Rfl: 3 .  omeprazole (PRILOSEC) 20 MG capsule, Take 1 capsule (20 mg total) by mouth daily., Disp: 30 capsule, Rfl: 3 .  pravastatin (PRAVACHOL) 20 MG tablet, TAKE 1 TABLET BY MOUTH EVERY DAY, Disp: 90 tablet, Rfl: 3 .  sucralfate (CARAFATE) 1 g tablet, Take 1 tablet (1 g total) by mouth 4 (four) times daily -  with meals and at bedtime., Disp: 90 tablet, Rfl: 0 .  tamsulosin (FLOMAX) 0.4 MG CAPS capsule, TAKE 1 CAPSULE BY MOUTH EVERY DAY, Disp: 90 capsule, Rfl: 1  Family History  Problem Relation Age of Onset  . Heart disease Father   . Seizures Brother      Social History   Tobacco Use  . Smoking status: Current Every Day Smoker    Packs/day: 1.00    Years: 43.00    Pack years: 43.00  . Smokeless tobacco: Never Used  Vaping Use  . Vaping Use: Never used  Substance Use Topics  . Alcohol use: Not Currently    Alcohol/week: 0.0 standard drinks  . Drug use: Not Currently    Allergies as of 09/02/2020 - Review Complete 09/02/2020  Allergen Reaction Noted  . Codeine Hives 05/03/2015    Review of Systems:    All systems reviewed and negative except where noted in HPI.   Physical Exam:  Vital signs in last 24 hours: Temp:  [98.2 F (36.8 C)-98.6 F (37 C)] 98.2 F (36.8 C) (01/20 1418) Pulse Rate:  [93-107] 93 (01/20 1401) Resp:  [11-18] 18 (01/20 1401) BP: (101-141)/(75-93) 128/89 (01/20 1418) SpO2:  [96 %-99 %] 99 % (01/20 1400) Weight:  [68.9 kg] 68.9 kg (01/20 0748)    General:   Pleasant, cooperative in mild distress  Head:  Normocephalic and atraumatic. Eyes:   No icterus.   Conjunctiva pink. PERRLA. Ears:  Normal auditory acuity. Neck:  Supple; no masses or thyroidomegaly Lungs: Respirations even and unlabored. Lungs clear to auscultation bilaterally.   No wheezes, crackles, or rhonchi.  Heart:  Regular rate and rhythm;  Without murmur, clicks, rubs or gallops Abdomen:  Soft, nondistended, diffuse abdominal tenderness, Normal bowel sounds. No appreciable masses or hepatomegaly.  No rebound or guarding.  Rectal:  Not performed. Msk:  Symmetrical without gross deformities.  Strength normal Extremities:  Without edema, cyanosis or clubbing. Neurologic:  Alert and oriented x3;  grossly normal neurologically. Skin:  Intact without significant lesions or rashes. Cervical Nodes:  No significant cervical adenopathy. Psych:  Alert and cooperative. Normal affect.  LAB RESULTS: CBC Latest Ref Rng & Units 09/02/2020 09/01/2020 06/23/2020  WBC 4.0 - 10.5 K/uL 8.3 10.3 7.1  Hemoglobin 13.0 - 17.0 g/dL 8.9(L) 10.9(L) 14.9  Hematocrit 39.0 - 52.0 % 27.2(L) 32.8(L) 43.9  Platelets 150 - 400 K/uL 311 340 349    BMET BMP Latest Ref Rng & Units 09/02/2020 09/01/2020 06/23/2020  Glucose 70 - 99 mg/dL 128(H) 100(H) 86  BUN 6 - 20 mg/dL 38(H) 37(H) 7(L)  Creatinine 0.61 - 1.24 mg/dL 1.17 1.40(H) 0.95  BUN/Creat Ratio 10 - 24 - 26(H) 7(L)  Sodium 135 - 145 mmol/L 140 136 136  Potassium 3.5 - 5.1 mmol/L 4.0 5.1 5.4(H)  Chloride 98 - 111 mmol/L 107 103 99  CO2 22 - 32 mmol/L _0 Calcium 8.9 - 10.3 mg/dL 8.6(L) 9.4 9.2    LFT Hepatic Function Latest Ref Rng & Units 09/02/2020 09/01/2020 07/30/2020  Total Protein 6.5 - 8.1 g/dL 6.3(L) 6.7 7.4  Albumin 3.5 - 5.0 g/dL 3.5 4.3 4.6  AST 15 - 41 U/L _1 ALT 0 - 44 U/L 15 15 47(H)  Alk Phosphatase 38 - 126 U/L 47 66 113  Total Bilirubin 0.3 - 1.2 mg/dL 0.7 0.2 0.4  Bilirubin, Direct 0.00 - 0.40 mg/dL - - 0.12      STUDIES: CT ABDOMEN PELVIS W CONTRAST  Result Date: 09/02/2020 CLINICAL DATA:  Abdominal pain EXAM: CT ABDOMEN AND PELVIS WITH CONTRAST TECHNIQUE: Multidetector CT imaging of the abdomen and pelvis was performed using the standard protocol following bolus administration of intravenous contrast. CONTRAST:  162m OMNIPAQUE IOHEXOL 300 MG/ML  SOLN COMPARISON:  05/08/2019 FINDINGS: Lower chest: No acute abnormality. Hepatobiliary: No focal liver abnormality. Cholecystectomy. Intra and extrahepatic biliary dilatation without evidence of obstructing stone at least partially due to cholecystectomy state. Pancreas: Slightly increased dilatation of the main pancreatic duct within the head. Otherwise unremarkable. Spleen: Stable appearance. Adrenals/Urinary Tract: Adrenals are unremarkable. 3 mm nonobstructing calculus at the lower pole of the right kidney. Too small to characterize low-attenuation lesions of the left kidney. Persistent moderate right hydronephrosis. There is also right hydroureter now extending to the level of the mid ureter where there is an obstructing 7 mm calculus. Stomach/Bowel: Stomach is within normal limits apart from small hiatal hernia. Bowel is normal in caliber. Appendix is likely surgically absent. Vascular/Lymphatic: Aortic atherosclerosis. No enlarged lymph nodes identified. Reproductive: Stable appearance of the prostate. Other: No ascites.  No abdominal wall hernia. Musculoskeletal: Lower lumbar spine degenerative changes. No acute osseous abnormality. IMPRESSION: 7 mm obstructing calculus of the mid right ureter with proximal hydroureteronephrosis. Decreased right renal stone burden since 2020 with remaining 3 mm nonobstructing stone at the lower pole. Increased intra and extrahepatic biliary dilatation. Slightly increased dilatation of the main pancreatic duct within the head. No obstructing ampullary lesion identified on this study. MRI/MRCP recommended as an outpatient for  further evaluation. Electronically Signed   By: PMacy MisM.D.   On: 09/02/2020 15:40      Impression / Plan:   Mark AMESCUAis a 60y.o. Caucasian male with history of chronic tobacco use, s/p cholecystectomy, history of hypertension and stroke on chronic high-dose aspirin presented with abdominal pain, melena and hematemesis  Acute upper GI bleed in setting  of long-term NSAID use  Differentials include erosive esophagitis or esophageal ulcer or peptic ulcer disease or [place] No evidence of chronic liver disease/cirrhosis CT abdomen and pelvis did not reveal any acute intra-abdominal pathology Continue pantoprazole drip Okay with clear liquid diet N.p.o. effective 5 AM tomorrow Monitor CBC closely to maintain hemoglobin greater than 8 Recommend upper endoscopy tomorrow, SARS COVID-19 negative Recommend to avoid high-dose aspirin  Pancreatic duct dilation as well as intrahepatic and extrahepatic biliary dilation without evidence of biliary obstruction likely secondary to cholecystectomy state based on the CT scan LFTs are normal Pancreatic ductal dilation is likely secondary to underlying chronic pancreatitis from chronic tobacco use MRI/MRCP as outpatient   Thank you for involving me in the care of this patient.  GI will follow along with you    LOS: 0 days   Sherri Sear, MD  09/02/2020, 4:12 PM   Note: This dictation was prepared with Dragon dictation along with smaller phrase technology. Any transcriptional errors that result from this process are unintentional.

## 2020-09-02 NOTE — ED Notes (Signed)
Pt requesting pain meds - explained to pt that he received 50 mcg of Fentanyl at 1316

## 2020-09-02 NOTE — ED Notes (Signed)
ABO sent to lab

## 2020-09-02 NOTE — H&P (Signed)
History and Physical    Mark [name] IRJ:[id number] DOB: September 09, 1960 DOA: 09/02/2020  PCP: Marjie Skiff, NP   Patient coming from: Home  I have personally briefly reviewed patient's old medical records in [facility]  Chief Complaint: Rectal Bleeding  HPI: Mark [name] is a 60 y.o. male with medical history significant for hypertension, history of CVA, s/p multiple surgeries for SBO, nicotine dependence who presents to the ER for evaluation of bleeding per rectum which he describes as bright red for about 2 days associated with worsening periumbilical pain as well as hematemesis.  He rates his abdominal pain an 8 x 10 in intensity at its worst, it is non radiating.  His stools have been loose and mixed with blood. Patient admits to naproxen use for pain but states that he never takes it on empty stomach.  He denies feeling dizzy or lightheaded and has not had any falls.  He has a baseline hemoglobin of 14.9g/dl and was seen by his primary care provider 24 hours prior to his admission where he had a CBC done and his hemoglobin was 10.9g/dl.  Today on admission hemoglobin is 8.9g/dl.  He denies having any chest pain, no shortness of breath, no palpitations, no diaphoresis, no headache, no cough, no fever, no chills, no urinary frequency, no nocturia, no dysuria, no hematuria, no headache, no blurred vision, no mental status changes. Labs show sodium 140, potassium 4.0, chloride 107, bicarb 24, glucose 128, BUN 38, creatinine 1.17, calcium 8.6, alkaline phosphatase 47, albumin 3.5, AST 15, ALT 15, serum iron 78, total iron binding capacity 97, white count 8.3, hemoglobin 8.9, hematocrit 27.2, MCV 97.8, RDW 13.7, platelet count 311 Respiratory viral panel is pending CT scan of abdomen and pelvis was ordered but results are still pending at the time of this H&P    ED Course: Patient is a 60 year old Caucasian male who presents to the ER for evaluation of rectal bleeding and is  found to have a significant drop in his H&H from his baseline.  He will be admitted to the hospital for acute blood loss anemia and has been transfused 1 unit of packed RBC.  GI has been consulted and plans for upper endoscopy in a.m.  Review of Systems: As per HPI otherwise all systems reviewed and negative.    Past Medical History:  Diagnosis Date  . Hypertension   . Stroke [facility])     Past Surgical History:  Procedure Laterality Date  . APPENDECTOMY    . bowel obstruction    . EXTRACORPOREAL SHOCK WAVE LITHOTRIPSY Right 05/15/2019   Procedure: EXTRACORPOREAL SHOCK WAVE LITHOTRIPSY (ESWL);  Surgeon: Malen Gauze, MD;  Location: [facility];  Service: Urology;  Laterality: Right;     reports that he has been smoking. He has a 43.00 pack-year smoking history. He has never used smokeless tobacco. He reports previous alcohol use. He reports previous drug use.  Allergies  Allergen Reactions  . Codeine Hives    Family History  Problem Relation Age of Onset  . Heart disease Father   . Seizures Brother      Prior to Admission medications   Medication Sig Start Date End Date Taking? Authorizing Provider  amLODipine (NORVASC) 10 MG tablet TAKE 1 TABLET BY MOUTH EVERY DAY 09/01/20   Aura Dials T, NP  aspirin 325 MG tablet Take 325 mg by mouth daily.    [provider]  citalopram (CELEXA) 20 MG tablet TAKE 1 TABLET BY MOUTH EVERY  DAY 07/12/20   Cannady, Corrie Dandy T, NP  cyclobenzaprine (FLEXERIL) 10 MG tablet TAKE 1 TABLET BY MOUTH THREE TIMES A DAY AS NEEDED FOR MUSCLE SPASMS 02/17/20   Cannady, Jolene T, NP  metoprolol tartrate (LOPRESSOR) 25 MG tablet TAKE 1 TABLET BY MOUTH TWICE A DAY 07/12/20   Cannady, Jolene T, NP  naproxen (NAPROSYN) 500 MG tablet Take 1 tablet (500 mg total) by mouth 2 (two) times daily with a meal. 01/02/20   Cannady, Jolene T, NP  omeprazole (PRILOSEC) 20 MG capsule Take 1 capsule (20 mg total) by mouth daily. 09/01/20   Cannady, Corrie Dandy T, NP   pravastatin (PRAVACHOL) 20 MG tablet TAKE 1 TABLET BY MOUTH EVERY DAY 09/16/19   Cannady, Jolene T, NP  sucralfate (CARAFATE) 1 g tablet Take 1 tablet (1 g total) by mouth 4 (four) times daily -  with meals and at bedtime. 09/01/20   Aura Dials T, NP  tamsulosin (FLOMAX) 0.4 MG CAPS capsule TAKE 1 CAPSULE BY MOUTH EVERY DAY 08/13/20   Aura Dials T, NP    Physical Exam: Vitals:   09/02/20 1345 09/02/20 1400 09/02/20 1401 09/02/20 1418  BP: (!) 141/93 (!) 125/91 (!) 125/91 128/89  Pulse: 96 95 93   Resp: 17 11 18    Temp: 98.3 F (36.8 C)   98.2 F (36.8 C)  TempSrc: Oral   Oral  SpO2: 98% 99%    Weight:      Height:         Vitals:   09/02/20 1345 09/02/20 1400 09/02/20 1401 09/02/20 1418  BP: (!) 141/93 (!) 125/91 (!) 125/91 128/89  Pulse: 96 95 93   Resp: 17 11 18    Temp: 98.3 F (36.8 C)   98.2 F (36.8 C)  TempSrc: Oral   Oral  SpO2: 98% 99%    Weight:      Height:        Constitutional: NAD, alert and oriented x 3 Eyes: PERRL, lids and conjunctivae pallor ENMT: Mucous membranes are moist.  Neck: normal, supple, no masses, no thyromegaly Respiratory: clear to auscultation bilaterally, no wheezing, no crackles. Normal respiratory effort. No accessory muscle use.  Cardiovascular: Regular rate and rhythm, no murmurs / rubs / gallops. No extremity edema. 2+ pedal pulses. No carotid bruits.  Abdomen: Periumbilical tenderness, no masses palpated. No hepatosplenomegaly. Bowel sounds positive.  Multiple surgical scars on anterior abdominal wall from prior surgery Musculoskeletal: no clubbing / cyanosis. No joint deformity upper and lower extremities.  Skin: no rashes, lesions, ulcers.  Neurologic: No gross focal neurologic deficit. Psychiatric: Normal mood and affect.   Labs on Admission: I have personally reviewed following labs and imaging studies  CBC: Recent Labs  Lab 09/01/20 1553 09/02/20 0758  WBC 10.3 8.3  NEUTROABS 7.1*  --   HGB 10.9* 8.9*  HCT  32.8* 27.2*  MCV 97 97.8  PLT 340 311   Basic Metabolic Panel: Recent Labs  Lab 09/01/20 1607 09/02/20 0758  NA 136 140  K 5.1 4.0  CL 103 107  CO2 20 24  GLUCOSE 100* 128*  BUN 37* 38*  CREATININE 1.40* 1.17  CALCIUM 9.4 8.6*   GFR: Estimated Creatinine Clearance: 65.4 mL/min (by C-G formula based on SCr of 1.17 mg/dL). Liver Function Tests: Recent Labs  Lab 09/01/20 1607 09/02/20 0758  AST 13 15  ALT 15 15  ALKPHOS 66 47  BILITOT 0.2 0.7  PROT 6.7 6.3*  ALBUMIN 4.3 3.5   No results for input(s): LIPASE,  AMYLASE in the last 168 hours. No results for input(s): AMMONIA in the last 168 hours. Coagulation Profile: No results for input(s): INR, PROTIME in the last 168 hours. Cardiac Enzymes: No results for input(s): CKTOTAL, CKMB, CKMBINDEX, TROPONINI in the last 168 hours. BNP (last 3 results) No results for input(s): PROBNP in the last 8760 hours. HbA1C: No results for input(s): HGBA1C in the last 72 hours. CBG: No results for input(s): GLUCAP in the last 168 hours. Lipid Profile: No results for input(s): CHOL, HDL, LDLCALC, TRIG, CHOLHDL, LDLDIRECT in the last 72 hours. Thyroid Function Tests: Recent Labs    09/01/20 1607  TSH 3.950   Anemia Panel: Recent Labs    09/01/20 1607  FERRITIN 79  TIBC 297  IRON 78   Urine analysis:    Component Value Date/Time   COLORURINE YELLOW (A) 07/04/2017 0043   APPEARANCEUR Clear 09/01/2020 1546   LABSPEC 1.006 07/04/2017 0043   LABSPEC 1.008 09/12/2011 1930   PHURINE 5.0 07/04/2017 0043   GLUCOSEU Negative 09/01/2020 1546   GLUCOSEU Negative 09/12/2011 1930   HGBUR NEGATIVE 07/04/2017 0043   BILIRUBINUR Negative 09/01/2020 1546   BILIRUBINUR Negative 09/12/2011 1930   KETONESUR NEGATIVE 07/04/2017 0043   PROTEINUR Negative 09/01/2020 1546   PROTEINUR NEGATIVE 07/04/2017 0043   NITRITE Negative 09/01/2020 1546   NITRITE NEGATIVE 07/04/2017 0043   LEUKOCYTESUR Trace (A) 09/01/2020 1546   LEUKOCYTESUR  Negative 09/12/2011 1930    Radiological Exams on Admission: No results found.  EKG: Independently reviewed.   Assessment/Plan Principal Problem:   Acute blood loss anemia Active Problems:   Anxiety and depression   Hypertension   Nicotine dependence, cigarettes, w unsp disorders     Acute blood loss anemia From a GI source Patient presents for evaluation of hematemesis and rectal bleeding At baseline hemoglobin is 14.9g/dl and today on admission his hemoglobin is 8.9g/dl Obtain serial H&H and transfuse as needed Patient was transfused 1 unit of packed RBC in the ER Concern for possible NSAID induced peptic ulcer disease/gastritis Continue Protonix drip Will request GI consult for possible upper endoscopy   Hypertension Hold all antihypertensive medications for now since patient is normotensive    Anxiety/depression Hold Celexa for now    Nicotine dependence Smoking cessation has been discussed with patient in detail Will place patient on nicotine transdermal patch 21 mg daily    DVT prophylaxis: SCD Code Status: Full Family Communication: Greater than 50% of time was spent discussing patient's condition and plan of care with him at the bedside.  All questions and concerns have been addressed.  He verbalizes understanding and agrees with the plan. Disposition Plan: Back to previous home environment Consults called: Gastroenterology    Lucile Shutters MD [facility]     09/02/2020, 2:20 PM

## 2020-09-02 NOTE — Telephone Encounter (Signed)
Copied from CRM [id number]. Topic: General - Other >> Sep 02, 2020  8:01 AM Gwenlyn Fudge wrote: Reason for CRM: Pts mother calling to let pts PCP know that the pt had to be delivered to the hospital this morning. She states that the pt was coughing and spitting up blood this morning. She is requesting to speak with PCP as she is very concerned. Please advise.

## 2020-09-02 NOTE — Progress Notes (Signed)
Currently in ER, as was advised by PCP if worsening.

## 2020-09-02 NOTE — Telephone Encounter (Signed)
Spoke to [place], patient mother, on phone and offered her reassurance.  Discussed with her how PCP had told patient yesterday if any worsening symptoms he was to go to ER, so he followed instructions and is in appropriate place at this time.  Discussed with her they would most likely admit him and work on figuring out where bleed is coming from.  Patient has not had colonoscopy as of yet, missed in 2020, so this will most likely be done.  She state appreciation for call and reassurance.  Reports she can not take care of him like that and that if he comes home will need a ride.

## 2020-09-02 NOTE — ED Provider Notes (Signed)
[facility] Emergency Department Provider Note  Time seen: 1:02 PM  I have reviewed the triage vital signs and the nursing notes.   HISTORY  Chief Complaint GI Bleeding   HPI Mark [name] is a 60 y.o. male with a past medical history of hypertension, CVA, prior SBO's status post surgeries, presents to the emergency department for dark stool nausea vomiting.  According to the patient for the past 2 days he has been experiencing dark stool, went to his doctor yesterday had blood work performed which resulted today down from 14.5-10.5, told to go to the emergency department.  Patient also states this morning he became nauseated and vomited with bright red blood in his vomit.  Patient denies any alcohol use or heavy NSAID use besides a daily aspirin.  No other anticoagulation.  No history of GI bleed in the past.   Patient states moderate dull diffuse abdominal discomfort and nausea.  Past Medical History:  Diagnosis Date  . Hypertension   . Stroke [facility])     Patient Active Problem List   Diagnosis Date Noted  . Diarrhea 09/01/2020  . Dark stools 09/01/2020  . Aortic atherosclerosis (HCC) 05/13/2020  . Back pain 01/02/2020  . 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 History of kidney stones 05/09/2019  . Pulmonary nodule 05/09/2019  . Paraseptal emphysema (HCC) 05/09/2019  . Fatigue 04/30/2019  . Nicotine dependence, cigarettes, w unsp disorders 04/30/2019  . Hyperlipidemia LDL goal <70 09/05/2018  . Elevated hemoglobin A1c 09/03/2018  . Cognitive impairment 12/15/2016  . History of opioid abuse (HCC) 12/30/2015  . History of intestinal obstruction 05/03/2015  . 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 Anxiety and depression 05/03/2015  . Hypertension 05/03/2015  . 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 History of CVA (cerebrovascular accident) 05/03/2015    Past Surgical History:  Procedure Laterality Date  .  APPENDECTOMY    . bowel obstruction    . EXTRACORPOREAL SHOCK WAVE LITHOTRIPSY Right 05/15/2019   Procedure: EXTRACORPOREAL SHOCK WAVE LITHOTRIPSY (ESWL);  Surgeon: Malen Gauze, MD;  Location: [facility];  Service: Urology;  Laterality: Right;    Prior to Admission medications   Medication Sig Start Date End Date Taking? Authorizing Provider  amLODipine (NORVASC) 10 MG tablet TAKE 1 TABLET BY MOUTH EVERY DAY 09/01/20   Aura Dials T, NP  aspirin 325 MG tablet Take 325 mg by mouth daily.    [provider]  citalopram (CELEXA) 20 MG tablet TAKE 1 TABLET BY MOUTH EVERY DAY 07/12/20   Cannady, Jolene T, NP  cyclobenzaprine (FLEXERIL) 10 MG tablet TAKE 1 TABLET BY MOUTH THREE TIMES A DAY AS NEEDED FOR MUSCLE SPASMS 02/17/20   Cannady, Jolene T, NP  metoprolol tartrate (LOPRESSOR) 25 MG tablet TAKE 1 TABLET BY MOUTH TWICE A DAY 07/12/20   Cannady, Jolene T, NP  naproxen (NAPROSYN) 500 MG tablet Take 1 tablet (500 mg total) by mouth 2 (two) times daily with a meal. 01/02/20   Cannady, Jolene T, NP  omeprazole (PRILOSEC) 20 MG capsule Take 1 capsule (20 mg total) by mouth daily. 09/01/20   Cannady, Corrie Dandy T, NP  pravastatin (PRAVACHOL) 20 MG tablet TAKE 1 TABLET BY MOUTH EVERY DAY 09/16/19   Cannady, Jolene T, NP  sucralfate (CARAFATE) 1 g tablet Take 1 tablet (1 g total) by mouth 4 (four) times daily -  with meals and at bedtime. 09/01/20   Cannady, Corrie Dandy T, NP  tamsulosin (FLOMAX) 0.4 MG CAPS  capsule TAKE 1 CAPSULE BY MOUTH EVERY DAY 08/13/20   Aura Dials T, NP    Allergies  Allergen Reactions  . Codeine Hives    Family History  Problem Relation Age of Onset  . Heart disease Father   . Seizures Brother     Social History Social History   Tobacco Use  . Smoking status: Current Every Day Smoker    Packs/day: 1.00    Years: 43.00    Pack years: 43.00  . Smokeless tobacco: Never Used  Vaping Use  . Vaping Use: Never used  Substance Use Topics  . Alcohol use: Not  Currently    Alcohol/week: 0.0 standard drinks  . Drug use: Not Currently    Review of Systems Constitutional: Negative for fever. Cardiovascular: Negative for chest pain. Respiratory: Negative for shortness of breath. Gastrointestinal: Abdominal discomfort moderate dull.  Positive for dark black stool x2 days as well as intermittent nausea vomiting this morning. Genitourinary: Negative for urinary compaints Musculoskeletal: Negative for musculoskeletal complaints Neurological: Negative for headache All other ROS negative  ____________________________________________   PHYSICAL EXAM:  VITAL SIGNS: ED Triage Vitals  Enc Vitals Group     BP 09/02/20 0753 131/90     Pulse Rate 09/02/20 0753 (!) 107     Resp 09/02/20 0753 18     Temp 09/02/20 0753 98.6 F (37 C)     Temp Source 09/02/20 0753 Oral     SpO2 09/02/20 0753 97 %     Weight 09/02/20 0748 151 lb 14.4 oz (68.9 kg)     Height 09/02/20 0748 5\' 9"  (1.753 m)     Head Circumference --      Peak Flow --      Pain Score 09/02/20 0748 0     Pain Loc --      Pain Edu? --      Excl. in GC? --     Constitutional: Alert and oriented.  Appears to be in mild discomfort. Eyes: Normal exam ENT      Head: Normocephalic and atraumatic.      Mouth/Throat: Mucous membranes are moist. Cardiovascular: Normal rate, regular rhythm around 100 bpm.  Respiratory: Normal respiratory effort without tachypnea nor retractions. Breath sounds are clear  Gastrointestinal: Soft and nontender. No distention.  Rectal examination shows dark stool strongly guaiac positive. Musculoskeletal: Nontender with normal range of motion in all extremities. Neurologic:  Normal speech and language. No gross focal neurologic deficits Skin:  Skin is warm, dry and intact.  Psychiatric: Mood and affect are normal.   ____________________________________________   RADIOLOGY  CT pending at the time of  admission.  ____________________________________________   INITIAL IMPRESSION / ASSESSMENT AND PLAN / ED COURSE  Pertinent labs & imaging results that were available during my care of the patient were reviewed by me and considered in my medical decision making (see chart for details).   Patient presents emergency department for dark stool now with nausea vomiting this morning with what appeared to be blood in the vomit per patient.  No history of GI bleed previously.  Patient's rectal exam is positive with melena.  Patient's baseline blood count appears to be around 14.5 down to 10.5 now 8.9 15 hours later indicating likely significant GI bleed.  Patient is tachycardic around 100 bpm during my evaluation.  Given the significant decrease in hemoglobin over the past 15 hours I have typed and screened the patient we will transfuse 1 unit of blood.  We will start the  patient on a Protonix bolus and infusion.  We will dose IV fluids given his blood pressure 101/78.  We will send for CT imaging to rule out other intra-abdominal pathology given the patient's significant abdominal surgery history.  Patient will require admission to the hospital once his emergency department work-up is been complete.  Patient agreeable to plan of care.  Mark [name] was evaluated in Emergency Department on 09/02/2020 for the symptoms described in the history of present illness. He was evaluated in the context of the global COVID-19 pandemic, which necessitated consideration that the patient might be at risk for infection with the SARS-CoV-2 virus that causes COVID-19. Institutional protocols and algorithms that pertain to the evaluation of patients at risk for COVID-19 are in a state of rapid change based on information released by regulatory bodies including the CDC and federal and state organizations. These policies and algorithms were followed during the patient's care in the ED.  CRITICAL CARE Performed by: Minna Antis   Total critical care time: 30 minutes  Critical care time was exclusive of separately billable procedures and treating other patients.  Critical care was necessary to treat or prevent imminent or life-threatening deterioration.  Critical care was time spent personally by me on the following activities: development of treatment plan with patient and/or surrogate as well as nursing, discussions with consultants, evaluation of patient's response to treatment, examination of patient, obtaining history from patient or surrogate, ordering and performing treatments and interventions, ordering and review of laboratory studies, ordering and review of radiographic studies, pulse oximetry and re-evaluation of patient's condition.  ____________________________________________   FINAL CLINICAL IMPRESSION(S) / ED DIAGNOSES  GI bleed   Minna Antis, MD 09/02/20 1351

## 2020-09-03 ENCOUNTER — Inpatient Hospital Stay: Payer: Medicare HMO | Admitting: Anesthesiology

## 2020-09-03 ENCOUNTER — Encounter
Admission: EM | Disposition: A | Discharge status: Home / Self Care | Payer: Self-pay | Source: Home / Self Care | Attending: Emergency Medicine

## 2020-09-03 ENCOUNTER — Encounter: Payer: Self-pay | Admitting: Internal Medicine

## 2020-09-03 ENCOUNTER — Other Ambulatory Visit: Payer: Self-pay

## 2020-09-03 DIAGNOSIS — Z87442 Personal history of urinary calculi: Secondary | ICD-10-CM | POA: Diagnosis not present

## 2020-09-03 DIAGNOSIS — K259 Gastric ulcer, unspecified as acute or chronic, without hemorrhage or perforation: Secondary | ICD-10-CM | POA: Diagnosis not present

## 2020-09-03 DIAGNOSIS — D62 Acute posthemorrhagic anemia: Secondary | ICD-10-CM | POA: Diagnosis not present

## 2020-09-03 DIAGNOSIS — K920 Hematemesis: Secondary | ICD-10-CM | POA: Diagnosis not present

## 2020-09-03 DIAGNOSIS — K921 Melena: Secondary | ICD-10-CM | POA: Diagnosis not present

## 2020-09-03 DIAGNOSIS — D649 Anemia, unspecified: Secondary | ICD-10-CM | POA: Diagnosis not present

## 2020-09-03 DIAGNOSIS — J449 Chronic obstructive pulmonary disease, unspecified: Secondary | ICD-10-CM | POA: Diagnosis not present

## 2020-09-03 DIAGNOSIS — I10 Essential (primary) hypertension: Secondary | ICD-10-CM | POA: Diagnosis not present

## 2020-09-03 DIAGNOSIS — M1990 Unspecified osteoarthritis, unspecified site: Secondary | ICD-10-CM | POA: Diagnosis not present

## 2020-09-03 DIAGNOSIS — E785 Hyperlipidemia, unspecified: Secondary | ICD-10-CM | POA: Diagnosis not present

## 2020-09-03 HISTORY — PX: ESOPHAGOGASTRODUODENOSCOPY (EGD) WITH PROPOFOL: SHX5813

## 2020-09-03 LAB — TYPE AND SCREEN
ABO/RH(D): A NEG
Antibody Screen: NEGATIVE
Unit division: 0

## 2020-09-03 LAB — HIV ANTIBODY (ROUTINE TESTING W REFLEX): HIV Screen 4th Generation wRfx: NONREACTIVE

## 2020-09-03 LAB — BASIC METABOLIC PANEL
Anion gap: 6 (ref 5–15)
BUN: 22 mg/dL — ABNORMAL HIGH (ref 6–20)
CO2: 22 mmol/L (ref 22–32)
Calcium: 8 mg/dL — ABNORMAL LOW (ref 8.9–10.3)
Chloride: 108 mmol/L (ref 98–111)
Creatinine, Ser: 1.02 mg/dL (ref 0.61–1.24)
GFR, Estimated: >60 mL/min (ref >60)
Glucose, Bld: 97 mg/dL (ref 70–99)
Potassium: 3.8 mmol/L (ref 3.5–5.1)
Sodium: 136 mmol/L (ref 135–145)

## 2020-09-03 LAB — HEMOGLOBIN AND HEMATOCRIT, BLOOD
HCT: 23.6 % — ABNORMAL LOW (ref 39.0–52.0)
HCT: 25 % — ABNORMAL LOW (ref 39.0–52.0)
HCT: 23.2 % — ABNORMAL LOW (ref 39.0–52.0)
Hemoglobin: 7.9 g/dL — ABNORMAL LOW (ref 13.0–17.0)
Hemoglobin: 8.4 g/dL — ABNORMAL LOW (ref 13.0–17.0)
Hemoglobin: 7.8 g/dL — ABNORMAL LOW (ref 13.0–17.0)

## 2020-09-03 LAB — BPAM RBC
Blood Product Expiration Date: 202202012359
ISSUE DATE / TIME: 202201201339
Unit Type and Rh: 600

## 2020-09-03 LAB — NOVEL CORONAVIRUS, NAA: SARS-CoV-2, NAA: NOT DETECTED

## 2020-09-03 LAB — SARS-COV-2, NAA 2 DAY TAT

## 2020-09-03 LAB — VITAMIN B12: Vitamin B-12: 99 pg/mL — ABNORMAL LOW (ref 180–914)

## 2020-09-03 SURGERY — ESOPHAGOGASTRODUODENOSCOPY (EGD) WITH PROPOFOL
Anesthesia: General

## 2020-09-03 MED ORDER — ONDANSETRON HCL 4 MG PO TABS: 4.0000 mg | ORAL_TABLET | Freq: Three times a day (TID) | ORAL | 0 refills | Status: DC | PRN

## 2020-09-03 MED ORDER — PROPOFOL 500 MG/50ML IV EMUL
INTRAVENOUS | Status: AC
  Filled 2020-09-03: qty 50

## 2020-09-03 MED ORDER — FENTANYL CITRATE (PF) 100 MCG/2ML IJ SOLN
50.0000 ug | Freq: Once | INTRAMUSCULAR | Status: AC
  Administered 2020-09-03: 50 ug via INTRAVENOUS
  Filled 2020-09-03: qty 2

## 2020-09-03 MED ORDER — MIDAZOLAM HCL 2 MG/2ML IJ SOLN
INTRAMUSCULAR | Status: AC
  Filled 2020-09-03: qty 2

## 2020-09-03 MED ORDER — PROPOFOL 500 MG/50ML IV EMUL
INTRAVENOUS | Status: DC | PRN
  Administered 2020-09-03: 75 ug/kg/min via INTRAVENOUS

## 2020-09-03 MED ORDER — SODIUM CHLORIDE 0.9 % IV SOLN: INTRAVENOUS | Status: DC

## 2020-09-03 MED ORDER — LIDOCAINE HCL (PF) 2 % IJ SOLN
INTRAMUSCULAR | Status: DC | PRN
  Administered 2020-09-03: 60 mg

## 2020-09-03 MED ORDER — FENTANYL CITRATE (PF) 100 MCG/2ML IJ SOLN
INTRAMUSCULAR | Status: AC
  Filled 2020-09-03: qty 4

## 2020-09-03 MED ORDER — ASPIRIN EC 81 MG PO TBEC: 81.0000 mg | DELAYED_RELEASE_TABLET | Freq: Every day | ORAL | 2 refills | Status: AC

## 2020-09-03 MED ORDER — GOLYTELY 236 G PO SOLR: 4000.0000 mL | Freq: Once | ORAL | 0 refills | Status: AC

## 2020-09-03 MED ORDER — MIDAZOLAM HCL 5 MG/5ML IJ SOLN
INTRAMUSCULAR | Status: DC | PRN
  Administered 2020-09-03: 2 mg via INTRAVENOUS

## 2020-09-03 MED ORDER — PROPOFOL 10 MG/ML IV BOLUS
INTRAVENOUS | Status: DC | PRN
  Administered 2020-09-03: 20 mg via INTRAVENOUS
  Administered 2020-09-03: 30 mg via INTRAVENOUS

## 2020-09-03 NOTE — Transfer of Care (Signed)
Immediate Anesthesia Transfer of Care Note  Patient: Mark [name]  Procedure(s) Performed: ESOPHAGOGASTRODUODENOSCOPY (EGD) WITH PROPOFOL (N/A )  Patient Location: PACU  Anesthesia Type:General  Level of Consciousness: sedated  Airway & Oxygen Therapy: Patient Spontanous Breathing  Post-op Assessment: Report given to RN and Post -op Vital signs reviewed and stable  Post vital signs: Reviewed and stable  Last Vitals:  Vitals Value Taken Time  BP 144/97 09/03/20 1121  Temp    Pulse 92 09/03/20 1121  Resp 15 09/03/20 1121  SpO2 95 % 09/03/20 1121  Vitals shown include unvalidated device data.  Last Pain:  Vitals:   09/03/20 1004  TempSrc: Oral  PainSc: 9       Patients Stated Pain Goal: 2 (09/03/20 1004)  Complications: No complications documented.

## 2020-09-03 NOTE — ED Notes (Signed)
Pt had incontinent BM. Black tarry stool noted. Peri care performed and new gown placed on pt.

## 2020-09-03 NOTE — Discharge Summary (Signed)
Physician Discharge Summary  [name] NWG:[id number] DOB: July 23, 1961 DOA: 09/02/2020  PCP: Marjie Skiffannady, Jolene T, NP  Admit date: 09/02/2020 Discharge date: 09/03/2020  Admitted From: Home  Disposition:  Home   Recommendations for Outpatient Follow-up:  1. Follow up with PCP in 1-2 weeks 2. Follow up with Gastroenterology in 2-4 weeks    Home Health: None  Equipment/Devices: None  Discharge Condition: Good  CODE STATUS: FULL    Brief/Interim Summary: Mark [name] is a 60 year old M with hx HTN, CVA, recurrent SBO, and smoking who presented with several days bright red blood per rectum as well as hematemesis and abdominal discomfort.  Patient takes naproxen regularly and last few days, has had drop in his hemoglobin, as well as some bleeding described above.  In the ER globin 8.9, creatinine normal, BUN elevated.  CT abdomen and pelvis was unremarkable      PRINCIPAL HOSPITAL DIAGNOSIS: Acute upper GI bleed due to gastritis    Discharge Diagnoses:  Acute blood loss anemia Acute upper GI bleed due to gastritis The patient was admitted and started on Protonix.  He underwent endoscopy by Dr. Allegra LaiVanga that showed gastritis and duodenitis, but no stigmata of recent bleeding.  He had no more melena or hematemesis or hematochezia in the hospital, and was hemodynamically stable.  He will stop NSAIDs and follow up with Dr. Allegra LaiVanga for consideration of endoscopy as an outpatient.    Hypertension Cerebrovascular disease  Depression  Smoking Smoking cessation recommended, modalities        Discharge Instructions  Discharge Instructions    Diet - low sodium heart healthy   Complete by: As directed    Discharge instructions   Complete by: As directed    Resume all your home medicines.  EXCEPT: Do not take NSAIDs (these are over the counter medicines like ibuprofen or Aleve or Goody's or BC powders)  Do not take FULL DOSE aspirin 325 mg Do take aspirin 81 mg  daily, not the full dose kind.   Increase activity slowly   Complete by: As directed      Allergies as of 09/03/2020      Reactions   Codeine Hives      Medication List    STOP taking these medications   aspirin 325 MG tablet Replaced by: aspirin EC 81 MG tablet   naproxen 500 MG tablet Commonly known as: Naprosyn     TAKE these medications   amLODipine 10 MG tablet Commonly known as: NORVASC TAKE 1 TABLET BY MOUTH EVERY DAY   aspirin EC 81 MG tablet Take 1 tablet (81 mg total) by mouth daily. Swallow whole. Replaces: aspirin 325 MG tablet   citalopram 20 MG tablet Commonly known as: CELEXA TAKE 1 TABLET BY MOUTH EVERY DAY   cyclobenzaprine 10 MG tablet Commonly known as: FLEXERIL TAKE 1 TABLET BY MOUTH THREE TIMES A DAY AS NEEDED FOR MUSCLE SPASMS What changed: See the new instructions.   Golytely 236 g solution Generic drug: polyethylene glycol Take 4,000 mLs by mouth once for 1 dose.   metoprolol tartrate 25 MG tablet Commonly known as: LOPRESSOR TAKE 1 TABLET BY MOUTH TWICE A DAY   omeprazole 20 MG capsule Commonly known as: PRILOSEC Take 1 capsule (20 mg total) by mouth daily.   ondansetron 4 MG tablet Commonly known as: ZOFRAN Take 1 tablet (4 mg total) by mouth every 8 (eight) hours as needed for nausea or vomiting.   pravastatin 20 MG tablet Commonly known as: PRAVACHOL TAKE  1 TABLET BY MOUTH EVERY DAY   sucralfate 1 g tablet Commonly known as: Carafate Take 1 tablet (1 g total) by mouth 4 (four) times daily -  with meals and at bedtime.   tamsulosin 0.4 MG Caps capsule Commonly known as: FLOMAX TAKE 1 CAPSULE BY MOUTH EVERY DAY       Follow-up Information    Vanga, Loel Dubonnet, MD. Schedule an appointment as soon as possible for a visit in 1 month(s).   Specialty: Gastroenterology Contact information: [address] [place] [address] [phone]              Allergies  Allergen Reactions  . Codeine Hives     Consultations:  Gastroenterology   Procedures/Studies: CT ABDOMEN PELVIS W CONTRAST  Result Date: 09/02/2020 CLINICAL DATA:  Abdominal pain EXAM: CT ABDOMEN AND PELVIS WITH CONTRAST TECHNIQUE: Multidetector CT imaging of the abdomen and pelvis was performed using the standard protocol following bolus administration of intravenous contrast. CONTRAST:  OMNIPAQUE IOHEXOL 300 MG/ML  SOLN COMPARISON:  05/08/2019 FINDINGS: Lower chest: No acute abnormality. Hepatobiliary: No focal liver abnormality. Cholecystectomy. Intra and extrahepatic biliary dilatation without evidence of obstructing stone at least partially due to cholecystectomy state. Pancreas: Slightly increased dilatation of the main pancreatic duct within the head. Otherwise unremarkable. Spleen: Stable appearance. Adrenals/Urinary Tract: Adrenals are unremarkable. 3 mm nonobstructing calculus at the lower pole of the right kidney. Too small to characterize low-attenuation lesions of the left kidney. Persistent moderate right hydronephrosis. There is also right hydroureter now extending to the level of the mid ureter where there is an obstructing 7 mm calculus. Stomach/Bowel: Stomach is within normal limits apart from small hiatal hernia. Bowel is normal in caliber. Appendix is likely surgically absent. Vascular/Lymphatic: Aortic atherosclerosis. No enlarged lymph nodes identified. Reproductive: Stable appearance of the prostate. Other: No ascites.  No abdominal wall hernia. Musculoskeletal: Lower lumbar spine degenerative changes. No acute osseous abnormality. IMPRESSION: 7 mm obstructing calculus of the mid right ureter with proximal hydroureteronephrosis. Decreased right renal stone burden since 2020 with remaining 3 mm nonobstructing stone at the lower pole. Increased intra and extrahepatic biliary dilatation. Slightly increased dilatation of the main pancreatic duct within the head. No obstructing ampullary lesion identified on this  study. MRI/MRCP recommended as an outpatient for further evaluation. Electronically Signed   By: Guadlupe Spanish M.D.   On: 09/02/2020 15:40       Subjective: Patient's had no further melena.  He is eager to eat and go home.  No fever, confusion.  No dizziness, no syncope.  Discharge Exam: Vitals:   09/03/20 1131 09/03/20 1141  BP: (!) 135/92 128/87  Pulse: 95 94  Resp: 11 16  Temp:    SpO2: 97% 100%   Vitals:   09/03/20 1004 09/03/20 1121 09/03/20 1131 09/03/20 1141  BP: (!) 153/105 (!) 144/97 (!) 135/92 128/87  Pulse: 99 91 95 94  Resp: 20 14 11 16   Temp: 98.7 F (37.1 C) 97.6 F (36.4 C)    TempSrc: Oral Temporal    SpO2: 99% 95% 97% 100%  Weight: 68.9 kg     Height: 5\' 9"  (1.753 m)       General: Pt is alert, awake, not in acute distress Cardiovascular: RRR, nl S1-S2, no murmurs appreciated.   No LE edema.   Respiratory: Normal respiratory rate and rhythm.  CTAB without rales or wheezes. Abdominal: Abdomen soft and non-tender.  No distension or HSM.   Neuro/Psych: Strength symmetric in upper  and lower extremities.  Judgment and insight appear normal.   The results of significant diagnostics from this hospitalization (including imaging, microbiology, ancillary and laboratory) are listed below for reference.     Microbiology: Recent Results (from the past 240 hour(s))  Microscopic Examination     Status: Abnormal   Collection Time: 09/01/20  3:46 PM   Urine  Result Value Ref Range Status   WBC, UA 6-10 (A) 0 - 5 /hpf Final   RBC 11-30 (A) 0 - 2 /hpf Final   Epithelial Cells (non renal) 0-10 0 - 10 /hpf Final   Bacteria, UA None seen None seen/Few Final  Novel Coronavirus, NAA ([facility])     Status: None   Collection Time: 09/01/20  4:35 PM   Specimen: Nasopharyngeal(NP) swabs in vial transport medium  Result Value Ref Range Status   SARS-CoV-2, NAA Not Detected Not Detected Final    Comment: This nucleic acid amplification test was developed and its  performance characteristics determined by [facility]. Nucleic acid amplification tests include RT-PCR and TMA. This test has not been FDA cleared or approved. This test has been authorized by FDA under an Emergency Use Authorization (EUA). This test is only authorized for the duration of time the declaration that circumstances exist justifying the authorization of the emergency use of in vitro diagnostic tests for detection of SARS-CoV-2 virus and/or diagnosis of COVID-19 infection under section 564(b)(1) of the Act, 21 U.S.C. 161WRU-0(A360bbb-3(b) (1), unless the authorization is terminated or revoked sooner. When diagnostic testing is negative, the possibility of a false negative result should be considered in the context of a patient's recent exposures and the presence of clinical signs and symptoms consistent with COVID-19. An individual without symptoms of COVID-19 and who is not shedding SARS-CoV-2 virus wo uld expect to have a negative (not detected) result in this assay.   SARS-COV-2, NAA 2 DAY TAT     Status: None   Collection Time: 09/01/20  4:35 PM  Result Value Ref Range Status   SARS-CoV-2, NAA 2 DAY TAT Performed  Final  SARS Coronavirus 2 by RT PCR (hospital order, performed in [facility] lab) Nasopharyngeal Nasopharyngeal Swab     Status: None   Collection Time: 09/02/20 12:49 PM   Specimen: Nasopharyngeal Swab  Result Value Ref Range Status   SARS Coronavirus 2 NEGATIVE NEGATIVE Final    Comment: (NOTE) SARS-CoV-2 target nucleic acids are NOT DETECTED.  The SARS-CoV-2 RNA is generally detectable in upper and lower respiratory specimens during the acute phase of infection. The lowest concentration of SARS-CoV-2 viral copies this assay can detect is 250 copies / mL. A negative result does not preclude SARS-CoV-2 infection and should not be used as the sole basis for treatment or other patient management decisions.  A negative result may occur with improper  specimen collection / handling, submission of specimen other than nasopharyngeal swab, presence of viral mutation(s) within the areas targeted by this assay, and inadequate number of viral copies (<250 copies / mL). A negative result must be combined with clinical observations, patient history, and epidemiological information.  Fact Sheet for Patients:   BoilerBrush.com.cyhttps://www.fda.gov/media/136312/download  Fact Sheet for Healthcare Providers: https://pope.com/https://www.fda.gov/media/136313/download  This test is not yet approved or  cleared by the [place] FDA and has been authorized for detection and/or diagnosis of SARS-CoV-2 by FDA under an Emergency Use Authorization (EUA).  This EUA will remain in effect (meaning this test can be used) for the duration of the COVID-19 declaration under  Section 564(b)(1) of the Act, 21 U.S.C. section 360bbb-3(b)(1), unless the authorization is terminated or revoked sooner.  Performed at [facility], [address] [place], [place] [address]      Labs: BNP (last 3 results) No results for input(s): BNP in the last 8760 hours. Basic Metabolic Panel: Recent Labs  Lab 09/01/20 1607 09/02/20 0758 09/03/20 0357  NA 136 140 136  K 5.1 4.0 3.8  CL 103 107 108  CO2 20 24 22   GLUCOSE 100* 128* 97  BUN 37* 38* 22*  CREATININE 1.40* 1.17 1.02  CALCIUM 9.4 8.6* 8.0*   Liver Function Tests: Recent Labs  Lab 09/01/20 1607 09/02/20 0758  AST 13 15  ALT 15 15  ALKPHOS 66 47  BILITOT 0.2 0.7  PROT 6.7 6.3*  ALBUMIN 4.3 3.5   Recent Labs  Lab 09/02/20 2137  LIPASE 21   No results for input(s): AMMONIA in the last 168 hours. CBC: Recent Labs  Lab 09/01/20 1553 09/02/20 0758 09/02/20 2137 09/03/20 0059 09/03/20 0357 09/03/20 0830  WBC 10.3 8.3  --   --   --   --   NEUTROABS 7.1*  --   --   --   --   --   HGB 10.9* 8.9* 8.5* 7.9* 8.4* 7.8*  HCT 32.8* 27.2* 25.4* 23.6* 25.0* 23.2*  MCV 97 97.8  --   --   --   --   PLT 340 311  --   --    --   --    Cardiac Enzymes: No results for input(s): CKTOTAL, CKMB, CKMBINDEX, TROPONINI in the last 168 hours. BNP: Invalid input(s): POCBNP CBG: No results for input(s): GLUCAP in the last 168 hours. D-Dimer No results for input(s): DDIMER in the last 72 hours. Hgb A1c No results for input(s): HGBA1C in the last 72 hours. Lipid Profile No results for input(s): CHOL, HDL, LDLCALC, TRIG, CHOLHDL, LDLDIRECT in the last 72 hours. Thyroid function studies Recent Labs    09/01/20 1607  TSH 3.950   Anemia work up Recent Labs    09/01/20 1607 09/02/20 0758 09/02/20 2137  VITAMINB12  --   --  99*  FOLATE  --  8.1  --   FERRITIN 79  --   --   TIBC 297  --   --   IRON 78  --   --    Urinalysis    Component Value Date/Time   COLORURINE YELLOW (A) 07/04/2017 0043   APPEARANCEUR Clear 09/01/2020 1546   LABSPEC 1.006 07/04/2017 0043   LABSPEC 1.008 09/12/2011 1930   PHURINE 5.0 07/04/2017 0043   GLUCOSEU Negative 09/01/2020 1546   GLUCOSEU Negative 09/12/2011 1930   HGBUR NEGATIVE 07/04/2017 0043   BILIRUBINUR Negative 09/01/2020 1546   BILIRUBINUR Negative 09/12/2011 1930   KETONESUR NEGATIVE 07/04/2017 0043   PROTEINUR Negative 09/01/2020 1546   PROTEINUR NEGATIVE 07/04/2017 0043   NITRITE Negative 09/01/2020 1546   NITRITE NEGATIVE 07/04/2017 0043   LEUKOCYTESUR Trace (A) 09/01/2020 1546   LEUKOCYTESUR Negative 09/12/2011 1930   Sepsis Labs Invalid input(s): PROCALCITONIN,  WBC,  LACTICIDVEN Microbiology Recent Results (from the past 240 hour(s))  Microscopic Examination     Status: Abnormal   Collection Time: 09/01/20  3:46 PM   Urine  Result Value Ref Range Status   WBC, UA 6-10 (A) 0 - 5 /hpf Final   RBC 11-30 (A) 0 - 2 /hpf Final   Epithelial Cells (non renal) 0-10 0 - 10 /hpf Final  Bacteria, UA None seen None seen/Few Final  Novel Coronavirus, NAA ([facility])     Status: None   Collection Time: 09/01/20  4:35 PM   Specimen: Nasopharyngeal(NP) swabs in  vial transport medium  Result Value Ref Range Status   SARS-CoV-2, NAA Not Detected Not Detected Final    Comment: This nucleic acid amplification test was developed and its performance characteristics determined by [facility]. Nucleic acid amplification tests include RT-PCR and TMA. This test has not been FDA cleared or approved. This test has been authorized by FDA under an Emergency Use Authorization (EUA). This test is only authorized for the duration of time the declaration that circumstances exist justifying the authorization of the emergency use of in vitro diagnostic tests for detection of SARS-CoV-2 virus and/or diagnosis of COVID-19 infection under section 564(b)(1) of the Act, 21 U.S.C. 409WJX-9(J) (1), unless the authorization is terminated or revoked sooner. When diagnostic testing is negative, the possibility of a false negative result should be considered in the context of a patient's recent exposures and the presence of clinical signs and symptoms consistent with COVID-19. An individual without symptoms of COVID-19 and who is not shedding SARS-CoV-2 virus wo uld expect to have a negative (not detected) result in this assay.   SARS-COV-2, NAA 2 DAY TAT     Status: None   Collection Time: 09/01/20  4:35 PM  Result Value Ref Range Status   SARS-CoV-2, NAA 2 DAY TAT Performed  Final  SARS Coronavirus 2 by RT PCR (hospital order, performed in [facility] hospital lab) Nasopharyngeal Nasopharyngeal Swab     Status: None   Collection Time: 09/02/20 12:49 PM   Specimen: Nasopharyngeal Swab  Result Value Ref Range Status   SARS Coronavirus 2 NEGATIVE NEGATIVE Final    Comment: (NOTE) SARS-CoV-2 target nucleic acids are NOT DETECTED.  The SARS-CoV-2 RNA is generally detectable in upper and lower respiratory specimens during the acute phase of infection. The lowest concentration of SARS-CoV-2 viral copies this assay can detect is 250 copies / mL. A negative result  does not preclude SARS-CoV-2 infection and should not be used as the sole basis for treatment or other patient management decisions.  A negative result may occur with improper specimen collection / handling, submission of specimen other than nasopharyngeal swab, presence of viral mutation(s) within the areas targeted by this assay, and inadequate number of viral copies (<250 copies / mL). A negative result must be combined with clinical observations, patient history, and epidemiological information.  Fact Sheet for Patients:   BoilerBrush.com.cy  Fact Sheet for Healthcare Providers: https://pope.com/  This test is not yet approved or  cleared by the [place] FDA and has been authorized for detection and/or diagnosis of SARS-CoV-2 by FDA under an Emergency Use Authorization (EUA).  This EUA will remain in effect (meaning this test can be used) for the duration of the COVID-19 declaration under Section 564(b)(1) of the Act, 21 U.S.C. section 360bbb-3(b)(1), unless the authorization is terminated or revoked sooner.  Performed at [facility], [address] [place], [place] [address]      Time coordinating discharge: 25 minutes The [place] controlled substances registry was reviewed for this patient     SIGNED:   Alberteen Sam, MD  [facility] 09/03/2020, 6:53 PM

## 2020-09-03 NOTE — Anesthesia Postprocedure Evaluation (Signed)
Anesthesia Post Note  Patient: Mark [name]  Procedure(s) Performed: ESOPHAGOGASTRODUODENOSCOPY (EGD) WITH PROPOFOL (N/A )  Patient location during evaluation: Endoscopy Anesthesia Type: General Level of consciousness: awake and alert Pain management: pain level controlled Vital Signs Assessment: post-procedure vital signs reviewed and stable Respiratory status: spontaneous breathing, nonlabored ventilation, respiratory function stable and patient connected to nasal cannula oxygen Cardiovascular status: blood pressure returned to baseline and stable Postop Assessment: no apparent nausea or vomiting Anesthetic complications: no   No complications documented.   Last Vitals:  Vitals:   09/03/20 1131 09/03/20 1141  BP: (!) 135/92 128/87  Pulse: 95 94  Resp: 11 16  Temp:    SpO2: 97% 100%    Last Pain:  Vitals:   09/03/20 1141  TempSrc:   PainSc: 0-No pain                 Cleda Mccreedy [place] Fanfan

## 2020-09-03 NOTE — ED Notes (Signed)
Pt transported to ENDO via stretcher

## 2020-09-03 NOTE — Care Management CC44 (Signed)
Condition Code 44 Documentation Completed  Patient Details  Name: Mark [name] MRN: [id number] Date of Birth: [birth year]   Condition Code 44 given:  Yes Patient signature on Condition Code 44 notice:  Yes Documentation of 2 MD's agreement:  Yes Code 44 added to claim:  Yes  Reviewed with patient and daughter in law, Mark [name].  Pope Brunty E Debarah Mccumbers, LCSW 09/03/2020, 1:49 PM

## 2020-09-03 NOTE — Progress Notes (Signed)
Egd images 09/03/20 Healing gastric prepyloric ulcer  Medium sized hiatal hernia

## 2020-09-03 NOTE — Op Note (Signed)
[facility] Gastroenterology Patient Name: Mark [name] Procedure Date: 09/03/2020 10:14 AM MRN: [id number] Account #: [id number] Date of Birth: March 27, 1961 Admit Type: Outpatient Age: 60 Room: [facility] ENDO ROOM 1 Gender: Male Note Status: Finalized Procedure:             Upper GI endoscopy Indications:           Epigastric abdominal pain, Iron deficiency anemia                         secondary to chronic blood loss, Hematemesis, Melena Providers:             Toney Reil MD, MD Medicines:             General Anesthesia Complications:         No immediate complications. Estimated blood loss: None. Procedure:             Pre-Anesthesia Assessment:                        - Prior to the procedure, a History and Physical was                         performed, and patient medications and allergies were                         reviewed. The patient is competent. The risks and                         benefits of the procedure and the sedation options and                         risks were discussed with the patient. All questions                         were answered and informed consent was obtained.                         Patient identification and proposed procedure were                         verified by the physician, the nurse, the                         anesthesiologist, the anesthetist and the technician                         in the pre-procedure area in the procedure room in the                         endoscopy suite. Mental Status Examination: alert and                         oriented. Airway Examination: normal oropharyngeal                         airway and neck mobility. Respiratory Examination:                         clear to auscultation.  CV Examination: normal.                         Prophylactic Antibiotics: The patient does not require                         prophylactic antibiotics. Prior Anticoagulants: The                          patient has taken no previous anticoagulant or                         antiplatelet agents except for aspirin. ASA Grade                         Assessment: III - A patient with severe systemic                         disease. After reviewing the risks and benefits, the                         patient was deemed in satisfactory condition to                         undergo the procedure. The anesthesia plan was to use                         general anesthesia. Immediately prior to                         administration of medications, the patient was                         re-assessed for adequacy to receive sedatives. The                         heart rate, respiratory rate, oxygen saturations,                         blood pressure, adequacy of pulmonary ventilation, and                         response to care were monitored throughout the                         procedure. The physical status of the patient was                         re-assessed after the procedure.                        After obtaining informed consent, the endoscope was                         passed under direct vision. Throughout the procedure,                         the patient's blood pressure, pulse, and oxygen  saturations were monitored continuously. The Endoscope                         was introduced through the mouth, and advanced to the                         second part of duodenum. The upper GI endoscopy was                         accomplished without difficulty. The patient tolerated                         the procedure fairly well. Findings:      Images are saved into patient progress notes, provation didn't capture       images due to program malfunction      The duodenal bulb and second portion of the duodenum were normal.      One non-bleeding superficial gastric ulcer with a clean ulcer base       (Forrest Class III) was found in the prepyloric region of the stomach.        The lesion was 3 mm in largest dimension.      A medium-sized hiatal hernia was present.      The cardia and gastric fundus were normal on retroflexion.      The gastroesophageal junction and examined esophagus were normal. Impression:            - Normal duodenal bulb and second portion of the                         duodenum.                        - Non-bleeding gastric ulcer with a clean ulcer base                         (Forrest Class III).                        - Medium-sized hiatal hernia.                        - Normal gastroesophageal junction and esophagus.                        - No specimens collected. Recommendation:        - Return patient to hospital ward for ongoing care.                        - Continue present medications.                        - Perform an H. pylori serology today.                        - Low sodium diet today.                        - Perform a colonoscopy at appointment to be scheduled  as outpt.                        - Protonix 40mg  BID for 74month                        - Ok to take aspirin 81mg  daily Procedure Code(s):     --- Professional ---                        [phone], Esophagogastroduodenoscopy, flexible,                         transoral; diagnostic, including collection of                         specimen(s) by brushing or washing, when performed                         (separate procedure) Diagnosis Code(s):     --- Professional ---                        K25.9, Gastric ulcer, unspecified as acute or chronic,                         without hemorrhage or perforation                        K44.9, Diaphragmatic hernia without obstruction or                         gangrene                        R10.13, Epigastric pain                        D50.0, Iron deficiency anemia secondary to blood loss                         (chronic)                        K92.0, Hematemesis                        K92.1,  Melena (includes Hematochezia) CPT copyright 2019 American Medical Association. All rights reserved. The codes documented in this report are preliminary and upon coder review may  be revised to meet current compliance requirements. Dr. [id number] MD, MD 09/03/2020 11:19:49 AM This report has been signed electronically. Number of Addenda: 0 Note Initiated On: 09/03/2020 10:14 AM Estimated Blood Loss:  Estimated blood loss: none.      [facility]

## 2020-09-03 NOTE — Anesthesia Preprocedure Evaluation (Signed)
Anesthesia Evaluation  Patient identified by MRN, date of birth, ID band Patient awake    Reviewed: Allergy & Precautions, H&P , NPO status , Patient's Chart, lab work & pertinent test results  History of Anesthesia Complications Negative for: history of anesthetic complications  Airway Mallampati: III  TM Distance: >3 FB Neck ROM: limited    Dental  (+) Chipped, Poor Dentition, Implants, Missing   Pulmonary neg shortness of breath, COPD, Current Smoker and Patient abstained from smoking.,    Pulmonary exam normal        Cardiovascular Exercise Tolerance: Good hypertension, (-) anginaNormal cardiovascular exam     Neuro/Psych PSYCHIATRIC DISORDERS CVA (left side), Residual Symptoms    GI/Hepatic negative GI ROS, Neg liver ROS,   Endo/Other  negative endocrine ROS  Renal/GU negative Renal ROS  negative genitourinary   Musculoskeletal  (+) Arthritis ,   Abdominal   Peds  Hematology  (+) Blood dyscrasia, anemia ,   Anesthesia Other Findings Patient is NPO appropriate and reports no nausea or vomiting today.  Past Medical History: No date: History of kidney stones No date: Hypertension No date: Stroke [facility])  Past Surgical History: No date: APPENDECTOMY No date: bowel obstruction 05/15/2019: EXTRACORPOREAL SHOCK WAVE LITHOTRIPSY; Right     Comment:  Procedure: EXTRACORPOREAL SHOCK WAVE LITHOTRIPSY (ESWL);              Surgeon: Malen Gauze, MD;  Location: [facility] ORS;                Service: Urology;  Laterality: Right;  BMI    Body Mass Index: 22.43 kg/m      Reproductive/Obstetrics negative OB ROS                             Anesthesia Physical Anesthesia Plan  ASA: IV  Anesthesia Plan: General   Post-op Pain Management:    Induction: Intravenous  PONV Risk Score and Plan: Propofol infusion and TIVA  Airway Management Planned: Natural Airway and Nasal  Cannula  Additional Equipment:   Intra-op Plan:   Post-operative Plan:   Informed Consent: I have reviewed the patients History and Physical, chart, labs and discussed the procedure including the risks, benefits and alternatives for the proposed anesthesia with the patient or authorized representative who has indicated his/her understanding and acceptance.     Dental Advisory Given  Plan Discussed with: Anesthesiologist, CRNA and Surgeon  Anesthesia Plan Comments: (Patient consented for risks of anesthesia including but not limited to:  - adverse reactions to medications - risk of airway placement if required - damage to eyes, teeth, lips or other oral mucosa - nerve damage due to positioning  - sore throat or hoarseness - Damage to heart, brain, nerves, lungs, other parts of body or loss of life  Patient voiced understanding.)        Anesthesia Quick Evaluation

## 2020-09-06 ENCOUNTER — Telehealth: Payer: Self-pay

## 2020-09-06 ENCOUNTER — Inpatient Hospital Stay
Admission: RE | Admit: 2020-09-06 | Discharge: 2020-09-09 | DRG: 811 | Disposition: A | Discharge status: Home / Self Care | Payer: Medicare HMO | Attending: Internal Medicine | Admitting: Internal Medicine

## 2020-09-06 ENCOUNTER — Encounter: Payer: Self-pay | Admitting: Gastroenterology

## 2020-09-06 ENCOUNTER — Ambulatory Visit: Payer: Medicare HMO | Admitting: Anesthesiology

## 2020-09-06 ENCOUNTER — Encounter
Admission: RE | Disposition: A | Discharge status: Home / Self Care | Payer: Self-pay | Source: Home / Self Care | Attending: Internal Medicine

## 2020-09-06 ENCOUNTER — Other Ambulatory Visit: Payer: Self-pay

## 2020-09-06 ENCOUNTER — Ambulatory Visit: Payer: Medicare HMO | Admitting: Nurse Practitioner

## 2020-09-06 DIAGNOSIS — Z7982 Long term (current) use of aspirin: Secondary | ICD-10-CM

## 2020-09-06 DIAGNOSIS — K254 Chronic or unspecified gastric ulcer with hemorrhage: Secondary | ICD-10-CM | POA: Diagnosis present

## 2020-09-06 DIAGNOSIS — F419 Anxiety disorder, unspecified: Secondary | ICD-10-CM | POA: Diagnosis not present

## 2020-09-06 DIAGNOSIS — Z20822 Contact with and (suspected) exposure to covid-19: Secondary | ICD-10-CM | POA: Diagnosis present

## 2020-09-06 DIAGNOSIS — E785 Hyperlipidemia, unspecified: Secondary | ICD-10-CM | POA: Diagnosis not present

## 2020-09-06 DIAGNOSIS — Z8711 Personal history of peptic ulcer disease: Secondary | ICD-10-CM | POA: Diagnosis not present

## 2020-09-06 DIAGNOSIS — K921 Melena: Secondary | ICD-10-CM

## 2020-09-06 DIAGNOSIS — F17210 Nicotine dependence, cigarettes, uncomplicated: Secondary | ICD-10-CM | POA: Diagnosis present

## 2020-09-06 DIAGNOSIS — K635 Polyp of colon: Secondary | ICD-10-CM | POA: Diagnosis present

## 2020-09-06 DIAGNOSIS — K2971 Gastritis, unspecified, with bleeding: Secondary | ICD-10-CM | POA: Diagnosis present

## 2020-09-06 DIAGNOSIS — Z79899 Other long term (current) drug therapy: Secondary | ICD-10-CM

## 2020-09-06 DIAGNOSIS — Z980 Intestinal bypass and anastomosis status: Secondary | ICD-10-CM | POA: Diagnosis not present

## 2020-09-06 DIAGNOSIS — F418 Other specified anxiety disorders: Secondary | ICD-10-CM | POA: Diagnosis not present

## 2020-09-06 DIAGNOSIS — K2980 Duodenitis without bleeding: Secondary | ICD-10-CM | POA: Diagnosis present

## 2020-09-06 DIAGNOSIS — T39395A Adverse effect of other nonsteroidal anti-inflammatory drugs [NSAID], initial encounter: Secondary | ICD-10-CM | POA: Diagnosis present

## 2020-09-06 DIAGNOSIS — D62 Acute posthemorrhagic anemia: Secondary | ICD-10-CM | POA: Diagnosis present

## 2020-09-06 DIAGNOSIS — Z8673 Personal history of transient ischemic attack (TIA), and cerebral infarction without residual deficits: Secondary | ICD-10-CM | POA: Diagnosis not present

## 2020-09-06 DIAGNOSIS — K920 Hematemesis: Secondary | ICD-10-CM | POA: Diagnosis not present

## 2020-09-06 DIAGNOSIS — K3182 Dieulafoy lesion (hemorrhagic) of stomach and duodenum: Secondary | ICD-10-CM | POA: Diagnosis not present

## 2020-09-06 DIAGNOSIS — Z885 Allergy status to narcotic agent status: Secondary | ICD-10-CM | POA: Diagnosis not present

## 2020-09-06 DIAGNOSIS — M1990 Unspecified osteoarthritis, unspecified site: Secondary | ICD-10-CM | POA: Diagnosis not present

## 2020-09-06 DIAGNOSIS — I10 Essential (primary) hypertension: Secondary | ICD-10-CM | POA: Diagnosis present

## 2020-09-06 DIAGNOSIS — K259 Gastric ulcer, unspecified as acute or chronic, without hemorrhage or perforation: Secondary | ICD-10-CM | POA: Diagnosis not present

## 2020-09-06 DIAGNOSIS — J438 Other emphysema: Secondary | ICD-10-CM | POA: Diagnosis not present

## 2020-09-06 DIAGNOSIS — Z8249 Family history of ischemic heart disease and other diseases of the circulatory system: Secondary | ICD-10-CM | POA: Diagnosis not present

## 2020-09-06 DIAGNOSIS — K449 Diaphragmatic hernia without obstruction or gangrene: Secondary | ICD-10-CM | POA: Diagnosis present

## 2020-09-06 DIAGNOSIS — D649 Anemia, unspecified: Secondary | ICD-10-CM | POA: Diagnosis not present

## 2020-09-06 DIAGNOSIS — F32A Depression, unspecified: Secondary | ICD-10-CM | POA: Diagnosis not present

## 2020-09-06 DIAGNOSIS — K625 Hemorrhage of anus and rectum: Secondary | ICD-10-CM | POA: Diagnosis not present

## 2020-09-06 DIAGNOSIS — K922 Gastrointestinal hemorrhage, unspecified: Secondary | ICD-10-CM | POA: Diagnosis not present

## 2020-09-06 LAB — MAGNESIUM: Magnesium: 2 mg/dL (ref 1.7–2.4)

## 2020-09-06 LAB — BASIC METABOLIC PANEL
Anion gap: 10 (ref 5–15)
BUN: 18 mg/dL (ref 6–20)
CO2: 23 mmol/L (ref 22–32)
Calcium: 7.9 mg/dL — ABNORMAL LOW (ref 8.9–10.3)
Chloride: 106 mmol/L (ref 98–111)
Creatinine, Ser: 1.04 mg/dL (ref 0.61–1.24)
GFR, Estimated: >60 mL/min (ref >60)
Glucose, Bld: 90 mg/dL (ref 70–99)
Potassium: 3.2 mmol/L — ABNORMAL LOW (ref 3.5–5.1)
Sodium: 139 mmol/L (ref 135–145)

## 2020-09-06 LAB — CBC
HCT: 17.7 % — ABNORMAL LOW (ref 39.0–52.0)
Hemoglobin: 6 g/dL — ABNORMAL LOW (ref 13.0–17.0)
MCH: 31.9 pg (ref 26.0–34.0)
MCHC: 33.9 g/dL (ref 30.0–36.0)
MCV: 94.1 fL (ref 80.0–100.0)
Platelets: 166 10*3/uL (ref 150–400)
RBC: 1.88 MIL/uL — ABNORMAL LOW (ref 4.22–5.81)
RDW: 15.8 % — ABNORMAL HIGH (ref 11.5–15.5)
WBC: 12.2 10*3/uL — ABNORMAL HIGH (ref 4.0–10.5)
nRBC: 0.4 % — ABNORMAL HIGH (ref 0.0–0.2)

## 2020-09-06 LAB — PREPARE RBC (CROSSMATCH)

## 2020-09-06 LAB — PHOSPHORUS: Phosphorus: 2.6 mg/dL (ref 2.5–4.6)

## 2020-09-06 LAB — HEMOGLOBIN AND HEMATOCRIT, BLOOD
HCT: 23.2 % — ABNORMAL LOW (ref 39.0–52.0)
Hemoglobin: 7.7 g/dL — ABNORMAL LOW (ref 13.0–17.0)

## 2020-09-06 SURGERY — COLONOSCOPY WITH PROPOFOL
Anesthesia: General

## 2020-09-06 MED ORDER — TRAMADOL HCL 50 MG PO TABS
ORAL_TABLET | ORAL | Status: AC
  Administered 2020-09-06: 50 mg via ORAL
  Filled 2020-09-06: qty 1

## 2020-09-06 MED ORDER — MORPHINE SULFATE (PF) 2 MG/ML IV SOLN
INTRAVENOUS | Status: AC
  Administered 2020-09-06: 2 mg via INTRAVENOUS
  Filled 2020-09-06: qty 1

## 2020-09-06 MED ORDER — PANTOPRAZOLE SODIUM 40 MG IV SOLR
40.0000 mg | Freq: Two times a day (BID) | INTRAVENOUS | Status: DC
  Filled 2020-09-06: qty 40

## 2020-09-06 MED ORDER — ONDANSETRON HCL 4 MG/2ML IJ SOLN
4.0000 mg | Freq: Four times a day (QID) | INTRAMUSCULAR | Status: DC | PRN
  Administered 2020-09-07: 4 mg via INTRAVENOUS
  Filled 2020-09-06: qty 2

## 2020-09-06 MED ORDER — MORPHINE SULFATE (PF) 2 MG/ML IV SOLN
2.0000 mg | INTRAVENOUS | Status: DC | PRN
  Administered 2020-09-06 – 2020-09-09 (×14): 2 mg via INTRAVENOUS
  Filled 2020-09-06 (×14): qty 1

## 2020-09-06 MED ORDER — CITALOPRAM HYDROBROMIDE 20 MG PO TABS: 20.0000 mg | ORAL_TABLET | Freq: Every day | ORAL | Status: DC

## 2020-09-06 MED ORDER — PROPOFOL 500 MG/50ML IV EMUL
INTRAVENOUS | Status: AC
  Filled 2020-09-06: qty 50

## 2020-09-06 MED ORDER — PANTOPRAZOLE SODIUM 40 MG IV SOLR
40.0000 mg | Freq: Two times a day (BID) | INTRAVENOUS | Status: DC
  Administered 2020-09-06 – 2020-09-09 (×6): 40 mg via INTRAVENOUS
  Filled 2020-09-06 (×7): qty 40

## 2020-09-06 MED ORDER — CYCLOBENZAPRINE HCL 10 MG PO TABS: 10.0000 mg | ORAL_TABLET | Freq: Three times a day (TID) | ORAL | Status: DC | PRN

## 2020-09-06 MED ORDER — PRAVASTATIN SODIUM 20 MG PO TABS: 20.0000 mg | ORAL_TABLET | Freq: Every day | ORAL | Status: DC

## 2020-09-06 MED ORDER — TAMSULOSIN HCL 0.4 MG PO CAPS: 0.4000 mg | ORAL_CAPSULE | Freq: Every day | ORAL | Status: DC

## 2020-09-06 MED ORDER — FENTANYL CITRATE (PF) 100 MCG/2ML IJ SOLN
INTRAMUSCULAR | Status: AC
  Filled 2020-09-06: qty 2

## 2020-09-06 MED ORDER — TRAMADOL HCL 50 MG PO TABS: 50.0000 mg | ORAL_TABLET | Freq: Once | ORAL | Status: AC

## 2020-09-06 MED ORDER — ONDANSETRON HCL 4 MG PO TABS: 4.0000 mg | ORAL_TABLET | Freq: Four times a day (QID) | ORAL | Status: DC | PRN

## 2020-09-06 MED ORDER — SODIUM CHLORIDE 0.9% IV SOLUTION: Freq: Once | INTRAVENOUS | Status: AC

## 2020-09-06 MED ORDER — MORPHINE SULFATE (PF) 2 MG/ML IV SOLN
INTRAVENOUS | Status: AC
  Filled 2020-09-06: qty 1

## 2020-09-06 MED ORDER — LIDOCAINE HCL (PF) 1 % IJ SOLN
INTRAMUSCULAR | Status: AC
  Filled 2020-09-06: qty 2

## 2020-09-06 MED ORDER — FENTANYL CITRATE (PF) 100 MCG/2ML IJ SOLN
25.0000 ug | Freq: Once | INTRAMUSCULAR | Status: AC
  Administered 2020-09-06: 25 ug via INTRAVENOUS

## 2020-09-06 MED ORDER — SODIUM CHLORIDE 0.9 % IV SOLN: INTRAVENOUS | Status: DC

## 2020-09-06 NOTE — H&P (Signed)
Arlyss Repress, MD [address]  [address]  [place], [place] [address]  Main: [phone]  Fax: [phone] Pager: [phone]  Primary Care Physician:  Marjie Skiff, NP Primary Gastroenterologist:  Dr. Arlyss Repress  Pre-Procedure History & Physical: HPI:  Mark [name] is a 60 y.o. male is here for an colonoscopy.   Past Medical History:  Diagnosis Date  . History of kidney stones   . Hypertension   . Stroke [facility])     Past Surgical History:  Procedure Laterality Date  . APPENDECTOMY    . bowel obstruction    . EXTRACORPOREAL SHOCK WAVE LITHOTRIPSY Right 05/15/2019   Procedure: EXTRACORPOREAL SHOCK WAVE LITHOTRIPSY (ESWL);  Surgeon: Malen Gauze, MD;  Location: [facility] ORS;  Service: Urology;  Laterality: Right;    Prior to Admission medications   Medication Sig Start Date End Date Taking? Authorizing Provider  amLODipine (NORVASC) 10 MG tablet TAKE 1 TABLET BY MOUTH EVERY DAY Patient taking differently: Take 10 mg by mouth daily. 09/01/20   Aura Dials T, NP  aspirin EC 81 MG tablet Take 1 tablet (81 mg total) by mouth daily. Swallow whole. 09/03/20 09/03/21  Danford, Earl Lites, MD  citalopram (CELEXA) 20 MG tablet TAKE 1 TABLET BY MOUTH EVERY DAY Patient taking differently: Take 20 mg by mouth daily. 07/12/20   Cannady, Corrie Dandy T, NP  cyclobenzaprine (FLEXERIL) 10 MG tablet TAKE 1 TABLET BY MOUTH THREE TIMES A DAY AS NEEDED FOR MUSCLE SPASMS Patient taking differently: Take 10 mg by mouth 3 (three) times daily as needed for muscle spasms. 02/17/20   Cannady, Corrie Dandy T, NP  metoprolol tartrate (LOPRESSOR) 25 MG tablet TAKE 1 TABLET BY MOUTH TWICE A DAY Patient taking differently: Take 25 mg by mouth 2 (two) times daily. 07/12/20   Cannady, Corrie Dandy T, NP  omeprazole (PRILOSEC) 20 MG capsule Take 1 capsule (20 mg total) by mouth daily. 09/01/20   Cannady, Corrie Dandy T, NP  ondansetron (ZOFRAN) 4 MG tablet Take 1 tablet (4 mg total) by mouth every 8 (eight)  hours as needed for nausea or vomiting. 09/03/20   Chuck Caban, Loel Dubonnet, MD  pravastatin (PRAVACHOL) 20 MG tablet TAKE 1 TABLET BY MOUTH EVERY DAY Patient taking differently: Take 20 mg by mouth daily. 09/16/19   Cannady, Corrie Dandy T, NP  sucralfate (CARAFATE) 1 g tablet Take 1 tablet (1 g total) by mouth 4 (four) times daily -  with meals and at bedtime. 09/01/20   Cannady, Corrie Dandy T, NP  tamsulosin (FLOMAX) 0.4 MG CAPS capsule TAKE 1 CAPSULE BY MOUTH EVERY DAY Patient taking differently: Take 0.4 mg by mouth daily. 08/13/20   Aura Dials T, NP    Allergies as of 09/03/2020 - Review Complete 09/03/2020  Allergen Reaction Noted  . Codeine Hives 05/03/2015    Family History  Problem Relation Age of Onset  . Heart disease Father   . Seizures Brother     Social History   Socioeconomic History  . Marital status: Divorced    Spouse name: Not on file  . Number of children: Not on file  . Years of education: Not on file  . Highest education level: Not on file  Occupational History  . Occupation: retired  Tobacco Use  . Smoking status: Current Every Day Smoker    Packs/day: 1.00    Years: 43.00    Pack years: 43.00  . Smokeless tobacco: Never Used  Vaping Use  . Vaping Use: Never used  Substance and Sexual  Activity  . Alcohol use: Not Currently    Alcohol/week: 0.0 standard drinks  . Drug use: Not Currently  . Sexual activity: Not on file  Other Topics Concern  . Not on file  Social History Narrative  . Not on file   Social Determinants of Health   Financial Resource Strain: Low Risk   . Difficulty of Paying Living Expenses: Not hard at all  Food Insecurity: No Food Insecurity  . Worried About Programme researcher, broadcasting/film/video in the Last Year: Never true  . Ran Out of Food in the Last Year: Never true  Transportation Needs: No Transportation Needs  . Lack of Transportation (Medical): No  . Lack of Transportation (Non-Medical): No  Physical Activity: Sufficiently Active  . Days of  Exercise per Week: 7 days  . Minutes of Exercise per Session: 60 min  Stress: No Stress Concern Present  . Feeling of Stress : Not at all  Social Connections: Not on file  Intimate Partner Violence: Not on file    Review of Systems: See HPI, otherwise negative ROS  Physical Exam: BP (!) 127/91   Pulse 90   Temp 98.2 F (36.8 C) (Temporal)   Resp 18   SpO2 100%  General:   Alert,  pleasant and cooperative in NAD Head:  Normocephalic and atraumatic. Neck:  Supple; no masses or thyromegaly. Lungs:  Clear throughout to auscultation.    Heart:  Regular rate and rhythm. Abdomen:  Soft, nontender and nondistended. Normal bowel sounds, without guarding, and without rebound.   Neurologic:  Alert and  oriented x4;  grossly normal neurologically.  Impression/Plan: Mark [name] is here for an colonoscopy to be performed for acute blood loss anemia  Risks, benefits, limitations, and alternatives regarding  colonoscopy have been reviewed with the patient.  Questions have been answered.  All parties agreeable.   Lannette Donath, MD  09/06/2020, 10:09 AM

## 2020-09-06 NOTE — Telephone Encounter (Signed)
lvm to make this apt.  

## 2020-09-06 NOTE — Consult Note (Signed)
Mark Darby, MD [address]  [place]  [place], [place] [address]  Main: [phone]  Fax: [phone] Pager: [phone]   Consultation  Referring Provider:     No ref. provider found Primary Care Physician:  Venita Lick, NP Primary Gastroenterologist:  Dr. Sherri Sear         Reason for Consultation:     Melena, symptomatic anemia  Date of Admission:  09/06/2020 Date of Consultation:  09/06/2020         HPI:   WILMAN TUCKER is a 60 y.o. male with prior history of stroke, hypertension who was initially admitted to [facility] on 1/20 secondary to symptomatic anemia and black tarry stool as well as hematemesis.  He was adequately resuscitated at that time.  He was taking aspirin 325 mg daily for a long time.  CT abdomen and pelvis did not reveal any acute intra-abdominal pathology.  He subsequently underwent upper endoscopy on 1/21 which did not reveal any active bleeding source other than medium size hiatal hernia and a small clean-based gastric ulcer.  Therefore, he was discharged home with plan to perform outpatient colonoscopy today.  His hemoglobin on 1/21 was 7.8.  However, in preop assessment today, patient reported ongoing melena, and generalized weakness.  Therefore, I ordered stat CBC which revealed hemoglobin of 6.  Therefore, colonoscopy was canceled with the plan to admit the patient.  Apparently, patient said he managed to drink three fourth of his prep.  He reports lower abdominal pain.  He denies nausea, vomiting, chest pain or shortness of breath.   NSAIDs: Aspirin 325 mg daily  Antiplts/Anticoagulants/Anti thrombotics: Aspirin 325 mg daily  GI Procedures:  EGD 09/03/2020 - Normal duodenal bulb and second portion of the duodenum. - Non-bleeding gastric ulcer with a clean ulcer base (Forrest Class III). - Medium-sized hiatal hernia. - Normal gastroesophageal junction and esophagus. - No specimens collected.  Past Medical History:  Diagnosis Date   . History of kidney stones   . Hypertension   . Stroke [facility])     Past Surgical History:  Procedure Laterality Date  . APPENDECTOMY    . bowel obstruction    . EXTRACORPOREAL SHOCK WAVE LITHOTRIPSY Right 05/15/2019   Procedure: EXTRACORPOREAL SHOCK WAVE LITHOTRIPSY (ESWL);  Surgeon: Cleon Gustin, MD;  Location: [facility]S;  Service: Urology;  Laterality: Right;    Prior to Admission medications   Medication Sig Start Date End Date Taking? Authorizing Provider  amLODipine (NORVASC) 10 MG tablet TAKE 1 TABLET BY MOUTH EVERY DAY Patient taking differently: Take 10 mg by mouth daily. 09/01/20   Marnee Guarneri T, NP  aspirin EC 81 MG tablet Take 1 tablet (81 mg total) by mouth daily. Swallow whole. 09/03/20 09/03/21  Danford, Suann Larry, MD  citalopram (CELEXA) 20 MG tablet TAKE 1 TABLET BY MOUTH EVERY DAY Patient taking differently: Take 20 mg by mouth daily. 07/12/20   Cannady, Henrine Screws T, NP  cyclobenzaprine (FLEXERIL) 10 MG tablet TAKE 1 TABLET BY MOUTH THREE TIMES A DAY AS NEEDED FOR MUSCLE SPASMS Patient taking differently: Take 10 mg by mouth 3 (three) times daily as needed for muscle spasms. 02/17/20   Cannady, Henrine Screws T, NP  metoprolol tartrate (LOPRESSOR) 25 MG tablet TAKE 1 TABLET BY MOUTH TWICE A DAY Patient taking differently: Take 25 mg by mouth 2 (two) times daily. 07/12/20   Cannady, Henrine Screws T, NP  omeprazole (PRILOSEC) 20 MG capsule Take 1 capsule (20 mg total) by mouth daily.  09/01/20   Cannady, Henrine Screws T, NP  ondansetron (ZOFRAN) 4 MG tablet Take 1 tablet (4 mg total) by mouth every 8 (eight) hours as needed for nausea or vomiting. 09/03/20   Willard Farquharson, Tally Due, MD  pravastatin (PRAVACHOL) 20 MG tablet TAKE 1 TABLET BY MOUTH EVERY DAY Patient taking differently: Take 20 mg by mouth daily. 09/16/19   Cannady, Henrine Screws T, NP  sucralfate (CARAFATE) 1 g tablet Take 1 tablet (1 g total) by mouth 4 (four) times daily -  with meals and at bedtime. 09/01/20   Cannady, Henrine Screws T, NP   tamsulosin (FLOMAX) 0.4 MG CAPS capsule TAKE 1 CAPSULE BY MOUTH EVERY DAY Patient taking differently: Take 0.4 mg by mouth daily. 08/13/20   Venita Lick, NP    Current Facility-Administered Medications:  .  0.9 %  sodium chloride infusion, , Intravenous, Continuous, Tysen Roesler, Tally Due, MD, Last Rate: 20 mL/hr at 09/06/20 1046, Continued from Pre-op at 09/06/20 1046 .  traMADol (ULTRAM) tablet 50 mg, 50 mg, Oral, Once, Ornella Coderre, Tally Due, MD  Family History  Problem Relation Age of Onset  . Heart disease Father   . Seizures Brother      Social History   Tobacco Use  . Smoking status: Current Every Day Smoker    Packs/day: 1.00    Years: 43.00    Pack years: 43.00  . Smokeless tobacco: Never Used  Vaping Use  . Vaping Use: Never used  Substance Use Topics  . Alcohol use: Not Currently    Alcohol/week: 0.0 standard drinks  . Drug use: Not Currently    Allergies as of 09/03/2020 - Review Complete 09/03/2020  Allergen Reaction Noted  . Codeine Hives 05/03/2015    Review of Systems:    All systems reviewed and negative except where noted in HPI.   Physical Exam:  Vital signs in last 24 hours: Temp:  [98.2 F (36.8 C)] 98.2 F (36.8 C) (01/24 0929) Pulse Rate:  [90] 90 (01/24 0929) Resp:  [18] 18 (01/24 0929) BP: (127)/(91) 127/91 (01/24 0929) SpO2:  [100 %] 100 % (01/24 0929)   General:   Pleasant, cooperative in NAD Head:  Normocephalic and atraumatic. Eyes:   No icterus.   Conjunctiva pale. PERRLA. Ears:  Normal auditory acuity. Neck:  Supple; no masses or thyroidomegaly Lungs: Respirations even and unlabored. Lungs clear to auscultation bilaterally.   No wheezes, crackles, or rhonchi.  Heart:  Regular rate and rhythm;  Without murmur, clicks, rubs or gallops Abdomen:  Soft, nondistended, mild lower abdominal tenderness, normal bowel sounds. No appreciable masses or hepatomegaly.  No rebound or guarding.  Rectal:  Not performed. Msk:  Symmetrical without  gross deformities.  Strength generalized weakness Extremities:  Without edema, cyanosis or clubbing. Neurologic:  Alert and oriented x3;  grossly normal neurologically. Skin:  Intact without significant lesions or rashes. Cervical Nodes:  No significant cervical adenopathy. Psych:  Alert and cooperative. Normal affect.  LAB RESULTS: CBC Latest Ref Rng & Units 09/06/2020 09/03/2020 09/03/2020  WBC 4.0 - 10.5 K/uL 12.2(H) - -  Hemoglobin 13.0 - 17.0 g/dL 6.0(L) 7.8(L) 8.4(L)  Hematocrit 39.0 - 52.0 % 17.7(L) 23.2(L) 25.0(L)  Platelets 150 - 400 K/uL 166 - -    BMET BMP Latest Ref Rng & Units 09/03/2020 09/02/2020 09/01/2020  Glucose 70 - 99 mg/dL 97 128(H) 100(H)  BUN 6 - 20 mg/dL 22(H) 38(H) 37(H)  Creatinine 0.61 - 1.24 mg/dL 1.02 1.17 1.40(H)  BUN/Creat Ratio 10 - 24 - - 26(H)  Sodium 135 - 145 mmol/L 136 140 136  Potassium 3.5 - 5.1 mmol/L 3.8 4.0 5.1  Chloride 98 - 111 mmol/L 108 107 103  CO2 22 - 32 mmol/L 22 24 20   Calcium 8.9 - 10.3 mg/dL 8.0(L) 8.6(L) 9.4    LFT Hepatic Function Latest Ref Rng & Units 09/02/2020 09/01/2020 07/30/2020  Total Protein 6.5 - 8.1 g/dL 6.3(L) 6.7 7.4  Albumin 3.5 - 5.0 g/dL 3.5 4.3 4.6  AST 15 - 41 U/L 15 13 31   ALT 0 - 44 U/L 15 15 47(H)  Alk Phosphatase 38 - 126 U/L 47 66 113  Total Bilirubin 0.3 - 1.2 mg/dL 0.7 0.2 0.4  Bilirubin, Direct 0.00 - 0.40 mg/dL - - 0.12     STUDIES: No results found.    Impression / Plan:   PERRIN GENS is a 60 y.o. male with history of tobacco use, prior history of stroke, chronic NSAID use who was recently admitted with melena and acute symptomatic anemia, underwent EGD on 1/21 which revealed clean-based gastric ulcer, medium size hiatal hernia.  Patient presented for outpatient colonoscopy and was found to be pale with generalized weakness, hemoglobin was 6.  Therefore colonoscopy was canceled and patient will be admitted for further management.  Patient is hemodynamically stable  History of melena with  symptomatic anemia: Patient is hemodynamically stable EGD revealed small clean-based ulcer and medium size hiatal hernia only.  Given ongoing melena and severity of anemia, recommend further evaluation Recommend nuclear medicine bleeding scan, if this is negative, recommend colonoscopy +/- VCE for further evaluation He may also have ischemic colitis with history of tobacco use causing melena Continue PPI p.o. twice daily N.p.o except ice chips for now Recommend 2 units of PRBCs Communicated with admitting hospitalist as well as administrative coordinator  Thank you for involving me in the care of this patient.  Dr. Bonna Gains will cover from tomorrow    LOS: 0 days   Sherri Sear, MD  09/06/2020, 12:24 PM   Note: This dictation was prepared with Dragon dictation along with smaller phrase technology. Any transcriptional errors that result from this process are unintentional.

## 2020-09-06 NOTE — Plan of Care (Signed)

## 2020-09-06 NOTE — Telephone Encounter (Signed)
-----   Message from Marjie Skiff, NP sent at 09/03/2020  8:34 PM EST ----- Will need hospital follow-up 1-2 weeks -- okay to see another provider if needed and he is okay with this OR can schedule in 20 minute slot on 09/15/20 if wishes to see me only.  Thanks

## 2020-09-06 NOTE — H&P (Addendum)
History and Physical    Mark [name] HYQ:[id number] DOB: 11/01/1960 DOA: 09/06/2020  PCP: Marjie Skiffannady, Jolene T, NP   Patient coming from: Home  I have personally briefly reviewed [facility]  Chief Complaint: Generalized weakness                                Melena stools  HPI: Mark BandaRodney D Baumgarner is a 60 y.o. male with medical history significant for  hypertension, history of CVA, s/p multiple surgeries for SBO, nicotine dependence who presented for an outpatient colonoscopy and complained of generalized weakness and fatigue.  Patient appeared very pale and a CBC was done and his hemoglobin returned at 6.0g/dl. His procedure was canceled and request was made for admission for further evaluation. Patient was recently discharged from the hospital 3 days ago after he was admitted for evaluation of acute blood loss anemia from suspected GI source of blood loss.  At that time patient's hemoglobin had dropped from 10.9g/dl to 9.5M/WU8.9g/dl, he complained of having hematemesis as well as bright red blood per rectum.  He was transfused 1 unit of packed RBC and on discharge his hemoglobin was 7.8g/dl.  Patient has a baseline hemoglobin from November 2021 of 14 g/dl. Patient had an upper endoscopy prior to his discharge which showed normal duodenal bulb and second portion of the duodenum. Non-bleeding gastric ulcer with a clean ulcer base (Forrest Class III). Medium-sized hiatal hernia. Normal gastroesophageal junction and esophagus. He was discharged home and advised to come back for an outpatient colonoscopy where he was noted to have a hemoglobin of 6 g/dl prompting admission.  He states that since his discharge he has continued to have melena stools. Patient also complains of abdominal pain which is periumbilical and states that it is different from his chronic abdominal pain.  He rates his pain a 6 x 10 in intensity at its worst.  Pain is nonradiating. He denies  having any chest pain, no shortness of breath, no palpitations, no diaphoresis, no headache, no cough, no fever, no chills, no urinary frequency, no nocturia, no dysuria, no hematuria, no headache, no blurred vision, no mental status changes, no dizziness, no lightheadedness. Labs show sodium 139, potassium 3.2, chloride 106, bicarb 23, glucose 90, BUN 18, creatinine 1.04, calcium 7.1, phosphorus 2.6, magnesium 2.0, white count 12.2, hemoglobin 6.0, hematocrit 17.7, MCV 94.1, RDW 15.8, platelet count 166   ED Course: N/A  Review of Systems: As per HPI otherwise all systems reviewed and negative.    Past Medical History:  Diagnosis Date  . History of kidney stones   . Hypertension   . Stroke [facility](HCC)     Past Surgical History:  Procedure Laterality Date  . APPENDECTOMY    . bowel obstruction    . EXTRACORPOREAL SHOCK WAVE LITHOTRIPSY Right 05/15/2019   Procedure: EXTRACORPOREAL SHOCK WAVE LITHOTRIPSY (ESWL);  Surgeon: Malen GauzeMcKenzie, Patrick L, MD;  Location: [facility] ORS;  Service: Urology;  Laterality: Right;     reports that he has been smoking. He has a 43.00 pack-year smoking history. He has never used smokeless tobacco. He reports previous alcohol use. He reports previous drug use.  Allergies  Allergen Reactions  . Codeine Hives    Family History  Problem Relation Age of Onset  . Heart disease Father   . Seizures Brother      Prior to Admission medications   Medication Sig Start Date  End Date Taking? Authorizing Provider  amLODipine (NORVASC) 10 MG tablet TAKE 1 TABLET BY MOUTH EVERY DAY Patient taking differently: Take 10 mg by mouth daily. 09/01/20   Aura Dials T, NP  aspirin EC 81 MG tablet Take 1 tablet (81 mg total) by mouth daily. Swallow whole. 09/03/20 09/03/21  Danford, Earl Lites, MD  citalopram (CELEXA) 20 MG tablet TAKE 1 TABLET BY MOUTH EVERY DAY Patient taking differently: Take 20 mg by mouth daily. 07/12/20   Cannady, Corrie Dandy T, NP  cyclobenzaprine (FLEXERIL) 10  MG tablet TAKE 1 TABLET BY MOUTH THREE TIMES A DAY AS NEEDED FOR MUSCLE SPASMS Patient taking differently: Take 10 mg by mouth 3 (three) times daily as needed for muscle spasms. 02/17/20   Cannady, Corrie Dandy T, NP  metoprolol tartrate (LOPRESSOR) 25 MG tablet TAKE 1 TABLET BY MOUTH TWICE A DAY Patient taking differently: Take 25 mg by mouth 2 (two) times daily. 07/12/20   Cannady, Corrie Dandy T, NP  omeprazole (PRILOSEC) 20 MG capsule Take 1 capsule (20 mg total) by mouth daily. 09/01/20   Cannady, Corrie Dandy T, NP  ondansetron (ZOFRAN) 4 MG tablet Take 1 tablet (4 mg total) by mouth every 8 (eight) hours as needed for nausea or vomiting. 09/03/20   Vanga, Loel Dubonnet, MD  pravastatin (PRAVACHOL) 20 MG tablet TAKE 1 TABLET BY MOUTH EVERY DAY Patient taking differently: Take 20 mg by mouth daily. 09/16/19   Cannady, Corrie Dandy T, NP  sucralfate (CARAFATE) 1 g tablet Take 1 tablet (1 g total) by mouth 4 (four) times daily -  with meals and at bedtime. 09/01/20   Cannady, Corrie Dandy T, NP  tamsulosin (FLOMAX) 0.4 MG CAPS capsule TAKE 1 CAPSULE BY MOUTH EVERY DAY Patient taking differently: Take 0.4 mg by mouth daily. 08/13/20   Marjie Skiff, NP    Physical Exam: Vitals:   09/06/20 0929 09/06/20 1233 09/06/20 1243  BP: (!) 127/91 (!) 164/104 (!) 143/76  Pulse: 90 90 89  Resp: 18 13 18   Temp: 98.2 F (36.8 C)    TempSrc: Temporal    SpO2: 100% 100% 100%     Vitals:   09/06/20 0929 09/06/20 1233 09/06/20 1243  BP: (!) 127/91 (!) 164/104 (!) 143/76  Pulse: 90 90 89  Resp: 18 13 18   Temp: 98.2 F (36.8 C)    TempSrc: Temporal    SpO2: 100% 100% 100%    Constitutional: NAD, alert and oriented x 3.  Appears to be in painful distress and is pale Eyes: PERRL, lids and conjunctivae pallor ENMT: Mucous membranes are moist.  Neck: normal, supple, no masses, no thyromegaly Respiratory: clear to auscultation bilaterally, no wheezing, no crackles. Normal respiratory effort. No accessory muscle use.   Cardiovascular: Regular rate and rhythm, no murmurs / rubs / gallops. No extremity edema. 2+ pedal pulses. No carotid bruits.  Abdomen: Periumbilical tenderness, no masses palpated. No hepatosplenomegaly. Bowel sounds positive.  Multiple surgical scars over the anterior abdominal wall Musculoskeletal: no clubbing / cyanosis. No joint deformity upper and lower extremities.  Skin: no rashes, lesions, ulcers.  Neurologic: No gross focal neurologic deficit.  Generalized weakness Psychiatric: Normal mood and affect.   Labs on Admission: I have personally reviewed following labs and imaging studies  CBC: Recent Labs  Lab 09/01/20 1553 09/02/20 0758 09/02/20 0758 09/02/20 2137 09/03/20 0059 09/03/20 0357 09/03/20 0830 09/06/20 1100  WBC 10.3 8.3  --   --   --   --   --  12.2*  NEUTROABS 7.1*  --   --   --   --   --   --   --  HGB 10.9* 8.9*   < > 8.5* 7.9* 8.4* 7.8* 6.0*  HCT 32.8* 27.2*   < > 25.4* 23.6* 25.0* 23.2* 17.7*  MCV 97 97.8  --   --   --   --   --  94.1  PLT 340 311  --   --   --   --   --  166   < > = values in this interval not displayed.   Basic Metabolic Panel: Recent Labs  Lab 09/01/20 1607 09/02/20 0758 09/03/20 0357 09/06/20 1100  NA 136 140 136 139  K 5.1 4.0 3.8 3.2*  CL 103 107 108 106  CO2 20 24 22 23   GLUCOSE 100* 128* 97 90  BUN 37* 38* 22* 18  CREATININE 1.40* 1.17 1.02 1.04  CALCIUM 9.4 8.6* 8.0* 7.9*  MG  --   --   --  2.0  PHOS  --   --   --  2.6   GFR: Estimated Creatinine Clearance: 73.6 mL/min (by C-G formula based on SCr of 1.04 mg/dL). Liver Function Tests: Recent Labs  Lab 09/01/20 1607 09/02/20 0758  AST 13 15  ALT 15 15  ALKPHOS 66 47  BILITOT 0.2 0.7  PROT 6.7 6.3*  ALBUMIN 4.3 3.5   Recent Labs  Lab 09/02/20 2137  LIPASE 21   No results for input(s): AMMONIA in the last 168 hours. Coagulation Profile: No results for input(s): INR, PROTIME in the last 168 hours. Cardiac Enzymes: No results for input(s): CKTOTAL,  CKMB, CKMBINDEX, TROPONINI in the last 168 hours. BNP (last 3 results) No results for input(s): PROBNP in the last 8760 hours. HbA1C: No results for input(s): HGBA1C in the last 72 hours. CBG: No results for input(s): GLUCAP in the last 168 hours. Lipid Profile: No results for input(s): CHOL, HDL, LDLCALC, TRIG, CHOLHDL, LDLDIRECT in the last 72 hours. Thyroid Function Tests: No results for input(s): TSH, T4TOTAL, FREET4, T3FREE, THYROIDAB in the last 72 hours. Anemia Panel: No results for input(s): VITAMINB12, FOLATE, FERRITIN, TIBC, IRON, RETICCTPCT in the last 72 hours. Urine analysis:    Component Value Date/Time   COLORURINE YELLOW (A) 07/04/2017 0043   APPEARANCEUR Clear 09/01/2020 1546   LABSPEC 1.006 07/04/2017 0043   LABSPEC 1.008 09/12/2011 1930   PHURINE 5.0 07/04/2017 0043   GLUCOSEU Negative 09/01/2020 1546   GLUCOSEU Negative 09/12/2011 1930   HGBUR NEGATIVE 07/04/2017 0043   BILIRUBINUR Negative 09/01/2020 1546   BILIRUBINUR Negative 09/12/2011 1930   KETONESUR NEGATIVE 07/04/2017 0043   PROTEINUR Negative 09/01/2020 1546   PROTEINUR NEGATIVE 07/04/2017 0043   NITRITE Negative 09/01/2020 1546   NITRITE NEGATIVE 07/04/2017 0043   LEUKOCYTESUR Trace (A) 09/01/2020 1546   LEUKOCYTESUR Negative 09/12/2011 1930    Radiological Exams on Admission: No results found.  EKG: Independently reviewed.   Assessment/Plan Principal Problem:   Acute blood loss anemia Active Problems:   Anxiety and depression   Hypertension    Symptomatic anemia from acute blood loss Most likely from a GI source Patient presents for scheduled outpatient colonoscopy and is noted to be extremely pale with complaints of weakness and fatigue Noted to have a drop in his hemoglobin to 6g/dl from a baseline of 09/14/2011 from 27C/WC Colonoscopy was canceled.  Patient had an upper endoscopy prior to his discharge 3 days ago which showed a nonbleeding gastric ulcer. Patient continues to have  melena stools We will transfuse 2 units of packed RBC Discussed with GI who recommends to obtain a bleeding  scan Monitor serial H&H and transfuse as needed    Hypertension Hold all antihypertensive medications   Anxiety and depression Hold Celexa for now     DVT prophylaxis: SCD Code Status: Full code Family Communication: Greater than 50% of time was spent discussing patient's condition and plan of care with him at the bedside.  All questions and concerns have been addressed.  He verbalizes understanding and agrees with the plan. Disposition Plan: Back to previous home environment Consults called: Gastroenterology    Lucile Shutters MD [facility]     09/06/2020, 1:25 PM

## 2020-09-06 NOTE — Anesthesia Preprocedure Evaluation (Signed)
Anesthesia Evaluation    Airway        Dental   Pulmonary COPD, Current Smoker and Patient abstained from smoking.,           Cardiovascular hypertension,      Neuro/Psych PSYCHIATRIC DISORDERS Anxiety Depression CVA    GI/Hepatic   Endo/Other    Renal/GU      Musculoskeletal   Abdominal   Peds  Hematology  (+) Blood dyscrasia, anemia ,   Anesthesia Other Findings   Reproductive/Obstetrics                             Anesthesia Physical Anesthesia Plan Anesthesia Quick Evaluation

## 2020-09-07 ENCOUNTER — Inpatient Hospital Stay: Payer: Medicare HMO | Admitting: Anesthesiology

## 2020-09-07 ENCOUNTER — Encounter
Admission: RE | Disposition: A | Discharge status: Home / Self Care | Payer: Self-pay | Source: Home / Self Care | Attending: Internal Medicine

## 2020-09-07 ENCOUNTER — Inpatient Hospital Stay: Payer: Medicare HMO

## 2020-09-07 ENCOUNTER — Encounter: Payer: Self-pay | Admitting: Internal Medicine

## 2020-09-07 DIAGNOSIS — D62 Acute posthemorrhagic anemia: Secondary | ICD-10-CM | POA: Diagnosis not present

## 2020-09-07 DIAGNOSIS — K625 Hemorrhage of anus and rectum: Secondary | ICD-10-CM

## 2020-09-07 DIAGNOSIS — K3182 Dieulafoy lesion (hemorrhagic) of stomach and duodenum: Secondary | ICD-10-CM

## 2020-09-07 HISTORY — PX: COLONOSCOPY: SHX5424

## 2020-09-07 LAB — BASIC METABOLIC PANEL
Anion gap: 12 (ref 5–15)
BUN: 13 mg/dL (ref 6–20)
CO2: 22 mmol/L (ref 22–32)
Calcium: 7.7 mg/dL — ABNORMAL LOW (ref 8.9–10.3)
Chloride: 103 mmol/L (ref 98–111)
Creatinine, Ser: 0.97 mg/dL (ref 0.61–1.24)
GFR, Estimated: >60 mL/min (ref >60)
Glucose, Bld: 74 mg/dL (ref 70–99)
Potassium: 3.1 mmol/L — ABNORMAL LOW (ref 3.5–5.1)
Sodium: 137 mmol/L (ref 135–145)

## 2020-09-07 LAB — PREPARE RBC (CROSSMATCH)

## 2020-09-07 LAB — MAGNESIUM: Magnesium: 1.8 mg/dL (ref 1.7–2.4)

## 2020-09-07 LAB — HEMOGLOBIN AND HEMATOCRIT, BLOOD
HCT: 24.8 % — ABNORMAL LOW (ref 39.0–52.0)
HCT: 22.3 % — ABNORMAL LOW (ref 39.0–52.0)
Hemoglobin: 8.4 g/dL — ABNORMAL LOW (ref 13.0–17.0)
Hemoglobin: 7.5 g/dL — ABNORMAL LOW (ref 13.0–17.0)

## 2020-09-07 SURGERY — COLONOSCOPY
Anesthesia: General

## 2020-09-07 MED ORDER — VITAMIN B-12 1000 MCG PO TABS
1000.0000 ug | ORAL_TABLET | Freq: Every day | ORAL | Status: DC
  Administered 2020-09-08 – 2020-09-09 (×2): 1000 ug via ORAL
  Filled 2020-09-07 (×2): qty 1

## 2020-09-07 MED ORDER — SODIUM CHLORIDE 0.9 % IV SOLN
INTRAVENOUS | Status: DC
  Administered 2020-09-07: 1000 mL via INTRAVENOUS

## 2020-09-07 MED ORDER — EPINEPHRINE 1 MG/10ML IJ SOSY
PREFILLED_SYRINGE | INTRAMUSCULAR | Status: AC
  Filled 2020-09-07: qty 10

## 2020-09-07 MED ORDER — POTASSIUM CHLORIDE CRYS ER 20 MEQ PO TBCR
40.0000 meq | EXTENDED_RELEASE_TABLET | Freq: Once | ORAL | Status: AC
  Administered 2020-09-07: 40 meq via ORAL
  Filled 2020-09-07: qty 2

## 2020-09-07 MED ORDER — ENSURE ENLIVE PO LIQD
237.0000 mL | Freq: Two times a day (BID) | ORAL | Status: DC
  Administered 2020-09-08 – 2020-09-09 (×2): 237 mL via ORAL

## 2020-09-07 MED ORDER — TECHNETIUM TC 99M-LABELED RED BLOOD CELLS IV KIT
20.0000 | PACK | Freq: Once | INTRAVENOUS | Status: AC | PRN
  Administered 2020-09-07: 23.72 via INTRAVENOUS

## 2020-09-07 MED ORDER — PROPOFOL 10 MG/ML IV BOLUS
INTRAVENOUS | Status: AC
  Filled 2020-09-07: qty 20

## 2020-09-07 MED ORDER — GLYCOPYRROLATE 0.2 MG/ML IJ SOLN
INTRAMUSCULAR | Status: DC | PRN
  Administered 2020-09-07: .2 mg via INTRAVENOUS

## 2020-09-07 MED ORDER — ONDANSETRON HCL 4 MG/2ML IJ SOLN
INTRAMUSCULAR | Status: DC | PRN
  Administered 2020-09-07: 4 mg via INTRAVENOUS

## 2020-09-07 MED ORDER — PHENYLEPHRINE HCL (PRESSORS) 10 MG/ML IV SOLN
INTRAVENOUS | Status: DC | PRN
  Administered 2020-09-07 (×3): 200 ug via INTRAVENOUS
  Administered 2020-09-07: 150 ug via INTRAVENOUS
  Administered 2020-09-07 (×2): 200 ug via INTRAVENOUS
  Administered 2020-09-07: 150 ug via INTRAVENOUS
  Administered 2020-09-07 (×3): 200 ug via INTRAVENOUS
  Administered 2020-09-07: 50 ug via INTRAVENOUS
  Administered 2020-09-07: 300 ug via INTRAVENOUS
  Administered 2020-09-07: 50 ug via INTRAVENOUS
  Administered 2020-09-07: 200 ug via INTRAVENOUS
  Administered 2020-09-07: 100 ug via INTRAVENOUS
  Administered 2020-09-07: 200 ug via INTRAVENOUS

## 2020-09-07 MED ORDER — LIDOCAINE HCL (CARDIAC) PF 100 MG/5ML IV SOSY
PREFILLED_SYRINGE | INTRAVENOUS | Status: DC | PRN
  Administered 2020-09-07: 50 mg via INTRAVENOUS

## 2020-09-07 MED ORDER — PROPOFOL 500 MG/50ML IV EMUL
INTRAVENOUS | Status: DC | PRN
  Administered 2020-09-07: 125 ug/kg/min via INTRAVENOUS

## 2020-09-07 MED ORDER — ONDANSETRON HCL 4 MG/2ML IJ SOLN
INTRAMUSCULAR | Status: AC
  Filled 2020-09-07: qty 2

## 2020-09-07 MED ORDER — SODIUM CHLORIDE 0.9% IV SOLUTION: Freq: Once | INTRAVENOUS | Status: AC

## 2020-09-07 MED ORDER — PROPOFOL 10 MG/ML IV BOLUS
INTRAVENOUS | Status: DC | PRN
  Administered 2020-09-07: 20 mg via INTRAVENOUS
  Administered 2020-09-07: 40 mg via INTRAVENOUS
  Administered 2020-09-07: 30 mg via INTRAVENOUS

## 2020-09-07 MED ORDER — GLYCOPYRROLATE 0.2 MG/ML IJ SOLN
INTRAMUSCULAR | Status: AC
  Filled 2020-09-07: qty 1

## 2020-09-07 NOTE — Progress Notes (Signed)
Patient in today for scheduled Colonoscopy with Dr. Allegra Lai.  Patient states he completed the colon prep and has noticed 'a lot' in his bowel movements.  He complains of 'feeling terrible' and having no energy.  His vital signs are within normal limits, but his skin is pale.  Dr. Allegra Lai in to see the patient and ordered STAT CBC which demonstrated a hemoglobin of 6.0.  After discussion with anesthesia, it was decided by Dr. Allegra Lai to cancel the Colonoscopy scheduled for today and admit the patient for treatment of his symptomatic anemia.

## 2020-09-07 NOTE — Op Note (Signed)
[facility] Gastroenterology Patient Name: Mark [name] Procedure Date: 09/07/2020 4:12 PM MRN: [id number] Account #: [id number] Date of Birth: 1961/02/16 Admit Type: Inpatient Age: 60 Room: [facility] ENDO ROOM 1 Gender: Male Note Status: Finalized Procedure:             Upper GI endoscopy Providers:             Sydnie Sigmund B. Bonna Gains MD, MD Referring MD:          Forest Gleason Md, MD (Referring MD) Medicines:             Monitored Anesthesia Care Complications:         No immediate complications. Procedure:             Pre-Anesthesia Assessment:                        - Prior to the procedure, a History and Physical was                         performed, and patient medications, allergies and                         sensitivities were reviewed. The patient's tolerance                         of previous anesthesia was reviewed.                        - The risks and benefits of the procedure and the                         sedation options and risks were discussed with the                         patient. All questions were answered and informed                         consent was obtained.                        - Patient identification and proposed procedure were                         verified prior to the procedure by the physician, the                         nurse, the anesthesiologist, the anesthetist and the                         technician. The procedure was verified in the                         pre-procedure area in the procedure room in the                         endoscopy suite.                        - Prophylactic Antibiotics: The patient does not  require prophylactic antibiotics.                        - ASA Grade Assessment: III - A patient with severe                         systemic disease.                        - After reviewing the risks and benefits, the patient                         was deemed in satisfactory  condition to undergo the                         procedure.                        - Monitored anesthesia care was determined to be                         medically necessary for this procedure based on review                         of the patient's medical history, medications, and                         prior anesthesia history.                        - The anesthesia plan was to use monitored anesthesia                         care (MAC).                        After obtaining informed consent, the endoscope was                         passed under direct vision. Throughout the procedure,                         the patient's blood pressure, pulse, and oxygen                         saturations were monitored continuously. The                         Colonoscope was introduced through the mouth, and                         advanced to the second part of duodenum. The upper GI                         endoscopy was accomplished with ease. The patient                         tolerated the procedure well. Findings:      The examined esophagus was normal.      Red blood was found in the gastric fundus.  The scope was withdrawn and       replaced with the therapeutic endoscope in order to enhance visibility.       Large amounts of clots were suctioned.      A Dieulafoy lesion with no bleeding and stigmata of recent bleeding was       found in the gastric fundus. For hemostasis, two hemostatic clips were       successfully placed. There was no bleeding at the end of the procedure.      The examined duodenum was normal. Impression:            - Normal esophagus.                        - Red blood in the gastric fundus.                        - Dieulafoy lesion of stomach.                        - Normal examined duodenum.                        - No specimens collected. Recommendation:        - Return patient to hospital ward for ongoing care.                        - Continue present  medications.                        - The findings and recommendations were discussed with                         the patient.                        - Use Protonix (pantoprazole) 40 mg IV daily.                        - Continue Serial CBCs and transfuse PRN                        - Consult vascular surgery in the event of recurrent                         bleeding.                        - The findings and recommendations were discussed with                         the patient.                        - The findings and recommendations were discussed with                         the patient's family.                        - Maintain NPO for 4-6 hrs and if no further active  bleeding can start oral diet Procedure Code(s):     --- Professional ---                        [phone], Esophagogastroduodenoscopy, flexible,                         transoral; with control of bleeding, any method Diagnosis Code(s):     --- Professional ---                        K92.2, Gastrointestinal hemorrhage, unspecified                        K31.82, Dieulafoy lesion (hemorrhagic) of stomach and                         duodenum CPT copyright 2019 American Medical Association. All rights reserved. The codes documented in this report are preliminary and upon coder review may  be revised to meet current compliance requirements.  Vonda Antigua, MD Margretta Sidle B. Bonna Gains MD, MD 09/07/2020 5:20:35 PM This report has been signed electronically. Number of Addenda: 0 Note Initiated On: 09/07/2020 4:12 PM Estimated Blood Loss:  Estimated blood loss: none.      [facility]

## 2020-09-07 NOTE — Progress Notes (Signed)
Patient resting quietly with eyes closed following Colonoscopy and Push Enteroscopy with Dr. Maximino Greenland.  Vital signs are stable, oxygen saturation 100% on room air.  Report called to Judeth Cornfield, RN on 2A.  Will transport patient via bed to inpatient room 234.   Dr. Maximino Greenland did speak with the patient at the bedside and his mother by phone regarding the findings of the procedures.

## 2020-09-07 NOTE — Progress Notes (Signed)
Melodie Bouillon, MD [address], [address], [place], [place], [address] [address], [address], [place], [place], [address] Phone: [phone]  Fax: [phone]   Subjective:  Pt reports he had BRBPR this morning. No emesis.   Objective: Exam: Vital signs in last 24 hours: Vitals:   09/07/20 0238 09/07/20 0315 09/07/20 0809 09/07/20 1208  BP:  (!) 145/87 (!) 137/93 138/89  Pulse:  79 80   Resp:  14 18   Temp:  98.7 F (37.1 C) 98 F (36.7 C)   TempSrc:  Oral Oral   SpO2:  99% 100%   Weight: 71.5 kg     Height:       Weight change:   Intake/Output Summary (Last 24 hours) at 09/07/2020 1505 Last data filed at 09/07/2020 0747 Gross per 24 hour  Intake 664.46 ml  Output 900 ml  Net -235.54 ml    General: No acute distress, AAO x3 Abd: Soft, NT/ND, No HSM Skin: Warm, no rashes Neck: Supple, Trachea midline   Lab Results: Lab Results  Component Value Date   WBC 12.2 (H) 09/06/2020   HGB 7.5 (L) 09/07/2020   HCT 22.3 (L) 09/07/2020   MCV 94.1 09/06/2020   PLT 166 09/06/2020   Micro Results: Recent Results (from the past 240 hour(s))  Microscopic Examination     Status: Abnormal   Collection Time: 09/01/20  3:46 PM   Urine  Result Value Ref Range Status   WBC, UA 6-10 (A) 0 - 5 /hpf Final   RBC 11-30 (A) 0 - 2 /hpf Final   Epithelial Cells (non renal) 0-10 0 - 10 /hpf Final   Bacteria, UA None seen None seen/Few Final  Novel Coronavirus, NAA ([facility])     Status: None   Collection Time: 09/01/20  4:35 PM   Specimen: Nasopharyngeal(NP) swabs in vial transport medium  Result Value Ref Range Status   SARS-CoV-2, NAA Not Detected Not Detected Final    Comment: This nucleic acid amplification test was developed and its performance characteristics determined by [employer]. Nucleic acid amplification tests include RT-PCR and TMA. This test has not been FDA cleared or approved. This test has been authorized by FDA under an Emergency Use  Authorization (EUA). This test is only authorized for the duration of time the declaration that circumstances exist justifying the authorization of the emergency use of in vitro diagnostic tests for detection of SARS-CoV-2 virus and/or diagnosis of COVID-19 infection under section 564(b)(1) of the Act, 21 U.S.C. 062BJS-2(G) (1), unless the authorization is terminated or revoked sooner. When diagnostic testing is negative, the possibility of a false negative result should be considered in the context of a patient's recent exposures and the presence of clinical signs and symptoms consistent with COVID-19. An individual without symptoms of COVID-19 and who is not shedding SARS-CoV-2 virus wo uld expect to have a negative (not detected) result in this assay.   SARS-COV-2, NAA 2 DAY TAT     Status: None   Collection Time: 09/01/20  4:35 PM  Result Value Ref Range Status   SARS-CoV-2, NAA 2 DAY TAT Performed  Final  SARS Coronavirus 2 by RT PCR (hospital order, performed in [facility] hospital lab) Nasopharyngeal Nasopharyngeal Swab     Status: None   Collection Time: 09/02/20 12:49 PM   Specimen: Nasopharyngeal Swab  Result Value Ref Range Status   SARS Coronavirus 2 NEGATIVE NEGATIVE Final    Comment: (NOTE) SARS-CoV-2 target nucleic acids are NOT DETECTED.  The SARS-CoV-2 RNA is generally detectable in upper and lower respiratory specimens during the acute phase of infection. The lowest concentration of SARS-CoV-2 viral copies this assay can detect is 250 copies / mL. A negative result does not preclude SARS-CoV-2 infection and should not be used as the sole basis for treatment or other patient management decisions.  A negative result may occur with improper specimen collection / handling, submission of specimen other than nasopharyngeal swab, presence of viral mutation(s) within the areas targeted by this assay, and inadequate number of viral copies (<250 copies / mL). A negative  result must be combined with clinical observations, patient history, and epidemiological information.  Fact Sheet for Patients:   BoilerBrush.com.cy  Fact Sheet for Healthcare Providers: https://pope.com/  This test is not yet approved or  cleared by the [place] FDA and has been authorized for detection and/or diagnosis of SARS-CoV-2 by FDA under an Emergency Use Authorization (EUA).  This EUA will remain in effect (meaning this test can be used) for the duration of the COVID-19 declaration under Section 564(b)(1) of the Act, 21 U.S.C. section 360bbb-3(b)(1), unless the authorization is terminated or revoked sooner.  Performed at [facility], [address] [place], [place] [address]    Studies/Results: NM GI Blood Loss  Result Date: 09/07/2020 CLINICAL DATA:  60 year old with anemia and evaluate for GI bleed. Non bleeding gastric ulcer seen on recent upper endoscopy. EXAM: NUCLEAR MEDICINE GASTROINTESTINAL BLEEDING SCAN TECHNIQUE: Sequential abdominal images were obtained following intravenous administration of Tc-59m labeled red blood cells. RADIOPHARMACEUTICALS:  23.72 mCi Tc-64m pertechnetate in-vitro labeled red cells. COMPARISON:  CT abdomen and pelvis 09/02/2020 FINDINGS: Expected uptake in the vascular structures. No evidence for GI bleeding. Expected uptake in the urinary bladder. IMPRESSION: Negative for GI bleeding. Electronically Signed   By: Richarda Overlie M.D.   On: 09/07/2020 13:12   Medications:  Scheduled Meds: . feeding supplement  237 mL Oral BID BM  . pantoprazole (PROTONIX) IV  40 mg Intravenous Q12H  . vitamin B-12  1,000 mcg Oral Daily   Continuous Infusions: PRN Meds:.morphine injection, ondansetron **OR** ondansetron (ZOFRAN) IV   Assessment: Principal Problem:   Acute blood loss anemia Active Problems:   Anxiety and depression   Hypertension    Plan: Bleeding scan was negative  today However, patient reports bright red blood per rectum this morning  Patient has not had anything solid to eat in 2 days and completed most of his prep day before yesterday.  Given ongoing bleeding, and completion of most of his prep day before yesterday as his outpatient colonoscopy was planned for yesterday, will proceed with colonoscopy today for further evaluation of his anemia and blood per rectum  I have discussed alternative options, risks & benefits,  which include, but are not limited to, bleeding, infection, perforation,respiratory complication & drug reaction.  The patient agrees with this plan & written consent will be obtained.      LOS: 1 day   Melodie Bouillon, MD 09/07/2020, 3:05 PM

## 2020-09-07 NOTE — Progress Notes (Signed)
PROGRESS NOTE    Mark [name]  ZOX:[id number] DOB: [birth year] DOA: 09/06/2020 PCP: Marjie Skiffannady, Jolene T, NP   Brief Narrative: Taken from H&P Mark [name] is a 60 y.o. male with medical history significant for  hypertension, history of CVA, s/p multiple surgeries for SBO, nicotine dependencewho presented for an outpatient colonoscopy and complained of generalized weakness and fatigue.  Patient appeared very pale and a CBC was done and his hemoglobin returned at 6.0g/dl. His procedure was canceled and request was made for admission for further evaluation. Patient was recently discharged from the hospital 3 days ago after he was admitted for evaluation of acute blood loss anemia from suspected GI source of blood loss.  At that time patient's hemoglobin had dropped from 10.9g/dl to 8.1X/BJ8.9g/dl, he complained of having hematemesis as well as bright red blood per rectum.  He was transfused 1 unit of packed RBC and on discharge his hemoglobin was 7.8g/dl.  Patient has a baseline hemoglobin from November 2021 of 14 g/dl. Patient had an upper endoscopy prior to his discharge which showed normal duodenal bulb and second portion of the duodenum. Non-bleeding gastric ulcer with a clean ulcer base (Forrest Class III). Medium-sized hiatal hernia. Normal gastroesophageal junction and esophagus. He was discharged home and advised to come back for an outpatient colonoscopy where he was noted to have a hemoglobin of 6 g/dl prompting admission.  He states that since his discharge he has continued to have melena stools.  Patient received 2 units of PRBC, with improvement of hemoglobin to 8.4 and a subsequent drop to 7.5.  Tagged RBC scan was negative. Continue to have bleeding per rectum. Going for colonoscopy later today  Subjective: Patient continued to have bright red blood per rectum, no hematemesis. Going for tagged RBC scan.  Assessment & Plan:   Principal Problem:   Acute blood loss anemia Active  Problems:   Anxiety and depression   Hypertension  Acute blood loss anemia secondary to GI losses.  Hemoglobin increased to 8.4 with another drop to 7.5 with subsequent check.  Received 2 units of PRBC.  Tagged RBC scan done today and it was negative. Continue to have bleeding per rectum. As he took most of the prep and was n.p.o., GI decided to proceed with colonoscopy. -Give him another unit of PRBC. -Monitor hemoglobin  Hypertension.  Blood pressure within goal. -Keep holding home antihypertensives.  History of anxiety and depression.  No acute concern. -Holding Celexa as he is n.p.o.  Objective: Vitals:   09/07/20 0809 09/07/20 1208 09/07/20 1510 09/07/20 1528  BP: (!) 137/93 138/89 119/90 (!) 128/98  Pulse: 80  76 84  Resp: 18  18 20   Temp: 98 F (36.7 C)  98.7 F (37.1 C) (!) 97.5 F (36.4 C)  TempSrc: Oral   Temporal  SpO2: 100%  99% 100%  Weight:      Height:        Intake/Output Summary (Last 24 hours) at 09/07/2020 1539 Last data filed at 09/07/2020 0747 Gross per 24 hour  Intake 662 ml  Output 900 ml  Net -238 ml   Filed Weights   09/06/20 1949 09/07/20 0238  Weight: 69.3 kg 71.5 kg    Examination:  General exam: Appears calm and comfortable  Respiratory system: Clear to auscultation. Respiratory effort normal. Cardiovascular system: S1 & S2 heard, RRR.  Gastrointestinal system: Soft, mild peri and plical tenderness, nondistended, bowel sounds positive. Central nervous system: Alert and oriented. No focal neurological deficits. Extremities: No  edema, no cyanosis, pulses intact and symmetrical. Psychiatry: Judgement and insight appear normal.    DVT prophylaxis: SCDs, Code Status: Full Family Communication:  Disposition Plan:  Status is: Inpatient  Remains inpatient appropriate because:Inpatient level of care appropriate due to severity of illness   Dispo: The patient is from: Home              Anticipated d/c is to: Home               Anticipated d/c date is: 2 days              Patient currently is not medically stable to d/c.   Difficult to place patient No   Consultants:   GI  Procedures:  Antimicrobials:   Data Reviewed: I have personally reviewed following labs and imaging studies  CBC: Recent Labs  Lab 09/01/20 1553 09/02/20 0758 09/02/20 2137 09/03/20 0830 09/06/20 1100 09/06/20 2219 09/07/20 0441 09/07/20 1316  WBC 10.3 8.3  --   --  12.2*  --   --   --   NEUTROABS 7.1*  --   --   --   --   --   --   --   HGB 10.9* 8.9*   < > 7.8* 6.0* 7.7* 8.4* 7.5*  HCT 32.8* 27.2*   < > 23.2* 17.7* 23.2* 24.8* 22.3*  MCV 97 97.8  --   --  94.1  --   --   --   PLT 340 311  --   --  166  --   --   --    < > = values in this interval not displayed.   Basic Metabolic Panel: Recent Labs  Lab 09/01/20 1607 09/02/20 0758 09/03/20 0357 09/06/20 1100 09/07/20 0441  NA 136 140 136 139 137  K 5.1 4.0 3.8 3.2* 3.1*  CL 103 107 108 106 103  CO2 20 24 22 23 22   GLUCOSE 100* 128* 97 90 74  BUN 37* 38* 22* 18 13  CREATININE 1.40* 1.17 1.02 1.04 0.97  CALCIUM 9.4 8.6* 8.0* 7.9* 7.7*  MG  --   --   --  2.0 1.8  PHOS  --   --   --  2.6  --    GFR: Estimated Creatinine Clearance: 81 mL/min (by C-G formula based on SCr of 0.97 mg/dL). Liver Function Tests: Recent Labs  Lab 09/01/20 1607 09/02/20 0758  AST 13 15  ALT 15 15  ALKPHOS 66 47  BILITOT 0.2 0.7  PROT 6.7 6.3*  ALBUMIN 4.3 3.5   Recent Labs  Lab 09/02/20 2137  LIPASE 21   No results for input(s): AMMONIA in the last 168 hours. Coagulation Profile: No results for input(s): INR, PROTIME in the last 168 hours. Cardiac Enzymes: No results for input(s): CKTOTAL, CKMB, CKMBINDEX, TROPONINI in the last 168 hours. BNP (last 3 results) No results for input(s): PROBNP in the last 8760 hours. HbA1C: No results for input(s): HGBA1C in the last 72 hours. CBG: No results for input(s): GLUCAP in the last 168 hours. Lipid Profile: No results for  input(s): CHOL, HDL, LDLCALC, TRIG, CHOLHDL, LDLDIRECT in the last 72 hours. Thyroid Function Tests: No results for input(s): TSH, T4TOTAL, FREET4, T3FREE, THYROIDAB in the last 72 hours. Anemia Panel: No results for input(s): VITAMINB12, FOLATE, FERRITIN, TIBC, IRON, RETICCTPCT in the last 72 hours. Sepsis Labs: No results for input(s): PROCALCITON, LATICACIDVEN in the last 168 hours.  Recent Results (from the past 240 hour(s))  Microscopic Examination     Status: Abnormal   Collection Time: 09/01/20  3:46 PM   Urine  Result Value Ref Range Status   WBC, UA 6-10 (A) 0 - 5 /hpf Final   RBC 11-30 (A) 0 - 2 /hpf Final   Epithelial Cells (non renal) 0-10 0 - 10 /hpf Final   Bacteria, UA None seen None seen/Few Final  Novel Coronavirus, NAA ([facility])     Status: None   Collection Time: 09/01/20  4:35 PM   Specimen: Nasopharyngeal(NP) swabs in vial transport medium  Result Value Ref Range Status   SARS-CoV-2, NAA Not Detected Not Detected Final    Comment: This nucleic acid amplification test was developed and its performance characteristics determined by [employer]. Nucleic acid amplification tests include RT-PCR and TMA. This test has not been FDA cleared or approved. This test has been authorized by FDA under an Emergency Use Authorization (EUA). This test is only authorized for the duration of time the declaration that circumstances exist justifying the authorization of the emergency use of in vitro diagnostic tests for detection of SARS-CoV-2 virus and/or diagnosis of COVID-19 infection under section 564(b)(1) of the Act, 21 U.S.C. 177LTJ-0(Z) (1), unless the authorization is terminated or revoked sooner. When diagnostic testing is negative, the possibility of a false negative result should be considered in the context of a patient's recent exposures and the presence of clinical signs and symptoms consistent with COVID-19. An individual without symptoms of COVID-19 and  who is not shedding SARS-CoV-2 virus wo uld expect to have a negative (not detected) result in this assay.   SARS-COV-2, NAA 2 DAY TAT     Status: None   Collection Time: 09/01/20  4:35 PM  Result Value Ref Range Status   SARS-CoV-2, NAA 2 DAY TAT Performed  Final  SARS Coronavirus 2 by RT PCR (hospital order, performed in [facility] hospital lab) Nasopharyngeal Nasopharyngeal Swab     Status: None   Collection Time: 09/02/20 12:49 PM   Specimen: Nasopharyngeal Swab  Result Value Ref Range Status   SARS Coronavirus 2 NEGATIVE NEGATIVE Final    Comment: (NOTE) SARS-CoV-2 target nucleic acids are NOT DETECTED.  The SARS-CoV-2 RNA is generally detectable in upper and lower respiratory specimens during the acute phase of infection. The lowest concentration of SARS-CoV-2 viral copies this assay can detect is 250 copies / mL. A negative result does not preclude SARS-CoV-2 infection and should not be used as the sole basis for treatment or other patient management decisions.  A negative result may occur with improper specimen collection / handling, submission of specimen other than nasopharyngeal swab, presence of viral mutation(s) within the areas targeted by this assay, and inadequate number of viral copies (<250 copies / mL). A negative result must be combined with clinical observations, patient history, and epidemiological information.  Fact Sheet for Patients:   BoilerBrush.com.cy  Fact Sheet for Healthcare Providers: https://pope.com/  This test is not yet approved or  cleared by the [place] FDA and has been authorized for detection and/or diagnosis of SARS-CoV-2 by FDA under an Emergency Use Authorization (EUA).  This EUA will remain in effect (meaning this test can be used) for the duration of the COVID-19 declaration under Section 564(b)(1) of the Act, 21 U.S.C. section 360bbb-3(b)(1), unless the authorization is terminated  or revoked sooner.  Performed at [facility], [address], [place], [place] [address]      Radiology Studies: NM GI Blood Loss  Result  Date: 09/07/2020 CLINICAL DATA:  60 year old with anemia and evaluate for GI bleed. Non bleeding gastric ulcer seen on recent upper endoscopy. EXAM: NUCLEAR MEDICINE GASTROINTESTINAL BLEEDING SCAN TECHNIQUE: Sequential abdominal images were obtained following intravenous administration of Tc-44m labeled red blood cells. RADIOPHARMACEUTICALS:  23.72 mCi Tc-59m pertechnetate in-vitro labeled red cells. COMPARISON:  CT abdomen and pelvis 09/02/2020 FINDINGS: Expected uptake in the vascular structures. No evidence for GI bleeding. Expected uptake in the urinary bladder. IMPRESSION: Negative for GI bleeding. Electronically Signed   By: Richarda Overlie M.D.   On: 09/07/2020 13:12    Scheduled Meds: . [MAR Hold] feeding supplement  237 mL Oral BID BM  . [MAR Hold] pantoprazole (PROTONIX) IV  40 mg Intravenous Q12H  . [MAR Hold] vitamin B-12  1,000 mcg Oral Daily   Continuous Infusions: . sodium chloride 1,000 mL (09/07/20 1530)     LOS: 1 day   Time spent: 35 minutes.  Arnetha Courser, MD [facility]  If 7PM-7AM, please contact night-coverage [email or web address]  09/07/2020, 3:39 PM   This record has been created using Conservation officer, historic buildings. Errors have been sought and corrected,but may not always be located. Such creation errors do not reflect on the standard of care.

## 2020-09-07 NOTE — Transfer of Care (Signed)
Immediate Anesthesia Transfer of Care Note  Patient: Mark [name]  Procedure(s) Performed: COLONOSCOPY (N/A )  Patient Location: Endoscopy Unit  Anesthesia Type:General  Level of Consciousness: awake and alert   Airway & Oxygen Therapy: Patient Spontanous Breathing  Post-op Assessment: Report given to RN and Post -op Vital signs reviewed and stable  Post vital signs: Reviewed and stable  Last Vitals:  Vitals Value Taken Time  BP 103/73 09/07/20 1717  Temp 36.6 C 09/07/20 1712  Pulse 86 09/07/20 1718  Resp 12 09/07/20 1718  SpO2 100 % 09/07/20 1718  Vitals shown include unvalidated device data.  Last Pain:  Vitals:   09/07/20 1712  TempSrc: Temporal  PainSc: Asleep      Patients Stated Pain Goal: 7 (09/07/20 0315)  Complications: No complications documented.

## 2020-09-07 NOTE — Op Note (Signed)
[facility] Patient Name: Mark [name] Procedure Date: 09/07/2020 3:35 PM MRN: [id number] Account #: [id number] Date of Birth: April 21, 1961 Admit Type: Inpatient Age: 60 Room: [facility] ENDO ROOM 1 Gender: Male Note Status: Finalized Procedure:             Colonoscopy Indications:           Hematochezia Providers:             Atlas Kuc B. Maximino Greenland MD, MD Referring MD:          Sallye Lat Md, MD (Referring MD) Medicines:             Monitored Anesthesia Care, General Anesthesia Complications:         No immediate complications. Procedure:             Pre-Anesthesia Assessment:                        - Prior to the procedure, a History and Physical was                         performed, and patient medications, allergies and                         sensitivities were reviewed. The patient's tolerance                         of previous anesthesia was reviewed.                        - The risks and benefits of the procedure and the                         sedation options and risks were discussed with the                         patient. All questions were answered and informed                         consent was obtained.                        - Patient identification and proposed procedure were                         verified prior to the procedure by the physician, the                         nurse, the anesthesiologist, the anesthetist and the                         technician. The procedure was verified in the                         pre-procedure area in the procedure room in the                         endoscopy suite.                        - Prophylactic Antibiotics: The patient  does not                         require prophylactic antibiotics.                        - ASA Grade Assessment: III - A patient with severe                         systemic disease.                        - After reviewing the risks and benefits, the patient                          was deemed in satisfactory condition to undergo the                         procedure.                        - Monitored anesthesia care was determined to be                         medically necessary for this procedure based on review                         of the patient's medical history, medications, and                         prior anesthesia history.                        - The anesthesia plan was to use monitored anesthesia                         care (MAC).                        After obtaining informed consent, the colonoscope was                         passed under direct vision. Throughout the procedure,                         the patient's blood pressure, pulse, and oxygen                         saturations were monitored continuously. The                         Colonoscope was introduced through the anus and                         advanced to the the cecum, identified by appendiceal                         orifice and ileocecal valve. The colonoscopy was                         performed with ease. The  patient tolerated the                         procedure well. The quality of the bowel preparation                         was poor. Findings:      The digital rectal exam findings include red blood.      Red blood was found in the entire colon.      There was evidence of a prior surgical anastomosis in the cecum. This       was patent. The anastomosis was traversed.      A 15 mm, non-bleeding polyp was found in the sigmoid colon. The polyp       was sessile. Due to blood throughout the colon, this was not removed on       this exam.      The terminal ileum contained red blood. Impression:            - Preparation of the colon was poor.                        - Red blood. found on digital rectal exam.                        - Blood in the entire examined colon.                        - Patent surgical anastomosis.                        - One 15  mm, non-bleeding polyp in the sigmoid colon.                        - Blood in the terminal ileum.                        - No specimens collected. Recommendation:        - Due to the presence of red blood in the ileum, the                         source of bleeding is likely more proximal. Therefore,                         decision was made that pt would benefit from a push                         enteroscopy (since EGD was already done 3 days ago,                         and Bleeding scan today was negative). Consent was                         obtained from patient's mother over the phone.                        - Continue Serial CBCs and transfuse PRN                        -  Pt will need repeat colonoscopy on an elective basis                         within 4-8 weeks for removal of colon polyp                        - See push enteroscopy report for further findings and                         recommendations Procedure Code(s):     --- Professional ---                        [phone], Colonoscopy, flexible; diagnostic, including                         collection of specimen(s) by brushing or washing, when                         performed (separate procedure) Diagnosis Code(s):     --- Professional ---                        K92.2, Gastrointestinal hemorrhage, unspecified                        Z98.0, Intestinal bypass and anastomosis status                        K63.5, Polyp of colon                        K92.1, Melena (includes Hematochezia) CPT copyright 2019 American Medical Association. All rights reserved. The codes documented in this report are preliminary and upon coder review may  be revised to meet current compliance requirements.  Melodie Bouillon, MD Michel Bickers B. Maximino Greenland MD, MD 09/07/2020 5:12:08 PM This report has been signed electronically. Number of Addenda: 0 Note Initiated On: 09/07/2020 3:35 PM Scope Withdrawal Time: 0 hours 8 minutes 0 seconds  Total Procedure  Duration: 0 hours 20 minutes 2 seconds  Estimated Blood Loss:  Estimated blood loss: none.      [facility]

## 2020-09-07 NOTE — Anesthesia Preprocedure Evaluation (Signed)
Anesthesia Evaluation  Patient identified by MRN, date of birth, ID band Patient awake    Reviewed: Allergy & Precautions, H&P , NPO status , Patient's Chart, lab work & pertinent test results  History of Anesthesia Complications Negative for: history of anesthetic complications  Airway Mallampati: II  TM Distance: >3 FB     Dental  (+) Chipped   Pulmonary neg sleep apnea, COPD, Current Smoker and Patient abstained from smoking.,    breath sounds clear to auscultation       Cardiovascular hypertension, (-) angina(-) Past MI and (-) Cardiac Stents (-) dysrhythmias  Rhythm:regular Rate:Normal     Neuro/Psych PSYCHIATRIC DISORDERS Anxiety Depression CVA    GI/Hepatic negative GI ROS, Neg liver ROS,   Endo/Other  negative endocrine ROS  Renal/GU negative Renal ROS  negative genitourinary   Musculoskeletal   Abdominal   Peds  Hematology  (+) Blood dyscrasia, anemia ,   Anesthesia Other Findings Pt has had multiple bouts of melena for days. Hgb currently 7.5 after pRBC  Past Medical History: No date: History of kidney stones No date: Hypertension No date: Stroke [facility])  Past Surgical History: No date: APPENDECTOMY No date: bowel obstruction 09/03/2020: ESOPHAGOGASTRODUODENOSCOPY (EGD) WITH PROPOFOL; N/A     Comment:  Procedure: ESOPHAGOGASTRODUODENOSCOPY (EGD) WITH               PROPOFOL;  Surgeon: Toney Reil, MD;  Location:               [facility] ENDOSCOPY;  Service: Gastroenterology;  Laterality:               N/A; 05/15/2019: EXTRACORPOREAL SHOCK WAVE LITHOTRIPSY; Right     Comment:  Procedure: EXTRACORPOREAL SHOCK WAVE LITHOTRIPSY (ESWL);              Surgeon: Malen Gauze, MD;  Location: [facility] ORS;                Service: Urology;  Laterality: Right;  BMI    Body Mass Index: 23.29 kg/m      Reproductive/Obstetrics negative OB ROS                             Anesthesia  Physical Anesthesia Plan  ASA: III  Anesthesia Plan: General   Post-op Pain Management:    Induction:   PONV Risk Score and Plan: Propofol infusion and TIVA  Airway Management Planned: Simple Face Mask  Additional Equipment:   Intra-op Plan:   Post-operative Plan:   Informed Consent: I have reviewed the patients History and Physical, chart, labs and discussed the procedure including the risks, benefits and alternatives for the proposed anesthesia with the patient or authorized representative who has indicated his/her understanding and acceptance.     Dental Advisory Given  Plan Discussed with: Anesthesiologist, CRNA and Surgeon  Anesthesia Plan Comments:         Anesthesia Quick Evaluation

## 2020-09-07 NOTE — Anesthesia Postprocedure Evaluation (Signed)
Anesthesia Post Note  Patient: [place]  Procedure(s) Performed: COLONOSCOPY (N/A )  Patient location during evaluation: PACU Anesthesia Type: General Level of consciousness: awake and alert Pain management: pain level controlled Vital Signs Assessment: post-procedure vital signs reviewed and stable Respiratory status: spontaneous breathing, nonlabored ventilation and respiratory function stable Cardiovascular status: blood pressure returned to baseline and stable Postop Assessment: no apparent nausea or vomiting Anesthetic complications: no   No complications documented.   Last Vitals:  Vitals:   09/07/20 1903 09/07/20 1941  BP: 99/74 (!) 109/93  Pulse: 81 94  Resp: 16 20  Temp: 36.7 C 37 C  SpO2:  99%    Last Pain:  Vitals:   09/07/20 1941  TempSrc: Oral  PainSc:                  Karleen Hampshire

## 2020-09-08 ENCOUNTER — Encounter
Admission: RE | Disposition: A | Discharge status: Home / Self Care | Payer: Self-pay | Source: Home / Self Care | Attending: Internal Medicine

## 2020-09-08 ENCOUNTER — Encounter: Payer: Self-pay | Admitting: Gastroenterology

## 2020-09-08 DIAGNOSIS — K921 Melena: Secondary | ICD-10-CM

## 2020-09-08 DIAGNOSIS — K625 Hemorrhage of anus and rectum: Secondary | ICD-10-CM | POA: Diagnosis not present

## 2020-09-08 DIAGNOSIS — K3182 Dieulafoy lesion (hemorrhagic) of stomach and duodenum: Secondary | ICD-10-CM | POA: Diagnosis not present

## 2020-09-08 DIAGNOSIS — D62 Acute posthemorrhagic anemia: Secondary | ICD-10-CM | POA: Diagnosis not present

## 2020-09-08 LAB — CBC
HCT: 28.3 % — ABNORMAL LOW (ref 39.0–52.0)
Hemoglobin: 9.7 g/dL — ABNORMAL LOW (ref 13.0–17.0)
MCH: 31.2 pg (ref 26.0–34.0)
MCHC: 34.3 g/dL (ref 30.0–36.0)
MCV: 91 fL (ref 80.0–100.0)
Platelets: 182 10*3/uL (ref 150–400)
RBC: 3.11 MIL/uL — ABNORMAL LOW (ref 4.22–5.81)
RDW: 15.9 % — ABNORMAL HIGH (ref 11.5–15.5)
WBC: 10.7 10*3/uL — ABNORMAL HIGH (ref 4.0–10.5)
nRBC: 1 % — ABNORMAL HIGH (ref 0.0–0.2)

## 2020-09-08 LAB — HEMOGLOBIN AND HEMATOCRIT, BLOOD
HCT: 21.5 % — ABNORMAL LOW (ref 39.0–52.0)
HCT: 20.3 % — ABNORMAL LOW (ref 39.0–52.0)
HCT: 20.4 % — ABNORMAL LOW (ref 39.0–52.0)
Hemoglobin: 7.1 g/dL — ABNORMAL LOW (ref 13.0–17.0)
Hemoglobin: 6.8 g/dL — ABNORMAL LOW (ref 13.0–17.0)
Hemoglobin: 6.8 g/dL — ABNORMAL LOW (ref 13.0–17.0)

## 2020-09-08 LAB — PREPARE RBC (CROSSMATCH)

## 2020-09-08 SURGERY — IMAGING PROCEDURE, GI TRACT, INTRALUMINAL, VIA CAPSULE

## 2020-09-08 MED ORDER — SODIUM CHLORIDE 0.9% IV SOLUTION: Freq: Once | INTRAVENOUS | Status: AC

## 2020-09-08 MED ORDER — NICOTINE 14 MG/24HR TD PT24
14.0000 mg | MEDICATED_PATCH | Freq: Every day | TRANSDERMAL | Status: DC
  Administered 2020-09-08 – 2020-09-09 (×2): 14 mg via TRANSDERMAL
  Filled 2020-09-08 (×2): qty 1

## 2020-09-08 NOTE — Progress Notes (Signed)
PROGRESS NOTE    Mark [name]  JQB:[id number] DOB: [birth year] DOA: 09/06/2020 PCP: Marjie Skiff, NP   Brief Narrative: Taken from H&P Mark [name] is a 60 y.o. male with medical history significant for  hypertension, history of CVA, s/p multiple surgeries for SBO, nicotine dependencewho presented for an outpatient colonoscopy and complained of generalized weakness and fatigue.  Patient appeared very pale and a CBC was done and his hemoglobin returned at 6.0g/dl. His procedure was canceled and request was made for admission for further evaluation. Patient was recently discharged from the hospital 3 days ago after he was admitted for evaluation of acute blood loss anemia from suspected GI source of blood loss.  At that time patient's hemoglobin had dropped from 10.9g/dl to 4.0X/BD, he complained of having hematemesis as well as bright red blood per rectum.  He was transfused 1 unit of packed RBC and on discharge his hemoglobin was 7.8g/dl.  Patient has a baseline hemoglobin from November 2021 of 14 g/dl. Patient had an upper endoscopy prior to his discharge which showed normal duodenal bulb and second portion of the duodenum. Non-bleeding gastric ulcer with a clean ulcer base (Forrest Class III). Medium-sized hiatal hernia. Normal gastroesophageal junction and esophagus. He was discharged home and advised to come back for an outpatient colonoscopy where he was noted to have a hemoglobin of 6 g/dl prompting admission.  He states that since his discharge he has continued to have melena stools.  Patient received 2 units of PRBC, with improvement of hemoglobin to 8.4 and a subsequent drop to 7.5.  Tagged RBC scan was negative. Patient had EGD and colonoscopy which shows stomach and colon full of blood. A Dieulafay lesion was was found in stomach and clipped. Hemoglobin at 6.8 today, 2 more units of PRBC ordered.  Subjective: Patient is complaining of some abdominal pain.  No more  hematemesis and did not had any more bowel movement since yesterday.  Assessment & Plan:   Principal Problem:   Acute blood loss anemia Active Problems:   Anxiety and depression   Hypertension   Dieulafoy lesion of stomach  Acute blood loss anemia secondary to GI losses.  Hemoglobin increased to 8.4 with another drop to 7.5 with subsequent check.  Received 2 units of PRBC.  Tagged RBC scan was negative. Patient had EGD and colonoscopy which shows stomach and colon full of blood. A Dieulafay lesion was was found in stomach and clipped. Received total of 3 units of PRBC yesterday. No active bleeding today but hemoglobin at 6.8. -Ordered 2 more units of PRBC. -If continued to have obvious bleeding then he will need vascular consult per GI. -Continue to monitor hemoglobin  Hypertension.  Blood pressure within goal. -Keep holding home antihypertensives.  History of anxiety and depression.  No acute concern. -Holding Celexa as he is n.p.o.  Tobacco abuse. -Nicotine patch  Objective: Vitals:   09/08/20 1103 09/08/20 1134 09/08/20 1240 09/08/20 1437  BP: 112/74 107/79 103/77 111/68  Pulse: 74 68 72 81  Resp: 18 18 19 16   Temp: 99.1 F (37.3 C) 99.2 F (37.3 C) 98.9 F (37.2 C) 98.2 F (36.8 C)  TempSrc: Oral Oral Oral Oral  SpO2:   98% 100%  Weight:      Height:        Intake/Output Summary (Last 24 hours) at 09/08/2020 1445 Last data filed at 09/08/2020 1415 Gross per 24 hour  Intake 1853.33 ml  Output 900 ml  Net 953.33 ml  Filed Weights   09/06/20 1949 09/07/20 0238 09/08/20 0555  Weight: 69.3 kg 71.5 kg 72.1 kg    Examination:  General.  Well-developed gentleman, in no acute distress. Pulmonary.  Lungs clear bilaterally, normal respiratory effort. CV.  Regular rate and rhythm, no JVD, rub or murmur. Abdomen.  Soft, nontender, nondistended, BS positive. CNS.  Alert and oriented x3.  No focal neurologic deficit. Extremities.  No edema, no cyanosis, pulses  intact and symmetrical. Psychiatry.  Judgment and insight appears normal.   DVT prophylaxis: SCDs, Code Status: Full Family Communication: Discussed with patient Disposition Plan:  Status is: Inpatient  Remains inpatient appropriate because:Inpatient level of care appropriate due to severity of illness   Dispo: The patient is from: Home              Anticipated d/c is to: Home              Anticipated d/c date is: 1-2 days.  Can be discharged tomorrow if no more bleeding and hemoglobin remained stable.              Patient currently is not medically stable to d/c.   Difficult to place patient No   Consultants:   GI  Procedures:  Antimicrobials:   Data Reviewed: I have personally reviewed following labs and imaging studies  CBC: Recent Labs  Lab 09/01/20 1553 09/02/20 0758 09/02/20 2137 09/06/20 1100 09/06/20 2219 09/07/20 0441 09/07/20 1316 09/08/20 0050 09/08/20 0554 09/08/20 0755  WBC 10.3 8.3  --  12.2*  --   --   --   --   --   --   NEUTROABS 7.1*  --   --   --   --   --   --   --   --   --   HGB 10.9* 8.9*   < > 6.0*   < > 8.4* 7.5* 7.1* 6.8* 6.8*  HCT 32.8* 27.2*   < > 17.7*   < > 24.8* 22.3* 21.5* 20.3* 20.4*  MCV 97 97.8  --  94.1  --   --   --   --   --   --   PLT 340 311  --  166  --   --   --   --   --   --    < > = values in this interval not displayed.   Basic Metabolic Panel: Recent Labs  Lab 09/01/20 1607 09/02/20 0758 09/03/20 0357 09/06/20 1100 09/07/20 0441  NA 136 140 136 139 137  K 5.1 4.0 3.8 3.2* 3.1*  CL 103 107 108 106 103  CO2 20 24 22 23 22   GLUCOSE 100* 128* 97 90 74  BUN 37* 38* 22* 18 13  CREATININE 1.40* 1.17 1.02 1.04 0.97  CALCIUM 9.4 8.6* 8.0* 7.9* 7.7*  MG  --   --   --  2.0 1.8  PHOS  --   --   --  2.6  --    GFR: Estimated Creatinine Clearance: 81 mL/min (by C-G formula based on SCr of 0.97 mg/dL). Liver Function Tests: Recent Labs  Lab 09/01/20 1607 09/02/20 0758  AST 13 15  ALT 15 15  ALKPHOS 66 47   BILITOT 0.2 0.7  PROT 6.7 6.3*  ALBUMIN 4.3 3.5   Recent Labs  Lab 09/02/20 2137  LIPASE 21   No results for input(s): AMMONIA in the last 168 hours. Coagulation Profile: No results for input(s): INR, PROTIME in the last 168  hours. Cardiac Enzymes: No results for input(s): CKTOTAL, CKMB, CKMBINDEX, TROPONINI in the last 168 hours. BNP (last 3 results) No results for input(s): PROBNP in the last 8760 hours. HbA1C: No results for input(s): HGBA1C in the last 72 hours. CBG: No results for input(s): GLUCAP in the last 168 hours. Lipid Profile: No results for input(s): CHOL, HDL, LDLCALC, TRIG, CHOLHDL, LDLDIRECT in the last 72 hours. Thyroid Function Tests: No results for input(s): TSH, T4TOTAL, FREET4, T3FREE, THYROIDAB in the last 72 hours. Anemia Panel: No results for input(s): VITAMINB12, FOLATE, FERRITIN, TIBC, IRON, RETICCTPCT in the last 72 hours. Sepsis Labs: No results for input(s): PROCALCITON, LATICACIDVEN in the last 168 hours.  Recent Results (from the past 240 hour(s))  Microscopic Examination     Status: Abnormal   Collection Time: 09/01/20  3:46 PM   Urine  Result Value Ref Range Status   WBC, UA 6-10 (A) 0 - 5 /hpf Final   RBC 11-30 (A) 0 - 2 /hpf Final   Epithelial Cells (non renal) 0-10 0 - 10 /hpf Final   Bacteria, UA None seen None seen/Few Final  Novel Coronavirus, NAA ([facility])     Status: None   Collection Time: 09/01/20  4:35 PM   Specimen: Nasopharyngeal(NP) swabs in vial transport medium  Result Value Ref Range Status   SARS-CoV-2, NAA Not Detected Not Detected Final    Comment: This nucleic acid amplification test was developed and its performance characteristics determined by [employer]. Nucleic acid amplification tests include RT-PCR and TMA. This test has not been FDA cleared or approved. This test has been authorized by FDA under an Emergency Use Authorization (EUA). This test is only authorized for the duration of time the  declaration that circumstances exist justifying the authorization of the emergency use of in vitro diagnostic tests for detection of SARS-CoV-2 virus and/or diagnosis of COVID-19 infection under section 564(b)(1) of the Act, 21 U.S.C. 240XBD-5(H) (1), unless the authorization is terminated or revoked sooner. When diagnostic testing is negative, the possibility of a false negative result should be considered in the context of a patient's recent exposures and the presence of clinical signs and symptoms consistent with COVID-19. An individual without symptoms of COVID-19 and who is not shedding SARS-CoV-2 virus wo uld expect to have a negative (not detected) result in this assay.   SARS-COV-2, NAA 2 DAY TAT     Status: None   Collection Time: 09/01/20  4:35 PM  Result Value Ref Range Status   SARS-CoV-2, NAA 2 DAY TAT Performed  Final  SARS Coronavirus 2 by RT PCR (hospital order, performed in [facility] hospital lab) Nasopharyngeal Nasopharyngeal Swab     Status: None   Collection Time: 09/02/20 12:49 PM   Specimen: Nasopharyngeal Swab  Result Value Ref Range Status   SARS Coronavirus 2 NEGATIVE NEGATIVE Final    Comment: (NOTE) SARS-CoV-2 target nucleic acids are NOT DETECTED.  The SARS-CoV-2 RNA is generally detectable in upper and lower respiratory specimens during the acute phase of infection. The lowest concentration of SARS-CoV-2 viral copies this assay can detect is 250 copies / mL. A negative result does not preclude SARS-CoV-2 infection and should not be used as the sole basis for treatment or other patient management decisions.  A negative result may occur with improper specimen collection / handling, submission of specimen other than nasopharyngeal swab, presence of viral mutation(s) within the areas targeted by this assay, and inadequate number of viral copies (<250 copies / mL). A negative  result must be combined with clinical observations, patient history, and  epidemiological information.  Fact Sheet for Patients:   BoilerBrush.com.cy  Fact Sheet for Healthcare Providers: https://pope.com/  This test is not yet approved or  cleared by the [place] FDA and has been authorized for detection and/or diagnosis of SARS-CoV-2 by FDA under an Emergency Use Authorization (EUA).  This EUA will remain in effect (meaning this test can be used) for the duration of the COVID-19 declaration under Section 564(b)(1) of the Act, 21 U.S.C. section 360bbb-3(b)(1), unless the authorization is terminated or revoked sooner.  Performed at [facility], [address] [place], [place] [address]      Radiology Studies: NM GI Blood Loss  Result Date: 09/07/2020 CLINICAL DATA:  60 year old with anemia and evaluate for GI bleed. Non bleeding gastric ulcer seen on recent upper endoscopy. EXAM: NUCLEAR MEDICINE GASTROINTESTINAL BLEEDING SCAN TECHNIQUE: Sequential abdominal images were obtained following intravenous administration of Tc-51m labeled red blood cells. RADIOPHARMACEUTICALS:  23.72 mCi Tc-22m pertechnetate in-vitro labeled red cells. COMPARISON:  CT abdomen and pelvis 09/02/2020 FINDINGS: Expected uptake in the vascular structures. No evidence for GI bleeding. Expected uptake in the urinary bladder. IMPRESSION: Negative for GI bleeding. Electronically Signed   By: Richarda Overlie M.D.   On: 09/07/2020 13:12    Scheduled Meds: . feeding supplement  237 mL Oral BID BM  . nicotine  14 mg Transdermal Daily  . pantoprazole (PROTONIX) IV  40 mg Intravenous Q12H  . vitamin B-12  1,000 mcg Oral Daily   Continuous Infusions:    LOS: 2 days   Time spent: 35 minutes.  Arnetha Courser, MD [facility]  If 7PM-7AM, please contact night-coverage [email or web address]  09/08/2020, 2:45 PM   This record has been created using Conservation officer, historic buildings. Errors have been sought and corrected,but may not  always be located. Such creation errors do not reflect on the standard of care.

## 2020-09-08 NOTE — Progress Notes (Signed)
Mobility Specialist - Progress Note   09/08/20 1000  Mobility  Activity Contraindicated/medical hold  Mobility performed by Mobility specialist    Per chart review, pt with most recent HgB levels of 6.8, sitting outside safety guidelines for mobility. Will continue to monitor and attempt session when appropriate.    Filiberto Pinks Mobility Specialist 09/08/20, 10:38 AM

## 2020-09-08 NOTE — Progress Notes (Signed)
Mark Bouillon, MD [address], [address], [place], [place], [address] [address], [address], [place], [place], [address] Phone: [phone]  Fax: [phone]   Subjective: Patient's last known bowel movement was yesterday night around 9 PM which was small and red.  No further hematemesis or bowel movements today.  No episodes of emesis.  Patient denies abdominal pain.   Objective: Exam: Vital signs in last 24 hours: Vitals:   09/08/20 1240 09/08/20 1437 09/08/20 1503 09/08/20 1531  BP: 103/77 111/68 101/74 100/72  Pulse: 72 81 74 75  Resp: 19 16 17 15   Temp: 98.9 F (37.2 C) 98.2 F (36.8 C) 98.4 F (36.9 C) 98.5 F (36.9 C)  TempSrc: Oral Oral  Oral  SpO2: 98% 100% 100% 100%  Weight:      Height:       Weight change: 2.822 kg  Intake/Output Summary (Last 24 hours) at 09/08/2020 1557 Last data filed at 09/08/2020 1415 Gross per 24 hour  Intake 1853.33 ml  Output 900 ml  Net 953.33 ml    General: No acute distress, AAO x3 Abd: Soft, NT/ND, No HSM Skin: Warm, no rashes Neck: Supple, Trachea midline   Lab Results: Lab Results  Component Value Date   WBC 12.2 (H) 09/06/2020   HGB 6.8 (L) 09/08/2020   HCT 20.4 (L) 09/08/2020   MCV 94.1 09/06/2020   PLT 166 09/06/2020   Micro Results: Recent Results (from the past 240 hour(s))  Microscopic Examination     Status: Abnormal   Collection Time: 09/01/20  3:46 PM   Urine  Result Value Ref Range Status   WBC, UA 6-10 (A) 0 - 5 /hpf Final   RBC 11-30 (A) 0 - 2 /hpf Final   Epithelial Cells (non renal) 0-10 0 - 10 /hpf Final   Bacteria, UA None seen None seen/Few Final  Novel Coronavirus, NAA ([facility])     Status: None   Collection Time: 09/01/20  4:35 PM   Specimen: Nasopharyngeal(NP) swabs in vial transport medium  Result Value Ref Range Status   SARS-CoV-2, NAA Not Detected Not Detected Final    Comment: This nucleic acid amplification test was developed and its performance characteristics  determined by 09/03/20. Nucleic acid amplification tests include RT-PCR and TMA. This test has not been FDA cleared or approved. This test has been authorized by FDA under an Emergency Use Authorization (EUA). This test is only authorized for the duration of time the declaration that circumstances exist justifying the authorization of the emergency use of in vitro diagnostic tests for detection of SARS-CoV-2 virus and/or diagnosis of COVID-19 infection under section 564(b)(1) of the Act, 21 U.S.C. [employer]) (1), unless the authorization is terminated or revoked sooner. When diagnostic testing is negative, the possibility of a false negative result should be considered in the context of a patient's recent exposures and the presence of clinical signs and symptoms consistent with COVID-19. An individual without symptoms of COVID-19 and who is not shedding SARS-CoV-2 virus wo uld expect to have a negative (not detected) result in this assay.   SARS-COV-2, NAA 2 DAY TAT     Status: None   Collection Time: 09/01/20  4:35 PM  Result Value Ref Range Status   SARS-CoV-2, NAA 2 DAY TAT Performed  Final  SARS Coronavirus 2 by RT PCR (hospital order, performed in [facility] hospital lab) Nasopharyngeal Nasopharyngeal Swab     Status: None   Collection Time: 09/02/20 12:49 PM   Specimen: Nasopharyngeal  Swab  Result Value Ref Range Status   SARS Coronavirus 2 NEGATIVE NEGATIVE Final    Comment: (NOTE) SARS-CoV-2 target nucleic acids are NOT DETECTED.  The SARS-CoV-2 RNA is generally detectable in upper and lower respiratory specimens during the acute phase of infection. The lowest concentration of SARS-CoV-2 viral copies this assay can detect is 250 copies / mL. A negative result does not preclude SARS-CoV-2 infection and should not be used as the sole basis for treatment or other patient management decisions.  A negative result may occur with improper specimen collection /  handling, submission of specimen other than nasopharyngeal swab, presence of viral mutation(s) within the areas targeted by this assay, and inadequate number of viral copies (<250 copies / mL). A negative result must be combined with clinical observations, patient history, and epidemiological information.  Fact Sheet for Patients:   BoilerBrush.com.cy  Fact Sheet for Healthcare Providers: https://pope.com/  This test is not yet approved or  cleared by the [place] FDA and has been authorized for detection and/or diagnosis of SARS-CoV-2 by FDA under an Emergency Use Authorization (EUA).  This EUA will remain in effect (meaning this test can be used) for the duration of the COVID-19 declaration under Section 564(b)(1) of the Act, 21 U.S.C. section 360bbb-3(b)(1), unless the authorization is terminated or revoked sooner.  Performed at [facility], [address] [place], [place] [address]    Studies/Results: NM GI Blood Loss  Result Date: 09/07/2020 CLINICAL DATA:  60 year old with anemia and evaluate for GI bleed. Non bleeding gastric ulcer seen on recent upper endoscopy. EXAM: NUCLEAR MEDICINE GASTROINTESTINAL BLEEDING SCAN TECHNIQUE: Sequential abdominal images were obtained following intravenous administration of Tc-55m labeled red blood cells. RADIOPHARMACEUTICALS:  23.72 mCi Tc-44m pertechnetate in-vitro labeled red cells. COMPARISON:  CT abdomen and pelvis 09/02/2020 FINDINGS: Expected uptake in the vascular structures. No evidence for GI bleeding. Expected uptake in the urinary bladder. IMPRESSION: Negative for GI bleeding. Electronically Signed   By: Richarda Overlie M.D.   On: 09/07/2020 13:12   Medications:  Scheduled Meds: . feeding supplement  237 mL Oral BID BM  . nicotine  14 mg Transdermal Daily  . pantoprazole (PROTONIX) IV  40 mg Intravenous Q12H  . vitamin B-12  1,000 mcg Oral Daily   Continuous  Infusions: PRN Meds:.morphine injection, ondansetron **OR** ondansetron (ZOFRAN) IV   Assessment: Principal Problem:   Acute blood loss anemia Active Problems:   Anxiety and depression   Hypertension   Dieulafoy lesion of stomach    Plan: Patient's hemoglobin was low this morning but he has been hemodynamically stable Last known bowel movement was yesterday that consisted of small amount of red blood as documented under flowsheets  I have recommended to Dr. Nelson Chimes, that if patient has any other episodes of active bleeding, to consult vascular surgery immediately  Low hemoglobin this morning may be from his active bleeding yesterday given large amount of blood clots were removed from his stomach and hemoglobin was already 7.1 last night and was not replaced  PPI IV twice daily  Continue serial CBCs and transfuse PRN Avoid NSAIDs Maintain 2 large-bore IV lines Please page GI with any acute hemodynamic changes, or signs of active GI bleeding    LOS: 2 days   Mark Bouillon, MD 09/08/2020, 3:57 PM

## 2020-09-09 DIAGNOSIS — D62 Acute posthemorrhagic anemia: Secondary | ICD-10-CM | POA: Diagnosis not present

## 2020-09-09 LAB — TYPE AND SCREEN
ABO/RH(D): A NEG
Antibody Screen: NEGATIVE
Unit division: 0
Unit division: 0
Unit division: 0
Unit division: 0
Unit division: 0

## 2020-09-09 LAB — CBC
HCT: 30.2 % — ABNORMAL LOW (ref 39.0–52.0)
Hemoglobin: 10.4 g/dL — ABNORMAL LOW (ref 13.0–17.0)
MCH: 31.1 pg (ref 26.0–34.0)
MCHC: 34.4 g/dL (ref 30.0–36.0)
MCV: 90.4 fL (ref 80.0–100.0)
Platelets: 183 10*3/uL (ref 150–400)
RBC: 3.34 MIL/uL — ABNORMAL LOW (ref 4.22–5.81)
RDW: 16.5 % — ABNORMAL HIGH (ref 11.5–15.5)
WBC: 7.3 10*3/uL (ref 4.0–10.5)
nRBC: 0.3 % — ABNORMAL HIGH (ref 0.0–0.2)

## 2020-09-09 LAB — BPAM RBC
Blood Product Expiration Date: 202202092359
Blood Product Expiration Date: 202202092359
Blood Product Expiration Date: 202202092359
Blood Product Expiration Date: 202202102359
Blood Product Expiration Date: 202202112359
ISSUE DATE / TIME: 202201241520
ISSUE DATE / TIME: 202201241825
ISSUE DATE / TIME: 202201251845
ISSUE DATE / TIME: 202201261109
ISSUE DATE / TIME: 202201261424
Unit Type and Rh: 600
Unit Type and Rh: 600
Unit Type and Rh: 600
Unit Type and Rh: 600
Unit Type and Rh: 600

## 2020-09-09 LAB — HEMOGLOBIN AND HEMATOCRIT, BLOOD
HCT: 26.7 % — ABNORMAL LOW (ref 39.0–52.0)
HCT: 28.9 % — ABNORMAL LOW (ref 39.0–52.0)
Hemoglobin: 9.2 g/dL — ABNORMAL LOW (ref 13.0–17.0)
Hemoglobin: 10.1 g/dL — ABNORMAL LOW (ref 13.0–17.0)

## 2020-09-09 MED ORDER — OMEPRAZOLE 20 MG PO CPDR: 20.0000 mg | DELAYED_RELEASE_CAPSULE | Freq: Two times a day (BID) | ORAL | 3 refills | Status: DC

## 2020-09-09 MED ORDER — FERROUS SULFATE 325 (65 FE) MG PO TBEC: 325.0000 mg | DELAYED_RELEASE_TABLET | Freq: Two times a day (BID) | ORAL | 3 refills | Status: DC

## 2020-09-09 MED ORDER — CYANOCOBALAMIN 1000 MCG PO TABS: 1000.0000 ug | ORAL_TABLET | Freq: Every day | ORAL | 0 refills | Status: DC

## 2020-09-09 NOTE — Discharge Summary (Signed)
Physician Discharge Summary  Mark [name] OQH:[id number] DOB: 1960/11/07 DOA: 09/06/2020  PCP: Marjie Skiff, NP  Admit date: 09/06/2020 Discharge date: 09/09/2020  Admitted From: Home Disposition: Home  Recommendations for Outpatient Follow-up:  1. Follow up with PCP in 1-2 weeks 2. Follow-up with gastroenterology in 3 to 4 weeks 3. Please obtain BMP/CBC in one week 4. Please follow up on the following pending results: None  Home Health: No Equipment/Devices: None Discharge Condition: Stable CODE STATUS: Full Diet recommendation: Heart Healthy  Brief/Interim Summary: Mark Steidle Chappellis a 59 y.o.malewith medical history significant forhypertension, history of CVA, s/p multiple surgeries for SBO, nicotine dependencewho presentedfor an outpatient colonoscopy and complained of generalized weakness and fatigue. Patient appeared very pale and a CBC was done and his hemoglobin returned at 6.0g/dl. His procedure was canceled and request was made for admission for further evaluation. Patient was recently discharged from the hospital 3 days ago after he was admitted for evaluation of acute blood loss anemia from suspected GI source of blood loss.At that time patient's hemoglobin had dropped from 10.9g/dl to 5.4S/FK,CL complained of having hematemesis as well as bright red blood per rectum. He was transfused 1 unit of packed RBC and on discharge his hemoglobin was 7.8g/dl.Patient has a baseline hemoglobin from November 2021 of 14 g/dl. Patient had an upper endoscopy prior to his discharge which showednormal duodenal bulb and second portion of the duodenum. Non-bleeding gastric ulcer with a clean ulcer base (Forrest Class III). Medium-sized hiatal hernia. Normal gastroesophageal junction and esophagus. He was discharged home and advised to come back for an outpatient colonoscopy where he was noted to have a hemoglobin of6 g/dl prompting admission.He states that since his  discharge he has continued to have melena stools.  Patient had EGD and colonoscopy which shows stomach and colon full of blood. A Dieulafay lesion was was found in stomach and clipped.  He received total of 4 units of PRBC.  Hemoglobin was 10.1 on discharge. No more active bleeding.  He will continue with PPI twice daily for 8 to 12 weeks and follow-up with gastroenterology in 4 to 6 weeks for further recommendations. He was advised to avoid NSAID and alcohol.  He will continue with rest of his home medications and follow-up with his providers  Discharge Diagnoses:  Principal Problem:   Acute blood loss anemia Active Problems:   Anxiety and depression   Hypertension   Dieulafoy lesion of stomach   Discharge Instructions  Discharge Instructions    Diet - low sodium heart healthy   Complete by: As directed    Discharge instructions   Complete by: As directed    It was pleasure taking care of you. As we discussed please avoid all medications which include NSAID like aspirin, ibuprofen or Aleve. Continue taking your supplements to improve your blood levels. We increased the dose of Prilosec from once a day to twice daily. Please follow-up with gastroenterology in 2 to 4 weeks.   Increase activity slowly   Complete by: As directed      Allergies as of 09/09/2020      Reactions   Codeine Hives      Medication List    TAKE these medications   amLODipine 10 MG tablet Commonly known as: NORVASC TAKE 1 TABLET BY MOUTH EVERY DAY   aspirin EC 81 MG tablet Take 1 tablet (81 mg total) by mouth daily. Swallow whole.   citalopram 20 MG tablet Commonly known as: CELEXA TAKE 1 TABLET BY  MOUTH EVERY DAY   cyanocobalamin 1000 MCG tablet Take 1 tablet (1,000 mcg total) by mouth daily. Start taking on: September 10, 2020   cyclobenzaprine 10 MG tablet Commonly known as: FLEXERIL TAKE 1 TABLET BY MOUTH THREE TIMES A DAY AS NEEDED FOR MUSCLE SPASMS What changed: See the new  instructions.   ferrous sulfate 325 (65 FE) MG EC tablet Take 1 tablet (325 mg total) by mouth 2 (two) times daily.   metoprolol tartrate 25 MG tablet Commonly known as: LOPRESSOR TAKE 1 TABLET BY MOUTH TWICE A DAY   omeprazole 20 MG capsule Commonly known as: PRILOSEC Take 1 capsule (20 mg total) by mouth 2 (two) times daily before a meal. What changed: when to take this   ondansetron 4 MG tablet Commonly known as: ZOFRAN Take 1 tablet (4 mg total) by mouth every 8 (eight) hours as needed for nausea or vomiting.   pravastatin 20 MG tablet Commonly known as: PRAVACHOL TAKE 1 TABLET BY MOUTH EVERY DAY   sucralfate 1 g tablet Commonly known as: Carafate Take 1 tablet (1 g total) by mouth 4 (four) times daily -  with meals and at bedtime.   tamsulosin 0.4 MG Caps capsule Commonly known as: FLOMAX TAKE 1 CAPSULE BY MOUTH EVERY DAY       Follow-up Information    Marjie Skiff, NP. Schedule an appointment as soon as possible for a visit.   Specialty: Nurse Practitioner Contact information: [address] [place] [address] [phone]        Gabriel Cirri, NP .   Specialties: Nurse Practitioner, Family Medicine Contact information: [address] [address] [phone]        Pasty Spillers, MD. Schedule an appointment as soon as possible for a visit.   Specialty: Gastroenterology Contact information: [address] [place] [address] [phone]              Allergies  Allergen Reactions  . Codeine Hives    Consultations:  Gastroenterology  Procedures/Studies: NM GI Blood Loss  Result Date: 09/07/2020 CLINICAL DATA:  60 year old with anemia and evaluate for GI bleed. Non bleeding gastric ulcer seen on recent upper endoscopy. EXAM: NUCLEAR MEDICINE GASTROINTESTINAL BLEEDING SCAN TECHNIQUE: Sequential abdominal images were obtained following intravenous administration of Tc-68m labeled red blood cells. RADIOPHARMACEUTICALS:   23.72 mCi Tc-60m pertechnetate in-vitro labeled red cells. COMPARISON:  CT abdomen and pelvis 09/02/2020 FINDINGS: Expected uptake in the vascular structures. No evidence for GI bleeding. Expected uptake in the urinary bladder. IMPRESSION: Negative for GI bleeding. Electronically Signed   By: Richarda Overlie M.D.   On: 09/07/2020 13:12   CT ABDOMEN PELVIS W CONTRAST  Result Date: 09/02/2020 CLINICAL DATA:  Abdominal pain EXAM: CT ABDOMEN AND PELVIS WITH CONTRAST TECHNIQUE: Multidetector CT imaging of the abdomen and pelvis was performed using the standard protocol following bolus administration of intravenous contrast. CONTRAST:  OMNIPAQUE IOHEXOL 300 MG/ML  SOLN COMPARISON:  05/08/2019 FINDINGS: Lower chest: No acute abnormality. Hepatobiliary: No focal liver abnormality. Cholecystectomy. Intra and extrahepatic biliary dilatation without evidence of obstructing stone at least partially due to cholecystectomy state. Pancreas: Slightly increased dilatation of the main pancreatic duct within the head. Otherwise unremarkable. Spleen: Stable appearance. Adrenals/Urinary Tract: Adrenals are unremarkable. 3 mm nonobstructing calculus at the lower pole of the right kidney. Too small to characterize low-attenuation lesions of the left kidney. Persistent moderate right hydronephrosis. There is also right hydroureter now extending to the level of the mid ureter  where there is an obstructing 7 mm calculus. Stomach/Bowel: Stomach is within normal limits apart from small hiatal hernia. Bowel is normal in caliber. Appendix is likely surgically absent. Vascular/Lymphatic: Aortic atherosclerosis. No enlarged lymph nodes identified. Reproductive: Stable appearance of the prostate. Other: No ascites.  No abdominal wall hernia. Musculoskeletal: Lower lumbar spine degenerative changes. No acute osseous abnormality. IMPRESSION: 7 mm obstructing calculus of the mid right ureter with proximal hydroureteronephrosis. Decreased right  renal stone burden since 2020 with remaining 3 mm nonobstructing stone at the lower pole. Increased intra and extrahepatic biliary dilatation. Slightly increased dilatation of the main pancreatic duct within the head. No obstructing ampullary lesion identified on this study. MRI/MRCP recommended as an outpatient for further evaluation. Electronically Signed   By: Guadlupe Spanish M.D.   On: 09/02/2020 15:40     Subjective: Patient was seen and examined today.  No new complaint.  Denies any more hematemesis or melena.  Discharge Exam: Vitals:   09/09/20 0718 09/09/20 1143  BP: 139/83 124/82  Pulse: 80 78  Resp: 17 18  Temp: 98.4 F (36.9 C) 99.2 F (37.3 C)  SpO2: 98% 98%   Vitals:   09/08/20 1924 09/09/20 0511 09/09/20 0718 09/09/20 1143  BP: (!) 130/93 135/88 139/83 124/82  Pulse: 70 69 80 78  Resp: 18 18 17 18   Temp: 98.2 F (36.8 C) 98.5 F (36.9 C) 98.4 F (36.9 C) 99.2 F (37.3 C)  TempSrc: Oral Oral Oral Oral  SpO2: 98% 96% 98% 98%  Weight:  71.8 kg    Height:        General: Pt is alert, awake, not in acute distress Cardiovascular: RRR, S1/S2 +, no rubs, no gallops Respiratory: CTA bilaterally, no wheezing, no rhonchi Abdominal: Soft, NT, ND, bowel sounds + Extremities: no edema, no cyanosis   The results of significant diagnostics from this hospitalization (including imaging, microbiology, ancillary and laboratory) are listed below for reference.    Microbiology: Recent Results (from the past 240 hour(s))  Microscopic Examination     Status: Abnormal   Collection Time: 09/01/20  3:46 PM   Urine  Result Value Ref Range Status   WBC, UA 6-10 (A) 0 - 5 /hpf Final   RBC 11-30 (A) 0 - 2 /hpf Final   Epithelial Cells (non renal) 0-10 0 - 10 /hpf Final   Bacteria, UA None seen None seen/Few Final  Novel Coronavirus, NAA ([facility])     Status: None   Collection Time: 09/01/20  4:35 PM   Specimen: Nasopharyngeal(NP) swabs in vial transport medium  Result Value Ref  Range Status   SARS-CoV-2, NAA Not Detected Not Detected Final    Comment: This nucleic acid amplification test was developed and its performance characteristics determined by 09/03/20. Nucleic acid amplification tests include RT-PCR and TMA. This test has not been FDA cleared or approved. This test has been authorized by FDA under an Emergency Use Authorization (EUA). This test is only authorized for the duration of time the declaration that circumstances exist justifying the authorization of the emergency use of in vitro diagnostic tests for detection of SARS-CoV-2 virus and/or diagnosis of COVID-19 infection under section 564(b)(1) of the Act, 21 U.S.C. [employer]) (1), unless the authorization is terminated or revoked sooner. When diagnostic testing is negative, the possibility of a false negative result should be considered in the context of a patient's recent exposures and the presence of clinical signs and symptoms consistent with COVID-19. An individual without symptoms of COVID-19 and  who is not shedding SARS-CoV-2 virus wo uld expect to have a negative (not detected) result in this assay.   SARS-COV-2, NAA 2 DAY TAT     Status: None   Collection Time: 09/01/20  4:35 PM  Result Value Ref Range Status   SARS-CoV-2, NAA 2 DAY TAT Performed  Final  SARS Coronavirus 2 by RT PCR (hospital order, performed in [facility] hospital lab) Nasopharyngeal Nasopharyngeal Swab     Status: None   Collection Time: 09/02/20 12:49 PM   Specimen: Nasopharyngeal Swab  Result Value Ref Range Status   SARS Coronavirus 2 NEGATIVE NEGATIVE Final    Comment: (NOTE) SARS-CoV-2 target nucleic acids are NOT DETECTED.  The SARS-CoV-2 RNA is generally detectable in upper and lower respiratory specimens during the acute phase of infection. The lowest concentration of SARS-CoV-2 viral copies this assay can detect is 250 copies / mL. A negative result does not preclude SARS-CoV-2  infection and should not be used as the sole basis for treatment or other patient management decisions.  A negative result may occur with improper specimen collection / handling, submission of specimen other than nasopharyngeal swab, presence of viral mutation(s) within the areas targeted by this assay, and inadequate number of viral copies (<250 copies / mL). A negative result must be combined with clinical observations, patient history, and epidemiological information.  Fact Sheet for Patients:   BoilerBrush.com.cy  Fact Sheet for Healthcare Providers: https://pope.com/  This test is not yet approved or  cleared by the [place] FDA and has been authorized for detection and/or diagnosis of SARS-CoV-2 by FDA under an Emergency Use Authorization (EUA).  This EUA will remain in effect (meaning this test can be used) for the duration of the COVID-19 declaration under Section 564(b)(1) of the Act, 21 U.S.C. section 360bbb-3(b)(1), unless the authorization is terminated or revoked sooner.  Performed at [facility], [address] [place], [place] [address]      Labs: BNP (last 3 results) No results for input(s): BNP in the last 8760 hours. Basic Metabolic Panel: Recent Labs  Lab 09/03/20 0357 09/06/20 1100 09/07/20 0441  NA 136 139 137  K 3.8 3.2* 3.1*  CL 108 106 103  CO2 22 23 22   GLUCOSE 97 90 74  BUN 22* 18 13  CREATININE 1.02 1.04 0.97  CALCIUM 8.0* 7.9* 7.7*  MG  --  2.0 1.8  PHOS  --  2.6  --    Liver Function Tests: No results for input(s): AST, ALT, ALKPHOS, BILITOT, PROT, ALBUMIN in the last 168 hours. Recent Labs  Lab 09/02/20 2137  LIPASE 21   No results for input(s): AMMONIA in the last 168 hours. CBC: Recent Labs  Lab 09/06/20 1100 09/06/20 2219 09/08/20 0755 09/08/20 2009 09/09/20 0129 09/09/20 0749 09/09/20 0906  WBC 12.2*  --   --  10.7*  --   --  7.3  HGB 6.0*   < > 6.8*  9.7* 9.2* 10.1* 10.4*  HCT 17.7*   < > 20.4* 28.3* 26.7* 28.9* 30.2*  MCV 94.1  --   --  91.0  --   --  90.4  PLT 166  --   --  182  --   --  183   < > = values in this interval not displayed.   Cardiac Enzymes: No results for input(s): CKTOTAL, CKMB, CKMBINDEX, TROPONINI in the last 168 hours. BNP: Invalid input(s): POCBNP CBG: No results for input(s): GLUCAP in the last 168 hours. D-Dimer  No results for input(s): DDIMER in the last 72 hours. Hgb A1c No results for input(s): HGBA1C in the last 72 hours. Lipid Profile No results for input(s): CHOL, HDL, LDLCALC, TRIG, CHOLHDL, LDLDIRECT in the last 72 hours. Thyroid function studies No results for input(s): TSH, T4TOTAL, T3FREE, THYROIDAB in the last 72 hours.  Invalid input(s): FREET3 Anemia work up No results for input(s): VITAMINB12, FOLATE, FERRITIN, TIBC, IRON, RETICCTPCT in the last 72 hours. Urinalysis    Component Value Date/Time   COLORURINE YELLOW (A) 07/04/2017 0043   APPEARANCEUR Clear 09/01/2020 1546   LABSPEC 1.006 07/04/2017 0043   LABSPEC 1.008 09/12/2011 1930   PHURINE 5.0 07/04/2017 0043   GLUCOSEU Negative 09/01/2020 1546   GLUCOSEU Negative 09/12/2011 1930   HGBUR NEGATIVE 07/04/2017 0043   BILIRUBINUR Negative 09/01/2020 1546   BILIRUBINUR Negative 09/12/2011 1930   KETONESUR NEGATIVE 07/04/2017 0043   PROTEINUR Negative 09/01/2020 1546   PROTEINUR NEGATIVE 07/04/2017 0043   NITRITE Negative 09/01/2020 1546   NITRITE NEGATIVE 07/04/2017 0043   LEUKOCYTESUR Trace (A) 09/01/2020 1546   LEUKOCYTESUR Negative 09/12/2011 1930   Sepsis Labs Invalid input(s): PROCALCITONIN,  WBC,  LACTICIDVEN Microbiology Recent Results (from the past 240 hour(s))  Microscopic Examination     Status: Abnormal   Collection Time: 09/01/20  3:46 PM   Urine  Result Value Ref Range Status   WBC, UA 6-10 (A) 0 - 5 /hpf Final   RBC 11-30 (A) 0 - 2 /hpf Final   Epithelial Cells (non renal) 0-10 0 - 10 /hpf Final    Bacteria, UA None seen None seen/Few Final  Novel Coronavirus, NAA ([facility])     Status: None   Collection Time: 09/01/20  4:35 PM   Specimen: Nasopharyngeal(NP) swabs in vial transport medium  Result Value Ref Range Status   SARS-CoV-2, NAA Not Detected Not Detected Final    Comment: This nucleic acid amplification test was developed and its performance characteristics determined by [facility]. Nucleic acid amplification tests include RT-PCR and TMA. This test has not been FDA cleared or approved. This test has been authorized by FDA under an Emergency Use Authorization (EUA). This test is only authorized for the duration of time the declaration that circumstances exist justifying the authorization of the emergency use of in vitro diagnostic tests for detection of SARS-CoV-2 virus and/or diagnosis of COVID-19 infection under section 564(b)(1) of the Act, 21 U.S.C. 960AVW-0(J360bbb-3(b) (1), unless the authorization is terminated or revoked sooner. When diagnostic testing is negative, the possibility of a false negative result should be considered in the context of a patient's recent exposures and the presence of clinical signs and symptoms consistent with COVID-19. An individual without symptoms of COVID-19 and who is not shedding SARS-CoV-2 virus wo uld expect to have a negative (not detected) result in this assay.   SARS-COV-2, NAA 2 DAY TAT     Status: None   Collection Time: 09/01/20  4:35 PM  Result Value Ref Range Status   SARS-CoV-2, NAA 2 DAY TAT Performed  Final  SARS Coronavirus 2 by RT PCR (hospital order, [facility]) Nasopharyngeal Nasopharyngeal Swab     Status: None   Collection Time: 09/02/20 12:49 PM   Specimen: Nasopharyngeal Swab  Result Value Ref Range Status   SARS Coronavirus 2 NEGATIVE NEGATIVE Final    Comment: (NOTE) SARS-CoV-2 target nucleic acids are NOT DETECTED.  The SARS-CoV-2 RNA is generally detectable in upper and  lower respiratory specimens during the acute phase  of infection. The lowest concentration of SARS-CoV-2 viral copies this assay can detect is 250 copies / mL. A negative result does not preclude SARS-CoV-2 infection and should not be used as the sole basis for treatment or other patient management decisions.  A negative result may occur with improper specimen collection / handling, submission of specimen other than nasopharyngeal swab, presence of viral mutation(s) within the areas targeted by this assay, and inadequate number of viral copies (<250 copies / mL). A negative result must be combined with clinical observations, patient history, and epidemiological information.  Fact Sheet for Patients:   BoilerBrush.com.cy  Fact Sheet for Healthcare Providers: https://pope.com/  This test is not yet approved or  cleared by the [place] FDA and has been authorized for detection and/or diagnosis of SARS-CoV-2 by FDA under an Emergency Use Authorization (EUA).  This EUA will remain in effect (meaning this test can be used) for the duration of the COVID-19 declaration under Section 564(b)(1) of the Act, 21 U.S.C. section 360bbb-3(b)(1), unless the authorization is terminated or revoked sooner.  Performed at [facility], [address] [place], [place] [address]     Time coordinating discharge: Over 30 minutes  SIGNED:  Arnetha Courser, MD  [facility] 09/09/2020, 12:22 PM  If 7PM-7AM, please contact night-coverage [email or web address]  This record has been created using Conservation officer, historic buildings. Errors have been sought and corrected,but may not always be located. Such creation errors do not reflect on the standard of care.

## 2020-09-09 NOTE — Progress Notes (Signed)
Mobility Specialist - Progress Note   09/09/20 1130  Mobility  Activity Ambulated in room;Ambulated in hall  Level of Assistance Independent  Assistive Device None  Distance Ambulated (ft) 350 ft  Mobility Response Tolerated well  Mobility performed by Mobility specialist  $Mobility charge 1 Mobility    Pre-mobility: 80 HR, 99% SpO2 Post-mobility: 95 HR, 97% SpO2   Pt laying in bed upon arrival. Pt very agreeable to session. Pt independent. Pt ambulated 350' total in room and hallway. No LOB noted. No c/o pain or SOB. Pt states "I'm ready to go home". Overall, pt tolerated session very well. Pt left laying in bed w/ all needs placed in reach.      Atarah Cadogan Mobility Specialist  09/09/20, 11:32 AM

## 2020-09-09 NOTE — Progress Notes (Signed)
Melodie Bouillon, MD [address], [address], [place], [place], [address] [address], [address], [place], [place], [address] Phone: [phone]  Fax: [phone]   Subjective: Patient denies any further episodes of hematochezia. No abdominal pain. No nausea or vomiting. Flowsheets do not document any further episodes of active bleeding in over 24 hours   Objective: Exam: Vital signs in last 24 hours: Vitals:   09/08/20 1924 09/09/20 0511 09/09/20 0718 09/09/20 1143  BP: (!) 130/93 135/88 139/83 124/82  Pulse: 70 69 80 78  Resp: 18 18 17 18   Temp: 98.2 F (36.8 C) 98.5 F (36.9 C) 98.4 F (36.9 C) 99.2 F (37.3 C)  TempSrc: Oral Oral Oral Oral  SpO2: 98% 96% 98% 98%  Weight:  71.8 kg    Height:       Weight change: -0.322 kg  Intake/Output Summary (Last 24 hours) at 09/09/2020 1305 Last data filed at 09/09/2020 09/11/2020 Gross per 24 hour  Intake 1623 ml  Output 500 ml  Net 1123 ml    General: No acute distress, AAO x3 Abd: Soft, NT/ND, No HSM Skin: Warm, no rashes Neck: Supple, Trachea midline   Lab Results: Lab Results  Component Value Date   WBC 7.3 09/09/2020   HGB 10.4 (L) 09/09/2020   HCT 30.2 (L) 09/09/2020   MCV 90.4 09/09/2020   PLT 183 09/09/2020   Micro Results: Recent Results (from the past 240 hour(s))  Microscopic Examination     Status: Abnormal   Collection Time: 09/01/20  3:46 PM   Urine  Result Value Ref Range Status   WBC, UA 6-10 (A) 0 - 5 /hpf Final   RBC 11-30 (A) 0 - 2 /hpf Final   Epithelial Cells (non renal) 0-10 0 - 10 /hpf Final   Bacteria, UA None seen None seen/Few Final  Novel Coronavirus, NAA ([facility])     Status: None   Collection Time: 09/01/20  4:35 PM   Specimen: Nasopharyngeal(NP) swabs in vial transport medium  Result Value Ref Range Status   SARS-CoV-2, NAA Not Detected Not Detected Final    Comment: This nucleic acid amplification test was developed and its performance characteristics determined by 09/03/20. Nucleic acid amplification tests include RT-PCR and TMA. This test has not been FDA cleared or approved. This test has been authorized by FDA under an Emergency Use Authorization (EUA). This test is only authorized for the duration of time the declaration that circumstances exist justifying the authorization of the emergency use of in vitro diagnostic tests for detection of SARS-CoV-2 virus and/or diagnosis of COVID-19 infection under section 564(b)(1) of the Act, 21 U.S.C. [employer]) (1), unless the authorization is terminated or revoked sooner. When diagnostic testing is negative, the possibility of a false negative result should be considered in the context of a patient's recent exposures and the presence of clinical signs and symptoms consistent with COVID-19. An individual without symptoms of COVID-19 and who is not shedding SARS-CoV-2 virus wo uld expect to have a negative (not detected) result in this assay.   SARS-COV-2, NAA 2 DAY TAT     Status: None   Collection Time: 09/01/20  4:35 PM  Result Value Ref Range Status   SARS-CoV-2, NAA 2 DAY TAT Performed  Final  SARS Coronavirus 2 by RT PCR (hospital order, performed in [facility] hospital lab) Nasopharyngeal Nasopharyngeal Swab     Status: None   Collection Time: 09/02/20 12:49 PM   Specimen: Nasopharyngeal Swab  Result Value Ref  Range Status   SARS Coronavirus 2 NEGATIVE NEGATIVE Final    Comment: (NOTE) SARS-CoV-2 target nucleic acids are NOT DETECTED.  The SARS-CoV-2 RNA is generally detectable in upper and lower respiratory specimens during the acute phase of infection. The lowest concentration of SARS-CoV-2 viral copies this assay can detect is 250 copies / mL. A negative result does not preclude SARS-CoV-2 infection and should not be used as the sole basis for treatment or other patient management decisions.  A negative result may occur with improper specimen collection / handling, submission of  specimen other than nasopharyngeal swab, presence of viral mutation(s) within the areas targeted by this assay, and inadequate number of viral copies (<250 copies / mL). A negative result must be combined with clinical observations, patient history, and epidemiological information.  Fact Sheet for Patients:   BoilerBrush.com.cy  Fact Sheet for Healthcare Providers: https://pope.com/  This test is not yet approved or  cleared by the [place] FDA and has been authorized for detection and/or diagnosis of SARS-CoV-2 by FDA under an Emergency Use Authorization (EUA).  This EUA will remain in effect (meaning this test can be used) for the duration of the COVID-19 declaration under Section 564(b)(1) of the Act, 21 U.S.C. section 360bbb-3(b)(1), unless the authorization is terminated or revoked sooner.  Performed at [facility], [address], [place], [place] [address]    Studies/Results: No results found. Medications:  Scheduled Meds:  feeding supplement  237 mL Oral BID BM   nicotine  14 mg Transdermal Daily   pantoprazole (PROTONIX) IV  40 mg Intravenous Q12H   vitamin B-12  1,000 mcg Oral Daily   Continuous Infusions:  PRN Meds:.morphine injection, ondansetron **OR** ondansetron (ZOFRAN) IV   Assessment: Principal Problem:   Acute blood loss anemia Active Problems:   Anxiety and depression   Hypertension   Dieulafoy lesion of stomach    Plan: Hemoglobin above 10 this morning No further episodes of active bleeding Patient should follow-up in GI clinic closely after discharge and he is agreeable to the same  Continue PPI twice daily for 8 to 12 weeks     LOS: 3 days   Melodie Bouillon, MD 09/09/2020, 1:05 PM

## 2020-09-09 NOTE — Care Management Important Message (Signed)
Important Message  Patient Details  Name: Mark [name] MRN: [id number] Date of Birth: February 20, 1961   Medicare Important Message Given:  Yes     [name] 09/09/2020, 1:51 PM

## 2020-09-09 NOTE — Discharge Instructions (Signed)

## 2020-09-10 ENCOUNTER — Telehealth: Payer: Self-pay

## 2020-09-10 NOTE — Telephone Encounter (Signed)
Transition Care Management Unsuccessful Follow-up Telephone Call  Date of discharge and from where:  09/10/2019 [facility]  Attempts:  1st Attempt  Reason for unsuccessful TCM follow-up call:  No answer/busy

## 2020-09-15 ENCOUNTER — Telehealth: Payer: Self-pay | Admitting: General Practice

## 2020-09-15 ENCOUNTER — Ambulatory Visit (INDEPENDENT_AMBULATORY_CARE_PROVIDER_SITE_OTHER): Payer: Medicare HMO | Admitting: Nurse Practitioner

## 2020-09-15 ENCOUNTER — Other Ambulatory Visit: Payer: Self-pay | Admitting: Nurse Practitioner

## 2020-09-15 ENCOUNTER — Other Ambulatory Visit: Payer: Self-pay

## 2020-09-15 ENCOUNTER — Encounter: Payer: Self-pay | Admitting: Nurse Practitioner

## 2020-09-15 VITALS — BP 118/78 | HR 76 | Temp 98.2°F | Ht 66.1 in | Wt 154.2 lb

## 2020-09-15 DIAGNOSIS — J438 Other emphysema: Secondary | ICD-10-CM | POA: Diagnosis not present

## 2020-09-15 DIAGNOSIS — D62 Acute posthemorrhagic anemia: Secondary | ICD-10-CM

## 2020-09-15 DIAGNOSIS — I700 Atherosclerosis of aorta: Secondary | ICD-10-CM

## 2020-09-15 DIAGNOSIS — I10 Essential (primary) hypertension: Secondary | ICD-10-CM

## 2020-09-15 DIAGNOSIS — Z8673 Personal history of transient ischemic attack (TIA), and cerebral infarction without residual deficits: Secondary | ICD-10-CM

## 2020-09-15 DIAGNOSIS — F32A Depression, unspecified: Secondary | ICD-10-CM

## 2020-09-15 NOTE — Assessment & Plan Note (Signed)
Chronic, no current inhalers or symptoms.  Long time smoker.  Have recommended complete cessation.  Will plan on spirometry at upcoming visits.  Continue yearly lung CT CA screening.  Initiate inhaler regimen as needed.  CBC today.

## 2020-09-15 NOTE — Patient Instructions (Signed)

## 2020-09-15 NOTE — Telephone Encounter (Cosign Needed)
  Chronic Care Management   Note  09/15/2020 Name: Mark [name] MRN: [id number] DOB: 1961/02/25  Request from the pcp for help with transportation. The patient needs transportation in March.  Care guide referral placed for help with transportation needs for: an appointment with Dr. Maximino Greenland with GI at [facility] on 10/14/20 at 1:15 pm.  Follow up plan: No further follow up required: care guide will assist with transporation needs   Alto Denver RN, MSN, CCM Community Care Coordinator [place]  [facility] Mobile: [phone]

## 2020-09-15 NOTE — Progress Notes (Signed)
BP 118/78   Pulse 76   Temp 98.2 F (36.8 C) (Oral)   Ht 5' 6.1" (1.679 m)   Wt 154 lb 3.2 oz (69.9 kg)   SpO2 98%   BMI 24.81 kg/m    Subjective:    Patient ID: Mark [name], male    DOB: 23-Sep-1960, 60 y.o.   MRN: [id number]  HPI: Mark [name] is a 60 y.o. male  Chief Complaint  Patient presents with  . Hospitalization Follow-up    Anemia due to Blood loss, was given 6 pints blood, just released on 1/27. Patient states he is feeling better.   Transition of Care Hospital Follow up.  Admitted to hospital on 09/02/20 and then again on 09/06/20 for GI bleed and anemia.  He was initially seen in office on 09/01/20 for fatigue and was advised to go to ER.  He has history of drug abuse, last hospitalization for acute hepatitis in 2017. At that time hepatitis labs returned negative. He denies any drug, alcohol, or Tylenol use. In past he used cocaineand Percocet.Also has history of overdose, which at this time he denies use of. He denies abdominal pain, constipation, muscle pain, or distension.Reports decreased appetite, has not ate in 2 days -- is drinking sweet tea. States he "just doesn't want to eat", denies N&V with eating. Denies any depressed mood. States he is passing bowels once a day without difficulty, no straining. Is drinking fluids, drinking sweet tea, having one glass every couple hours. Does have history of having "part of my bowels removed" about 15 years ago before his stroke. Is a smoker since age 48, 1 PPD, not interested in quitting.  Last lung CT on 05/11/20 noting emphysema and aortic atherosclerosis.  Of note on review records:  --- history of short gut syndrome and right hemicolectomy (multiple surgeries)   He reports feeling better at this time, only mild fatigue.  No blood in stool, melena, constipation, diarrhea, N&V, SOB, CP.  "Recommendations for Outpatient Follow-up:  1. Follow up with PCP in 1-2 weeks 2. Follow-up with gastroenterology in  3 to 4 weeks 3. Please obtain BMP/CBC in one week 4. Please follow up on the following pending results: None  Home Health: No Equipment/Devices: None Discharge Condition: Stable CODE STATUS: Full Diet recommendation: Heart Healthy  Brief/Interim Summary: Mark [name] a 59 y.o.malewith medical history significant forhypertension, history of CVA, s/p multiple surgeries for SBO, nicotine dependencewho presentedfor an outpatient colonoscopy and complained of generalized weakness and fatigue. Patient appeared very pale and a CBC was done and his hemoglobin returned at 6.0g/dl. His procedure was canceled and request was made for admission for further evaluation. Patient was recently discharged from the hospital 3 days ago after he was admitted for evaluation of acute blood loss anemia from suspected GI source of blood loss.At that time patient's hemoglobin had dropped from 10.9g/dl to 7.8G/NF,AO complained of having hematemesis as well as bright red blood per rectum. He was transfused 1 unit of packed RBC and on discharge his hemoglobin was 7.8g/dl.Patient has a baseline hemoglobin from November 2021 of 14 g/dl. Patient had an upper endoscopy prior to his discharge which showednormal duodenal bulb and second portion of the duodenum. Non-bleeding gastric ulcer with a clean ulcer base (Forrest Class III). Medium-sized hiatal hernia. Normal gastroesophageal junction and esophagus. He was discharged home and advised to come back for an outpatient colonoscopy where he was noted to have a hemoglobin of6 g/dl prompting admission.He states that since his  discharge he has continued to have melena stools.  Patient had EGD and colonoscopy which shows stomach and colon full of blood. 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  He received total of 4 units of PRBC.  Hemoglobin was 10.1 on discharge. No more active bleeding.  He will continue with PPI twice daily for 8  to 12 weeks and follow-up with gastroenterology in 4 to 6 weeks for further recommendations. He was advised to avoid NSAID and alcohol.  He will continue with rest of his home medications and follow-up with his providers"  [facility] Physician: Dr. Nelson ChimesAmin D/C Date:  09/09/2020  Records Requested: 09/15/2020 Records Received: 09/15/2020 Records Reviewed: 09/15/2020  Diagnoses on Discharge: Acute blood loss anemia  Date of interactive Contact within 48 hours of discharge:  Contact was through: phone  Date of 7 day or 14 day face-to-face visit:    within 7 days  Outpatient Encounter Medications as of 09/15/2020  Medication Sig Note  . amLODipine (NORVASC) 10 MG tablet TAKE 1 TABLET BY MOUTH EVERY DAY   . aspirin EC 81 MG tablet Take 1 tablet (81 mg total) by mouth daily. Swallow whole.   . citalopram (CELEXA) 20 MG tablet TAKE 1 TABLET BY MOUTH EVERY DAY (Patient taking differently: Take 20 mg by mouth daily.)   . ferrous sulfate 325 (65 FE) MG EC tablet Take 1 tablet (325 mg total) by mouth 2 (two) times daily.   . metoprolol tartrate (LOPRESSOR) 25 MG tablet TAKE 1 TABLET BY MOUTH TWICE A DAY (Patient taking differently: Take 25 mg by mouth 2 (two) times daily.) 09/09/2020: Pt cannot recall exact last dose date and time.  Marland Kitchen. omeprazole (PRILOSEC) 20 MG capsule Take 1 capsule (20 mg total) by mouth 2 (two) times daily before a meal.   . ondansetron (ZOFRAN) 4 MG tablet Take 1 tablet (4 mg total) by mouth every 8 (eight) hours as needed for nausea or vomiting.   . pravastatin (PRAVACHOL) 20 MG tablet TAKE 1 TABLET BY MOUTH EVERY DAY   . sucralfate (CARAFATE) 1 g tablet Take 1 tablet (1 g total) by mouth 4 (four) times daily -  with meals and at bedtime.   . tamsulosin (FLOMAX) 0.4 MG CAPS capsule TAKE 1 CAPSULE BY MOUTH EVERY DAY (Patient taking differently: Take 0.4 mg by mouth daily.)   . vitamin B-12 1000 MCG tablet Take 1 tablet (1,000 mcg total) by mouth daily.   .  cyclobenzaprine (FLEXERIL) 10 MG tablet TAKE 1 TABLET BY MOUTH THREE TIMES A DAY AS NEEDED FOR MUSCLE SPASMS (Patient not taking: Reported on 09/15/2020)   . [DISCONTINUED] pravastatin (PRAVACHOL) 20 MG tablet TAKE 1 TABLET BY MOUTH EVERY DAY (Patient taking differently: Take 20 mg by mouth daily.)    No facility-administered encounter medications on file as of 09/15/2020.    Diagnostic Tests Reviewed/Disposition: Discharge CBC with some trend upwards -- H/H 10.4/30.2, MCV 90.4, PLT 183 -- BMP = K+ 3.1, CA+ 7.7  CT ABD/PELVIS FINDINGS: Lower chest: No acute abnormality.  Hepatobiliary: No focal liver abnormality. Cholecystectomy. Intra and extrahepatic biliary dilatation without evidence of obstructing stone at least partially due to cholecystectomy state.  Pancreas: Slightly increased dilatation of the main pancreatic duct within the head. Otherwise unremarkable.  Spleen: Stable appearance.  Adrenals/Urinary Tract: Adrenals are unremarkable. 3 mm nonobstructing calculus at the lower pole of the right kidney. Too small to characterize low-attenuation lesions of the left kidney. Persistent moderate right hydronephrosis. There is also  right hydroureter now extending to the level of the mid ureter where there is an obstructing 7 mm calculus.  Stomach/Bowel: Stomach is within normal limits apart from small hiatal hernia. Bowel is normal in caliber. Appendix is likely surgically absent.  Vascular/Lymphatic: Aortic atherosclerosis. No enlarged lymph nodes identified.  Reproductive: Stable appearance of the prostate.  Other: No ascites.  No abdominal wall hernia.  Musculoskeletal: Lower lumbar spine degenerative changes. No acute osseous abnormality.  IMPRESSION: 7 mm obstructing calculus of the mid right ureter with proximal hydroureteronephrosis.  Decreased right renal stone burden since 2020 with remaining 3 mm nonobstructing stone at the lower pole.  Increased  intra and extrahepatic biliary dilatation. Slightly increased dilatation of the main pancreatic duct within the head. No obstructing ampullary lesion identified on this study. MRI/MRCP recommended as an outpatient for further evaluation.   Electronically Signed   By: Guadlupe Spanish M.D.   On: 09/02/2020 15:40  Consults: GI  Discharge Instructions:  Follow-up with PCP and GI  Disease/illness Education: Educated patient today on GI bleed  Home Health/Community Services Discussions/Referrals: None  Establishment or re-establishment of referral orders for community resources: None  Discussion with other health care providers: Reviewed recent notes and procedures from hospital  Assessment and Support of treatment regimen adherence: Reviewed with patient today  Appointments Coordinated with:  Reviewed with patient today  Education for self-management, independent living, and ADLs:  Reviewed with patient today  Relevant past medical, surgical, family and social history reviewed and updated as indicated. Interim medical history since our last visit reviewed. Allergies and medications reviewed and updated.  Review of Systems  Constitutional: Positive for fatigue (mild, but improved). Negative for activity change, diaphoresis and fever.  Respiratory: Negative for cough, chest tightness, shortness of breath and wheezing.   Cardiovascular: Negative for chest pain, palpitations and leg swelling.  Gastrointestinal: Negative for abdominal distention, abdominal pain, blood in stool, constipation, diarrhea, nausea, rectal pain and vomiting.  Neurological: Negative for dizziness, syncope, weakness, light-headedness, numbness and headaches.  Psychiatric/Behavioral: Negative.     Per HPI unless specifically indicated above     Objective:    BP 118/78   Pulse 76   Temp 98.2 F (36.8 C) (Oral)   Ht 5' 6.1" (1.679 m)   Wt 154 lb 3.2 oz (69.9 kg)   SpO2 98%   BMI 24.81 kg/m   Wt  Readings from Last 3 Encounters:  09/15/20 154 lb 3.2 oz (69.9 kg)  09/09/20 158 lb 4.6 oz (71.8 kg)  09/03/20 151 lb 14.4 oz (68.9 kg)    Physical Exam Vitals and nursing note reviewed.  Constitutional:      General: He is awake. He is not in acute distress.    Appearance: He is well-developed and well-groomed. He is not ill-appearing or toxic-appearing.     Comments: In good spirits today, improved color and demeanor.  HENT:     Head: Normocephalic and atraumatic.     Right Ear: Hearing normal. No drainage.     Left Ear: Hearing normal. No drainage.  Eyes:     General: Lids are normal.        Right eye: No discharge.        Left eye: No discharge.     Conjunctiva/sclera: Conjunctivae normal.     Pupils: Pupils are equal, round, and reactive to light.  Neck:     Thyroid: No thyromegaly.     Vascular: No carotid bruit.  Cardiovascular:     Rate and  Rhythm: Normal rate and regular rhythm.     Heart sounds: Normal heart sounds, S1 normal and S2 normal. No murmur heard. No gallop.   Pulmonary:     Effort: Pulmonary effort is normal. No accessory muscle usage or respiratory distress.     Breath sounds: Normal breath sounds.  Abdominal:     General: Bowel sounds are normal.     Palpations: Abdomen is soft. There is no hepatomegaly or splenomegaly.     Tenderness: There is no abdominal tenderness.     Comments: Scarring present along abdomen and small outpouching to right lower abdominal scar similar to previous exams.  Musculoskeletal:        General: Normal range of motion.     Cervical back: Normal range of motion and neck supple.     Right lower leg: No edema.     Left lower leg: No edema.  Skin:    General: Skin is warm and dry.     Capillary Refill: Capillary refill takes less than 2 seconds.     Findings: No rash.  Neurological:     Mental Status: He is alert and oriented to person, place, and time.     Cranial Nerves: Cranial nerves are intact.     Motor: Motor  function is intact.     Coordination: Coordination is intact.     Gait: Gait is intact.     Deep Tendon Reflexes: Reflexes are normal and symmetric.     Reflex Scores:      Brachioradialis reflexes are 2+ on the right side and 2+ on the left side.      Patellar reflexes are 2+ on the right side and 2+ on the left side. Psychiatric:        Mood and Affect: Mood normal.        Behavior: Behavior normal. Behavior is cooperative.        Thought Content: Thought content normal.        Judgment: Judgment normal.     Results for orders placed or performed during the hospital encounter of 09/06/20  CBC  Result Value Ref Range   WBC 12.2 (H) 4.0 - 10.5 K/uL   RBC 1.88 (L) 4.22 - 5.81 MIL/uL   Hemoglobin 6.0 (L) 13.0 - 17.0 g/dL   HCT 19.3 (L) 79.0 - 24.0 %   MCV 94.1 80.0 - 100.0 fL   MCH 31.9 26.0 - 34.0 pg   MCHC 33.9 30.0 - 36.0 g/dL   RDW 97.3 (H) 53.2 - 99.2 %   Platelets 166 150 - 400 K/uL   nRBC 0.4 (H) 0.0 - 0.2 %  Basic metabolic panel  Result Value Ref Range   Sodium 139 135 - 145 mmol/L   Potassium 3.2 (L) 3.5 - 5.1 mmol/L   Chloride 106 98 - 111 mmol/L   CO2 23 22 - 32 mmol/L   Glucose, Bld 90 70 - 99 mg/dL   BUN 18 6 - 20 mg/dL   Creatinine, Ser 4.26 0.61 - 1.24 mg/dL   Calcium 7.9 (L) 8.9 - 10.3 mg/dL   GFR, Estimated >83 >41 mL/min   Anion gap 10 5 - 15  Magnesium  Result Value Ref Range   Magnesium 2.0 1.7 - 2.4 mg/dL  Phosphorus  Result Value Ref Range   Phosphorus 2.6 2.5 - 4.6 mg/dL  Hemoglobin and hematocrit, blood  Result Value Ref Range   Hemoglobin 7.7 (L) 13.0 - 17.0 g/dL   HCT 96.2 (L) 22.9 -  52.0 %  Hemoglobin and hematocrit, blood  Result Value Ref Range   Hemoglobin 8.4 (L) 13.0 - 17.0 g/dL   HCT 31.5 (L) 17.6 - 16.0 %  Basic metabolic panel  Result Value Ref Range   Sodium 137 135 - 145 mmol/L   Potassium 3.1 (L) 3.5 - 5.1 mmol/L   Chloride 103 98 - 111 mmol/L   CO2 22 22 - 32 mmol/L   Glucose, Bld 74 70 - 99 mg/dL   BUN 13 6 - 20 mg/dL    Creatinine, Ser 7.37 0.61 - 1.24 mg/dL   Calcium 7.7 (L) 8.9 - 10.3 mg/dL   GFR, Estimated >10 >62 mL/min   Anion gap 12 5 - 15  Hemoglobin and hematocrit, blood  Result Value Ref Range   Hemoglobin 7.5 (L) 13.0 - 17.0 g/dL   HCT 69.4 (L) 85.4 - 62.7 %  Magnesium  Result Value Ref Range   Magnesium 1.8 1.7 - 2.4 mg/dL  Hemoglobin and hematocrit, blood  Result Value Ref Range   Hemoglobin 6.8 (L) 13.0 - 17.0 g/dL   HCT 03.5 (L) 00.9 - 38.1 %  Hemoglobin and hematocrit, blood  Result Value Ref Range   Hemoglobin 6.8 (L) 13.0 - 17.0 g/dL   HCT 82.9 (L) 93.7 - 16.9 %  Hemoglobin and hematocrit, blood  Result Value Ref Range   Hemoglobin 7.1 (L) 13.0 - 17.0 g/dL   HCT 67.8 (L) 93.8 - 10.1 %  Hemoglobin and hematocrit, blood  Result Value Ref Range   Hemoglobin 9.2 (L) 13.0 - 17.0 g/dL   HCT 75.1 (L) 02.5 - 85.2 %  CBC  Result Value Ref Range   WBC 10.7 (H) 4.0 - 10.5 K/uL   RBC 3.11 (L) 4.22 - 5.81 MIL/uL   Hemoglobin 9.7 (L) 13.0 - 17.0 g/dL   HCT 77.8 (L) 24.2 - 35.3 %   MCV 91.0 80.0 - 100.0 fL   MCH 31.2 26.0 - 34.0 pg   MCHC 34.3 30.0 - 36.0 g/dL   RDW 61.4 (H) 43.1 - 54.0 %   Platelets 182 150 - 400 K/uL   nRBC 1.0 (H) 0.0 - 0.2 %  Hemoglobin and hematocrit, blood  Result Value Ref Range   Hemoglobin 10.1 (L) 13.0 - 17.0 g/dL   HCT 08.6 (L) 76.1 - 95.0 %  CBC  Result Value Ref Range   WBC 7.3 4.0 - 10.5 K/uL   RBC 3.34 (L) 4.22 - 5.81 MIL/uL   Hemoglobin 10.4 (L) 13.0 - 17.0 g/dL   HCT 93.2 (L) 67.1 - 24.5 %   MCV 90.4 80.0 - 100.0 fL   MCH 31.1 26.0 - 34.0 pg   MCHC 34.4 30.0 - 36.0 g/dL   RDW 80.9 (H) 98.3 - 38.2 %   Platelets 183 150 - 400 K/uL   nRBC 0.3 (H) 0.0 - 0.2 %  Type and screen [facility]  Result Value Ref Range   ABO/RH(D) A NEG    Antibody Screen NEG    Sample Expiration 09/09/2020,2359    Unit Number [id number]    Blood Component Type RBC LR PHER1    Unit division 00    Status of Unit ISSUED,FINAL     Transfusion Status OK TO TRANSFUSE    Crossmatch Result Compatible    Unit Number [id number]    Blood Component Type RBC LR PHER2    Unit division 00    Status of Unit ISSUED,FINAL    Transfusion Status OK TO TRANSFUSE  Crossmatch Result Compatible    Unit Number [id number]    Blood Component Type RED CELLS,LR    Unit division 00    Status of Unit ISSUED,FINAL    Transfusion Status OK TO TRANSFUSE    Crossmatch Result Compatible    Unit Number [id number]    Blood Component Type RED CELLS,LR    Unit division 00    Status of Unit ISSUED,FINAL    Transfusion Status OK TO TRANSFUSE    Crossmatch Result Compatible    Unit Number [id number]    Blood Component Type RED CELLS,LR    Unit division 00    Status of Unit ISSUED,FINAL    Transfusion Status OK TO TRANSFUSE    Crossmatch Result      Compatible Performed at [facility], [address] [place], [place] [address]   Prepare RBC (crossmatch)  Result Value Ref Range   Order Confirmation      ORDER PROCESSED BY BLOOD BANK Performed at [facility], [address], [place], [place] [address]   Prepare RBC (crossmatch)  Result Value Ref Range   Order Confirmation      ORDER PROCESSED BY BLOOD BANK Performed at [facility], [address] [place], [place] [address]   Prepare RBC (crossmatch)  Result Value Ref Range   Order Confirmation      ORDER PROCESSED BY BLOOD BANK Performed at [facility], [address] [place], [place] [address]   BPAM [facility]  Result Value Ref Range   ISSUE DATE / TIME 060156153794    Blood Product Unit Number [id number]    PRODUCT CODE [id number]    Unit Type and Rh 0600    Blood Product Expiration Date 964383818403    ISSUE DATE / TIME 754360677034    Blood Product Unit Number [id number]    PRODUCT CODE [id number]    Unit Type and Rh 0600    Blood Product Expiration Date 695072257505    ISSUE DATE / TIME 183358251898    Blood  Product Unit Number [id number]    PRODUCT CODE [id number]    Unit Type and Rh 0600    Blood Product Expiration Date 159470761518    ISSUE DATE / TIME 343735789784    Blood Product Unit Number [id number]    PRODUCT CODE [id number]    Unit Type and Rh 0600    Blood Product Expiration Date 185501586825    ISSUE DATE / TIME 749355217471    Blood Product Unit Number [id number]    PRODUCT CODE [id number]    Unit Type and Rh 0600    Blood Product Expiration Date 837793968864       Assessment & Plan:   Problem List Items Addressed This Visit      Cardiovascular and Mediastinum   Aortic atherosclerosis (HCC)    Noted on past lung screening.  Educated patient on this.  Recommend complete cessation of smoking + continue daily statin & ASA for prevention.        Respiratory   Paraseptal emphysema (HCC)    Chronic, no current inhalers or symptoms.  Long time smoker.  Have recommended complete cessation.  Will plan on spirometry at upcoming visits.  Continue yearly lung CT CA screening.  Initiate inhaler regimen as needed.  CBC today.        Other   Acute blood loss anemia - Primary    Acute and improving symptoms at this time.  Denies any further blood noted -- no emesis or blood in  stool.  Has mild fatigue lingering.  Recheck CBC and BMP today + iron/ferritin levels.  Recommend eating lots of deep green, leafy vegetables at home and avoid medications with Ibuprofen and alcohol use.  To continue PPI for the next 8 to 12 weeks.  Follow-up with GI and review their note, will work with CCM team on transportation to this appointment.  Return in 3 months, sooner if any worsening or ongoing symptoms.      Relevant Orders   Basic metabolic panel   CBC with Differential/Platelet   Iron, TIBC and Ferritin Panel      Time: 25 minutes, >50% spent counseling/or care coordination   Follow up plan: Return in about 3 months (around 12/13/2020) for HTN/HLD, ANEMIA, COPD.

## 2020-09-15 NOTE — Assessment & Plan Note (Signed)
Acute and improving symptoms at this time.  Denies any further blood noted -- no emesis or blood in stool.  Has mild fatigue lingering.  Recheck CBC and BMP today + iron/ferritin levels.  Recommend eating lots of deep green, leafy vegetables at home and avoid medications with Ibuprofen and alcohol use.  To continue PPI for the next 8 to 12 weeks.  Follow-up with GI and review their note, will work with CCM team on transportation to this appointment.  Return in 3 months, sooner if any worsening or ongoing symptoms.

## 2020-09-15 NOTE — Assessment & Plan Note (Signed)
Noted on past lung screening.  Educated patient on this.  Recommend complete cessation of smoking + continue daily statin & ASA for prevention.

## 2020-09-16 ENCOUNTER — Telehealth: Payer: Self-pay

## 2020-09-16 LAB — CBC WITH DIFFERENTIAL/PLATELET
Basophils Absolute: 0.1 10*3/uL (ref 0.0–0.2)
Basos: 1 %
EOS (ABSOLUTE): 0.2 10*3/uL (ref 0.0–0.4)
Eos: 3 %
Hematocrit: 34.9 % — ABNORMAL LOW (ref 37.5–51.0)
Hemoglobin: 11.1 g/dL — ABNORMAL LOW (ref 13.0–17.7)
Immature Grans (Abs): 0 10*3/uL (ref 0.0–0.1)
Immature Granulocytes: 0 %
Lymphocytes Absolute: 2.4 10*3/uL (ref 0.7–3.1)
Lymphs: 30 %
MCH: 29.8 pg (ref 26.6–33.0)
MCHC: 31.8 g/dL (ref 31.5–35.7)
MCV: 94 fL (ref 79–97)
Monocytes Absolute: 0.7 10*3/uL (ref 0.1–0.9)
Monocytes: 9 %
Neutrophils Absolute: 4.6 10*3/uL (ref 1.4–7.0)
Neutrophils: 57 %
Platelets: 401 10*3/uL (ref 150–450)
RBC: 3.72 x10E6/uL — ABNORMAL LOW (ref 4.14–5.80)
RDW: 13.6 % (ref 11.6–15.4)
WBC: 8 10*3/uL (ref 3.4–10.8)

## 2020-09-16 LAB — BASIC METABOLIC PANEL
BUN/Creatinine Ratio: 9 — ABNORMAL LOW (ref 10–24)
BUN: 10 mg/dL (ref 8–27)
CO2: 21 mmol/L (ref 20–29)
Calcium: 9 mg/dL (ref 8.6–10.2)
Chloride: 106 mmol/L (ref 96–106)
Creatinine, Ser: 1.12 mg/dL (ref 0.76–1.27)
GFR calc Af Amer: 82 mL/min/{1.73_m2} (ref >59)
GFR calc non Af Amer: 71 mL/min/{1.73_m2} (ref >59)
Glucose: 114 mg/dL — ABNORMAL HIGH (ref 65–99)
Potassium: 4.8 mmol/L (ref 3.5–5.2)
Sodium: 142 mmol/L (ref 134–144)

## 2020-09-16 LAB — IRON,TIBC AND FERRITIN PANEL
Ferritin: 60 ng/mL (ref 30–400)
Iron Saturation: 14 % — ABNORMAL LOW (ref 15–55)
Iron: 42 ug/dL (ref 38–169)
Total Iron Binding Capacity: 300 ug/dL (ref 250–450)
UIBC: 258 ug/dL (ref 111–343)

## 2020-09-16 NOTE — Progress Notes (Signed)
Good morning please let Mark [name] know his labs have returned.  Kidney function remains stable.  CBC shows his anemia is slowly improving with improved hemoglobin and hematocrit, although some still mildly low levels.  Iron is on low side of normal.  I would recommend adding on a daily iron supplement. I recommend Slow Fe as this is less likely to cause constipation.  I will send this in for short term use, goal is to help improve levels.  Ensure to keep follow-up with GI, we are working on transportation for you.  Any questions? Keep being awesome!!  Thank you for allowing me to participate in your care. Kindest regards, Dianelys Scinto

## 2020-09-16 NOTE — Telephone Encounter (Signed)
   Telephone encounter was:  Successful.  09/16/2020 Name: Mark [name] MRN: [id number] DOB: 29-Oct-1960  Mark [name] is a 60 y.o. year old male who is a primary care patient of Cannady, Dorie Rank, NP . The community resource team was consulted for assistance with Transportation Needs   Care guide performed the following interventions: Patient provided with information about care guide support team and interviewed to confirm resource needs Obtained verbal consent to place patient referral to [employer]/product [employer] Placed referral to [employer] via phone call spoke with Carney Bern.  Follow Up Plan:  No further follow up planned at this time. The patient has been provided with needed resources. and Patient was told he would need to be ready for pick up at 12pm, wear mask, call [employer] after appointment for pick up. No charge for rides through March 2022, Call a week to 2 weeks in advance.  Tewana Bohlen, AAS Paralegal, [facility] Care Guide . Embedded Care Coordination [facility]  Care Management  [address] [place], [place] [address] ??[email or web address]  ?? [phone]   [email or web address]

## 2020-09-16 NOTE — Progress Notes (Signed)
Good morning please let Mark [name] know his labs have returned. Kidney function remains stable. CBC shows his anemia is slowly improving with improved hemoglobin and hematocrit, although some still mildly low levels. Iron is on low side of normal. I recommend he continue iron supplement sent by hospital twice a day.  Ensure to keep follow-up with GI, we are working on transportation for you. Any questions? Keep being awesome!! Thank you for allowing me to participate in your care. Kindest regards, Travares Nelles

## 2020-09-18 ENCOUNTER — Other Ambulatory Visit: Payer: Self-pay | Admitting: Nurse Practitioner

## 2020-09-18 NOTE — Telephone Encounter (Signed)
Requested medications are on the active medication list yes  Last visit 2/2  Future visit scheduled 2/11  Notes to clinic Pt is coming in next week, it is too early to fill rx, however, sig. States 4 times a day, Freq and OV note states 3x day. Please assess.

## 2020-09-20 NOTE — Telephone Encounter (Signed)
Routing to provider  

## 2020-09-24 ENCOUNTER — Telehealth: Payer: Self-pay

## 2020-09-24 NOTE — Telephone Encounter (Signed)
This nurse called patient in order to perform telephonic AWV. Just once we got started he asked how long was it going to be. Once answered he decided that he did not have time to do it. Said he'd reschedule for another time at a later date.

## 2020-09-24 NOTE — Progress Notes (Signed)
This encounter was created in error - please disregard.

## 2020-10-11 ENCOUNTER — Other Ambulatory Visit: Payer: Self-pay | Admitting: Nurse Practitioner

## 2020-10-11 DIAGNOSIS — I10 Essential (primary) hypertension: Secondary | ICD-10-CM

## 2020-10-14 ENCOUNTER — Ambulatory Visit: Payer: Medicare HMO | Admitting: Gastroenterology

## 2020-10-14 ENCOUNTER — Encounter: Payer: Self-pay | Admitting: Gastroenterology

## 2020-10-14 ENCOUNTER — Other Ambulatory Visit: Payer: Self-pay | Admitting: Nurse Practitioner

## 2020-10-14 ENCOUNTER — Other Ambulatory Visit: Payer: Self-pay

## 2020-10-14 ENCOUNTER — Telehealth: Payer: Self-pay

## 2020-10-14 VITALS — BP 143/93 | HR 85 | Temp 98.3°F | Ht 66.0 in | Wt 153.4 lb

## 2020-10-14 DIAGNOSIS — K3182 Dieulafoy lesion (hemorrhagic) of stomach and duodenum: Secondary | ICD-10-CM

## 2020-10-14 DIAGNOSIS — D649 Anemia, unspecified: Secondary | ICD-10-CM | POA: Diagnosis not present

## 2020-10-14 DIAGNOSIS — Z86010 Personal history of colonic polyps: Secondary | ICD-10-CM

## 2020-10-14 MED ORDER — NA SULFATE-K SULFATE-MG SULF 17.5-3.13-1.6 GM/177ML PO SOLN: ORAL | 0 refills | Status: DC

## 2020-10-14 NOTE — Telephone Encounter (Signed)
   Telephone encounter was:  Successful.  10/14/2020 Name: Mark [name] MRN: [id number] DOB: [birth year]  Mark [name] is a 60 y.o. year old male who is a primary care patient of Cannady, Dorie Rank, NP . The community resource team was consulted for assistance with Transportation Needs   Care guide performed the following interventions: Called to remind [name] that [employer] would pick him between 12-12:30pm for his 1:15pm aptt at [facility]. Gave number to call [phone] after appt. for ride home..  Follow Up Plan:  No further follow up planned at this time. The patient has been provided with needed resources.  Alayasia Breeding, AAS Paralegal, [facility] Care Guide . Embedded Care Coordination [facility] Health  Care Management  [address] [place], [place] [address] ??[email or web address]  ?? [phone]   [email or web address]

## 2020-10-14 NOTE — Progress Notes (Signed)
Melodie Bouillon, MD [address]  [address]  [place], [place] [address]  Main: [phone]  Fax: [phone]   Primary Care Physician: Marjie Skiff, NP   Chief Complaint  Patient presents with  . Anemia    HPI: Mark [name] is a 60 y.o. male with recent hospitalization for anemia And hematochezia here for follow-up. Patient denies any evidence of bleeding since hospital discharge.  Denies any nausea or vomiting, abdominal pain, weight loss.  During his colonoscopy colon polyp was noted but not removed due to active bleeding during the procedure.  His hemoglobin has improved since hospital discharge.  Current Outpatient Medications  Medication Sig Dispense Refill  . amLODipine (NORVASC) 10 MG tablet TAKE 1 TABLET BY MOUTH EVERY DAY 90 tablet 0  . aspirin EC 81 MG tablet Take 1 tablet (81 mg total) by mouth daily. Swallow whole. 150 tablet 2  . citalopram (CELEXA) 20 MG tablet TAKE 1 TABLET BY MOUTH EVERY DAY 90 tablet 0  . cyclobenzaprine (FLEXERIL) 10 MG tablet TAKE 1 TABLET BY MOUTH THREE TIMES A DAY AS NEEDED FOR MUSCLE SPASMS 45 tablet 1  . ferrous sulfate 325 (65 FE) MG EC tablet Take 1 tablet (325 mg total) by mouth 2 (two) times daily. 60 tablet 3  . metoprolol tartrate (LOPRESSOR) 25 MG tablet TAKE 1 TABLET BY MOUTH TWICE A DAY 180 tablet 0  . Na Sulfate-K Sulfate-Mg Sulf 17.5-3.13-1.6 GM/177ML SOLN At 5 PM the day before procedure take 1 bottle and 5 hours before procedure take 1 bottle. 354 mL 0  . omeprazole (PRILOSEC) 20 MG capsule Take 1 capsule (20 mg total) by mouth 2 (two) times daily before a meal. 60 capsule 3  . ondansetron (ZOFRAN) 4 MG tablet Take 1 tablet (4 mg total) by mouth every 8 (eight) hours as needed for nausea or vomiting. 8 tablet 0  . pravastatin (PRAVACHOL) 20 MG tablet TAKE 1 TABLET BY MOUTH EVERY DAY 90 tablet 2  . sucralfate (CARAFATE) 1 g tablet TAKE 1 TABLET (1 G TOTAL) BY MOUTH 4 (FOUR) TIMES DAILY - WITH MEALS AND AT  BEDTIME. 120 tablet 1  . tamsulosin (FLOMAX) 0.4 MG CAPS capsule TAKE 1 CAPSULE BY MOUTH EVERY DAY (Patient taking differently: Take 0.4 mg by mouth daily.) 90 capsule 1  . vitamin B-12 1000 MCG tablet Take 1 tablet (1,000 mcg total) by mouth daily. 90 tablet 0   No current facility-administered medications for this visit.    Allergies as of 10/14/2020 - Review Complete 10/14/2020  Allergen Reaction Noted  . Codeine Hives 05/03/2015    ROS:  General: Negative for anorexia, weight loss, fever, chills, fatigue, weakness. ENT: Negative for hoarseness, difficulty swallowing , nasal congestion. CV: Negative for chest pain, angina, palpitations, dyspnea on exertion, peripheral edema.  Respiratory: Negative for dyspnea at rest, dyspnea on exertion, cough, sputum, wheezing.  GI: See history of present illness. GU:  Negative for dysuria, hematuria, urinary incontinence, urinary frequency, nocturnal urination.  Endo: Negative for unusual weight change.    Physical Examination:   BP (!) 143/93   Pulse 85   Temp 98.3 F (36.8 C) (Oral)   Ht 5\' 6"  (1.676 m)   Wt 153 lb 6.4 oz (69.6 kg)   BMI 24.76 kg/m   General: Well-nourished, well-developed in no acute distress.  Eyes: No icterus. Conjunctivae pink. Mouth: Oropharyngeal mucosa moist and pink , no lesions erythema or exudate. Neck: Supple, Trachea midline Abdomen: Bowel sounds are normal, nontender,  nondistended, no hepatosplenomegaly or masses, no abdominal bruits or hernia , no rebound or guarding.   Extremities: No lower extremity edema. No clubbing or deformities. Neuro: Alert and oriented x 3.  Grossly intact. Skin: Warm and dry, no jaundice.   Psych: Alert and cooperative, normal mood and affect.   Labs: CMP     Component Value Date/Time   NA 142 09/15/2020 1418   NA 140 09/21/2011 2027   K 4.8 09/15/2020 1418   K 4.3 09/21/2011 2027   CL 106 09/15/2020 1418   CL 106 09/21/2011 2027   CO2 21 09/15/2020 1418   CO2 24  09/21/2011 2027   GLUCOSE 114 (H) 09/15/2020 1418   GLUCOSE 74 09/07/2020 0441   GLUCOSE 95 09/21/2011 2027   BUN 10 09/15/2020 1418   BUN 13 09/21/2011 2027   CREATININE 1.12 09/15/2020 1418   CREATININE 0.94 09/21/2011 2027   CALCIUM 9.0 09/15/2020 1418   CALCIUM 9.1 09/21/2011 2027   PROT 6.3 (L) 09/02/2020 0758   PROT 6.7 09/01/2020 1607   PROT 7.2 09/21/2011 2027   ALBUMIN 3.5 09/02/2020 0758   ALBUMIN 4.3 09/01/2020 1607   ALBUMIN 3.9 09/21/2011 2027   AST 15 09/02/2020 0758   AST 20 09/21/2011 2027   ALT 15 09/02/2020 0758   ALT 24 09/21/2011 2027   ALKPHOS 47 09/02/2020 0758   ALKPHOS 87 09/21/2011 2027   BILITOT 0.7 09/02/2020 0758   BILITOT 0.2 09/01/2020 1607   BILITOT 0.3 09/21/2011 2027   GFRNONAA 71 09/15/2020 1418   GFRNONAA >60 09/07/2020 0441   GFRNONAA >60 09/21/2011 2027   GFRAA 82 09/15/2020 1418   GFRAA >60 09/21/2011 2027   Lab Results  Component Value Date   WBC 8.0 09/15/2020   HGB 11.1 (L) 09/15/2020   HCT 34.9 (L) 09/15/2020   MCV 94 09/15/2020   PLT 401 09/15/2020    Imaging Studies: No results found.  Assessment and Plan:   Mark [name] is a 60 y.o. y/o male here for posthospitalization follow-up for anemia  Patient is in need of a colonoscopy to for removal of previously seen colon polyp  I have discussed alternative options, risks & benefits,  which include, but are not limited to, bleeding, infection, perforation,respiratory complication & drug reaction.  The patient agrees with this plan & written consent will be obtained.    Anemia improving as expected  No evidence of active bleeding    Dr Melodie Bouillon

## 2020-10-19 ENCOUNTER — Telehealth: Payer: Self-pay | Admitting: *Deleted

## 2020-10-19 DIAGNOSIS — Z8673 Personal history of transient ischemic attack (TIA), and cerebral infarction without residual deficits: Secondary | ICD-10-CM

## 2020-10-19 NOTE — Telephone Encounter (Signed)
New referral has been placed.  °

## 2020-10-19 NOTE — Addendum Note (Signed)
Addended by: Aura Dials T on: 10/19/2020 04:09 PM   Modules accepted: Orders

## 2020-10-19 NOTE — Telephone Encounter (Addendum)
Pt verbalized understanding that referral was placed.  Pt came in stating he need a referral for [facility] to get his license back. He had one in for last year but it needs to be resent please

## 2020-10-25 ENCOUNTER — Other Ambulatory Visit: Admission: RE | Admit: 2020-10-25 | Payer: Medicare HMO | Source: Ambulatory Visit

## 2020-10-27 ENCOUNTER — Encounter: Admission: RE | Payer: Self-pay | Source: Home / Self Care

## 2020-10-27 ENCOUNTER — Ambulatory Visit: Admission: RE | Admit: 2020-10-27 | Payer: Medicare HMO | Source: Home / Self Care | Admitting: Gastroenterology

## 2020-10-27 SURGERY — COLONOSCOPY WITH PROPOFOL
Anesthesia: General

## 2020-11-15 ENCOUNTER — Ambulatory Visit (INDEPENDENT_AMBULATORY_CARE_PROVIDER_SITE_OTHER): Payer: Medicare HMO | Admitting: Nurse Practitioner

## 2020-11-15 ENCOUNTER — Encounter: Payer: Self-pay | Admitting: Nurse Practitioner

## 2020-11-15 ENCOUNTER — Other Ambulatory Visit: Payer: Self-pay

## 2020-11-15 DIAGNOSIS — F4321 Adjustment disorder with depressed mood: Secondary | ICD-10-CM | POA: Insufficient documentation

## 2020-11-15 MED ORDER — ALPRAZOLAM 0.25 MG PO TABS: 0.2500 mg | ORAL_TABLET | Freq: Every evening | ORAL | 0 refills | Status: DC | PRN

## 2020-11-15 NOTE — Assessment & Plan Note (Addendum)
Loss of his mother, who was main support person, 3 weeks ago.  At this time offered referral to therapy which he declines.  Denies SI/HI.  Has support in son and brother locally.  Will supply short burst Xanax 0.25 MG to take once daily only if needed to help with anxiety and rest, discussed at length with him this is a short script only and not for long term use.  PDMP reviewed and no recent controlled substances.  Due to his history will NOT use long term.   If long term medication needed for anxiety/depression will initiate SSRI or SNRI, he agrees with this plan and reports understanding.  Discussed reaching out to hospice grief support group, which he is not interested in.  Offered supportive listening.  New emergent CCM referral to SW and nurse manager to reach out to patient.  Return in 6 weeks, sooner if worsening mood.

## 2020-11-15 NOTE — Progress Notes (Signed)
BP 127/85   Pulse 90   Temp 98.9 F (37.2 C) (Oral)   Wt 157 lb 6.4 oz (71.4 kg)   SpO2 97%   BMI 25.41 kg/m    Subjective:    Patient ID: Mark [name], male    DOB: 12/02/1960, 60 y.o.   MRN: [id number]  HPI: Mark [name] is a 60 y.o. male  Chief Complaint  Patient presents with  . Follow-up    Patient states he is here to talk to the doctor as his mother passed away three weeks ago.    DEPRESSION He presents today for grieving, as his mother passed 3 weeks ago unexpectedly after acute event.  She was main support, his best friend -- they lived in same household and she prepped his medications and assisted him. He is struggling with her loss and would like something short term to help with anxiety.  His son and son's fiance are supportive, son's fiance is a Engineer, civil (consulting) and is prepping his meds for week.  His brother lives across town, is supportive.  Requests something to help him relax. Mood status: uncontrolled Satisfied with current treatment?: yes Symptom severity: moderate  Duration of current treatment : chronic Psychotherapy/counseling: none Depressed mood: yes Anxious mood: yes Anhedonia: no Significant weight loss or gain: no Insomnia: no hard to fall asleep Fatigue: no Feelings of worthlessness or guilt: no Impaired concentration/indecisiveness: no Suicidal ideations: no Hopelessness: no Crying spells: yes Depression screen [facility] 2/9 11/15/2020 09/15/2020 02/09/2020 05/09/2019 06/04/2018  Decreased Interest 0 0 0 1 0  Down, Depressed, Hopeless 3 0 0 1 0  PHQ - 2 Score 3 0 0 2 0  Altered sleeping 3 - - 0 0  Tired, decreased energy 0 - - 3 2  Change in appetite 0 - - 1 0  Feeling bad or failure about yourself  0 - - 0 0  Trouble concentrating 0 - - 0 0  Moving slowly or fidgety/restless 0 - - 0 0  Suicidal thoughts 0 - - 0 0  PHQ-9 Score 6 - - 6 2  Difficult doing work/chores - - - Not difficult at all Not difficult at all   GAD 7 : Generalized Anxiety  Score 11/15/2020  Nervous, Anxious, on Edge 0  Control/stop worrying 0  Worry too much - different things 0  Trouble relaxing 0  Restless 0  Easily annoyed or irritable 0  Afraid - awful might happen 0  Total GAD 7 Score 0   Relevant past medical, surgical, family and social history reviewed and updated as indicated. Interim medical history since our last visit reviewed. Allergies and medications reviewed and updated.  Review of Systems  Constitutional: Negative for activity change, diaphoresis, fatigue and fever.  Respiratory: Negative for cough, chest tightness, shortness of breath and wheezing.   Cardiovascular: Negative for chest pain, palpitations and leg swelling.  Neurological: Negative.   Psychiatric/Behavioral: Negative.     Per HPI unless specifically indicated above     Objective:    BP 127/85   Pulse 90   Temp 98.9 F (37.2 C) (Oral)   Wt 157 lb 6.4 oz (71.4 kg)   SpO2 97%   BMI 25.41 kg/m   Wt Readings from Last 3 Encounters:  11/15/20 157 lb 6.4 oz (71.4 kg)  10/14/20 153 lb 6.4 oz (69.6 kg)  09/15/20 154 lb 3.2 oz (69.9 kg)    Physical Exam Vitals and nursing note reviewed.  Constitutional:  General: He is awake. He is not in acute distress.    Appearance: He is well-developed and well-groomed. He is not ill-appearing.  HENT:     Head: Normocephalic and atraumatic.     Right Ear: Hearing normal. No drainage.     Left Ear: Hearing normal. No drainage.  Eyes:     General: Lids are normal.        Right eye: No discharge.        Left eye: No discharge.     Conjunctiva/sclera: Conjunctivae normal.     Pupils: Pupils are equal, round, and reactive to light.  Neck:     Vascular: No carotid bruit.     Trachea: Trachea normal.  Cardiovascular:     Rate and Rhythm: Normal rate and regular rhythm.     Heart sounds: Normal heart sounds, S1 normal and S2 normal. No murmur heard. No gallop.   Pulmonary:     Effort: Pulmonary effort is normal. No  accessory muscle usage or respiratory distress.     Breath sounds: Normal breath sounds.  Abdominal:     General: Bowel sounds are normal.     Palpations: Abdomen is soft. There is no hepatomegaly or splenomegaly.  Musculoskeletal:        General: Normal range of motion.     Cervical back: Normal range of motion and neck supple.     Right lower leg: No edema.     Left lower leg: No edema.  Skin:    General: Skin is warm and dry.     Capillary Refill: Capillary refill takes less than 2 seconds.     Findings: No rash.  Neurological:     Mental Status: He is alert and oriented to person, place, and time.     Deep Tendon Reflexes: Reflexes are normal and symmetric.  Psychiatric:        Attention and Perception: Attention normal.        Mood and Affect: Affect is tearful.        Speech: Speech normal.        Behavior: Behavior normal. Behavior is cooperative.        Thought Content: Thought content normal.     Comments: Very tearful.    Results for orders placed or performed in visit on 09/15/20  Basic metabolic panel  Result Value Ref Range   Glucose 114 (H) 65 - 99 mg/dL   BUN 10 8 - 27 mg/dL   Creatinine, Ser 4.09 0.76 - 1.27 mg/dL   GFR calc non Af Amer 71 >59 mL/min/1.73   GFR calc Af Amer 82 >59 mL/min/1.73   BUN/Creatinine Ratio 9 (L) 10 - 24   Sodium 142 134 - 144 mmol/L   Potassium 4.8 3.5 - 5.2 mmol/L   Chloride 106 96 - 106 mmol/L   CO2 21 20 - 29 mmol/L   Calcium 9.0 8.6 - 10.2 mg/dL  CBC with Differential/Platelet  Result Value Ref Range   WBC 8.0 3.4 - 10.8 x10E3/uL   RBC 3.72 (L) 4.14 - 5.80 x10E6/uL   Hemoglobin 11.1 (L) 13.0 - 17.7 g/dL   Hematocrit 81.1 (L) 91.4 - 51.0 %   MCV 94 79 - 97 fL   MCH 29.8 26.6 - 33.0 pg   MCHC 31.8 31.5 - 35.7 g/dL   RDW 78.2 95.6 - 21.3 %   Platelets 401 150 - 450 x10E3/uL   Neutrophils 57 Not Estab. %   Lymphs 30 Not Estab. %  Monocytes 9 Not Estab. %   Eos 3 Not Estab. %   Basos 1 Not Estab. %   Neutrophils  Absolute 4.6 1.4 - 7.0 x10E3/uL   Lymphocytes Absolute 2.4 0.7 - 3.1 x10E3/uL   Monocytes Absolute 0.7 0.1 - 0.9 x10E3/uL   EOS (ABSOLUTE) 0.2 0.0 - 0.4 x10E3/uL   Basophils Absolute 0.1 0.0 - 0.2 x10E3/uL   Immature Granulocytes 0 Not Estab. %   Immature Grans (Abs) 0.0 0.0 - 0.1 x10E3/uL  Iron, TIBC and Ferritin Panel  Result Value Ref Range   Total Iron Binding Capacity 300 250 - 450 ug/dL   UIBC 542 706 - 237 ug/dL   Iron 42 38 - 628 ug/dL   Iron Saturation 14 (L) 15 - 55 %   Ferritin 60 30 - 400 ng/mL      Assessment & Plan:   Problem List Items Addressed This Visit      Other   Grief reaction    Loss of his mother, who was main support person, 3 weeks ago.  At this time offered referral to therapy which he declines.  Denies SI/HI.  Has support in son and brother locally.  Will supply short burst Xanax 0.25 MG to take once daily only if needed to help with anxiety and rest, discussed at length with him this is a short script only and not for long term use.  PDMP reviewed and no recent controlled substances.  Due to his history will NOT use long term.   If long term medication needed for anxiety/depression will initiate SSRI or SNRI, he agrees with this plan and reports understanding.  Discussed reaching out to hospice grief support group, which he is not interested in.  Offered supportive listening.  New emergent CCM referral to SW and nurse manager to reach out to patient.  Return in 6 weeks, sooner if worsening mood.      Relevant Orders   AMB Referral to [facility]       Follow up plan: Return in about 6 weeks (around 12/27/2020) for Grief.

## 2020-11-15 NOTE — Patient Instructions (Signed)
Managing Loss, Adult People experience loss in many different ways throughout their lives. Events such as moving, changing jobs, and losing friends can create a sense of loss. The loss may be as serious as a major health change, divorce, death of a pet, or death of a loved one. All of these types of loss are likely to create a physical and emotional reaction known as grief. Grief is the result of a major change or an absence of something or someone that you count on. Grief is a normal reaction to loss. A variety of factors can affect your grieving experience, including:  The nature of your loss.  Your relationship to what or whom you lost.  Your understanding of grief and how to manage it.  Your support system. How to manage lifestyle changes Keep to your normal routine as much as possible.  If you have trouble focusing or doing normal activities, it is acceptable to take some time away from your normal routine.  Spend time with friends and loved ones.  Eat a healthy diet, get plenty of sleep, and rest when you feel tired.   How to recognize changes  The way that you deal with your grief will affect your ability to function as you normally do. When grieving, you may experience these changes:  Numbness, shock, sadness, anxiety, anger, denial, and guilt.  Thoughts about death.  Unexpected crying.  A physical sensation of emptiness in your stomach.  Problems sleeping and eating.  Tiredness (fatigue).  Loss of interest in normal activities.  Dreaming about or imagining seeing the person who died.  A need to remember what or whom you lost.  Difficulty thinking about anything other than your loss for a period of time.  Relief. If you have been expecting the loss for a while, you may feel a sense of relief when it happens. Follow these instructions at home: Activity Express your feelings in healthy ways, such as:  Talking with others about your loss. It may be helpful to find  others who have had a similar loss, such as a support group.  Writing down your feelings in a journal.  Doing physical activities to release stress and emotional energy.  Doing creative activities like painting, sculpting, or playing or listening to music.  Practicing resilience. This is the ability to recover and adjust after facing challenges. Reading some resources that encourage resilience may help you to learn ways to practice those behaviors.   General instructions  Be patient with yourself and others. Allow the grieving process to happen, and remember that grieving takes time. ? It is likely that you may never feel completely done with some grief. You may find a way to move on while still cherishing memories and feelings about your loss. ? Accepting your loss is a process. It can take months or longer to adjust.  Keep all follow-up visits as told by your health care provider. This is important. Where to find support To get support for managing loss:  Ask your health care provider for help and recommendations, such as grief counseling or therapy.  Think about joining a support group for people who are managing a loss. Where to find more information You can find more information about managing loss from:  American Society of Clinical Oncology: www.cancer.net  American Psychological Association: www.apa.org Contact a health care provider if:  Your grief is extreme and keeps getting worse.  You have ongoing grief that does not improve.  Your body shows symptoms   of grief, such as illness.  You feel depressed, anxious, or lonely. Get help right away if:  You have thoughts about hurting yourself or others. If you ever feel like you may hurt yourself or others, or have thoughts about taking your own life, get help right away. You can go to your nearest emergency department or call:  Your local emergency services (911 in the [place]  A suicide crisis helpline, such as the  National Suicide Prevention Lifeline at 1-800-273-8255. This is open 24 hours a day. Summary  Grief is the result of a major change or an absence of someone or something that you count on. Grief is a normal reaction to loss.  The depth of grief and the period of recovery depend on the type of loss and your ability to adjust to the change and process your feelings.  Processing grief requires patience and a willingness to accept your feelings and talk about your loss with people who are supportive.  It is important to find resources that work for you and to realize that people experience grief differently. There is not one grieving process that works for everyone in the same way.  Be aware that when grief becomes extreme, it can lead to more severe issues like isolation, depression, anxiety, or suicidal thoughts. Talk with your health care provider if you have any of these issues. This information is not intended to replace advice given to you by your health care provider. Make sure you discuss any questions you have with your health care provider. Document Revised: 01/22/2020 Document Reviewed: 01/22/2020 Elsevier Patient Education  2021 Elsevier Inc.  

## 2020-11-16 ENCOUNTER — Telehealth: Payer: Self-pay

## 2020-11-16 NOTE — Chronic Care Management (AMB) (Signed)
  Chronic Care Management   Note  11/16/2020 Name: Mark [name] MRN: [id number] DOB: [birth year]  Mark [name] is a 60 y.o. year old male who is a primary care patient of Cannady, Dorie Rank, NP. Mark [name] is currently enrolled in care management services. An additional referral for LCSW was placed.   Follow up plan: Telephone appointment with care management team member scheduled for:11/19/2020  Mark [name], RMA Care Guide, Embedded Care Coordination [facility]  [place], [place] [address] Direct Dial: [phone] Mark [name].Mark [name] Website: [email or web address]

## 2020-11-19 ENCOUNTER — Ambulatory Visit (INDEPENDENT_AMBULATORY_CARE_PROVIDER_SITE_OTHER): Payer: Medicare HMO | Admitting: Licensed Clinical Social Worker

## 2020-11-19 DIAGNOSIS — F32A Depression, unspecified: Secondary | ICD-10-CM | POA: Diagnosis not present

## 2020-11-19 DIAGNOSIS — E785 Hyperlipidemia, unspecified: Secondary | ICD-10-CM

## 2020-11-19 DIAGNOSIS — F4321 Adjustment disorder with depressed mood: Secondary | ICD-10-CM

## 2020-11-19 DIAGNOSIS — F419 Anxiety disorder, unspecified: Secondary | ICD-10-CM

## 2020-11-19 DIAGNOSIS — Z8673 Personal history of transient ischemic attack (TIA), and cerebral infarction without residual deficits: Secondary | ICD-10-CM

## 2020-11-19 DIAGNOSIS — I10 Essential (primary) hypertension: Secondary | ICD-10-CM

## 2020-11-19 NOTE — Patient Instructions (Addendum)
Licensed Clinical Social Worker Visit Information  Goals we discussed today:  Goals Addressed            This Visit's Progress   . Track and Manage My Symptoms-Depression       Timeframe:  Long-Range Goal Priority:  Medium Start Date:     11/19/20                        Expected End Date:    02/18/21                   Follow Up Date -01/03/21   - avoid negative self-talk - develop a personal safety plan - develop a plan to deal with triggers like holidays, anniversaries - exercise at least 2 to 3 times per week - have a plan for how to handle bad days - journal feelings and what helps to feel better or worse - spend time or talk with others at least 2 to 3 times per week - spend time or talk with others every day - watch for early signs of feeling worse - write in journal every day    Why is this important?    Keeping track of your progress will help your treatment team find the right mix of medicine and therapy for you.   Write in your journal every day.   Day-to-day changes in depression symptoms are normal. It may be more helpful to check your progress at the end of each week instead of every day.    Current barriers:   . Chronic Mental Health needs related to depression, grief and anxiety  Needs Support, Education, and Care Coordination in order to meet unmet mental health needs. . Lacks knowledge of where and how to connect for grief support resources  Clinical Goal(s): Over the next 120 days, patient will work with SW to reduce or manage symptoms of depression and grief  and increase knowledge and/or ability of: coping skills, healthy habits, self-management skills, and stress reduction.until connected for ongoing counseling.  Clinical Interventions:  . Assessed patient's previous treatment, needs, coping skills, current treatment, support system and barriers to care . Patient lost his mother who was his best friend last month. He reports that he is having difficulty  processing this loss. Patient is experiencing grief and loneliness as his mother lived with him and provided him with needed conversations and socialization. Patient reports that he has family members that come and check on him regularly.  . Patient interviewed and appropriate assessments performed or reviewed: brief mental health assessment;Suicidal Ideation/Homicidal Ideation: No . Provided basic mental health support, education and intervention . Collaborated with [facility] regarding referral for grief counseling.  . Discussed several options for long term counseling based on need and insurance. Patient is interested in grief counseling and CCM LCSW successfully completed referral to program on 11/19/20. . Reviewed mental health medications with patient prescribed by PCP and discussed compliance:  . Other interventions include: Motivational Interviewing . Grief Counseling . Emotional/Supportive Counseling  . Collaboration with PCP regarding development and update of comprehensive plan of care as evidenced by provider attestation and co-signature . Inter-disciplinary care team collaboration (see longitudinal plan of care) . Patient was informed that current CCM LCSW will be leaving position next month and his next CCM Social Work follow up visit will be with another LCSW. Patient was appreciative of support provided and receptive to news             Mark [name] was given information about Chronic Care Management services today including:  1. CCM service includes personalized support from designated clinical staff supervised by his physician, including individualized plan of care and coordination with other care providers 2. 24/7 contact phone numbers for assistance for urgent and routine care needs. 3. Service will only be billed when office clinical staff spend 20 minutes or more in a month to coordinate care. 4. Only one practitioner may furnish and bill the service in a calendar  month. 5. The patient may stop CCM services at any time (effective at the end of the month) by phone call to the office staff. 6. The patient will be responsible for cost sharing (co-pay) of up to 20% of the service fee (after annual deductible is met).  Patient agreed to services and verbal consent obtained.    , BSW, MSW, LCSW [facility]   [email or web address] Phone: [phone]    

## 2020-11-19 NOTE — Chronic Care Management (AMB) (Signed)
Chronic Care Management    Clinical Social Work Note  11/19/2020 Name: Mark [name] MRN: [id number] DOB: 11-28-1960  Mark [name] is a 60 y.o. year old male who is a primary care patient of Cannady, Barbaraann Faster, NP. The CCM team was consulted to assist the patient with chronic disease management and/or care coordination needs related to: Mental Health Counseling and Resources and Grief Counseling.   Engaged with patient by telephone for initial visit in response to provider referral for social work chronic care management and care coordination services.   Consent to Services:  The patient was given the following information about Chronic Care Management services today, agreed to services, and gave verbal consent: 1. CCM service includes personalized support from designated clinical staff supervised by the primary care provider, including individualized plan of care and coordination with other care providers 2. 24/7 contact phone numbers for assistance for urgent and routine care needs. 3. Service will only be billed when office clinical staff spend 20 minutes or more in a month to coordinate care. 4. Only one practitioner may furnish and bill the service in a calendar month. 5.The patient may stop CCM services at any time (effective at the end of the month) by phone call to the office staff. 6. The patient will be responsible for cost sharing (co-pay) of up to 20% of the service fee (after annual deductible is met). Patient agreed to services and consent obtained.  Patient agreed to services and consent obtained.   Assessment: Review of patient past medical history, allergies, medications, and health status, including review of relevant consultants reports was performed today as part of a comprehensive evaluation and provision of chronic care management and care coordination services.     SDOH (Social Determinants of Health) assessments and interventions performed:    Advanced Directives  Status: Not addressed in this encounter.  CCM Care Plan  Allergies  Allergen Reactions  . Codeine Hives    Outpatient Encounter Medications as of 11/19/2020  Medication Sig  . ALPRAZolam (XANAX) 0.25 MG tablet Take 1 tablet (0.25 mg total) by mouth at bedtime as needed for anxiety.  Marland Kitchen amLODipine (NORVASC) 10 MG tablet TAKE 1 TABLET BY MOUTH EVERY DAY  . aspirin EC 81 MG tablet Take 1 tablet (81 mg total) by mouth daily. Swallow whole.  . citalopram (CELEXA) 20 MG tablet TAKE 1 TABLET BY MOUTH EVERY DAY  . cyclobenzaprine (FLEXERIL) 10 MG tablet TAKE 1 TABLET BY MOUTH THREE TIMES A DAY AS NEEDED FOR MUSCLE SPASMS  . ferrous sulfate 325 (65 FE) MG EC tablet Take 1 tablet (325 mg total) by mouth 2 (two) times daily.  . metoprolol tartrate (LOPRESSOR) 25 MG tablet TAKE 1 TABLET BY MOUTH TWICE A DAY  . Na Sulfate-K Sulfate-Mg Sulf 17.5-3.13-1.6 GM/177ML SOLN At 5 PM the day before procedure take 1 bottle and 5 hours before procedure take 1 bottle.  Marland Kitchen omeprazole (PRILOSEC) 20 MG capsule Take 1 capsule (20 mg total) by mouth 2 (two) times daily before a meal.  . ondansetron (ZOFRAN) 4 MG tablet Take 1 tablet (4 mg total) by mouth every 8 (eight) hours as needed for nausea or vomiting.  . pravastatin (PRAVACHOL) 20 MG tablet TAKE 1 TABLET BY MOUTH EVERY DAY  . sucralfate (CARAFATE) 1 g tablet TAKE 1 TABLET (1 G TOTAL) BY MOUTH 4 (FOUR) TIMES DAILY - WITH MEALS AND AT BEDTIME.  . tamsulosin (FLOMAX) 0.4 MG CAPS capsule TAKE 1 CAPSULE BY MOUTH EVERY DAY (Patient  taking differently: Take 0.4 mg by mouth daily.)  . vitamin B-12 1000 MCG tablet Take 1 tablet (1,000 mcg total) by mouth daily.   No facility-administered encounter medications on file as of 11/19/2020.    Patient Active Problem List   Diagnosis Date Noted  . Grief reaction 11/15/2020  . Dieulafoy lesion of stomach   . Aortic atherosclerosis ([place]) 05/13/2020  . Back pain 01/02/2020  . Degenerative joint disease (DJD) of lumbar spine  12/26/2019  . History of kidney stones 05/09/2019  . Pulmonary nodule 05/09/2019  . Paraseptal emphysema ([place]) 05/09/2019  . Nicotine dependence, cigarettes, w unsp disorders 04/30/2019  . Hyperlipidemia LDL goal <70 09/05/2018  . Elevated hemoglobin A1c 09/03/2018  . Cognitive impairment 12/15/2016  . History of opioid abuse ([place]) 12/30/2015  . History of intestinal obstruction 05/03/2015  . 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 Anxiety and depression 05/03/2015  . Hypertension 05/03/2015  . 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 History of CVA (cerebrovascular accident) 05/03/2015    Conditions to be addressed/monitored: Anxiety and Depression; Mental Health Concerns  and Social Isolation  Care Plan : General Social Work (Adult)  Updates made by Greg Cutter, LCSW since 11/19/2020 12:00 AM    Problem: Coping Skills (General Plan of Care)     Long-Range Goal: Coping Skills Enhanced   Start Date: 11/19/2020  Priority: Medium  Note:   Timeframe:  Long-Range Goal Priority:  Medium Start Date:     11/19/20                        Expected End Date:    02/18/21                   Follow Up Date -01/03/21   - avoid negative self-talk - develop a personal safety plan - develop a plan to deal with triggers like holidays, anniversaries - exercise at least 2 to 3 times per week - have a plan for how to handle bad days - journal feelings and what helps to feel better or worse - spend time or talk with others at least 2 to 3 times per week - spend time or talk with others every day - watch for early signs of feeling worse - write in journal every day    Why is this important?    Keeping track of your progress will help your treatment team find the right mix of medicine and therapy for you.   Write in your journal every day.   Day-to-day changes in depression symptoms are normal. It may be more helpful to check your progress at the end of each week instead of every  day.    Current barriers:   . Chronic Mental Health needs related to depression, grief and anxiety  Needs Support, Education, and Care Coordination in order to meet unmet mental health needs. Leodis [place] knowledge of where and how to connect for grief support resources  Clinical Goal(s): Over the next 120 days, patient will work with SW to reduce or manage symptoms of depression and grief and increase knowledge and/or ability of: coping skills, healthy habits, self-management skills, and stress reduction.until connected for ongoing counseling.  Clinical Interventions:  . Assessed patient's previous treatment, needs, coping skills, current treatment, support system and barriers to care . Patient lost his mother who was his best friend last month. He reports that he is having difficulty processing this loss. Patient is experiencing grief and loneliness  as his mother lived with him and provided him with needed conversations and socialization. Patient reports that he has family members that come and check on him regularly.  . Patient interviewed and appropriate assessments performed or reviewed: brief mental health assessment;Suicidal Ideation/Homicidal Ideation: No . Provided basic mental health support, education and intervention . Collaborated with [facility] regarding referral for grief counseling.  . Discussed several options for long term counseling based on need and insurance. Patient is interested in grief counseling and CCM LCSW successfully completed referral to program on 11/19/20. . Reviewed mental health medications with patient prescribed by PCP and discussed compliance:  . Other interventions include: Motivational Interviewing . Grief Counseling . Emotional/Supportive Counseling  . Collaboration with PCP regarding development and update of comprehensive plan of care as evidenced by provider attestation and co-signature . Inter-disciplinary care team collaboration (see longitudinal plan of  care) . Patient was informed that current CCM LCSW will be leaving position next month and his next CCM Social Work follow up visit will be with another LCSW. Patient was appreciative of support provided and receptive to news   Task: Support Psychosocial Response to Risk or Actual Health Condition   Note:   Care Management Activities:    - active listening utilized - counseling provided - current coping strategies identified - decision-making supported - healthy lifestyle promoted - journaling promoted - meditative movement therapy encouraged - mindfulness encouraged - participation in counseling encouraged - problem-solving facilitated - relaxation techniques promoted - self-reflection promoted - spiritual activities promoted - verbalization of feelings encouraged    Notes:       Follow Up Plan: SW will follow up with patient by phone over the next 45 days      Eula Fried, [place], MSW, [email or web address] Phone: [phone]

## 2020-11-29 ENCOUNTER — Other Ambulatory Visit: Payer: Self-pay | Admitting: Nurse Practitioner

## 2020-11-29 DIAGNOSIS — I10 Essential (primary) hypertension: Secondary | ICD-10-CM

## 2020-11-29 NOTE — Telephone Encounter (Signed)
Requested medication (s) are due for refill today: yes  Requested medication (s) are on the active medication list: yes  Last refill:  09/01/20  Future visit scheduled: no  Notes to clinic:  historical provider    Requested Prescriptions  Pending Prescriptions Disp Refills   omeprazole (PRILOSEC) 20 MG capsule [Pharmacy Med Name: OMEPRAZOLE DR 20 MG CAPSULE] 90 capsule 1    Sig: TAKE 1 CAPSULE BY MOUTH EVERY DAY      Gastroenterology: Proton Pump Inhibitors Passed - 11/29/2020  1:21 AM      Passed - Valid encounter within last 12 months    Recent Outpatient Visits           2 weeks ago Grief reaction   [facility] [place], [place] T, NP   2 months ago Acute blood loss anemia   [facility] [place], [place] T, NP   2 months ago Fatigue, unspecified type   [facility], [place] T, NP   5 months ago Paraseptal emphysema (HCC)   [facility] [place], Jolene T, NP   11 months ago Acute left-sided low back pain without sciatica   [facility] [place], [place] T, NP                 Signed Prescriptions Disp Refills   amLODipine (NORVASC) 10 MG tablet 90 tablet 0    Sig: TAKE 1 TABLET BY MOUTH EVERY DAY      Cardiovascular:  Calcium Channel Blockers Passed - 11/29/2020  1:21 AM      Passed - Last BP in normal range    BP Readings from Last 1 Encounters:  11/15/20 127/85          Passed - Valid encounter within last 6 months    Recent Outpatient Visits           2 weeks ago Grief reaction   [facility] [place], [place] T, NP   2 months ago Acute blood loss anemia   [facility] [place], [place] T, NP   2 months ago Fatigue, unspecified type   [facility], [place] T, NP   5 months ago Paraseptal emphysema (HCC)   [facility] [place], Jolene T, NP   11 months ago Acute left-sided low back pain without sciatica   [facility] [place],  Dorie Rank, NP

## 2020-11-29 NOTE — Telephone Encounter (Signed)
F/u scheduled 5/18

## 2020-11-29 NOTE — Telephone Encounter (Signed)
Requested Prescriptions  Pending Prescriptions Disp Refills  . amLODipine (NORVASC) 10 MG tablet [Pharmacy Med Name: AMLODIPINE BESYLATE 10 MG TAB] 90 tablet 0    Sig: TAKE 1 TABLET BY MOUTH EVERY DAY     Cardiovascular:  Calcium Channel Blockers Passed - 11/29/2020  1:21 AM      Passed - Last BP in normal range    BP Readings from Last 1 Encounters:  11/15/20 127/85         Passed - Valid encounter within last 6 months    Recent Outpatient Visits          2 weeks ago Grief reaction   [facility] [place], [place] T, NP   2 months ago Acute blood loss anemia   [facility] [place], [place] T, NP   2 months ago Fatigue, unspecified type   [facility], [place] T, NP   5 months ago Paraseptal emphysema (HCC)   [facility] [place], Jolene T, NP   11 months ago Acute left-sided low back pain without sciatica   [facility] [place], Jolene T, NP             . omeprazole (PRILOSEC) 20 MG capsule [Pharmacy Med Name: OMEPRAZOLE DR 20 MG CAPSULE] 90 capsule 1    Sig: TAKE 1 CAPSULE BY MOUTH EVERY DAY     Gastroenterology: Proton Pump Inhibitors Passed - 11/29/2020  1:21 AM      Passed - Valid encounter within last 12 months    Recent Outpatient Visits          2 weeks ago Grief reaction   [facility] [place], [place] T, NP   2 months ago Acute blood loss anemia   [facility], [place] T, NP   2 months ago Fatigue, unspecified type   [facility], [place] T, NP   5 months ago Paraseptal emphysema (HCC)   [facility] [place], Jolene T, NP   11 months ago Acute left-sided low back pain without sciatica   [facility] [place], Dorie Rank, NP

## 2020-12-07 ENCOUNTER — Encounter: Payer: Self-pay | Admitting: Nurse Practitioner

## 2020-12-07 ENCOUNTER — Ambulatory Visit (INDEPENDENT_AMBULATORY_CARE_PROVIDER_SITE_OTHER): Payer: Medicare HMO | Admitting: Nurse Practitioner

## 2020-12-07 ENCOUNTER — Telehealth: Payer: Self-pay

## 2020-12-07 ENCOUNTER — Other Ambulatory Visit: Payer: Self-pay

## 2020-12-07 VITALS — BP 112/78 | HR 89 | Temp 98.7°F | Wt 153.0 lb

## 2020-12-07 DIAGNOSIS — F4321 Adjustment disorder with depressed mood: Secondary | ICD-10-CM | POA: Diagnosis not present

## 2020-12-07 DIAGNOSIS — F32A Depression, unspecified: Secondary | ICD-10-CM | POA: Diagnosis not present

## 2020-12-07 DIAGNOSIS — I10 Essential (primary) hypertension: Secondary | ICD-10-CM

## 2020-12-07 DIAGNOSIS — F419 Anxiety disorder, unspecified: Secondary | ICD-10-CM

## 2020-12-07 MED ORDER — TAMSULOSIN HCL 0.4 MG PO CAPS: 0.4000 mg | ORAL_CAPSULE | Freq: Every day | ORAL | 4 refills | Status: DC

## 2020-12-07 MED ORDER — AMLODIPINE BESYLATE 10 MG PO TABS: 1.0000 | ORAL_TABLET | Freq: Every day | ORAL | 4 refills | Status: DC

## 2020-12-07 MED ORDER — METOPROLOL TARTRATE 25 MG PO TABS
25.0000 mg | ORAL_TABLET | Freq: Two times a day (BID) | ORAL | 4 refills | Status: DC
Start: 2020-12-07 — End: 2021-11-14

## 2020-12-07 MED ORDER — PRAVASTATIN SODIUM 20 MG PO TABS: 20.0000 mg | ORAL_TABLET | Freq: Every day | ORAL | 4 refills | Status: DC

## 2020-12-07 MED ORDER — CITALOPRAM HYDROBROMIDE 20 MG PO TABS: 20.0000 mg | ORAL_TABLET | Freq: Every day | ORAL | 4 refills | Status: DC

## 2020-12-07 MED ORDER — ALPRAZOLAM 0.25 MG PO TABS: 0.2500 mg | ORAL_TABLET | Freq: Every evening | ORAL | 0 refills | Status: DC | PRN

## 2020-12-07 MED ORDER — OMEPRAZOLE 20 MG PO CPDR: DELAYED_RELEASE_CAPSULE | ORAL | 4 refills | Status: DC

## 2020-12-07 NOTE — Progress Notes (Signed)
BP 112/78   Pulse 89   Temp 98.7 F (37.1 C) (Oral)   Wt 153 lb (69.4 kg)   SpO2 96%   BMI 24.69 kg/m    Subjective:    Patient ID: Mark [name], male    DOB: 03-09-1961, 60 y.o.   MRN: [id number]  HPI: Mark [name] is a 60 y.o. male  Chief Complaint  Patient presents with  . Depression  . Medication Discussion    Patient states "the medication has helped him a whole lot." Patient states he think he may need the medication twice a day and not just at bedtime.    DEPRESSION Follow-up today for grieving.  He presents today for grieving, as his mother passed 7 weeks ago unexpectedly after acute event.  She was main support, his best friend -- they lived in same household and she prepped his medications and assisted him. He is struggling with her loss still as they lived together for years.  His son and son's fiance are supportive, son's fiance is a Engineer, civil (consulting) and is prepping his meds for week + visit regularly.  His brother lives across town, is supportive, but only comes once a week to house.  Was given a brief round of Xanax 0.25 MG to take at night, which he reports benefit from.  Is aware this is short term use only and to continue Celexa.   Mood status: stable Satisfied with current treatment?: yes Symptom severity: moderate  Duration of current treatment : chronic Psychotherapy/counseling: none Depressed mood: yes Anxious mood: yes Anhedonia: no Significant weight loss or gain: no Insomnia: no hard to fall asleep -- Xanax helps some at night Fatigue: no Feelings of worthlessness or guilt: no Impaired concentration/indecisiveness: no Suicidal ideations: no Hopelessness: no Crying spells: yes Depression screen [place] 2/9 12/07/2020 11/15/2020 09/15/2020 02/09/2020 05/09/2019  Decreased Interest 3 0 0 0 1  Down, Depressed, Hopeless 3 3 0 0 1  PHQ - 2 Score 6 3 0 0 2  Altered sleeping 0 3 - - 0  Tired, decreased energy 0 0 - - 3  Change in appetite 0 0 - - 1  Feeling bad or  failure about yourself  0 0 - - 0  Trouble concentrating 1 0 - - 0  Moving slowly or fidgety/restless 0 0 - - 0  Suicidal thoughts 0 0 - - 0  PHQ-9 Score 7 6 - - 6  Difficult doing work/chores - - - - Not difficult at all   GAD 7 : Generalized Anxiety Score 11/15/2020  Nervous, Anxious, on Edge 0  Control/stop worrying 0  Worry too much - different things 0  Trouble relaxing 0  Restless 0  Easily annoyed or irritable 0  Afraid - awful might happen 0  Total GAD 7 Score 0   Relevant past medical, surgical, family and social history reviewed and updated as indicated. Interim medical history since our last visit reviewed. Allergies and medications reviewed and updated.  Review of Systems  Constitutional: Negative for activity change, diaphoresis, fatigue and fever.  Respiratory: Negative for cough, chest tightness, shortness of breath and wheezing.   Cardiovascular: Negative for chest pain, palpitations and leg swelling.  Neurological: Negative.   Psychiatric/Behavioral: Negative.     Per HPI unless specifically indicated above     Objective:    BP 112/78   Pulse 89   Temp 98.7 F (37.1 C) (Oral)   Wt 153 lb (69.4 kg)   SpO2 96%  BMI 24.69 kg/m   Wt Readings from Last 3 Encounters:  12/07/20 153 lb (69.4 kg)  11/15/20 157 lb 6.4 oz (71.4 kg)  10/14/20 153 lb 6.4 oz (69.6 kg)    Physical Exam Vitals and nursing note reviewed.  Constitutional:      General: He is awake. He is not in acute distress.    Appearance: He is well-developed and well-groomed. He is not ill-appearing.  HENT:     Head: Normocephalic and atraumatic.     Right Ear: Hearing normal. No drainage.     Left Ear: Hearing normal. No drainage.  Eyes:     General: Lids are normal.        Right eye: No discharge.        Left eye: No discharge.     Conjunctiva/sclera: Conjunctivae normal.     Pupils: Pupils are equal, round, and reactive to light.  Neck:     Vascular: No carotid bruit.     Trachea:  Trachea normal.  Cardiovascular:     Rate and Rhythm: Normal rate and regular rhythm.     Heart sounds: Normal heart sounds, S1 normal and S2 normal. No murmur heard. No gallop.   Pulmonary:     Effort: Pulmonary effort is normal. No accessory muscle usage or respiratory distress.     Breath sounds: Normal breath sounds.  Abdominal:     General: Bowel sounds are normal.     Palpations: Abdomen is soft. There is no hepatomegaly or splenomegaly.  Musculoskeletal:        General: Normal range of motion.     Cervical back: Normal range of motion and neck supple.     Right lower leg: No edema.     Left lower leg: No edema.  Skin:    General: Skin is warm and dry.     Capillary Refill: Capillary refill takes less than 2 seconds.     Findings: No rash.  Neurological:     Mental Status: He is alert and oriented to person, place, and time.     Deep Tendon Reflexes: Reflexes are normal and symmetric.  Psychiatric:        Attention and Perception: Attention normal.        Mood and Affect: Affect is tearful.        Speech: Speech normal.        Behavior: Behavior normal. Behavior is cooperative.        Thought Content: Thought content normal.     Comments: Very tearful.    Results for orders placed or performed in visit on 09/15/20  Basic metabolic panel  Result Value Ref Range   Glucose 114 (H) 65 - 99 mg/dL   BUN 10 8 - 27 mg/dL   Creatinine, Ser 2.75 0.76 - 1.27 mg/dL   GFR calc non Af Amer 71 >59 mL/min/1.73   GFR calc Af Amer 82 >59 mL/min/1.73   BUN/Creatinine Ratio 9 (L) 10 - 24   Sodium 142 134 - 144 mmol/L   Potassium 4.8 3.5 - 5.2 mmol/L   Chloride 106 96 - 106 mmol/L   CO2 21 20 - 29 mmol/L   Calcium 9.0 8.6 - 10.2 mg/dL  CBC with Differential/Platelet  Result Value Ref Range   WBC 8.0 3.4 - 10.8 x10E3/uL   RBC 3.72 (L) 4.14 - 5.80 x10E6/uL   Hemoglobin 11.1 (L) 13.0 - 17.7 g/dL   Hematocrit 17.0 (L) 01.7 - 51.0 %   MCV 94 79 -  97 fL   MCH 29.8 26.6 - 33.0 pg    MCHC 31.8 31.5 - 35.7 g/dL   RDW 40.913.6 81.111.6 - 91.415.4 %   Platelets 401 150 - 450 x10E3/uL   Neutrophils 57 Not Estab. %   Lymphs 30 Not Estab. %   Monocytes 9 Not Estab. %   Eos 3 Not Estab. %   Basos 1 Not Estab. %   Neutrophils Absolute 4.6 1.4 - 7.0 x10E3/uL   Lymphocytes Absolute 2.4 0.7 - 3.1 x10E3/uL   Monocytes Absolute 0.7 0.1 - 0.9 x10E3/uL   EOS (ABSOLUTE) 0.2 0.0 - 0.4 x10E3/uL   Basophils Absolute 0.1 0.0 - 0.2 x10E3/uL   Immature Granulocytes 0 Not Estab. %   Immature Grans (Abs) 0.0 0.0 - 0.1 x10E3/uL  Iron, TIBC and Ferritin Panel  Result Value Ref Range   Total Iron Binding Capacity 300 250 - 450 ug/dL   UIBC 782258 956111 - 213343 ug/dL   Iron 42 38 - 086169 ug/dL   Iron Saturation 14 (L) 15 - 55 %   Ferritin 60 30 - 400 ng/mL      Assessment & Plan:   Problem List Items Addressed This Visit      Cardiovascular and Mediastinum   Hypertension    Chronic, ongoing.  At goal in office.  Continue current medication regimen and adjust as needed.  Recommend he monitor BP a few days a week at home and document for provider + focus on DASH diet.  BMP and TSH next visit.  Return to office in 4 weeks.      Relevant Medications   pravastatin (PRAVACHOL) 20 MG tablet   metoprolol tartrate (LOPRESSOR) 25 MG tablet   amLODipine (NORVASC) 10 MG tablet     Other   Anxiety and depression - Primary    Chronic, ongoing, exacerbated by loss of mother recently.  Denies SI/HI.  Has support in son and brother locally.  Will refill short burst Xanax 0.25 MG to take once daily only if needed to help with anxiety and rest, discussed at length with him this is a short script only and not for long term use.  PDMP reviewed and no other recent controlled substances.  Due to his history of substance abuse will NOT use long term. He is aware of this and aware to continue Celexa daily 20 MG, may change to Zoloft in future if needed for further up titration.  Discussed reaching out to hospice grief support  group, which he is not interested in.  Offered supportive listening.  Return in 4 weeks, sooner if worsening mood.      Relevant Medications   citalopram (CELEXA) 20 MG tablet   ALPRAZolam (XANAX) 0.25 MG tablet   Grief reaction    Loss of his mother, who was main support person, 7 weeks ago.  At this time offered referral to therapy which he declines.  Denies SI/HI.  Has support in son and brother locally.  Will refill short burst Xanax 0.25 MG to take once daily only if needed to help with anxiety and rest, discussed at length with him this is a short script only and not for long term use.  PDMP reviewed and no other recent controlled substances.  Due to his history of substance abuse will NOT use long term. He is aware of this and aware to continue Celexa daily 20 MG, may change to Zoloft in future if needed for further up titration.  Discussed reaching out to hospice grief  support group, which he is not interested in.  Offered supportive listening.  Return in 4 weeks, sooner if worsening mood.       Other Visit Diagnoses    Essential hypertension       Relevant Medications   pravastatin (PRAVACHOL) 20 MG tablet   metoprolol tartrate (LOPRESSOR) 25 MG tablet   amLODipine (NORVASC) 10 MG tablet       Follow up plan: Return in about 4 weeks (around 01/04/2021) for MOOD, HTN/HLD, GERD.

## 2020-12-07 NOTE — Patient Instructions (Signed)
Complicated Grief Grief is a normal response to the death of someone close to you. Feelings of fear, anger, and guilt can affect almost everyone who loses a loved one. It is also common to have symptoms of depression while you are grieving. These include problems with sleep, loss of appetite, and lack of energy. They may last for weeks or months after a loss. Complicated grief is different from normal grief or depression. Normal grieving involves sadness and feelings of loss, but those feelings get better and heal over time. Complicated grief is a severe type of grief that lasts for a long time, usually for several months to a year or longer. It interferes with your ability to function normally. Complicated grief may require treatment from a mental health care provider. What are the causes? The cause of this condition is not known. It is not clear why some people continue to struggle with grief and others do not. What increases the risk? You are more likely to develop this condition if:  The death of your loved one was sudden or unexpected.  The death of your loved one was due to a violent event.  Your loved one died from suicide.  Your loved one was a child or a young person.  You were very close to your loved one, or you were dependent on him or her.  You have a history of depression or anxiety. What are the signs or symptoms? Symptoms of this condition include:  Feeling disbelief or having a lack of emotion (numbness).  Being unable to enjoy good memories of your loved one.  Needing to avoid anything or anyone that reminds you of your loved one.  Being unable to stop thinking about the death.  Feeling intense anger or guilt.  Feeling alone and hopeless.  Feeling that your life is meaningless and empty.  Losing the desire to move on with your life. How is this diagnosed? This condition may be diagnosed based on:  Your symptoms. Complicated grief will be diagnosed if you have  ongoing symptoms of grief for 6-12 months or longer.  The effect of symptoms on your life. You may be diagnosed with this condition if your symptoms are interfering with your ability to live your life. Your health care provider may recommend that you see a mental health care provider. Many symptoms of depression are similar to the symptoms of complicated grief. It is important to be evaluated for complicated grief along with other mental health conditions. How is this treated? This condition is most commonly treated with talk therapy. This therapy is offered by a mental health specialist (psychiatrist). During therapy:  You will learn healthy ways to cope with the loss of your loved one.  Your mental health care provider may recommend antidepressant medicines.   Follow these instructions at home: Lifestyle  Take care of yourself. ? Eat on a regular basis, and maintain a healthy diet. Eat plenty of fruits, vegetables, lean protein, and whole grains. ? Try to get some exercise each day. Aim for 30 minutes of exercise on most days of the week. ? Keep a consistent sleep schedule. Try to get 8 or more hours of sleep each night. ? Start doing the things that you used to enjoy.  Do not use drugs or alcohol to ease your symptoms.  Spend time with friends and loved ones.   General instructions  Take over-the-counter and prescription medicines only as told by your health care provider.  Consider joining a grief (  bereavement) support group to help you deal with your loss.  Keep all follow-up visits as told by your health care provider. This is important. Contact a health care provider if:  Your symptoms prevent you from functioning normally.  Your symptoms do not get better with treatment. Get help right away if:  You have serious thoughts about hurting yourself or someone else.  You have suicidal feelings. If you ever feel like you may hurt yourself or others, or have thoughts about  taking your own life, get help right away. You can go to your nearest emergency department or call:  Your local emergency services (911 in the [place]  A suicide crisis helpline, such as the National Suicide Prevention Lifeline at 1-800-273-8255. This is open 24 hours a day. Summary  Complicated grief is a severe type of grief that lasts for a long time. This grief is not likely to go away on its own. Get the help you need.  Some griefs are more difficult than others and can cause this condition. You may need a certain type of treatment to help you recover if the loss of your loved one was sudden, violent, or due to suicide.  You may feel guilty about moving on with your life. Getting help does not mean that you are forgetting your loved one. It means that you are taking care of yourself.  Complicated grief is best treated with talk therapy. Medicines may also be prescribed.  Seek the help you need, and find support that will help you recover. This information is not intended to replace advice given to you by your health care provider. Make sure you discuss any questions you have with your health care provider. Document Revised: 01/22/2020 Document Reviewed: 01/22/2020 Elsevier Patient Education  2021 [employer].  

## 2020-12-07 NOTE — Telephone Encounter (Signed)
Returned patient call regarding medications, patient states he went to [facility] and there were no prescriptions there for him. Called [facility] to confirm prescriptions were received, per Toniann Fail they were received but not ready yet. Advised patient prescriptions are not ready yet.

## 2020-12-07 NOTE — Assessment & Plan Note (Signed)
Chronic, ongoing, exacerbated by loss of mother recently.  Denies SI/HI.  Has support in son and brother locally.  Will refill short burst Xanax 0.25 MG to take once daily only if needed to help with anxiety and rest, discussed at length with him this is a short script only and not for long term use.  PDMP reviewed and no other recent controlled substances.  Due to his history of substance abuse will NOT use long term. He is aware of this and aware to continue Celexa daily 20 MG, may change to Zoloft in future if needed for further up titration.  Discussed reaching out to hospice grief support group, which he is not interested in.  Offered supportive listening.  Return in 4 weeks, sooner if worsening mood.

## 2020-12-07 NOTE — Assessment & Plan Note (Signed)
Chronic, ongoing.  At goal in office.  Continue current medication regimen and adjust as needed.  Recommend he monitor BP a few days a week at home and document for provider + focus on DASH diet.  BMP and TSH next visit.  Return to office in 4 weeks.

## 2020-12-07 NOTE — Telephone Encounter (Signed)
Copied from CRM [id number]. Topic: General - Other >> Dec 07, 2020  3:53 PM Tamela Oddi wrote: Reason for CRM: Patient would like the nurse to call him regarding a medication he said the doctor was supposed to send to the pharmacy.  Patient did not know the name of the medication.  Please advise.  CB# [phone]

## 2020-12-07 NOTE — Telephone Encounter (Signed)
Copied from CRM [id number]. Topic: General - Other >> Dec 07, 2020  3:53 PM Tamela Oddi wrote: Reason for CRM: Patient would like the nurse to call him regarding a medication he said the doctor was supposed to send to the pharmacy.  Patient did not know the name of the medication.  Please advise.  CB# [phone] >> Dec 07, 2020  4:10 PM Randol Kern wrote: Made aware of Rx order from today, is still requesting to speak to nurse   Pt would like to speak to clinical staff as he is confused on medications. Had apt on 12/07/2020

## 2020-12-07 NOTE — Assessment & Plan Note (Signed)
Loss of his mother, who was main support person, 7 weeks ago.  At this time offered referral to therapy which he declines.  Denies SI/HI.  Has support in son and brother locally.  Will refill short burst Xanax 0.25 MG to take once daily only if needed to help with anxiety and rest, discussed at length with him this is a short script only and not for long term use.  PDMP reviewed and no other recent controlled substances.  Due to his history of substance abuse will NOT use long term. He is aware of this and aware to continue Celexa daily 20 MG, may change to Zoloft in future if needed for further up titration.  Discussed reaching out to hospice grief support group, which he is not interested in.  Offered supportive listening.  Return in 4 weeks, sooner if worsening mood.

## 2020-12-07 NOTE — Telephone Encounter (Signed)
Copied from CRM [id number]. Topic: General - Other >> Dec 07, 2020  4:10 PM Randol Kern wrote: Made aware of Rx order from today, is still requesting to speak to nurse

## 2020-12-08 NOTE — Telephone Encounter (Signed)
Pt is calling  Checking on the status of [facility]

## 2020-12-08 NOTE — Telephone Encounter (Signed)
Pt called back today requesting to speak with Jolene/ or a nurse, did not disclose any further details, please call back.

## 2020-12-08 NOTE — Telephone Encounter (Signed)
This is double documented as note was also attached to other note

## 2020-12-11 ENCOUNTER — Other Ambulatory Visit: Payer: Self-pay | Admitting: Nurse Practitioner

## 2020-12-13 ENCOUNTER — Ambulatory Visit: Payer: Medicare HMO | Admitting: Nurse Practitioner

## 2020-12-13 ENCOUNTER — Other Ambulatory Visit: Payer: Self-pay | Admitting: Nurse Practitioner

## 2020-12-13 MED ORDER — ALPRAZOLAM 0.25 MG PO TABS: 0.2500 mg | ORAL_TABLET | Freq: Every evening | ORAL | 0 refills | Status: DC | PRN

## 2020-12-27 ENCOUNTER — Ambulatory Visit: Payer: Medicare HMO | Admitting: Nurse Practitioner

## 2020-12-27 ENCOUNTER — Telehealth: Payer: Self-pay | Admitting: Nurse Practitioner

## 2020-12-27 NOTE — Telephone Encounter (Signed)
Placed referral 10/19/20 -- please check on this.  He should have been called to do OT at [facility] where he requested before.

## 2020-12-27 NOTE — Telephone Encounter (Signed)
Patient would like the nurse or doctor to call him regarding some OT which he said the doctor said he would need more OT in order to drive.  Please advise and call patient to discuss at [phone]

## 2020-12-27 NOTE — Telephone Encounter (Addendum)
Pt called to check on the status of OT referal - advised that referral was placed in March to OT at [facility].

## 2020-12-27 NOTE — Telephone Encounter (Signed)
Spoke with patient and he informed me that he recently was evaluate to see if he was able to get his drivers licenses back. Patient states he was told that he would need more "brain therapy" in order for him to get his drivers licenses back. Patient is requesting occupational therapy in order to be able to get his licenses and drive? Please advise?

## 2020-12-28 NOTE — Telephone Encounter (Signed)
Please refer to Mark [name]'s message below -- appears he was seen 12/27/20 with OT and should continue this therapy with [name] with OT.  Does he need something different?

## 2020-12-28 NOTE — Telephone Encounter (Signed)
Attempted to reach out to patient and follow up and was unable due to no voicemail being set up.

## 2020-12-29 ENCOUNTER — Ambulatory Visit: Payer: Medicare HMO | Admitting: Nurse Practitioner

## 2020-12-30 NOTE — Telephone Encounter (Signed)
Spoke with patient and let him know that Jolene recommends patient to continue with OT with [facility]. Patient states he did not pass his last therapy session. Advised patient to keep his scheduled appointment with Jolene on Tuesday 01/04/21. Patient verbalized he will keep appointment to discuss next recommended steps.

## 2020-12-30 NOTE — Telephone Encounter (Signed)
Noted, thank you, unfortunately if did not pass he may need to continue sessions to work on needs.

## 2021-01-03 ENCOUNTER — Telehealth: Payer: Self-pay

## 2021-01-04 ENCOUNTER — Other Ambulatory Visit: Payer: Self-pay

## 2021-01-04 ENCOUNTER — Ambulatory Visit (INDEPENDENT_AMBULATORY_CARE_PROVIDER_SITE_OTHER): Payer: Medicare HMO | Admitting: Nurse Practitioner

## 2021-01-04 ENCOUNTER — Encounter: Payer: Self-pay | Admitting: Nurse Practitioner

## 2021-01-04 VITALS — BP 109/73 | HR 59 | Temp 98.7°F | Wt 154.6 lb

## 2021-01-04 DIAGNOSIS — Z8673 Personal history of transient ischemic attack (TIA), and cerebral infarction without residual deficits: Secondary | ICD-10-CM

## 2021-01-04 DIAGNOSIS — F4321 Adjustment disorder with depressed mood: Secondary | ICD-10-CM | POA: Diagnosis not present

## 2021-01-04 DIAGNOSIS — Z748 Other problems related to care provider dependency: Secondary | ICD-10-CM

## 2021-01-04 MED ORDER — FERROUS SULFATE 325 (65 FE) MG PO TBEC: 325.0000 mg | DELAYED_RELEASE_TABLET | Freq: Every day | ORAL | 4 refills | Status: DC

## 2021-01-04 MED ORDER — MELATONIN 10 MG PO TABS: 10.0000 mg | ORAL_TABLET | Freq: Every day | ORAL | 4 refills | Status: DC

## 2021-01-04 MED ORDER — CYANOCOBALAMIN 1000 MCG PO TABS: 1000.0000 ug | ORAL_TABLET | Freq: Every day | ORAL | 4 refills | Status: DC

## 2021-01-04 NOTE — Assessment & Plan Note (Signed)
Loss of his mother, who was main support person, > 2 months ago.  At this time offered referral to therapy which he declines.  Denies SI/HI.  Has support in son and brother locally.  No further Xanax this was short term only.  Due to his history of substance abuse will NOT use long term. He is aware of this and aware to continue Celexa daily 20 MG, may change to Zoloft in future if needed for further up titration.  Discussed reaching out to hospice grief support group, which he is not interested in.  Offered supportive listening.  Return in August for follow-up, sooner if worsening mood.

## 2021-01-04 NOTE — Progress Notes (Signed)
BP 109/73   Pulse (!) 59   Temp 98.7 F (37.1 C) (Oral)   Wt 154 lb 9.6 oz (70.1 kg)   SpO2 96%   BMI 24.95 kg/m    Subjective:    Patient ID: Mark [name], male    DOB: Jan 17, 1961, 60 y.o.   MRN: [id number]  HPI: Mark [name] is a 60 y.o. male  Chief Complaint  Patient presents with  . Grief    Patient is here to discuss therapy about retrieving his licenses back.Patient states he hasn't driven in the last 20 years.  . Medication Refill    Patient is requesting refill on 2 of his prescriptions. One prescription is for his B12. Patient states he would have to give our office a call back to let us know which other than medication he is out of.   DEPRESSION Follow-up today for grieving.  His mother passed over 2 months ago unexpectedly after acute event.  She was main support, his best friend -- they lived in same household and she prepped his medications and assisted him. He is struggling with her loss still as they lived together for years.  His son and son's fiance are supportive, son's fiance is a Engineer, civil (consulting) and is prepping his meds for week + visit regularly.  His brother lives across town, is supportive, but only comes once a week to house.  Was given a brief round of Xanax 0.25 MG to take at night, which he reports benefit from.  Is aware this is short term use only and to continue Celexa.  Last Xanax fill 12/13/20.  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  Has not driven since this time and would like to attend therapy to work on having license returned.  Has not driven since DWI 20 years ago.  He did attend therapy at [facility] and failed both sessions, would like to attend PT and OT locally to work on this further -- strengthening especially to left side.   Mood status: stable Satisfied with current treatment?: yes Symptom severity: moderate  Duration of current treatment : chronic Psychotherapy/counseling: none Depressed mood: yes Anxious mood:  occasioanl Anhedonia: no Significant weight loss or gain: no Insomnia: no hard to fall asleep -- Xanax helps some at night Fatigue: no Feelings of worthlessness or guilt: no Impaired concentration/indecisiveness: no Suicidal ideations: no Hopelessness: no Crying spells: yes Depression screen [facility] 2/9 12/07/2020 11/15/2020 09/15/2020 02/09/2020 05/09/2019  Decreased Interest 3 0 0 0 1  Down, Depressed, Hopeless 3 3 0 0 1  PHQ - 2 Score 6 3 0 0 2  Altered sleeping 0 3 - - 0  Tired, decreased energy 0 0 - - 3  Change in appetite 0 0 - - 1  Feeling bad or failure about yourself  0 0 - - 0  Trouble concentrating 1 0 - - 0  Moving slowly or fidgety/restless 0 0 - - 0  Suicidal thoughts 0 0 - - 0  PHQ-9 Score 7 6 - - 6  Difficult doing work/chores - - - - Not difficult at all   GAD 7 : Generalized Anxiety Score 01/04/2021 11/15/2020  Nervous, Anxious, on Edge 0 0  Control/stop worrying 0 0  Worry too much - different things 0 0  Trouble relaxing 0 0  Restless 0 0  Easily annoyed or irritable 1 0  Afraid - awful might happen 0 0  Total GAD 7 Score 1 0  Anxiety Difficulty Not difficult at all -   Relevant past medical, surgical, family and social history reviewed and updated as indicated. Interim medical history since our last visit reviewed. Allergies and medications reviewed and updated.  Review of Systems  Constitutional: Negative for activity change, diaphoresis, fatigue and fever.  Respiratory: Negative for cough, chest tightness, shortness of breath and wheezing.   Cardiovascular: Negative for chest pain, palpitations and leg swelling.  Neurological: Negative.   Psychiatric/Behavioral: Negative.     Per HPI unless specifically indicated above     Objective:    BP 109/73   Pulse (!) 59   Temp 98.7 F (37.1 C) (Oral)   Wt 154 lb 9.6 oz (70.1 kg)   SpO2 96%   BMI 24.95 kg/m   Wt Readings from Last 3 Encounters:  01/04/21 154 lb 9.6 oz (70.1 kg)  12/07/20 153 lb (69.4 kg)   11/15/20 157 lb 6.4 oz (71.4 kg)    Physical Exam Vitals and nursing note reviewed.  Constitutional:      General: He is awake. He is not in acute distress.    Appearance: He is well-developed and well-groomed. He is not ill-appearing.  HENT:     Head: Normocephalic and atraumatic.     Right Ear: Hearing normal. No drainage.     Left Ear: Hearing normal. No drainage.  Eyes:     General: Lids are normal.        Right eye: No discharge.        Left eye: No discharge.     Conjunctiva/sclera: Conjunctivae normal.     Pupils: Pupils are equal, round, and reactive to light.  Neck:     Vascular: No carotid bruit.     Trachea: Trachea normal.  Cardiovascular:     Rate and Rhythm: Normal rate and regular rhythm.     Heart sounds: Normal heart sounds, S1 normal and S2 normal. No murmur heard. No gallop.   Pulmonary:     Effort: Pulmonary effort is normal. No accessory muscle usage or respiratory distress.     Breath sounds: Normal breath sounds.  Abdominal:     General: Bowel sounds are normal.     Palpations: Abdomen is soft. There is no hepatomegaly or splenomegaly.  Musculoskeletal:        General: Normal range of motion.     Cervical back: Normal range of motion and neck supple.     Right lower leg: No edema.     Left lower leg: No edema.  Skin:    General: Skin is warm and dry.     Capillary Refill: Capillary refill takes less than 2 seconds.     Findings: No rash.  Neurological:     Mental Status: He is alert and oriented to person, place, and time.     Deep Tendon Reflexes: Reflexes are normal and symmetric.  Psychiatric:        Attention and Perception: Attention normal.        Mood and Affect: Mood normal.        Speech: Speech normal.        Behavior: Behavior normal. Behavior is cooperative.        Thought Content: Thought content normal.     Comments: Good spirits today.    Results for orders placed or performed in visit on 09/15/20  Basic metabolic panel   Result Value Ref Range   Glucose 114 (H) 65 - 99 mg/dL   BUN 10 8 -  27 mg/dL   Creatinine, Ser 6.57 0.76 - 1.27 mg/dL   GFR calc non Af Amer 71 >59 mL/min/1.73   GFR calc Af Amer 82 >59 mL/min/1.73   BUN/Creatinine Ratio 9 (L) 10 - 24   Sodium 142 134 - 144 mmol/L   Potassium 4.8 3.5 - 5.2 mmol/L   Chloride 106 96 - 106 mmol/L   CO2 21 20 - 29 mmol/L   Calcium 9.0 8.6 - 10.2 mg/dL  CBC with Differential/Platelet  Result Value Ref Range   WBC 8.0 3.4 - 10.8 x10E3/uL   RBC 3.72 (L) 4.14 - 5.80 x10E6/uL   Hemoglobin 11.1 (L) 13.0 - 17.7 g/dL   Hematocrit 84.6 (L) 96.2 - 51.0 %   MCV 94 79 - 97 fL   MCH 29.8 26.6 - 33.0 pg   MCHC 31.8 31.5 - 35.7 g/dL   RDW 95.2 84.1 - 32.4 %   Platelets 401 150 - 450 x10E3/uL   Neutrophils 57 Not Estab. %   Lymphs 30 Not Estab. %   Monocytes 9 Not Estab. %   Eos 3 Not Estab. %   Basos 1 Not Estab. %   Neutrophils Absolute 4.6 1.4 - 7.0 x10E3/uL   Lymphocytes Absolute 2.4 0.7 - 3.1 x10E3/uL   Monocytes Absolute 0.7 0.1 - 0.9 x10E3/uL   EOS (ABSOLUTE) 0.2 0.0 - 0.4 x10E3/uL   Basophils Absolute 0.1 0.0 - 0.2 x10E3/uL   Immature Granulocytes 0 Not Estab. %   Immature Grans (Abs) 0.0 0.0 - 0.1 x10E3/uL  Iron, TIBC and Ferritin Panel  Result Value Ref Range   Total Iron Binding Capacity 300 250 - 450 ug/dL   UIBC 401 027 - 253 ug/dL   Iron 42 38 - 664 ug/dL   Iron Saturation 14 (L) 15 - 55 %   Ferritin 60 30 - 400 ng/mL      Assessment & Plan:   Problem List Items Addressed This Visit      Other   History of CVA (cerebrovascular accident)    Referral to PT locally to work on strengthening and balance to assist him in passing to obtain driver's license, due to transportation issues prefers not to return to [facility] and would prefer to do driver's testing locally.      Relevant Orders   Ambulatory referral to Physical Therapy   Grief reaction - Primary    Loss of his mother, who was main support person, > 2 months ago.  At this time  offered referral to therapy which he declines.  Denies SI/HI.  Has support in son and brother locally.  No further Xanax this was short term only.  Due to his history of substance abuse will NOT use long term. He is aware of this and aware to continue Celexa daily 20 MG, may change to Zoloft in future if needed for further up titration.  Discussed reaching out to hospice grief support group, which he is not interested in.  Offered supportive listening.  Return in August for follow-up, sooner if worsening mood.       Other Visit Diagnoses    Assistance with transportation       Community care referral placed, as mother was main transporation and she has now passed.   Relevant Orders   AMB Referral to [facility] Coordinaton       Follow up plan: Return in about 3 months (around 03/30/2021) for HTN/HLD, CVA.

## 2021-01-04 NOTE — Assessment & Plan Note (Signed)
Referral to PT locally to work on strengthening and balance to assist him in passing to obtain driver's license, due to transportation issues prefers not to return to [facility] and would prefer to do driver's testing locally.

## 2021-01-04 NOTE — Patient Instructions (Signed)

## 2021-01-05 ENCOUNTER — Telehealth: Payer: Self-pay | Admitting: *Deleted

## 2021-01-05 ENCOUNTER — Telehealth: Payer: Self-pay

## 2021-01-05 NOTE — Telephone Encounter (Signed)
Copied from CRM [id number]. Topic: Referral - Status >> Jan 05, 2021 10:51 AM [name] wrote: Reason for CRM: Pt called to ask about his referrals for PT and occupational therapy / I advised pt of referral sent for PT but he would like a referral for OT as well to get his license back/ please advise

## 2021-01-05 NOTE — Telephone Encounter (Signed)
   Telephone encounter was:  Successful.  01/05/2021 Name: Mark [name] MRN: [id number] DOB: 07-Apr-1961  Mark [name] is a 60 y.o. year old male who is a primary care patient of Cannady, Dorie Rank, NP . The community resource team was consulted for assistance with Transportation Needs   Care guide performed the following interventions: Patient provided with information about care guide support team and interviewed to confirm resource needs.Patient has an appointment in 3 months I cannot make the appt now with transportation . It can only be dome with in 2 weeks of appt   Follow Up Plan:  No further follow up planned at this time. The patient has been provided with needed resources. Alois Cliche -[facility] Guide , Embedded Care Coordination [facility], Care Management  [phone] [address] [place] , [place] [address] Email : [email or web address]

## 2021-01-05 NOTE — Addendum Note (Signed)
Addended by: Aura Dials T on: 01/05/2021 03:21 PM   Modules accepted: Orders

## 2021-01-05 NOTE — Telephone Encounter (Signed)
Referrals are in place.  They were placed yesterday, but alert him it can take a few days for our referral coordinator to get these scheduled and worked on.  If he has not heard over next week then reach out to [place].

## 2021-01-07 NOTE — Telephone Encounter (Signed)
Pt verbalized understanding.

## 2021-01-28 ENCOUNTER — Ambulatory Visit (INDEPENDENT_AMBULATORY_CARE_PROVIDER_SITE_OTHER): Payer: Medicare HMO | Admitting: Licensed Clinical Social Worker

## 2021-01-28 DIAGNOSIS — F419 Anxiety disorder, unspecified: Secondary | ICD-10-CM | POA: Diagnosis not present

## 2021-01-28 DIAGNOSIS — I10 Essential (primary) hypertension: Secondary | ICD-10-CM

## 2021-01-28 DIAGNOSIS — Z8673 Personal history of transient ischemic attack (TIA), and cerebral infarction without residual deficits: Secondary | ICD-10-CM

## 2021-01-28 DIAGNOSIS — R4189 Other symptoms and signs involving cognitive functions and awareness: Secondary | ICD-10-CM

## 2021-01-28 DIAGNOSIS — F32A Depression, unspecified: Secondary | ICD-10-CM

## 2021-01-28 NOTE — Patient Instructions (Signed)
Visit Information   Goals Addressed             This Visit's Progress    Track and Manage My Symptoms-Depression   On track    Timeframe:  Long-Range Goal Priority:  Medium Start Date:     11/19/20                        Expected End Date:    04/13/21                   Follow Up Date -03/21/21   Patient Goals: Attend all scheduled appointments with Providers Contact office with any questions or concerns Continue compliance with med management Continue utilizing healthy coping skills to assist with management of symptoms         Patient verbalizes understanding of instructions provided today and agrees to view in MyChart.   Telephone follow up appointment with care management team member scheduled for:03/21/21  Jenel Lucks, MSW, LCSW [facility] Care Management [facility]  [facility] [email or web address] Phone [phone] 12:05 PM

## 2021-01-28 NOTE — Chronic Care Management (AMB) (Signed)
Chronic Care Management    Clinical Social Work Note  01/28/2021 Name: Mark [name] MRN: [id number] DOB: 1961/03/16  Mark [name] is a 60 y.o. year old male who is a primary care patient of Cannady, Dorie Rank, NP. The CCM team was consulted to assist the patient with chronic disease management and/or care coordination needs related to: Mental Health Counseling and Resources and Grief Counseling.   Engaged with patient by telephone for follow up visit in response to provider referral for social work chronic care management and care coordination services.   Consent to Services:  The patient was given information about Chronic Care Management services, agreed to services, and gave verbal consent prior to initiation of services.  Please see initial visit note for detailed documentation.   Patient agreed to services and consent obtained.   Assessment: Patient is engaged in conversation, continues to maintain positive progress with care plan goals. He reports continued difficulty with obtaining adequate sleep. Strategies to enhance sleep hygiene discussed and patient is compliant with medication management to assist with symptoms. He is no longer interested in grief therapy. See Care Plan below for interventions and patient self-care actives. Recent life changes Efrain Sella: Patient is coping with the loss of mother. Reports ongoing difficulty obtaining sleep Recommendation: Patient may benefit from, and is in agreement to work with LCSW to address care coordination needs and will continue to work with the clinical team to address health care and disease management related needs.  Follow up Plan: Patient would like continued follow-up.  CCM LCSW will follow up with patient 03/21/21. Patient will call office if needed prior to next encounter.     SDOH (Social Determinants of Health) assessments and interventions performed:    Advanced Directives Status: Not addressed in this encounter.  CCM  Care Plan  Allergies  Allergen Reactions   Codeine Hives    Outpatient Encounter Medications as of 01/28/2021  Medication Sig   amLODipine (NORVASC) 10 MG tablet Take 1 tablet (10 mg total) by mouth daily.   aspirin EC 81 MG tablet Take 1 tablet (81 mg total) by mouth daily. Swallow whole.   citalopram (CELEXA) 20 MG tablet Take 1 tablet (20 mg total) by mouth daily.   cyanocobalamin 1000 MCG tablet Take 1 tablet (1,000 mcg total) by mouth daily.   ferrous sulfate 325 (65 FE) MG EC tablet Take 1 tablet (325 mg total) by mouth daily with breakfast.   Melatonin 10 MG TABS Take 10 mg by mouth at bedtime.   metoprolol tartrate (LOPRESSOR) 25 MG tablet Take 1 tablet (25 mg total) by mouth 2 (two) times daily.   omeprazole (PRILOSEC) 20 MG capsule TAKE 1 CAPSULE BY MOUTH EVERY DAY   ondansetron (ZOFRAN) 4 MG tablet Take 1 tablet (4 mg total) by mouth every 8 (eight) hours as needed for nausea or vomiting.   pravastatin (PRAVACHOL) 20 MG tablet Take 1 tablet (20 mg total) by mouth daily.   sucralfate (CARAFATE) 1 g tablet TAKE 1 TABLET (1 G TOTAL) BY MOUTH 4 (FOUR) TIMES DAILY - WITH MEALS AND AT BEDTIME.   tamsulosin (FLOMAX) 0.4 MG CAPS capsule Take 1 capsule (0.4 mg total) by mouth daily.   No facility-administered encounter medications on file as of 01/28/2021.    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/26/2019   History of kidney stones 05/09/2019   Pulmonary nodule 05/09/2019   Paraseptal emphysema (HCC) 05/09/2019   Nicotine dependence, cigarettes, w unsp disorders 04/30/2019   Hyperlipidemia LDL goal <70 09/05/2018   Elevated hemoglobin A1c 09/03/2018   Cognitive impairment 12/15/2016   History of opioid abuse (HCC) 12/30/2015   History of intestinal obstruction 05/03/2015   Dysphagia  following cerebrovascular accident 05/03/2015   Anxiety and depression 05/03/2015   Hypertension 05/03/2015   Aphasia with stroke 05/03/2015   History of CVA (cerebrovascular accident) 05/03/2015    Conditions to be addressed/monitored: Anxiety and Depression; Mental Health Concerns  and Grief  Care Plan : General Social Work (Adult)  Updates made by Bridgett Larsson, LCSW since 01/28/2021 12:00 AM     Problem: Coping Skills (General Plan of Care)      Long-Range Goal: Coping Skills Enhanced   Start Date: 11/19/2020  This Visit's Progress: On track  Priority: Medium  Note:   Timeframe:  Long-Range Goal Priority:  Medium Start Date:     11/19/20                        Expected End Date:    04/13/21                   Current barriers:   Chronic Mental Health needs related to depression, grief and anxiety Needs Support, Education, and Care Coordination in order to meet unmet mental health needs. Lacks knowledge of where and how to connect for grief support resources  Clinical Goal(s): Over the next 120 days, patient will work with SW to reduce or manage symptoms of depression and grief and increase knowledge and/or ability of: coping skills, healthy habits, self-management skills, and stress reduction.until connected for ongoing counseling.  Clinical Interventions:  Assessed patient's previous treatment, needs, coping skills, current treatment, support system and barriers to care Patient interviewed and appropriate assessments performed or reviewed: brief mental health assessment;Suicidal Ideation/Homicidal Ideation: No Provided basic mental health support, education and intervention Patient reports "I'm okay" when CCM LCSW inquired about anxiety and/or depression symptoms triggered by grief. Patient shared that he receives strong support from family Patient described sleep as "so-so", due to waking up throughout the night periodically. He is compliant with medication management to assist  with symptoms. Denies any adverse side effects CCM LCSW discussed 1-2 strategies to assist with sleep hygiene Patient reports that he is using healthy coping skills to assist with symptom management Patient reports that he has not heard from PT or OT yet. Understands that this may take a couple of weeks to schedule Collaborated with [facility] on 11/19/20 regarding referral for grief counseling-Patient reports that he has not heard from [facility]. States that he is not interested in grief support services at this moment Other interventions include: Solution Focused Emotional/Supportive Counseling  Collaboration with PCP regarding development and update of comprehensive plan of care as evidenced by provider attestation and co-signature Inter-disciplinary care team collaboration (see longitudinal plan of care) Patient Goals: Attend all scheduled appointments with Providers Contact office with any questions or concerns Continue compliance with med management Continue utilizing healthy coping skills to assist with management of symptoms      Jenel Lucks, MSW, LCSW [facility] Care Management   [facility] [email or web address] Phone [phone] 12:05 PM

## 2021-02-08 ENCOUNTER — Other Ambulatory Visit: Payer: Self-pay | Admitting: Nurse Practitioner

## 2021-02-09 ENCOUNTER — Ambulatory Visit: Payer: Medicare HMO

## 2021-03-01 ENCOUNTER — Telehealth: Payer: Self-pay

## 2021-03-01 DIAGNOSIS — Z8673 Personal history of transient ischemic attack (TIA), and cerebral infarction without residual deficits: Secondary | ICD-10-CM

## 2021-03-01 NOTE — Telephone Encounter (Signed)
Pt stated he did not decline the referrals and was upset it stated he had declined as he states he is still interested in the referrals .

## 2021-03-01 NOTE — Telephone Encounter (Signed)
Copied from CRM [id number]. Topic: General - Other >> Mar 01, 2021 11:24 AM Jaquita Rector A wrote: Reason for CRM: Patient called in to inform Aura Dials that he have not yet gotten a call for the PT or OT that he was referred for. Please advise Ph# [phone]

## 2021-03-16 ENCOUNTER — Telehealth: Payer: Self-pay

## 2021-03-16 NOTE — Telephone Encounter (Signed)
Copied from CRM [id number]. Topic: General - Other >> Mar 16, 2021  2:28 PM Jaquita Rector A wrote: Reason for CRM: Patient called in to inform Aura Dials that he was called in and told he had appointments for physical Therapy and that he does not have a ride. Per patient Jolene always arrange his rides to appointments. Please call  Ph# [phone]   [facility], can you assist with this patient and transportation issues please?

## 2021-03-17 ENCOUNTER — Telehealth: Payer: Self-pay | Admitting: Licensed Clinical Social Worker

## 2021-03-17 NOTE — Telephone Encounter (Signed)
    Clinical Social Work  Care Management   Phone Outreach    03/17/2021 Name: BRISCOE DANIELLO MRN: [id number] DOB: 24-Nov-1960  REINALDO HELT is a 60 y.o. year old male who is a primary care patient of Cannady, Dorie Rank, NP .   F/U phone call today to assess needs, progress and barriers with care plan goals.   Telephone outreach was unsuccessful A HIPPA compliant phone message was left for the patient providing contact information and requesting a return call.   Plan:CCM LCSW will wait for return call. If no return call is received, Will reach out to patient again in the next 7 days .   Review of patient status, including review of consultants reports, relevant laboratory and other test results, and collaboration with appropriate care team members and the patient's provider was performed as part of comprehensive patient evaluation and provision of care management services.    Jenel Lucks, MSW, LCSW [facility] Care Management [place]  [email or web address] Phone [phone] 4:14 PM

## 2021-03-18 ENCOUNTER — Ambulatory Visit (INDEPENDENT_AMBULATORY_CARE_PROVIDER_SITE_OTHER): Payer: Medicare HMO

## 2021-03-18 VITALS — Ht 69.0 in | Wt 155.0 lb

## 2021-03-18 DIAGNOSIS — Z0000 Encounter for general adult medical examination without abnormal findings: Secondary | ICD-10-CM

## 2021-03-18 NOTE — Patient Instructions (Signed)
Mark [name] , Thank you for taking time to come for your Medicare Wellness Visit. I appreciate your ongoing commitment to your health goals. Please review the following plan we discussed and let me know if I can assist you in the future.   Screening recommendations/referrals: Colonoscopy: declines Recommended yearly ophthalmology/optometry visit for glaucoma screening and checkup Recommended yearly dental visit for hygiene and checkup  Vaccinations: Influenza vaccine: decline Pneumococcal vaccine: decline Tdap vaccine: completed 05/17/2015, due 05/16/2025 Shingles vaccine: decline   Covid-19: decline  Advanced directives: Advance directive discussed with you today.   Conditions/risks identified: smoking  Next appointment: Follow up in one year for your annual wellness visit   Preventive Care 40-64 Years, Male Preventive care refers to lifestyle choices and visits with your health care provider that can promote health and wellness. What does preventive care include? A yearly physical exam. This is also called an annual well check. Dental exams once or twice a year. Routine eye exams. Ask your health care provider how often you should have your eyes checked. Personal lifestyle choices, including: Daily care of your teeth and gums. Regular physical activity. Eating a healthy diet. Avoiding tobacco and drug use. Limiting alcohol use. Practicing safe sex. Taking low-dose aspirin every day starting at age 15. What happens during an annual well check? The services and screenings done by your health care provider during your annual well check will depend on your age, overall health, lifestyle risk factors, and family history of disease. Counseling  Your health care provider may ask you questions about your: Alcohol use. Tobacco use. Drug use. Emotional well-being. Home and relationship well-being. Sexual activity. Eating habits. Work and work Astronomer. Screening  You may  have the following tests or measurements: Height, weight, and BMI. Blood pressure. Lipid and cholesterol levels. These may be checked every 5 years, or more frequently if you are over 50 years old. Skin check. Lung cancer screening. You may have this screening every year starting at age 34 if you have a 30-pack-year history of smoking and currently smoke or have quit within the past 15 years. Fecal occult blood test (FOBT) of the stool. You may have this test every year starting at age 40. Flexible sigmoidoscopy or colonoscopy. You may have a sigmoidoscopy every 5 years or a colonoscopy every 10 years starting at age 66. Prostate cancer screening. Recommendations will vary depending on your family history and other risks. Hepatitis C blood test. Hepatitis B blood test. Sexually transmitted disease (STD) testing. Diabetes screening. This is done by checking your blood sugar (glucose) after you have not eaten for a while (fasting). You may have this done every 1-3 years. Discuss your test results, treatment options, and if necessary, the need for more tests with your health care provider. Vaccines  Your health care provider may recommend certain vaccines, such as: Influenza vaccine. This is recommended every year. Tetanus, diphtheria, and acellular pertussis (Tdap, Td) vaccine. You may need a Td booster every 10 years. Zoster vaccine. You may need this after age 65. Pneumococcal 13-valent conjugate (PCV13) vaccine. You may need this if you have certain conditions and have not been vaccinated. Pneumococcal polysaccharide (PPSV23) vaccine. You may need one or two doses if you smoke cigarettes or if you have certain conditions. Talk to your health care provider about which screenings and vaccines you need and how often you need them. This information is not intended to replace advice given to you by your health care provider. Make sure you discuss any  questions you have with your health care  provider. Document Released: 08/27/2015 Document Revised: 04/19/2016 Document Reviewed: 06/01/2015 Elsevier Interactive Patient Education  2017 [place] Prevention in the Home Falls can cause injuries. They can happen to people of all ages. There are many things you can do to make your home safe and to help prevent falls. What can I do on the outside of my home? Regularly fix the edges of walkways and driveways and fix any cracks. Remove anything that might make you trip as you walk through a door, such as a raised step or threshold. Trim any bushes or trees on the path to your home. Use bright outdoor lighting. Clear any walking paths of anything that might make someone trip, such as rocks or tools. Regularly check to see if handrails are loose or broken. Make sure that both sides of any steps have handrails. Any raised decks and porches should have guardrails on the edges. Have any leaves, snow, or ice cleared regularly. Use sand or salt on walking paths during winter. Clean up any spills in your garage right away. This includes oil or grease spills. What can I do in the bathroom? Use night lights. Install grab bars by the toilet and in the tub and shower. Do not use towel bars as grab bars. Use non-skid mats or decals in the tub or shower. If you need to sit down in the shower, use a plastic, non-slip stool. Keep the floor dry. Clean up any water that spills on the floor as soon as it happens. Remove soap buildup in the tub or shower regularly. Attach bath mats securely with double-sided non-slip rug tape. Do not have throw rugs and other things on the floor that can make you trip. What can I do in the bedroom? Use night lights. Make sure that you have a light by your bed that is easy to reach. Do not use any sheets or blankets that are too big for your bed. They should not hang down onto the floor. Have a firm chair that has side arms. You can use this for support while  you get dressed. Do not have throw rugs and other things on the floor that can make you trip. What can I do in the kitchen? Clean up any spills right away. Avoid walking on wet floors. Keep items that you use a lot in easy-to-reach places. If you need to reach something above you, use a strong step stool that has a grab bar. Keep electrical cords out of the way. Do not use floor polish or wax that makes floors slippery. If you must use wax, use non-skid floor wax. Do not have throw rugs and other things on the floor that can make you trip. What can I do with my stairs? Do not leave any items on the stairs. Make sure that there are handrails on both sides of the stairs and use them. Fix handrails that are broken or loose. Make sure that handrails are as long as the stairways. Check any carpeting to make sure that it is firmly attached to the stairs. Fix any carpet that is loose or worn. Avoid having throw rugs at the top or bottom of the stairs. If you do have throw rugs, attach them to the floor with carpet tape. Make sure that you have a light switch at the top of the stairs and the bottom of the stairs. If you do not have them, ask someone to add them  for you. What else can I do to help prevent falls? Wear shoes that: Do not have high heels. Have rubber bottoms. Are comfortable and fit you well. Are closed at the toe. Do not wear sandals. If you use a stepladder: Make sure that it is fully opened. Do not climb a closed stepladder. Make sure that both sides of the stepladder are locked into place. Ask someone to hold it for you, if possible. Clearly mark and make sure that you can see: Any grab bars or handrails. First and last steps. Where the edge of each step is. Use tools that help you move around (mobility aids) if they are needed. These include: Canes. Walkers. Scooters. Crutches. Turn on the lights when you go into a dark area. Replace any light bulbs as soon as they burn  out. Set up your furniture so you have a clear path. Avoid moving your furniture around. If any of your floors are uneven, fix them. If there are any pets around you, be aware of where they are. Review your medicines with your doctor. Some medicines can make you feel dizzy. This can increase your chance of falling. Ask your doctor what other things that you can do to help prevent falls. This information is not intended to replace advice given to you by your health care provider. Make sure you discuss any questions you have with your health care provider. Document Released: 05/27/2009 Document Revised: 01/06/2016 Document Reviewed: 09/04/2014 Elsevier Interactive Patient Education  2017 [employer].

## 2021-03-18 NOTE — Progress Notes (Signed)
I connected with Trystian Crisanto today by telephone and verified that I am speaking with the correct person using two identifiers. Location patient: home Location provider: work Persons participating in the virtual visit: Bryn, Perkin LPN.   I discussed the limitations, risks, security and privacy concerns of performing an evaluation and management service by telephone and the availability of in person appointments. I also discussed with the patient that there may be a patient responsible charge related to this service. The patient expressed understanding and verbally consented to this telephonic visit.    Interactive audio and video telecommunications were attempted between this provider and patient, however failed, due to patient having technical difficulties OR patient did not have access to video capability.  We continued and completed visit with audio only.     Vital signs may be patient reported or missing.  Subjective:   Mark [name] is a 60 y.o. male who presents for Medicare Annual/Subsequent preventive examination.  Review of Systems     Cardiac Risk Factors include: advanced age (>80men, >16 women);hypertension;male gender;smoking/ tobacco exposure     Objective:    Today's Vitals   03/18/21 1427  Weight: 155 lb (70.3 kg)  Height: 5\' 9"  (1.753 m)   Body mass index is 22.89 kg/m.  Advanced Directives 03/18/2021 09/06/2020 09/03/2020 09/02/2020 02/09/2020 05/15/2019 07/04/2017  Does Patient Have a Medical Advance Directive? No No No No No No No  Would patient like information on creating a medical advance directive? - No - Patient declined - No - Patient declined - No - Patient declined No - Patient declined    Current Medications (verified) Outpatient Encounter Medications as of 03/18/2021  Medication Sig   amLODipine (NORVASC) 10 MG tablet Take 1 tablet (10 mg total) by mouth daily.   aspirin EC 81 MG tablet Take 1 tablet (81 mg total) by mouth  daily. Swallow whole.   citalopram (CELEXA) 20 MG tablet Take 1 tablet (20 mg total) by mouth daily.   cyanocobalamin 1000 MCG tablet Take 1 tablet (1,000 mcg total) by mouth daily.   ferrous sulfate 325 (65 FE) MG EC tablet Take 1 tablet (325 mg total) by mouth daily with breakfast.   Melatonin 10 MG TABS Take 10 mg by mouth at bedtime.   metoprolol tartrate (LOPRESSOR) 25 MG tablet Take 1 tablet (25 mg total) by mouth 2 (two) times daily.   omeprazole (PRILOSEC) 20 MG capsule TAKE 1 CAPSULE BY MOUTH EVERY DAY   ondansetron (ZOFRAN) 4 MG tablet Take 1 tablet (4 mg total) by mouth every 8 (eight) hours as needed for nausea or vomiting.   pravastatin (PRAVACHOL) 20 MG tablet Take 1 tablet (20 mg total) by mouth daily.   sucralfate (CARAFATE) 1 g tablet TAKE 1 TABLET (1 G TOTAL) BY MOUTH 4 (FOUR) TIMES DAILY - WITH MEALS AND AT BEDTIME.   tamsulosin (FLOMAX) 0.4 MG CAPS capsule Take 1 capsule (0.4 mg total) by mouth daily.   No facility-administered encounter medications on file as of 03/18/2021.    Allergies (verified) Codeine   History: Past Medical History:  Diagnosis Date   Depression    History of kidney stones    Hypertension    Stroke [facility])    Past Surgical History:  Procedure Laterality Date   APPENDECTOMY     bowel obstruction     COLONOSCOPY N/A 09/07/2020   Procedure: COLONOSCOPY;  Surgeon: 09/09/2020, MD;  Location: [facility] ENDOSCOPY;  Service: Endoscopy;  Laterality: N/A;  ESOPHAGOGASTRODUODENOSCOPY (EGD) WITH PROPOFOL N/A 09/03/2020   Procedure: ESOPHAGOGASTRODUODENOSCOPY (EGD) WITH PROPOFOL;  Surgeon: Toney ReilVanga, Rohini Reddy, MD;  [facility];  Service: Gastroenterology;  Laterality: N/A;   EXTRACORPOREAL SHOCK WAVE LITHOTRIPSY Right 05/15/2019   Procedure: EXTRACORPOREAL SHOCK WAVE LITHOTRIPSY (ESWL);  Surgeon: Malen GauzeMcKenzie, Patrick L, MD;  Location: [facility] ORS;  Service: Urology;  Laterality: Right;   Family History  Problem Relation Age of Onset   Heart  disease Father    Seizures Brother    Social History   Socioeconomic History   Marital status: Divorced    Spouse name: Not on file   Number of children: Not on file   Years of education: Not on file   Highest education level: Not on file  Occupational History   Occupation: retired  Tobacco Use   Smoking status: Every Day    Packs/day: 1.00    Years: 43.00    Pack years: 43.00    Types: Cigarettes   Smokeless tobacco: Never  Vaping Use   Vaping Use: Never used  Substance and Sexual Activity   Alcohol use: Not Currently    Alcohol/week: 0.0 standard drinks   Drug use: Not Currently   Sexual activity: Not on file  Other Topics Concern   Not on file  Social History Narrative   Not on file   Social Determinants of Health   Financial Resource Strain: Low Risk    Difficulty of Paying Living Expenses: Not hard at all  Food Insecurity: No Food Insecurity   Worried About Programme researcher, broadcasting/film/videounning Out of Food in the Last Year: Never true   Ran Out of Food in the Last Year: Never true  Transportation Needs: No Transportation Needs   Lack of Transportation (Medical): No   Lack of Transportation (Non-Medical): No  Physical Activity: Sufficiently Active   Days of Exercise per Week: 7 days   Minutes of Exercise per Session: 30 min  Stress: No Stress Concern Present   Feeling of Stress : Not at all  Social Connections: Not on file    Tobacco Counseling Ready to quit: No Counseling given: Not Answered   Clinical Intake:  Pre-visit preparation completed: Yes  Pain : No/denies pain     Nutritional Status: BMI of 19-24  Normal Nutritional Risks: None Diabetes: No  How often do you need to have someone help you when you read instructions, pamphlets, or other written materials from your doctor or pharmacy?: 1 - Never What is the last grade level you completed in school?: 12th grade  Diabetic? no  Interpreter Needed?: No  Information entered by :: NAllen LPN   Activities of Daily  Living In your present state of health, do you have any difficulty performing the following activities: 03/18/2021 09/06/2020  Hearing? N N  Vision? N N  Difficulty concentrating or making decisions? N N  Walking or climbing stairs? N N  Dressing or bathing? N N  Doing errands, shopping? N N  Preparing Food and eating ? N -  Using the Toilet? N -  In the past six months, have you accidently leaked urine? N -  Do you have problems with loss of bowel control? N -  Managing your Medications? Y -  Comment daughter in Armed forces logistics/support/administrative officerlaw manages -  Managing your Finances? N -  Housekeeping or managing your Housekeeping? N -  Some recent data might be hidden    Patient Care Team: Marjie Skiffannady, Jolene T, NP as PCP - General (Nurse Practitioner) Gabriel CirriWicker, Cheryl, NP as PCP -  Family Medicine (Nurse Practitioner) Marlowe Sax, RN as Case Manager (General Practice) Bridgett Larsson, LCSW as Social Worker (Licensed Clinical Social Worker)  Indicate any recent Medical Services you may have received from other than [facility] providers in the past year (date may be approximate).     Assessment:   This is a routine wellness examination for Miron.  Hearing/Vision screen Vision Screening - Comments:: No regular eye exams,   Dietary issues and exercise activities discussed: Current Exercise Habits: Home exercise routine, Type of exercise: walking, Time (Minutes): 30, Frequency (Times/Week): 7, Weekly Exercise (Minutes/Week): 210   Goals Addressed             This Visit's Progress    Patient Stated       03/18/2021, wants to feel better       Depression Screen PHQ 2/9 Scores 03/18/2021 12/07/2020 11/15/2020 09/15/2020 02/09/2020 05/09/2019 06/04/2018  PHQ - 2 Score 0 6 3 0 0 2 0  PHQ- 9 Score - 7 6 - - 6 2    Fall Risk Fall Risk  03/18/2021 09/15/2020 02/09/2020 06/04/2018 09/18/2017  Falls in the past year? 1 0 0 No Yes  Comment tripped over the coffee table - - - -  Number falls in past yr: 0 - - - 1  Injury with Fall?  0 - - - Yes  Risk for fall due to : Medication side effect - Medication side effect - History of fall(s)  Follow up Falls evaluation completed;Education provided;Falls prevention discussed - Falls evaluation completed;Education provided;Falls prevention discussed - Falls evaluation completed    FALL RISK PREVENTION PERTAINING TO THE HOME:  Any stairs in or around the home? Yes  If so, are there any without handrails? No  Home free of loose throw rugs in walkways, pet beds, electrical cords, etc? Yes  Adequate lighting in your home to reduce risk of falls? Yes   ASSISTIVE DEVICES UTILIZED TO PREVENT FALLS:  Life alert? No  Use of a cane, walker or w/c? No  Grab bars in the bathroom? Yes  Shower chair or bench in shower? No  Elevated toilet seat or a handicapped toilet? No   TIMED UP AND GO:  Was the test performed? No .      Cognitive Function:     6CIT Screen 03/18/2021 05/30/2017  What Year? 0 points 0 points  What month? 0 points 0 points  What time? 0 points 0 points  Count back from 20 2 points 0 points  Months in reverse 4 points 0 points  Repeat phrase 6 points 2 points  Total Score 12 2    Immunizations Immunization History  Administered Date(s) Administered   Tdap 05/17/2015    TDAP status: Up to date  Flu Vaccine status: Declined, Education has been provided regarding the importance of this vaccine but patient still declined. Advised may receive this vaccine at local pharmacy or Health Dept. Aware to provide a copy of the vaccination record if obtained from local pharmacy or Health Dept. Verbalized acceptance and understanding.  Pneumococcal vaccine status: Declined,  Education has been provided regarding the importance of this vaccine but patient still declined. Advised may receive this vaccine at local pharmacy or Health Dept. Aware to provide a copy of the vaccination record if obtained from local pharmacy or Health Dept. Verbalized acceptance and  understanding.   Covid-19 vaccine status: Declined, Education has been provided regarding the importance of this vaccine but patient still declined. Advised may receive this vaccine  at local pharmacy or Health Dept.or vaccine clinic. Aware to provide a copy of the vaccination record if obtained from local pharmacy or Health Dept. Verbalized acceptance and understanding.  Qualifies for Shingles Vaccine? Yes   Zostavax completed No   Shingrix Completed?: No.    Education has been provided regarding the importance of this vaccine. Patient has been advised to call insurance company to determine out of pocket expense if they have not yet received this vaccine. Advised may also receive vaccine at local pharmacy or Health Dept. Verbalized acceptance and understanding.  Screening Tests Health Maintenance  Topic Date Due   INFLUENZA VACCINE  03/14/2021   COVID-19 Vaccine (1) 04/03/2021 (Originally 04/27/1966)   Zoster Vaccines- Shingrix (1 of 2) 06/18/2021 (Originally 04/28/1979)   Pneumococcal Vaccine 42-56 Years old (1 - PCV) 03/18/2022 (Originally 04/27/1966)   PNEUMOCOCCAL POLYSACCHARIDE VACCINE AGE 54-64 HIGH RISK  03/18/2022 (Originally 04/27/1962)   TETANUS/TDAP  05/16/2025   COLONOSCOPY (Pts 45-51yrs Insurance coverage will need to be confirmed)  09/07/2030   Hepatitis C Screening  Completed   HIV Screening  Completed   HPV VACCINES  Aged Out    Health Maintenance  Health Maintenance Due  Topic Date Due   INFLUENZA VACCINE  03/14/2021    Colorectal cancer screening: declines  Lung Cancer Screening: (Low Dose CT Chest recommended if Age 21-80 years, 30 pack-year currently smoking OR have quit w/in 15years.) does qualify.   Lung Cancer Screening Referral: CT Scan 05/11/2020  Additional Screening:  Hepatitis C Screening: does qualify; Completed 06/10/2019  Vision Screening: Recommended annual ophthalmology exams for early detection of glaucoma and other disorders of the eye. Is the  patient up to date with their annual eye exam?  No  Who is the provider or what is the name of the office in which the patient attends annual eye exams? noone If pt is not established with a provider, would they like to be referred to a provider to establish care? No .   Dental Screening: Recommended annual dental exams for proper oral hygiene  Community Resource Referral / Chronic Care Management: CRR required this visit?  Yes   CCM required this visit?  No      Plan:     I have personally reviewed and noted the following in the patient's chart:   Medical and social history Use of alcohol, tobacco or illicit drugs  Current medications and supplements including opioid prescriptions. Patient is not currently taking opioid prescriptions. Functional ability and status Nutritional status Physical activity Advanced directives List of other physicians Hospitalizations, surgeries, and ER visits in previous 12 months Vitals Screenings to include cognitive, depression, and falls Referrals and appointments  In addition, I have reviewed and discussed with patient certain preventive protocols, quality metrics, and best practice recommendations. A written personalized care plan for preventive services as well as general preventive health recommendations were provided to patient.     Barb Merino, LPN   02/12/946   Nurse Notes:

## 2021-03-21 ENCOUNTER — Telehealth: Payer: Self-pay

## 2021-03-21 ENCOUNTER — Ambulatory Visit (INDEPENDENT_AMBULATORY_CARE_PROVIDER_SITE_OTHER): Payer: Medicare HMO | Admitting: Licensed Clinical Social Worker

## 2021-03-21 DIAGNOSIS — F32A Depression, unspecified: Secondary | ICD-10-CM

## 2021-03-21 DIAGNOSIS — F419 Anxiety disorder, unspecified: Secondary | ICD-10-CM

## 2021-03-21 DIAGNOSIS — I10 Essential (primary) hypertension: Secondary | ICD-10-CM | POA: Diagnosis not present

## 2021-03-21 NOTE — Telephone Encounter (Signed)
   Telephone encounter was:  Unsuccessful.  03/21/2021 Name: Mark [name] MRN: [id number] DOB: 08-Jul-1961  Unsuccessful outbound call made today to assist with:  Attempted to call patient to confirm where and what time is appointment is tomorrow since it is not in Epic.  Call to home phone was disconnected and unable to leave message on cell phone. Attempted to call home phone a second time line is busy.  I will attempt to call again today.  Outreach Attempt:  1st Attempt   Tate Jerkins, AAS Paralegal, [facility] Care Guide  Embedded Care Coordination [facility]  Care Management  [address] [place], [place] [address] ??[email or web address]  ?? [phone]   [email or web address]

## 2021-03-21 NOTE — Telephone Encounter (Signed)
   Telephone encounter was:  Successful.  03/21/2021 Name: ALBERTUS CHIARELLI MRN: [id number] DOB: [birth year]  MUAZ SHOREY is a 60 y.o. year old male who is a primary care patient of Cannady, Dorie Rank, NP . The community resource team was consulted for assistance with Transportation Needs   Care guide performed the following interventions:  Closing regerral per message from [facility] LPN, patient is unable to go to 03/22/21 appointment.  Follow Up Plan:  No further follow up planned at this time. The patient has been provided with needed resources.  Joline Encalada, AAS Paralegal, [facility] Care Guide  Embedded Care Coordination [place]  Care Management  [address] [place], [place] [address] ??[email or web address]  ?? [phone]   [email or web address]

## 2021-03-21 NOTE — Telephone Encounter (Signed)
Noted  

## 2021-03-22 ENCOUNTER — Other Ambulatory Visit: Payer: Self-pay

## 2021-03-22 ENCOUNTER — Encounter: Payer: Self-pay | Admitting: Nurse Practitioner

## 2021-03-22 ENCOUNTER — Ambulatory Visit (INDEPENDENT_AMBULATORY_CARE_PROVIDER_SITE_OTHER): Payer: Medicare HMO | Admitting: Nurse Practitioner

## 2021-03-22 VITALS — BP 119/83 | HR 74 | Temp 98.5°F | Ht 68.9 in | Wt 145.0 lb

## 2021-03-22 DIAGNOSIS — J069 Acute upper respiratory infection, unspecified: Secondary | ICD-10-CM

## 2021-03-22 NOTE — Progress Notes (Signed)
Acute Office Visit  Subjective:    Patient ID: Mark [name], male    DOB: 07/03/1961, 60 y.o.   MRN: [id number]  Chief Complaint  Patient presents with   chest congestion    Started on Saturday has been taking mucinex    HPI Patient is in today for chest congestion and fatigue  UPPER RESPIRATORY TRACT INFECTION  Worst symptom: fatigue Fever: no Cough: yes Shortness of breath: no Wheezing: no Chest pain: no Chest tightness: no Chest congestion: yes Nasal congestion: yes Runny nose: yes Post nasal drip: no Sneezing: no Sore throat: no Swollen glands: no Sinus pressure: no Headache: yes Face pain: no Toothache: no Ear pain: no  Ear pressure: no  Eyes red/itching:no Eye drainage/crusting: no  Vomiting: no Rash: no Fatigue: yes Sick contacts: yes-friends had covid-19 Strep contacts: no  Context: fluctuating Recurrent sinusitis: no Relief with OTC cold/cough medications: no  Treatments attempted: mucinex    Past Medical History:  Diagnosis Date   Depression    History of kidney stones    Hypertension    Stroke [facility])     Past Surgical History:  Procedure Laterality Date   APPENDECTOMY     bowel obstruction     COLONOSCOPY N/A 09/07/2020   Procedure: COLONOSCOPY;  Surgeon: Pasty Spillers, MD;  Location: [facility] ENDOSCOPY;  Service: Endoscopy;  Laterality: N/A;   ESOPHAGOGASTRODUODENOSCOPY (EGD) WITH PROPOFOL N/A 09/03/2020   Procedure: ESOPHAGOGASTRODUODENOSCOPY (EGD) WITH PROPOFOL;  Surgeon: Toney Reil, MD;  Location: [facility] ENDOSCOPY;  Service: Gastroenterology;  Laterality: N/A;   EXTRACORPOREAL SHOCK WAVE LITHOTRIPSY Right 05/15/2019   Procedure: EXTRACORPOREAL SHOCK WAVE LITHOTRIPSY (ESWL);  Surgeon: Malen Gauze, MD;  Location: [facility] ORS;  Service: Urology;  Laterality: Right;    Family History  Problem Relation Age of Onset   Heart disease Father    Seizures Brother     Social History   Socioeconomic History   Marital  status: Divorced    Spouse name: Not on file   Number of children: Not on file   Years of education: Not on file   Highest education level: Not on file  Occupational History   Occupation: retired  Tobacco Use   Smoking status: Every Day    Packs/day: 1.00    Years: 43.00    Pack years: 43.00    Types: Cigarettes   Smokeless tobacco: Never  Vaping Use   Vaping Use: Never used  Substance and Sexual Activity   Alcohol use: Not Currently    Alcohol/week: 0.0 standard drinks   Drug use: Not Currently   Sexual activity: Not on file  Other Topics Concern   Not on file  Social History Narrative   Not on file   Social Determinants of Health   Financial Resource Strain: Low Risk    Difficulty of Paying Living Expenses: Not hard at all  Food Insecurity: No Food Insecurity   Worried About Programme researcher, broadcasting/film/video in the Last Year: Never true   Ran Out of Food in the Last Year: Never true  Transportation Needs: No Transportation Needs   Lack of Transportation (Medical): No   Lack of Transportation (Non-Medical): No  Physical Activity: Sufficiently Active   Days of Exercise per Week: 7 days   Minutes of Exercise per Session: 30 min  Stress: No Stress Concern Present   Feeling of Stress : Not at all  Social Connections: Not on file  Intimate Partner Violence: Not on file    Outpatient Medications  Prior to Visit  Medication Sig Dispense Refill   amLODipine (NORVASC) 10 MG tablet Take 1 tablet (10 mg total) by mouth daily. 90 tablet 4   aspirin EC 81 MG tablet Take 1 tablet (81 mg total) by mouth daily. Swallow whole. 150 tablet 2   citalopram (CELEXA) 20 MG tablet Take 1 tablet (20 mg total) by mouth daily. 90 tablet 4   cyanocobalamin 1000 MCG tablet Take 1 tablet (1,000 mcg total) by mouth daily. 90 tablet 4   ferrous sulfate 325 (65 FE) MG EC tablet Take 1 tablet (325 mg total) by mouth daily with breakfast. 90 tablet 4   Melatonin 10 MG TABS Take 10 mg by mouth at bedtime. 90  tablet 4   metoprolol tartrate (LOPRESSOR) 25 MG tablet Take 1 tablet (25 mg total) by mouth 2 (two) times daily. 180 tablet 4   omeprazole (PRILOSEC) 20 MG capsule TAKE 1 CAPSULE BY MOUTH EVERY DAY 90 capsule 4   ondansetron (ZOFRAN) 4 MG tablet Take 1 tablet (4 mg total) by mouth every 8 (eight) hours as needed for nausea or vomiting. 8 tablet 0   pravastatin (PRAVACHOL) 20 MG tablet Take 1 tablet (20 mg total) by mouth daily. 90 tablet 4   sucralfate (CARAFATE) 1 g tablet TAKE 1 TABLET (1 G TOTAL) BY MOUTH 4 (FOUR) TIMES DAILY - WITH MEALS AND AT BEDTIME. 120 tablet 1   tamsulosin (FLOMAX) 0.4 MG CAPS capsule Take 1 capsule (0.4 mg total) by mouth daily. 90 capsule 4   No facility-administered medications prior to visit.    Allergies  Allergen Reactions   Codeine Hives    Review of Systems  Constitutional:  Positive for fatigue. Negative for fever.  HENT:  Positive for congestion and rhinorrhea. Negative for ear pain, postnasal drip and sore throat.   Eyes: Negative.   Respiratory:  Positive for cough. Negative for shortness of breath.   Cardiovascular: Negative.   Gastrointestinal: Negative.   Genitourinary: Negative.   Musculoskeletal: Negative.   Skin: Negative.   Neurological: Negative.       Objective:    Physical Exam Vitals and nursing note reviewed.  Constitutional:      General: He is not in acute distress.    Appearance: Normal appearance.  HENT:     Head: Normocephalic.  Eyes:     Conjunctiva/sclera: Conjunctivae normal.  Cardiovascular:     Rate and Rhythm: Normal rate and regular rhythm.     Pulses: Normal pulses.     Heart sounds: Normal heart sounds.  Pulmonary:     Effort: Pulmonary effort is normal.     Breath sounds: Normal breath sounds.  Abdominal:     Palpations: Abdomen is soft.     Tenderness: There is no abdominal tenderness.  Musculoskeletal:     Cervical back: Normal range of motion.  Skin:    General: Skin is warm.  Neurological:      General: No focal deficit present.     Mental Status: He is alert and oriented to person, place, and time.  Psychiatric:        Mood and Affect: Mood normal.        Behavior: Behavior normal.        Thought Content: Thought content normal.        Judgment: Judgment normal.    BP 119/83   Pulse 74   Temp 98.5 F (36.9 C) (Oral)   Ht 5' 8.9" (1.75 m)   Wt 145 lb (  65.8 kg)   SpO2 97%   BMI 21.48 kg/m  Wt Readings from Last 3 Encounters:  03/22/21 145 lb (65.8 kg)  03/18/21 155 lb (70.3 kg)  01/04/21 154 lb 9.6 oz (70.1 kg)    Health Maintenance Due  Topic Date Due   INFLUENZA VACCINE  03/14/2021    There are no preventive care reminders to display for this patient.   Lab Results  Component Value Date   TSH 3.950 09/01/2020   Lab Results  Component Value Date   WBC 8.0 09/15/2020   HGB 11.1 (L) 09/15/2020   HCT 34.9 (L) 09/15/2020   MCV 94 09/15/2020   PLT 401 09/15/2020   Lab Results  Component Value Date   NA 142 09/15/2020   K 4.8 09/15/2020   CO2 21 09/15/2020   GLUCOSE 114 (H) 09/15/2020   BUN 10 09/15/2020   CREATININE 1.12 09/15/2020   BILITOT 0.7 09/02/2020   ALKPHOS 47 09/02/2020   AST 15 09/02/2020   ALT 15 09/02/2020   PROT 6.3 (L) 09/02/2020   ALBUMIN 3.5 09/02/2020   CALCIUM 9.0 09/15/2020   ANIONGAP 12 09/07/2020   Lab Results  Component Value Date   CHOL 142 06/23/2020   Lab Results  Component Value Date   HDL 52 06/23/2020   Lab Results  Component Value Date   LDLCALC 73 06/23/2020   Lab Results  Component Value Date   TRIG 87 06/23/2020   Lab Results  Component Value Date   CHOLHDL 4.5 03/19/2018   Lab Results  Component Value Date   HGBA1C 5.6 06/23/2020       Assessment & Plan:   Problem List Items Addressed This Visit   None Visit Diagnoses     Upper respiratory tract infection, unspecified type    -  Primary   Test for covid-19. He is not vaccinated. If positive will treat with molnupiravir. Discussed  isolation, increased fluids, and rest. F/U if symptoms worsen   Relevant Orders   Novel Coronavirus, NAA ([facility])        No orders of the defined types were placed in this encounter.    Gerre Scull, NP

## 2021-03-23 ENCOUNTER — Ambulatory Visit: Payer: Self-pay | Admitting: *Deleted

## 2021-03-23 MED ORDER — PREDNISONE 10 MG PO TABS: ORAL_TABLET | ORAL | 0 refills | Status: DC

## 2021-03-23 NOTE — Telephone Encounter (Signed)
Reason for Disposition  [1] Caller has URGENT medicine question about med that PCP or specialist prescribed AND [2] triager unable to answer question    For molnupiravir if Covid positive.  Test result still pending.  Answer Assessment - Initial Assessment Questions 1. NAME of MEDICATION: "What medicine are you calling about?"     Molnupiravir for Covid.   Seen by Rodman Pickle, NP yesterday 03/22/2021 and was tested for Covid.   If it's positive he is to be started on the antiviral. 2. QUESTION: "What is your question?" (e.g., double dose of medicine, side effect)     Did my test result come back yet?   I feel terrible.   I need to be started on that medicine. 3. PRESCRIBING HCP: "Who prescribed it?" Reason: if prescribed by specialist, call should be referred to that group.     Lauren McElweeAGNP-C with [facility]. 4. SYMPTOMS: "Do you have any symptoms?"     Yes I saw her yesterday.   Having the same symptoms but I'm feeling so bad.   Please can she start me on that medicine? 5. SEVERITY: If symptoms are present, ask "Are they mild, moderate or severe?"     Severe per how pt feeling 6. PREGNANCY:  "Is there any chance that you are pregnant?" "When was your last menstrual period?"     N/A  Protocols used: Medication Question Call-A-AH

## 2021-03-23 NOTE — Telephone Encounter (Signed)
Patient notified

## 2021-03-23 NOTE — Telephone Encounter (Signed)
Covid test should be back today. I will wait a few more hours for the results, and if not in by 5pm I will send in non-covid medication as today is the last day he could start it.

## 2021-03-23 NOTE — Chronic Care Management (AMB) (Signed)
Chronic Care Management    Clinical Social Work Note  03/23/2021 Name: Mark [name] MRN: [id number] DOB: 05/04/1961  Mark [name] is a 60 y.o. year old male who is a primary care patient of Cannady, Dorie Rank, NP. The CCM team was consulted to assist the patient with chronic disease management and/or care coordination needs related to: Appointment Scheduling needs.   Engaged with patient by telephone for follow up visit in response to provider referral for social work chronic care management and care coordination services.   Consent to Services:  The patient was given information about Chronic Care Management services, agreed to services, and gave verbal consent prior to initiation of services.  Please see initial visit note for detailed documentation.   Patient agreed to services and consent obtained.   Assessment: Review of patient past medical history, allergies, medications, and health status, including review of relevant consultants reports was performed today as part of a comprehensive evaluation and provision of chronic care management and care coordination services.     SDOH (Social Determinants of Health) assessments and interventions performed:    Advanced Directives Status: Not addressed in this encounter.  CCM Care Plan  Allergies  Allergen Reactions   Codeine Hives    Outpatient Encounter Medications as of 03/21/2021  Medication Sig   amLODipine (NORVASC) 10 MG tablet Take 1 tablet (10 mg total) by mouth daily.   aspirin EC 81 MG tablet Take 1 tablet (81 mg total) by mouth daily. Swallow whole.   citalopram (CELEXA) 20 MG tablet Take 1 tablet (20 mg total) by mouth daily.   cyanocobalamin 1000 MCG tablet Take 1 tablet (1,000 mcg total) by mouth daily.   ferrous sulfate 325 (65 FE) MG EC tablet Take 1 tablet (325 mg total) by mouth daily with breakfast.   Melatonin 10 MG TABS Take 10 mg by mouth at bedtime.   metoprolol tartrate (LOPRESSOR) 25 MG tablet Take 1  tablet (25 mg total) by mouth 2 (two) times daily.   omeprazole (PRILOSEC) 20 MG capsule TAKE 1 CAPSULE BY MOUTH EVERY DAY   ondansetron (ZOFRAN) 4 MG tablet Take 1 tablet (4 mg total) by mouth every 8 (eight) hours as needed for nausea or vomiting.   pravastatin (PRAVACHOL) 20 MG tablet Take 1 tablet (20 mg total) by mouth daily.   sucralfate (CARAFATE) 1 g tablet TAKE 1 TABLET (1 G TOTAL) BY MOUTH 4 (FOUR) TIMES DAILY - WITH MEALS AND AT BEDTIME.   tamsulosin (FLOMAX) 0.4 MG CAPS capsule Take 1 capsule (0.4 mg total) by mouth daily.   No facility-administered encounter medications on file as of 03/21/2021.    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   History of kidney stones 05/09/2019   Pulmonary nodule 05/09/2019   Paraseptal emphysema (HCC) 05/09/2019   Nicotine dependence, cigarettes, w unsp disorders 04/30/2019   Hyperlipidemia LDL goal <70 09/05/2018   Elevated hemoglobin A1c 09/03/2018   Cognitive impairment 12/15/2016   History of opioid abuse (HCC) 12/30/2015   History of intestinal obstruction 05/03/2015   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   Anxiety and depression 05/03/2015   Hypertension 05/03/2015   Aphasia with stroke 05/03/2015   History of CVA (cerebrovascular accident) 05/03/2015    Conditions to be addressed/monitored: Anxiety and Depression; Mental Health  Concerns  and Appointment scheduling needs  Care Plan : General Social Work (Adult)  Updates made by Bridgett Larsson, LCSW since 03/23/2021 12:00 AM     Problem: Coping Skills (General Plan of Care)      Long-Range Goal: Coping Skills Enhanced   Start Date: 11/19/2020  This Visit's Progress: On track  Recent Progress: On track  Priority: Medium  Note:   Timeframe:  Long-Range  Goal Priority:  Medium Start Date:     11/19/20                        Expected End Date:    04/13/21                   Current barriers:   Chronic Mental Health needs related to depression, grief and anxiety Needs Support, Education, and Care Coordination in order to meet unmet mental health needs. Lacks knowledge of where and how to connect for grief support resources  Clinical Goal(s): Over the next 120 days, patient will work with SW to reduce or manage symptoms of depression and grief and increase knowledge and/or ability of: coping skills, healthy habits, self-management skills, and stress reduction.until connected for ongoing counseling.  Clinical Interventions:  Assessed patient's previous treatment, needs, coping skills, current treatment, support system and barriers to care Patient interviewed and appropriate assessments performed or reviewed: brief mental health assessment;Suicidal Ideation/Homicidal Ideation: No Provided basic mental health support, education and intervention Patient reports that he has been feeling bad for a few days. States that he needs to see a doctor CCM LCSW informed patient that transportation for tomorrow's appt was cancelled. Patient became upset, stating that he would have found transportation; therefore, he would like appointment to be rescheduled CCM LCSW collaborated with Retia Passe, [facility] Care Guide who shared that she was permitted to cancel transportation assistance from a provider. There was no observed appointment on file in EPIC for 03/22/21 CCM LCSW contacted [facility], Clydie Braun, who agreed to contact patient to schedule an appointment to further assess.  Patient reports "I'm okay" when CCM LCSW inquired about anxiety and/or depression symptoms triggered by grief. Patient shared that he receives strong support from family Patient described sleep as "so-so", due to waking up throughout the night periodically. He is compliant with medication  management to assist with symptoms. Denies any adverse side effects CCM LCSW discussed 1-2 strategies to assist with sleep hygiene Patient reports that he is using healthy coping skills to assist with symptom management Patient reports that he has not heard from PT or OT yet. Understands that this may take a couple of weeks to schedule Collaborated with [facility] on 11/19/20 regarding referral for grief counseling-Patient reports that he has not heard from [place]. States that he is not interested in grief support services at this moment Other interventions include: Solution Focused Emotional/Supportive Counseling  Collaboration with PCP regarding development and update of comprehensive plan of care as evidenced by provider attestation and co-signature Inter-disciplinary care team collaboration (see longitudinal plan of care) Patient Goals: Attend all scheduled appointments with Providers Contact office with any questions or concerns Continue compliance with med management Continue utilizing healthy coping skills to assist with management of symptoms     Jenel Lucks, MSW, LCSW [facility] Care Management [place]  [facility] [email or web address] Phone [phone] 9:21 AM

## 2021-03-23 NOTE — Patient Instructions (Addendum)
Visit Information   Goals Addressed             This Visit's Progress    Track and Manage My Symptoms-Depression   On track    Timeframe:  Long-Range Goal Priority:  Medium Start Date:     11/19/20                        Expected End Date:    06/13/21                   Follow Up Date -30 days from 03/21/21   Patient Goals: Attend all scheduled appointments with Providers Contact office with any questions or concerns Continue compliance with med management Continue utilizing healthy coping skills to assist with management of symptoms        Patient verbalizes understanding of instructions provided today.   Telephone follow up appointment with care management team member scheduled for:04/25/21  Jenel Lucks, MSW, LCSW [facility] Care Management [facility]  [facility] [email or web address] Phone [phone] 9:22 AM

## 2021-03-23 NOTE — Telephone Encounter (Signed)
Pt was seen yesterday 03/22/2021 by Rodman Pickle, AGNP-C for a upper respiratory infection.   He was tested for Covid in the office.  She had mentioned starting him on an antiviral if his test was positive.    He called in saying he is feeling terrible and can she start him on the medication (after I told him the Covid test result wasn't back).   I let him know I would send Lauren a message and see what she advises.  He said he is having the same symptoms he saw her for yesterday but is feeling terrible.  I sent a high priority note to [facility] for further disposition by Rodman Pickle.

## 2021-03-23 NOTE — Addendum Note (Signed)
Addended by: Rodman Pickle A on: 03/23/2021 04:47 PM   Modules accepted: Orders

## 2021-03-23 NOTE — Telephone Encounter (Signed)
Patient notified of Lauren's response. Routing back to provider for for results.

## 2021-03-24 ENCOUNTER — Ambulatory Visit: Payer: Medicare HMO

## 2021-03-24 ENCOUNTER — Ambulatory Visit: Payer: Medicare HMO | Admitting: Occupational Therapy

## 2021-03-24 LAB — SARS-COV-2, NAA 2 DAY TAT

## 2021-03-24 LAB — NOVEL CORONAVIRUS, NAA: SARS-CoV-2, NAA: DETECTED — AB

## 2021-03-25 ENCOUNTER — Other Ambulatory Visit: Payer: Self-pay

## 2021-03-25 ENCOUNTER — Ambulatory Visit: Payer: Self-pay | Admitting: *Deleted

## 2021-03-25 ENCOUNTER — Ambulatory Visit (INDEPENDENT_AMBULATORY_CARE_PROVIDER_SITE_OTHER): Payer: Medicare HMO | Admitting: Nurse Practitioner

## 2021-03-25 ENCOUNTER — Telehealth: Payer: Medicare HMO | Admitting: Nurse Practitioner

## 2021-03-25 ENCOUNTER — Encounter: Payer: Self-pay | Admitting: Nurse Practitioner

## 2021-03-25 DIAGNOSIS — U071 COVID-19: Secondary | ICD-10-CM | POA: Diagnosis not present

## 2021-03-25 MED ORDER — AZITHROMYCIN 250 MG PO TABS: ORAL_TABLET | ORAL | 0 refills | Status: DC

## 2021-03-25 NOTE — Telephone Encounter (Signed)
Reason for Disposition  [1] MILD difficulty breathing (e.g., minimal/no SOB at rest, SOB with walking, pulse <100) AND [2] NEW-onset or WORSE than normal    Covid positive.   Still feels bad even with the prednisone.   Feeling short of breath  Answer Assessment - Initial Assessment Questions 1. RESPIRATORY STATUS: "Describe your breathing?" (e.g., wheezing, shortness of breath, unable to speak, severe coughing)      I can't breath.   I'm taking the prednisone.   I have 2 left.   I can't hardly breath.   I need more prednisone.   I feel so terrible.   I'm coughing up green mucus.   No fever.  I don't have a thermometer.     I'm using Nyquil.   I need more prednisone.   I smoke.   No heart problems.    I feel bad.   I'm taking Tylenol.   Poor appetite.   No diarrhea.   No vomiting.   My whole body aches.   No headaches.   No runny nose.   No sore throat.     I'm ok except when I try to do something I feel winded. 2. ONSET: "When did this breathing problem begin?"      Saturday.    3. PATTERN "Does the difficult breathing come and go, or has it been constant since it started?"      Not better.   The prednisone did not help. 4. SEVERITY: "How bad is your breathing?" (e.g., mild, moderate, severe)    - MILD: No SOB at rest, mild SOB with walking, speaks normally in sentences, can lie down, no retractions, pulse < 100.    - MODERATE: SOB at rest, SOB with minimal exertion and prefers to sit, cannot lie down flat, speaks in phrases, mild retractions, audible wheezing, pulse 100-120.    - SEVERE: Very SOB at rest, speaks in single words, struggling to breathe, sitting hunched forward, retractions, pulse > 120      Mild  Only with exertion.   Prednisone has not helped.   5. RECURRENT SYMPTOM: "Have you had difficulty breathing before?" If Yes, ask: "When was the last time?" and "What happened that time?"      Covid positive 6. CARDIAC HISTORY: "Do you have any history of heart disease?" (e.g., heart  attack, angina, bypass surgery, angioplasty)      No 7. LUNG HISTORY: "Do you have any history of lung disease?"  (e.g., pulmonary embolus, asthma, emphysema)     I smoke 8. CAUSE: "What do you think is causing the breathing problem?"      I'm just weak from the covid.    9. OTHER SYMPTOMS: "Do you have any other symptoms? (e.g., dizziness, runny nose, cough, chest pain, fever)     Monday symptoms started. 10. O2 SATURATION MONITOR:  "Do you use an oxygen saturation monitor (pulse oximeter) at home?" If Yes, "What is your reading (oxygen level) today?" "What is your usual oxygen saturation reading?" (e.g., 95%)       No 11. PREGNANCY: "Is there any chance you are pregnant?" "When was your last menstrual period?"       N/A 12. TRAVEL: "Have you traveled out of the country in the last month?" (e.g., travel history, exposures)       No  Protocols used: Breathing Difficulty-A-AH

## 2021-03-25 NOTE — Progress Notes (Signed)
   There were no vitals taken for this visit.   Subjective:    Patient ID: Mark [name], male    DOB: [birth year], 60 y.o.   MRN: [id number]  HPI: Mark [name] is a 60 y.o. male  Chief Complaint  Patient presents with   Covid Positive    Fatigued, patient states that he just feels bad    COVID Patient seen earlier this week for COVID.  Patient already took prednisone that was prescribed for him earlier this week.  Patient denies any SOB or cough.  States he is very fatigued and wants to feel better.    Relevant past medical, surgical, family and social history reviewed and updated as indicated. Interim medical history since our last visit reviewed. Allergies and medications reviewed and updated.  Review of Systems  Constitutional:  Positive for fatigue.  Respiratory:  Negative for cough and shortness of breath.    Per HPI unless specifically indicated above     Objective:    There were no vitals taken for this visit.  Wt Readings from Last 3 Encounters:  03/22/21 145 lb (65.8 kg)  03/18/21 155 lb (70.3 kg)  01/04/21 154 lb 9.6 oz (70.1 kg)    Physical Exam Vitals and nursing note reviewed.  HENT:     Right Ear: Hearing normal.     Left Ear: Hearing normal.  Pulmonary:     Effort: Pulmonary effort is normal. No respiratory distress.  Neurological:     Mental Status: He is alert.  Psychiatric:        Mood and Affect: Mood normal.        Behavior: Behavior normal.        Thought Content: Thought content normal.        Judgment: Judgment normal.    Results for orders placed or performed in visit on 03/22/21  Novel Coronavirus, NAA ([facility])   Specimen: Nasopharyngeal(NP) swabs in vial transport medium  Result Value Ref Range   SARS-CoV-2, NAA Detected (A) Not Detected  SARS-COV-2, NAA 2 DAY TAT  Result Value Ref Range   SARS-CoV-2, NAA 2 DAY TAT Performed       Assessment & Plan:   Problem List Items Addressed This Visit   None Visit Diagnoses      COVID-19    -  Primary   Patient given a zpak. Outside of oral antiviral range. Completed prednisone already. Reviewed s/s and when patient should follow up.    Relevant Medications   azithromycin (ZITHROMAX) 250 MG tablet        Follow up plan: Return if symptoms worsen or fail to improve.   This visit was completed via MyChart due to the restrictions of the COVID-19 pandemic. All issues as above were discussed and addressed. Physical exam was done as above through visual confirmation on MyChart. If it was felt that the patient should be evaluated in the office, they were directed there. The patient verbally consented to this visit. Location of the patient: Home Location of the provider: Office Those involved with this call:  Provider: Larae Grooms, NP CMA: Tiffany Reel, CMA Front Desk/Registration: Sherlyn Lick This encounter was conducted via phone.  I spent 15 dedicated to the care of this patient on the date of this encounter to include previsit review of 21, face to face time with the patient, and post visit ordering of testing.

## 2021-03-25 NOTE — Telephone Encounter (Signed)
Routing to provider as an FYI for appointment today.

## 2021-03-25 NOTE — Telephone Encounter (Signed)
Pt called in.   He is Covid positive.   He is c/o still having body aches and feeling terrible even with the prednisone.   He is c/o shortness of breath with exertion not while sitting or sleeping.   He has 2 days left of the prednisone but he is c/o still feeling terrible.   He says is is coughing up green mucus.   See triage notes.   Most of his other symptoms have resolved except the body aches and coughing. He repeatedly kept asking for more medicine.   I feel terrible.   I encouraged him to take some Tylenol for the body aches.   He said he would get his brother to go get him some Tylenol because he did not have any.  I made him a phone call visit appt with Larae Grooms, NP for today at 11:20.   He does not use a computer.    I instructed him to be at his phone by 11:00 and to answer even if he did not recognize the number.   He was agreeable and verbalized understanding.

## 2021-03-25 NOTE — Telephone Encounter (Signed)
Reason for Disposition  [1] SEVERE weakness (i.e., unable to walk or barely able to walk, requires support) AND [2] new-onset or worsening  Answer Assessment - Initial Assessment Questions 1. DESCRIPTION: "Describe how you are feeling."     Weakness and "can't hardly get up" 2. SEVERITY: "How bad is it?"  "Can you stand and walk?"   - MILD - Feels weak or tired, but does not interfere with work, school or normal activities   - MODERATE - Able to stand and walk; weakness interferes with work, school, or normal activities   - SEVERE - Unable to stand or walk     Patient report weakness but can get something to drink. 3. ONSET:  "When did the weakness begin?"     Positive for covid. Virtual visit with PCP today  4. CAUSE: "What do you think is causing the weakness?"     Diarrhea and positive for covid 5. MEDICINES: "Have you recently started a new medicine or had a change in the amount of a medicine?"     azitrhomycin 6. OTHER SYMPTOMS: "Do you have any other symptoms?" (e.g., chest pain, fever, cough, SOB, vomiting, diarrhea, bleeding, other areas of pain)     Shortness of breath and diarrhea 7. PREGNANCY: "Is there any chance you are pregnant?" "When was your last menstrual period?"     na  Protocols used: Weakness (Generalized) and Fatigue-A-AH

## 2021-03-25 NOTE — Telephone Encounter (Signed)
Noted.  Patient told during visit it could take several weeks for patient to feel better.

## 2021-03-25 NOTE — Telephone Encounter (Signed)
C/o increasing weakness and reports "can't hardly get up" . Recent dx of positive for covid. C/o diarrhea x 2 today "like water". C/o shortness of breath. Reports he started taking azithromycin today and still does not feel better. Patient had virtual My Chart visit with PCP today. Patient difficulty to redirect to answer NT questions. Instructed patient to call 911 due to report he is too weak to get up. While on call encouraged patient to increase fluid intake and try Pedialyte. Patient able to get up and get Pedialyte. Patient reported he had another call from [facility] health and needed to answer phone call. Instructed patient if he continues with weakness and can not get up or shortness of breath worsens call 911. Patient hung up phone to answer other phone call. Please advise .

## 2021-03-27 ENCOUNTER — Other Ambulatory Visit: Payer: Self-pay

## 2021-03-27 ENCOUNTER — Emergency Department: Payer: Medicare HMO

## 2021-03-27 ENCOUNTER — Inpatient Hospital Stay
Admission: EM | Admit: 2021-03-27 | Discharge: 2021-04-04 | DRG: 193 | Disposition: A | Discharge status: Home Health | Payer: Medicare HMO | Attending: Internal Medicine | Admitting: Internal Medicine

## 2021-03-27 DIAGNOSIS — R112 Nausea with vomiting, unspecified: Secondary | ICD-10-CM

## 2021-03-27 DIAGNOSIS — Z79899 Other long term (current) drug therapy: Secondary | ICD-10-CM

## 2021-03-27 DIAGNOSIS — R0902 Hypoxemia: Secondary | ICD-10-CM | POA: Diagnosis not present

## 2021-03-27 DIAGNOSIS — R0602 Shortness of breath: Secondary | ICD-10-CM | POA: Diagnosis not present

## 2021-03-27 DIAGNOSIS — A0839 Other viral enteritis: Secondary | ICD-10-CM | POA: Diagnosis present

## 2021-03-27 DIAGNOSIS — I700 Atherosclerosis of aorta: Secondary | ICD-10-CM | POA: Diagnosis present

## 2021-03-27 DIAGNOSIS — R1032 Left lower quadrant pain: Secondary | ICD-10-CM

## 2021-03-27 DIAGNOSIS — E785 Hyperlipidemia, unspecified: Secondary | ICD-10-CM | POA: Diagnosis not present

## 2021-03-27 DIAGNOSIS — Z8249 Family history of ischemic heart disease and other diseases of the circulatory system: Secondary | ICD-10-CM | POA: Diagnosis not present

## 2021-03-27 DIAGNOSIS — J189 Pneumonia, unspecified organism: Secondary | ICD-10-CM

## 2021-03-27 DIAGNOSIS — F17210 Nicotine dependence, cigarettes, uncomplicated: Secondary | ICD-10-CM | POA: Diagnosis present

## 2021-03-27 DIAGNOSIS — U071 COVID-19: Secondary | ICD-10-CM | POA: Diagnosis not present

## 2021-03-27 DIAGNOSIS — I10 Essential (primary) hypertension: Secondary | ICD-10-CM | POA: Diagnosis present

## 2021-03-27 DIAGNOSIS — R338 Other retention of urine: Secondary | ICD-10-CM | POA: Diagnosis present

## 2021-03-27 DIAGNOSIS — R1110 Vomiting, unspecified: Secondary | ICD-10-CM | POA: Diagnosis not present

## 2021-03-27 DIAGNOSIS — J159 Unspecified bacterial pneumonia: Secondary | ICD-10-CM | POA: Diagnosis not present

## 2021-03-27 DIAGNOSIS — K7689 Other specified diseases of liver: Secondary | ICD-10-CM | POA: Diagnosis not present

## 2021-03-27 DIAGNOSIS — R059 Cough, unspecified: Secondary | ICD-10-CM | POA: Diagnosis not present

## 2021-03-27 DIAGNOSIS — R110 Nausea: Secondary | ICD-10-CM | POA: Diagnosis not present

## 2021-03-27 DIAGNOSIS — E860 Dehydration: Secondary | ICD-10-CM | POA: Diagnosis present

## 2021-03-27 DIAGNOSIS — R109 Unspecified abdominal pain: Secondary | ICD-10-CM

## 2021-03-27 DIAGNOSIS — E876 Hypokalemia: Secondary | ICD-10-CM | POA: Diagnosis present

## 2021-03-27 DIAGNOSIS — Z7982 Long term (current) use of aspirin: Secondary | ICD-10-CM

## 2021-03-27 DIAGNOSIS — I951 Orthostatic hypotension: Secondary | ICD-10-CM | POA: Diagnosis not present

## 2021-03-27 DIAGNOSIS — N132 Hydronephrosis with renal and ureteral calculous obstruction: Secondary | ICD-10-CM | POA: Diagnosis not present

## 2021-03-27 DIAGNOSIS — R531 Weakness: Secondary | ICD-10-CM | POA: Diagnosis not present

## 2021-03-27 DIAGNOSIS — N131 Hydronephrosis with ureteral stricture, not elsewhere classified: Secondary | ICD-10-CM | POA: Diagnosis present

## 2021-03-27 DIAGNOSIS — Z885 Allergy status to narcotic agent status: Secondary | ICD-10-CM

## 2021-03-27 DIAGNOSIS — R42 Dizziness and giddiness: Secondary | ICD-10-CM

## 2021-03-27 DIAGNOSIS — J1282 Pneumonia due to coronavirus disease 2019: Secondary | ICD-10-CM | POA: Diagnosis not present

## 2021-03-27 DIAGNOSIS — N401 Enlarged prostate with lower urinary tract symptoms: Secondary | ICD-10-CM | POA: Diagnosis present

## 2021-03-27 LAB — URINALYSIS, COMPLETE (UACMP) WITH MICROSCOPIC
Bacteria, UA: NONE SEEN
Bilirubin Urine: NEGATIVE
Glucose, UA: NEGATIVE mg/dL
Hgb urine dipstick: NEGATIVE
Ketones, ur: 5 mg/dL — AB
Leukocytes,Ua: NEGATIVE
Nitrite: NEGATIVE
Protein, ur: NEGATIVE mg/dL
Specific Gravity, Urine: 1.019 (ref 1.005–1.030)
pH: 6 (ref 5.0–8.0)

## 2021-03-27 LAB — COMPREHENSIVE METABOLIC PANEL
ALT: 19 U/L (ref 0–44)
AST: 14 U/L — ABNORMAL LOW (ref 15–41)
Albumin: 3.5 g/dL (ref 3.5–5.0)
Alkaline Phosphatase: 106 U/L (ref 38–126)
Anion gap: 11 (ref 5–15)
BUN: 21 mg/dL — ABNORMAL HIGH (ref 6–20)
CO2: 21 mmol/L — ABNORMAL LOW (ref 22–32)
Calcium: 8.9 mg/dL (ref 8.9–10.3)
Chloride: 105 mmol/L (ref 98–111)
Creatinine, Ser: 0.91 mg/dL (ref 0.61–1.24)
GFR, Estimated: >60 mL/min (ref >60)
Glucose, Bld: 114 mg/dL — ABNORMAL HIGH (ref 70–99)
Potassium: 3.1 mmol/L — ABNORMAL LOW (ref 3.5–5.1)
Sodium: 137 mmol/L (ref 135–145)
Total Bilirubin: 1 mg/dL (ref 0.3–1.2)
Total Protein: 7.2 g/dL (ref 6.5–8.1)

## 2021-03-27 LAB — LACTIC ACID, PLASMA: Lactic Acid, Venous: 0.9 mmol/L (ref 0.5–1.9)

## 2021-03-27 LAB — CBC
HCT: 43.8 % (ref 39.0–52.0)
Hemoglobin: 15.6 g/dL (ref 13.0–17.0)
MCH: 32.1 pg (ref 26.0–34.0)
MCHC: 35.6 g/dL (ref 30.0–36.0)
MCV: 90.1 fL (ref 80.0–100.0)
Platelets: 476 10*3/uL — ABNORMAL HIGH (ref 150–400)
RBC: 4.86 MIL/uL (ref 4.22–5.81)
RDW: 13.9 % (ref 11.5–15.5)
WBC: 12.9 10*3/uL — ABNORMAL HIGH (ref 4.0–10.5)
nRBC: 0 % (ref 0.0–0.2)

## 2021-03-27 LAB — CBC WITH DIFFERENTIAL/PLATELET
Abs Immature Granulocytes: 0.09 10*3/uL — ABNORMAL HIGH (ref 0.00–0.07)
Basophils Absolute: 0 10*3/uL (ref 0.0–0.1)
Basophils Relative: 0 %
Eosinophils Absolute: 0 10*3/uL (ref 0.0–0.5)
Eosinophils Relative: 0 %
HCT: 44.4 % (ref 39.0–52.0)
Hemoglobin: 15.6 g/dL (ref 13.0–17.0)
Immature Granulocytes: 1 %
Lymphocytes Relative: 15 %
Lymphs Abs: 2 10*3/uL (ref 0.7–4.0)
MCH: 31.8 pg (ref 26.0–34.0)
MCHC: 35.1 g/dL (ref 30.0–36.0)
MCV: 90.4 fL (ref 80.0–100.0)
Monocytes Absolute: 1.7 10*3/uL — ABNORMAL HIGH (ref 0.1–1.0)
Monocytes Relative: 14 %
Neutro Abs: 9 10*3/uL — ABNORMAL HIGH (ref 1.7–7.7)
Neutrophils Relative %: 70 %
Platelets: 509 10*3/uL — ABNORMAL HIGH (ref 150–400)
RBC: 4.91 MIL/uL (ref 4.22–5.81)
RDW: 14 % (ref 11.5–15.5)
WBC: 12.9 10*3/uL — ABNORMAL HIGH (ref 4.0–10.5)
nRBC: 0 % (ref 0.0–0.2)

## 2021-03-27 LAB — TROPONIN I (HIGH SENSITIVITY)
Troponin I (High Sensitivity): 10 ng/L (ref <18)
Troponin I (High Sensitivity): 10 ng/L (ref <18)

## 2021-03-27 MED ORDER — PREDNISONE 20 MG PO TABS
20.0000 mg | ORAL_TABLET | Freq: Every day | ORAL | Status: DC
  Administered 2021-03-28: 20 mg via ORAL
  Filled 2021-03-27: qty 1

## 2021-03-27 MED ORDER — LEVOFLOXACIN IN D5W 500 MG/100ML IV SOLN
500.0000 mg | Freq: Once | INTRAVENOUS | Status: AC
  Administered 2021-03-27: 500 mg via INTRAVENOUS
  Filled 2021-03-27: qty 100

## 2021-03-27 MED ORDER — VITAMIN B-12 1000 MCG PO TABS
1000.0000 ug | ORAL_TABLET | Freq: Every day | ORAL | Status: DC
  Administered 2021-03-27 – 2021-04-04 (×9): 1000 ug via ORAL
  Filled 2021-03-27 (×10): qty 1

## 2021-03-27 MED ORDER — MORPHINE SULFATE (PF) 2 MG/ML IV SOLN
1.0000 mg | INTRAVENOUS | Status: DC | PRN
  Administered 2021-03-27 – 2021-04-02 (×12): 1 mg via INTRAVENOUS
  Filled 2021-03-27 (×12): qty 1

## 2021-03-27 MED ORDER — SODIUM CHLORIDE 0.9 % IV BOLUS
1000.0000 mL | Freq: Once | INTRAVENOUS | Status: AC
  Administered 2021-03-27: 1000 mL via INTRAVENOUS

## 2021-03-27 MED ORDER — ONDANSETRON HCL 4 MG/5ML PO SOLN
4.0000 mg | Freq: Three times a day (TID) | ORAL | Status: DC | PRN
  Administered 2021-04-04: 08:00:00 4 mg via ORAL
  Filled 2021-03-27 (×3): qty 5

## 2021-03-27 MED ORDER — PRAVASTATIN SODIUM 20 MG PO TABS
20.0000 mg | ORAL_TABLET | Freq: Every day | ORAL | Status: DC
  Administered 2021-03-27 – 2021-04-04 (×9): 20 mg via ORAL
  Filled 2021-03-27 (×9): qty 1

## 2021-03-27 MED ORDER — CITALOPRAM HYDROBROMIDE 20 MG PO TABS
20.0000 mg | ORAL_TABLET | Freq: Every day | ORAL | Status: DC
  Administered 2021-03-27 – 2021-04-04 (×9): 20 mg via ORAL
  Filled 2021-03-27 (×9): qty 1

## 2021-03-27 MED ORDER — ONDANSETRON HCL 4 MG/2ML IJ SOLN
4.0000 mg | Freq: Three times a day (TID) | INTRAMUSCULAR | Status: DC | PRN
  Filled 2021-03-27: qty 2

## 2021-03-27 MED ORDER — FERROUS SULFATE 325 (65 FE) MG PO TABS
325.0000 mg | ORAL_TABLET | Freq: Every day | ORAL | Status: DC
  Administered 2021-03-28 – 2021-04-04 (×8): 325 mg via ORAL
  Filled 2021-03-27 (×9): qty 1

## 2021-03-27 MED ORDER — POTASSIUM CHLORIDE CRYS ER 20 MEQ PO TBCR
40.0000 meq | EXTENDED_RELEASE_TABLET | Freq: Once | ORAL | Status: AC
  Administered 2021-03-27: 40 meq via ORAL
  Filled 2021-03-27: qty 2

## 2021-03-27 MED ORDER — LEVOFLOXACIN IN D5W 500 MG/100ML IV SOLN: 500.0000 mg | INTRAVENOUS | Status: DC

## 2021-03-27 MED ORDER — ENOXAPARIN SODIUM 40 MG/0.4ML IJ SOSY
40.0000 mg | PREFILLED_SYRINGE | INTRAMUSCULAR | Status: DC
  Administered 2021-03-27 – 2021-04-03 (×8): 40 mg via SUBCUTANEOUS
  Filled 2021-03-27 (×9): qty 0.4

## 2021-03-27 MED ORDER — IOHEXOL 350 MG/ML SOLN
75.0000 mL | Freq: Once | INTRAVENOUS | Status: AC | PRN
  Administered 2021-03-27: 75 mL via INTRAVENOUS

## 2021-03-27 MED ORDER — METOPROLOL TARTRATE 25 MG PO TABS
25.0000 mg | ORAL_TABLET | Freq: Two times a day (BID) | ORAL | Status: DC
  Administered 2021-03-27 – 2021-03-31 (×9): 25 mg via ORAL
  Filled 2021-03-27 (×9): qty 1

## 2021-03-27 MED ORDER — SODIUM CHLORIDE 0.9 % IV SOLN
1.0000 g | INTRAVENOUS | Status: AC
  Administered 2021-03-27 – 2021-04-01 (×6): 1 g via INTRAVENOUS
  Filled 2021-03-27 (×2): qty 10
  Filled 2021-03-27 (×2): qty 1
  Filled 2021-03-27 (×2): qty 10
  Filled 2021-03-27: qty 1

## 2021-03-27 MED ORDER — MELATONIN 5 MG PO TABS
10.0000 mg | ORAL_TABLET | Freq: Every day | ORAL | Status: DC
  Administered 2021-03-27 – 2021-04-03 (×8): 10 mg via ORAL
  Filled 2021-03-27 (×9): qty 2

## 2021-03-27 MED ORDER — AZITHROMYCIN 500 MG PO TABS
500.0000 mg | ORAL_TABLET | Freq: Every day | ORAL | Status: DC
  Filled 2021-03-27: qty 1

## 2021-03-27 MED ORDER — IOHEXOL 9 MG/ML PO SOLN
500.0000 mL | Freq: Once | ORAL | Status: AC
  Administered 2021-03-27: 500 mL via ORAL

## 2021-03-27 MED ORDER — ONDANSETRON HCL 4 MG/2ML IJ SOLN
4.0000 mg | Freq: Once | INTRAMUSCULAR | Status: AC
  Administered 2021-03-27: 4 mg via INTRAVENOUS
  Filled 2021-03-27: qty 2

## 2021-03-27 MED ORDER — AZITHROMYCIN 500 MG PO TABS
500.0000 mg | ORAL_TABLET | Freq: Every day | ORAL | Status: AC
  Administered 2021-03-28 – 2021-04-01 (×5): 500 mg via ORAL
  Filled 2021-03-27 (×5): qty 1

## 2021-03-27 MED ORDER — TAMSULOSIN HCL 0.4 MG PO CAPS
0.4000 mg | ORAL_CAPSULE | Freq: Every day | ORAL | Status: DC
  Administered 2021-03-27 – 2021-04-04 (×9): 0.4 mg via ORAL
  Filled 2021-03-27 (×9): qty 1

## 2021-03-27 MED ORDER — AMLODIPINE BESYLATE 10 MG PO TABS
10.0000 mg | ORAL_TABLET | Freq: Every day | ORAL | Status: DC
  Administered 2021-03-27 – 2021-03-28 (×2): 10 mg via ORAL
  Filled 2021-03-27: qty 1
  Filled 2021-03-27: qty 2

## 2021-03-27 MED ORDER — ONDANSETRON HCL 4 MG/2ML IJ SOLN
4.0000 mg | Freq: Three times a day (TID) | INTRAMUSCULAR | Status: DC | PRN
  Administered 2021-03-27 – 2021-04-03 (×12): 4 mg via INTRAVENOUS
  Filled 2021-03-27 (×11): qty 2

## 2021-03-27 MED ORDER — ASPIRIN EC 81 MG PO TBEC
81.0000 mg | DELAYED_RELEASE_TABLET | Freq: Every day | ORAL | Status: DC
  Administered 2021-03-27 – 2021-04-04 (×9): 81 mg via ORAL
  Filled 2021-03-27 (×9): qty 1

## 2021-03-27 MED ORDER — SUCRALFATE 1 G PO TABS
1.0000 g | ORAL_TABLET | Freq: Three times a day (TID) | ORAL | Status: DC
  Administered 2021-03-27 – 2021-04-04 (×31): 1 g via ORAL
  Filled 2021-03-27 (×31): qty 1

## 2021-03-27 MED ORDER — MORPHINE SULFATE (PF) 4 MG/ML IV SOLN
4.0000 mg | Freq: Once | INTRAVENOUS | Status: AC
  Administered 2021-03-27: 4 mg via INTRAVENOUS
  Filled 2021-03-27: qty 1

## 2021-03-27 MED ORDER — PANTOPRAZOLE SODIUM 40 MG PO TBEC
40.0000 mg | DELAYED_RELEASE_TABLET | Freq: Every day | ORAL | Status: DC
  Administered 2021-03-27 – 2021-04-04 (×9): 40 mg via ORAL
  Filled 2021-03-27 (×9): qty 1

## 2021-03-27 NOTE — ED Triage Notes (Signed)
Pt arrives via EMS from home for dizziness and weakness d/t covid- pt tested positive on Monday- pt states he almost passed out this AM- pt has been on mucinex, azithromycin, and a steroid for the covid- pt BP dropped when he stood up per EMS, but HR stayed the same

## 2021-03-27 NOTE — ED Provider Notes (Signed)
[facility] Emergency Department Provider Note   ____________________________________________   Event Date/Time   First MD Initiated Contact with Patient 03/27/21 [phone]     (approximate)  I have reviewed the triage vital signs and the nursing notes.   HISTORY  Chief Complaint Dizziness   HPI OSMEL DYKSTRA is a 60 y.o. male who feels weak and dizzy.  He tested positive for COVID on Monday.  He has been taking Mucinex and Zithromax and steroid.  He gets very dizzy when he stands up.  EMS reports his blood pressure dropped from 130 to 98 laying to standing.  His heart rate did not change but he does take beta-blockers.  I should add does not know what medicines he is taking chronically I had to review his old records in some detail to discover this.  Additionally patient has some left lower quadrant pain which made him or may not be worse than usual.        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   History of kidney stones 05/09/2019   Pulmonary nodule 05/09/2019   Paraseptal emphysema (HCC) 05/09/2019   Nicotine dependence, cigarettes, w unsp disorders 04/30/2019   Hyperlipidemia LDL goal <70 09/05/2018   Elevated hemoglobin A1c 09/03/2018   Cognitive impairment 12/15/2016   History of opioid abuse (HCC) 12/30/2015   History of intestinal obstruction 05/03/2015   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   Anxiety and depression 05/03/2015   Hypertension 05/03/2015   Aphasia with stroke 05/03/2015   History of CVA (cerebrovascular accident) 05/03/2015   Patient reports she spent  a year in [facility] after surgery for stomach/bowel obstruction.   Past Surgical History:  Procedure Laterality Date   APPENDECTOMY     bowel obstruction     COLONOSCOPY N/A 09/07/2020   Procedure: COLONOSCOPY;  Surgeon: Pasty Spillers, MD;  Location: [facility] ENDOSCOPY;  Service: Endoscopy;  Laterality: N/A;   ESOPHAGOGASTRODUODENOSCOPY (EGD) WITH PROPOFOL N/A 09/03/2020   Procedure: ESOPHAGOGASTRODUODENOSCOPY (EGD) WITH PROPOFOL;  Surgeon: Toney Reil, MD;  Location: [facility] ENDOSCOPY;  Service: Gastroenterology;  Laterality: N/A;   EXTRACORPOREAL SHOCK WAVE LITHOTRIPSY Right 05/15/2019   Procedure: EXTRACORPOREAL SHOCK WAVE LITHOTRIPSY (ESWL);  Surgeon: Malen Gauze, MD;  Location: [facility] ORS;  Service: Urology;  Laterality: Right;    Prior to Admission medications   Medication Sig Start Date End Date Taking? Authorizing Provider  amLODipine (NORVASC) 10 MG tablet Take 1 tablet (10 mg total) by mouth daily. 12/07/20  Yes Cannady, Corrie Dandy T, NP  aspirin EC 81 MG tablet Take 1 tablet (81 mg total) by mouth daily. Swallow whole. 09/03/20 09/03/21 Yes Danford, Earl Lites, MD  azithromycin (ZITHROMAX) 250 MG tablet Take 2 tablets on day 1, then 1 tablet daily on days 2 through 5 03/25/21 03/30/21 Yes Larae Grooms, NP  citalopram (CELEXA) 20 MG tablet Take 1 tablet (20 mg total) by mouth daily. 12/07/20  Yes Cannady, Jolene T, NP  cyanocobalamin 1000 MCG tablet Take 1 tablet (1,000 mcg total) by mouth daily. 01/04/21  Yes Marjie Skiff, NP  ferrous  sulfate 325 (65 FE) MG EC tablet Take 1 tablet (325 mg total) by mouth daily with breakfast. 01/04/21 01/04/22 Yes Cannady, Jolene T, NP  Melatonin 10 MG TABS Take 10 mg by mouth at bedtime. 01/04/21  Yes Cannady, Jolene T, NP  metoprolol tartrate (LOPRESSOR) 25 MG tablet Take 1 tablet (25 mg total) by mouth 2 (two) times daily. 12/07/20  Yes Cannady, Jolene T, NP  omeprazole (PRILOSEC) 20 MG capsule TAKE 1 CAPSULE BY MOUTH EVERY DAY  12/07/20  Yes Cannady, Jolene T, NP  pravastatin (PRAVACHOL) 20 MG tablet Take 1 tablet (20 mg total) by mouth daily. 12/07/20  Yes Cannady, Corrie DandyJolene T, NP  predniSONE (DELTASONE) 10 MG tablet Take 6 tablets today, then 5 tablets tomorrow, then decrease by 1 tablet every day until gone 03/23/21  Yes McElwee, Lauren A, NP  sucralfate (CARAFATE) 1 g tablet TAKE 1 TABLET (1 G TOTAL) BY MOUTH 4 (FOUR) TIMES DAILY - WITH MEALS AND AT BEDTIME. 02/09/21  Yes Cannady, Jolene T, NP  tamsulosin (FLOMAX) 0.4 MG CAPS capsule Take 1 capsule (0.4 mg total) by mouth daily. 12/07/20  Yes Cannady, Corrie DandyJolene T, NP    Allergies Codeine  Family History  Problem Relation Age of Onset   Heart disease Father    Seizures Brother     Social History Social History   Tobacco Use   Smoking status: Every Day    Packs/day: 1.00    Years: 43.00    Pack years: 43.00    Types: Cigarettes   Smokeless tobacco: Never  Vaping Use   Vaping Use: Never used  Substance Use Topics   Alcohol use: Not Currently    Alcohol/week: 0.0 standard drinks   Drug use: Not Currently    Review of Systems Patient reports: Constitutional: No fever/chills Eyes: No visual changes. ENT: No sore throat. Cardiovascular: Denies chest pain. Respiratory: Denies shortness of breath. Gastrointestinal: No abdominal pain.  No nausea, no vomiting.  No diarrhea.  No constipation. Genitourinary: Negative for dysuria. Musculoskeletal: Negative for back pain. Skin: Negative for rash. Neurological: Negative for headaches, focal weakness  ____________________________________________   PHYSICAL EXAM:  VITAL SIGNS: ED Triage Vitals  Enc Vitals Group     BP 03/27/21 0833 101/83     Pulse Rate 03/27/21 0833 81     Resp 03/27/21 0833 16     Temp 03/27/21 0833 98.6 F (37 C)     Temp Source 03/27/21 0833 Oral     SpO2 03/27/21 0833 96 %     Weight 03/27/21 0834 140 lb (63.5 kg)     Height 03/27/21 0834 5\' 9"  (1.753 m)     Head Circumference --       Peak Flow --      Pain Score 03/27/21 0834 0     Pain Loc --      Pain Edu? --      Excl. in GC? --     Constitutional: Alert and oriented.  Looks tired and weak Eyes: Conjunctivae are normal.  Head: Atraumatic. Nose: No congestion/rhinnorhea. Mouth/Throat: Mucous membranes are moist.  Oropharynx non-erythematous. Neck: No stridor. Cardiovascular: Normal rate, regular rhythm. Grossly normal heart sounds.  Good peripheral circulation. Respiratory: Normal respiratory effort.  No retractions. Lungs scattered rhonchi Gastrointestinal: Soft tender to palpation left lower quadrant no distention. No abdominal bruits. No CVA tenderness.  Multiple surgical scars on belly Musculoskeletal: No lower extremity tenderness nor edema.   Neurologic:  Normal speech and language. No gross focal neurologic deficits  are appreciated.  Skin:  Skin is warm, dry and intact. No rash noted.   ____________________________________________   LABS (all labs ordered are listed, but only abnormal results are displayed)  Labs Reviewed  CBC - Abnormal; Notable for the following components:      Result Value   WBC 12.9 (*)    Platelets 476 (*)    All other components within normal limits  URINALYSIS, COMPLETE (UACMP) WITH MICROSCOPIC - Abnormal; Notable for the following components:   Color, Urine YELLOW (*)    APPearance CLEAR (*)    Ketones, ur 5 (*)    All other components within normal limits  COMPREHENSIVE METABOLIC PANEL - Abnormal; Notable for the following components:   Potassium 3.1 (*)    CO2 21 (*)    Glucose, Bld 114 (*)    BUN 21 (*)    AST 14 (*)    All other components within normal limits  CBC WITH DIFFERENTIAL/PLATELET - Abnormal; Notable for the following components:   WBC 12.9 (*)    Platelets 509 (*)    Neutro Abs 9.0 (*)    Monocytes Absolute 1.7 (*)    Abs Immature Granulocytes 0.09 (*)    All other components within normal limits  LACTIC ACID, PLASMA  TROPONIN I (HIGH  SENSITIVITY)  TROPONIN I (HIGH SENSITIVITY)   ____________________________________________  EKG  EKG read interpreted by me shows normal sinus rhythm rate of 75 normal axis nonspecific ST-T wave changes ____________________________________________  RADIOLOGY Jill Poling, personally viewed and evaluated these images (plain radiographs) as part of my medical decision making, as well as reviewing the written report by the radiologist.  ED MD interpretation: Chest x-ray read by radiology reviewed by me shows what appears to be bilateral lower lobe pneumonia. CT read by radiology reviewed by me does not show any cause for the left lower quadrant pain the patient is having however there is a right lower lobe infiltrate on this study as well.   Official radiology report(s): DG Chest 2 View  Result Date: 03/27/2021 CLINICAL DATA:  Cough. EXAM: CHEST - 2 VIEW COMPARISON:  September 12, 2011 FINDINGS: No pneumothorax. The cardiomediastinal silhouette is stable. Bibasilar pulmonary infiltrates, right greater than left identified. No other acute abnormalities. IMPRESSION: Bibasilar infiltrates consistent with developing pneumonia or aspiration. Recommend follow-up to resolution. No other changes. Electronically Signed   By: Gerome Sam III M.D.   On: 03/27/2021 10:00   CT ABDOMEN PELVIS W CONTRAST  Result Date: 03/27/2021 CLINICAL DATA:  Dizziness and weakness.  Positive COVID. EXAM: CT ABDOMEN AND PELVIS WITH CONTRAST TECHNIQUE: Multidetector CT imaging of the abdomen and pelvis was performed using the standard protocol following bolus administration of intravenous contrast. CONTRAST:  26mL OMNIPAQUE IOHEXOL 350 MG/ML SOLN COMPARISON:  None. FINDINGS: Lower chest: Branching nodular pattern in the RIGHT lower lobe (so-called tree in bud) consistent with RIGHT lower lobe pulmonary infection. No focal consolidation. Hepatobiliary: Benign vascular enhancement along the anterior RIGHT hepatic lobe  (image 10/2). No worrisome hepatic lesion. Postcholecystectomy Pancreas: Pancreas is normal. No ductal dilatation. No pancreatic inflammation. Spleen: Spleen is irregular lobular contour. Adrenals/urinary tract: Adrenal glands normal. There is mild persistent hydronephrosis and proximal hydroureter on the RIGHT which is improved from prior. Interval clearing of the mid RIGHT ureteral calculus seen on comparison CT exam. No ureteral calculi remain. No bladder calculi. LEFT kidney and ureter normal. Stomach/Bowel: Stomach and small bowel normal. Colon and rectosigmoid colon normal. Vascular/Lymphatic: Abdominal aorta is normal  caliber with atherosclerotic calcification. There is no retroperitoneal or periportal lymphadenopathy. No pelvic lymphadenopathy. Reproductive: Prostate unremarkable. Other: No free fluid. Musculoskeletal: No aggressive osseous lesion. IMPRESSION: 1. Tree-in-bud pattern in the RIGHT lower lobe consistent with pulmonary bronchopneumonia. 2. Mild residual hydronephrosis on the RIGHT related to prior RIGHT obstructing mid ureteral calculus. No obstructing calculus now present within the RIGHT ureter. 3. No clear explanation for LEFT lower quadrant pain as reported. Electronically Signed   By: Genevive Bi M.D.   On: 03/27/2021 11:46    ____________________________________________   PROCEDURES  Procedure(s) performed (including Critical Care): Critical care time half an hour.  This includes reviewing the labs and x-ray studies and speaking with the patient at least twice and reviewing his old records.  Procedures   ____________________________________________   INITIAL IMPRESSION / ASSESSMENT AND PLAN / ED COURSE  Patient remains orthostatic after fluids.  I believe he is not having his heart rate go up due to his beta-blockers.  He has an elevated white count and a pneumonia on top of the recent diagnosis of COVID.  I believe his orthostatic changes could be a manifestation of  sepsis.  We will get him in the hospital and treat him with with antibiotics.             ____________________________________________   FINAL CLINICAL IMPRESSION(S) / ED DIAGNOSES  Final diagnoses:  Dizziness  Community acquired pneumonia of right lower lobe of lung  Left lower quadrant abdominal pain     ED Discharge Orders     None        Note:  This document was prepared using Dragon voice recognition software and may include unintentional dictation errors.    Arnaldo Natal, MD 03/27/21 1550

## 2021-03-27 NOTE — H&P (Addendum)
History and Physical  Mark [name] IEP:[id number] DOB: 05-15-1961 DOA: 03/27/2021  Referring physician: Dr. Marge [name] PCP: Mark Skiff, NP  Outpatient Specialists: None Patient coming from: Home & is able to ambulate yes  Chief Complaint: Dizziness  HPI: Mark [name] is a 60 y.o. male with medical history significant for depression history of kidney stones hypertension history of stroke who presented to the emergency room with complaint of dizziness and feeling generally weak.  He was noted to have a orthostatic hypotension with a 20 point drop in his blood pressure.  His blood pressure dropped from 130-98 lying down to standing and he is quite symptomatic with feeling dizzy.  Patient was noted to have tested positive for COVID-19 on Monday 5 days ago he is not vaccinated.  Patient's complaining of cough productive of yellow sputum  ED Course: Patient was noted to have orthostatic blood pressure with symptoms and a 20 point drop in blood pressure he was given IV fluid but he remained orthostatic.  He is noted to have a white count of 12.9 and elevated platelet of 476.  His potassium is low 3.1.  There was no lactic acidosis he was saturating at 90% 95% on room air.  His x-ray noted pneumonia Brunker pneumonia as well as hydronephrosis he has a history of renal stone  Review of Systems:  Pt complains of feeling dizzy and weak  Pt denies any chest pain or shortness of breath no abdominal pain nausea vomiting and no diarrhea.  Denies any headache or focal weakness. .  Review of systems are otherwise negative   Past Medical History:  Diagnosis Date   Depression    History of kidney stones    Hypertension    Stroke [facility])    Past Surgical History:  Procedure Laterality Date   APPENDECTOMY     bowel obstruction     COLONOSCOPY N/A 09/07/2020   Procedure: COLONOSCOPY;  Surgeon: Pasty Spillers, MD;  Location: [facility] ENDOSCOPY;  Service: Endoscopy;  Laterality: N/A;    ESOPHAGOGASTRODUODENOSCOPY (EGD) WITH PROPOFOL N/A 09/03/2020   Procedure: ESOPHAGOGASTRODUODENOSCOPY (EGD) WITH PROPOFOL;  Surgeon: Toney Reil, MD;  Location: [facility] ENDOSCOPY;  Service: Gastroenterology;  Laterality: N/A;   EXTRACORPOREAL SHOCK WAVE LITHOTRIPSY Right 05/15/2019   Procedure: EXTRACORPOREAL SHOCK WAVE LITHOTRIPSY (ESWL);  Surgeon: Malen Gauze, MD;  Location: [facility] ORS;  Service: Urology;  Laterality: Right;    Social History:  reports that he has been smoking cigarettes. He has a 43.00 pack-year smoking history. He has never used smokeless tobacco. He reports that he does not currently use alcohol. He reports that he does not currently use drugs.   Allergies  Allergen Reactions   Codeine Hives    Family History  Problem Relation Age of Onset   Heart disease Father    Seizures Brother        Prior to Admission medications   Medication Sig Start Date End Date Taking? Authorizing Provider  amLODipine (NORVASC) 10 MG tablet Take 1 tablet (10 mg total) by mouth daily. 12/07/20  Yes Cannady, Corrie Dandy T, NP  aspirin EC 81 MG tablet Take 1 tablet (81 mg total) by mouth daily. Swallow whole. 09/03/20 09/03/21 Yes Danford, Earl Lites, MD  azithromycin (ZITHROMAX) 250 MG tablet Take 2 tablets on day 1, then 1 tablet daily on days 2 through 5 03/25/21 03/30/21 Yes Larae Grooms, NP  citalopram (CELEXA) 20 MG tablet Take 1 tablet (20 mg total) by mouth daily. 12/07/20  Yes Cannady,  Jolene T, NP  cyanocobalamin 1000 MCG tablet Take 1 tablet (1,000 mcg total) by mouth daily. 01/04/21  Yes Cannady, Corrie DandyJolene T, NP  ferrous sulfate 325 (65 FE) MG EC tablet Take 1 tablet (325 mg total) by mouth daily with breakfast. 01/04/21 01/04/22 Yes Cannady, Jolene T, NP  Melatonin 10 MG TABS Take 10 mg by mouth at bedtime. 01/04/21  Yes Cannady, Jolene T, NP  metoprolol tartrate (LOPRESSOR) 25 MG tablet Take 1 tablet (25 mg total) by mouth 2 (two) times daily. 12/07/20  Yes Cannady, Jolene T, NP   omeprazole (PRILOSEC) 20 MG capsule TAKE 1 CAPSULE BY MOUTH EVERY DAY 12/07/20  Yes Cannady, Jolene T, NP  pravastatin (PRAVACHOL) 20 MG tablet Take 1 tablet (20 mg total) by mouth daily. 12/07/20  Yes Cannady, Corrie DandyJolene T, NP  predniSONE (DELTASONE) 10 MG tablet Take 6 tablets today, then 5 tablets tomorrow, then decrease by 1 tablet every day until gone 03/23/21  Yes McElwee, Lauren A, NP  sucralfate (CARAFATE) 1 g tablet TAKE 1 TABLET (1 G TOTAL) BY MOUTH 4 (FOUR) TIMES DAILY - WITH MEALS AND AT BEDTIME. 02/09/21  Yes Cannady, Jolene T, NP  tamsulosin (FLOMAX) 0.4 MG CAPS capsule Take 1 capsule (0.4 mg total) by mouth daily. 12/07/20  Yes Aura Dialsannady, Jolene T, NP    Physical Exam: BP (!) 134/92 (BP Location: Left Arm)   Pulse 78   Temp 98.6 F (37 C) (Oral)   Resp 18   Ht 5\' 9"  (1.753 m)   Wt 63.5 kg   SpO2 98%   BMI 20.67 kg/m   Exam:  General: 60 y.o. year-old male well developed well nourished in no acute distress.  Alert and oriented x3. Cardiovascular: Regular rate and rhythm with no rubs or gallops.  No thyromegaly or JVD noted.   Respiratory: Bronchial breath sounds with no wheezes or rales. Good inspiratory effort. Abdomen: Soft nontender nondistended with normal bowel sounds x4 quadrants. Musculoskeletal: No lower extremity edema. 2/4 pulses in all 4 extremities. Skin: No ulcerative lesions noted or rashes, Psychiatry: Mood is appropriate for condition and setting           Labs on Admission:  Basic Metabolic Panel: Recent Labs  Lab 03/27/21 0837  NA 137  K 3.1*  CL 105  CO2 21*  GLUCOSE 114*  BUN 21*  CREATININE 0.91  CALCIUM 8.9   Liver Function Tests: Recent Labs  Lab 03/27/21 0837  AST 14*  ALT 19  ALKPHOS 106  BILITOT 1.0  PROT 7.2  ALBUMIN 3.5   No results for input(s): LIPASE, AMYLASE in the last 168 hours. No results for input(s): AMMONIA in the last 168 hours. CBC: Recent Labs  Lab 03/27/21 0837  WBC 12.9*  12.9*  NEUTROABS 9.0*  HGB 15.6   15.6  HCT 44.4  43.8  MCV 90.4  90.1  PLT 509*  476*   Cardiac Enzymes: No results for input(s): CKTOTAL, CKMB, CKMBINDEX, TROPONINI in the last 168 hours.  BNP (last 3 results) No results for input(s): BNP in the last 8760 hours.  ProBNP (last 3 results) No results for input(s): PROBNP in the last 8760 hours.  CBG: No results for input(s): GLUCAP in the last 168 hours.  Radiological Exams on Admission: DG Chest 2 View  Result Date: 03/27/2021 CLINICAL DATA:  Cough. EXAM: CHEST - 2 VIEW COMPARISON:  September 12, 2011 FINDINGS: No pneumothorax. The cardiomediastinal silhouette is stable. Bibasilar pulmonary infiltrates, right greater than left identified. No other acute  abnormalities. IMPRESSION: Bibasilar infiltrates consistent with developing pneumonia or aspiration. Recommend follow-up to resolution. No other changes. Electronically Signed   By: Gerome Sam III M.D.   On: 03/27/2021 10:00   CT ABDOMEN PELVIS W CONTRAST  Result Date: 03/27/2021 CLINICAL DATA:  Dizziness and weakness.  Positive COVID. EXAM: CT ABDOMEN AND PELVIS WITH CONTRAST TECHNIQUE: Multidetector CT imaging of the abdomen and pelvis was performed using the standard protocol following bolus administration of intravenous contrast. CONTRAST:  37mL OMNIPAQUE IOHEXOL 350 MG/ML SOLN COMPARISON:  None. FINDINGS: Lower chest: Branching nodular pattern in the RIGHT lower lobe (so-called tree in bud) consistent with RIGHT lower lobe pulmonary infection. No focal consolidation. Hepatobiliary: Benign vascular enhancement along the anterior RIGHT hepatic lobe (image 10/2). No worrisome hepatic lesion. Postcholecystectomy Pancreas: Pancreas is normal. No ductal dilatation. No pancreatic inflammation. Spleen: Spleen is irregular lobular contour. Adrenals/urinary tract: Adrenal glands normal. There is mild persistent hydronephrosis and proximal hydroureter on the RIGHT which is improved from prior. Interval clearing of the mid  RIGHT ureteral calculus seen on comparison CT exam. No ureteral calculi remain. No bladder calculi. LEFT kidney and ureter normal. Stomach/Bowel: Stomach and small bowel normal. Colon and rectosigmoid colon normal. Vascular/Lymphatic: Abdominal aorta is normal caliber with atherosclerotic calcification. There is no retroperitoneal or periportal lymphadenopathy. No pelvic lymphadenopathy. Reproductive: Prostate unremarkable. Other: No free fluid. Musculoskeletal: No aggressive osseous lesion. IMPRESSION: 1. Tree-in-bud pattern in the RIGHT lower lobe consistent with pulmonary bronchopneumonia. 2. Mild residual hydronephrosis on the RIGHT related to prior RIGHT obstructing mid ureteral calculus. No obstructing calculus now present within the RIGHT ureter. 3. No clear explanation for LEFT lower quadrant pain as reported. Electronically Signed   By: Genevive Bi M.D.   On: 03/27/2021 11:46    EKG: Independently reviewed.  Sinus rhythm rate was around 75 normal axis ,nonspecific ST-T wave changes  Assessment/Plan Present on Admission: **None**  Active Problems:   Dizziness and giddiness #1 orthostatic hypotension with a 20 point drop in blood pressure patient asymptomatic. Will continue IV fluid  2.  Bronchopneumonia.  Patient was started on Levaquin in the ED but it has been changed to ceftriaxone and azithromycin  3.  Hypertension continue amlodipine patient is however having orthostatic hypotension he is also on beta-blocker which is causing his heart rate to remain stable  4.  COVID-19 positive.  He is has a pneumonia but it does not look like is more of a bronchopneumonia and a groundglass appearing pneumonia.  This might be atypical but is noticeable look like a COVID respiratory pneumonia  5.  Hypokalemia we will replete and monitor  6.  History of renal calculus with hydronephrosis    Severity of Illness: The appropriate patient status for this patient is OBSERVATION. Observation  status is judged to be reasonable and necessary in order to provide the required intensity of service to ensure the patient's safety. The patient's presenting symptoms, physical exam findings, and initial radiographic and laboratory data in the context of their medical condition is felt to place them at decreased risk for further clinical deterioration. Furthermore, it is anticipated that the patient will be medically stable for discharge from the hospital within 2 midnights of admission. The following factors support the patient status of observation.   " Patient is having orthostatic hypotension despite IV fluid he has elevated white count and pneumonia.  He requires IV fluid and IV antibiotics  DVT prophylaxis: Lovenox  Code Status: Full  Family Communication: Patient  Disposition Plan: Home  when stable  Consults called: None  Admission status: Observation    Myrtie Neither MD [facility] Pager [phone]  If 7PM-7AM, please contact night-coverage [email or web address] Password [facility]  03/27/2021, 2:45 PM

## 2021-03-27 NOTE — ED Notes (Signed)
Ct at bedside to get pt

## 2021-03-27 NOTE — ED Notes (Signed)
Attempted IV access by this RN and Ambulance person x3 with no success

## 2021-03-28 ENCOUNTER — Inpatient Hospital Stay: Payer: Medicare HMO

## 2021-03-28 DIAGNOSIS — E860 Dehydration: Secondary | ICD-10-CM | POA: Diagnosis present

## 2021-03-28 DIAGNOSIS — J189 Pneumonia, unspecified organism: Secondary | ICD-10-CM | POA: Diagnosis not present

## 2021-03-28 DIAGNOSIS — N131 Hydronephrosis with ureteral stricture, not elsewhere classified: Secondary | ICD-10-CM | POA: Diagnosis present

## 2021-03-28 DIAGNOSIS — E876 Hypokalemia: Secondary | ICD-10-CM | POA: Diagnosis present

## 2021-03-28 DIAGNOSIS — R42 Dizziness and giddiness: Secondary | ICD-10-CM | POA: Diagnosis present

## 2021-03-28 DIAGNOSIS — J159 Unspecified bacterial pneumonia: Secondary | ICD-10-CM | POA: Diagnosis present

## 2021-03-28 DIAGNOSIS — Z7982 Long term (current) use of aspirin: Secondary | ICD-10-CM | POA: Diagnosis not present

## 2021-03-28 DIAGNOSIS — I10 Essential (primary) hypertension: Secondary | ICD-10-CM | POA: Diagnosis present

## 2021-03-28 DIAGNOSIS — E785 Hyperlipidemia, unspecified: Secondary | ICD-10-CM | POA: Diagnosis present

## 2021-03-28 DIAGNOSIS — R338 Other retention of urine: Secondary | ICD-10-CM | POA: Diagnosis present

## 2021-03-28 DIAGNOSIS — I951 Orthostatic hypotension: Secondary | ICD-10-CM | POA: Diagnosis present

## 2021-03-28 DIAGNOSIS — U071 COVID-19: Secondary | ICD-10-CM | POA: Diagnosis present

## 2021-03-28 DIAGNOSIS — F17210 Nicotine dependence, cigarettes, uncomplicated: Secondary | ICD-10-CM | POA: Diagnosis present

## 2021-03-28 DIAGNOSIS — I700 Atherosclerosis of aorta: Secondary | ICD-10-CM | POA: Diagnosis present

## 2021-03-28 DIAGNOSIS — Z885 Allergy status to narcotic agent status: Secondary | ICD-10-CM | POA: Diagnosis not present

## 2021-03-28 DIAGNOSIS — A0839 Other viral enteritis: Secondary | ICD-10-CM | POA: Diagnosis present

## 2021-03-28 DIAGNOSIS — Z79899 Other long term (current) drug therapy: Secondary | ICD-10-CM | POA: Diagnosis not present

## 2021-03-28 DIAGNOSIS — N401 Enlarged prostate with lower urinary tract symptoms: Secondary | ICD-10-CM | POA: Diagnosis present

## 2021-03-28 DIAGNOSIS — Z8249 Family history of ischemic heart disease and other diseases of the circulatory system: Secondary | ICD-10-CM | POA: Diagnosis not present

## 2021-03-28 LAB — BASIC METABOLIC PANEL
Anion gap: 12 (ref 5–15)
BUN: 13 mg/dL (ref 6–20)
CO2: 19 mmol/L — ABNORMAL LOW (ref 22–32)
Calcium: 8.3 mg/dL — ABNORMAL LOW (ref 8.9–10.3)
Chloride: 105 mmol/L (ref 98–111)
Creatinine, Ser: 0.9 mg/dL (ref 0.61–1.24)
GFR, Estimated: >60 mL/min (ref >60)
Glucose, Bld: 97 mg/dL (ref 70–99)
Potassium: 2.9 mmol/L — ABNORMAL LOW (ref 3.5–5.1)
Sodium: 136 mmol/L (ref 135–145)

## 2021-03-28 LAB — CBC
HCT: 38.2 % — ABNORMAL LOW (ref 39.0–52.0)
Hemoglobin: 13.7 g/dL (ref 13.0–17.0)
MCH: 32.8 pg (ref 26.0–34.0)
MCHC: 35.9 g/dL (ref 30.0–36.0)
MCV: 91.4 fL (ref 80.0–100.0)
Platelets: 503 10*3/uL — ABNORMAL HIGH (ref 150–400)
RBC: 4.18 MIL/uL — ABNORMAL LOW (ref 4.22–5.81)
RDW: 14 % (ref 11.5–15.5)
WBC: 12.2 10*3/uL — ABNORMAL HIGH (ref 4.0–10.5)
nRBC: 0 % (ref 0.0–0.2)

## 2021-03-28 LAB — C DIFFICILE QUICK SCREEN W PCR REFLEX
C Diff antigen: NEGATIVE
C Diff interpretation: NOT DETECTED
C Diff toxin: NEGATIVE

## 2021-03-28 LAB — PROCALCITONIN: Procalcitonin: 6.62 ng/mL

## 2021-03-28 LAB — MAGNESIUM: Magnesium: 2 mg/dL (ref 1.7–2.4)

## 2021-03-28 LAB — BRAIN NATRIURETIC PEPTIDE: B Natriuretic Peptide: 81.6 pg/mL (ref 0.0–100.0)

## 2021-03-28 MED ORDER — POTASSIUM CHLORIDE 2 MEQ/ML IV SOLN
INTRAVENOUS | Status: DC
  Filled 2021-03-28 (×3): qty 1000

## 2021-03-28 MED ORDER — TRAMADOL HCL 50 MG PO TABS
50.0000 mg | ORAL_TABLET | Freq: Four times a day (QID) | ORAL | Status: DC | PRN
Start: 2021-03-28 — End: 2021-04-04
  Administered 2021-03-28 – 2021-04-04 (×16): 50 mg via ORAL
  Filled 2021-03-28 (×16): qty 1

## 2021-03-28 MED ORDER — LACTATED RINGERS IV SOLN: INTRAVENOUS | Status: DC

## 2021-03-28 MED ORDER — SODIUM CHLORIDE 0.9 % IV SOLN: 25.0000 mg | Freq: Four times a day (QID) | INTRAVENOUS | Status: DC | PRN

## 2021-03-28 MED ORDER — POTASSIUM CHLORIDE 10 MEQ/100ML IV SOLN
10.0000 meq | INTRAVENOUS | Status: DC
Start: 2021-03-28 — End: 2021-03-28

## 2021-03-28 MED ORDER — LACTATED RINGERS IV BOLUS
1000.0000 mL | Freq: Once | INTRAVENOUS | Status: AC
  Administered 2021-03-28: 09:00:00 1000 mL via INTRAVENOUS

## 2021-03-28 MED ORDER — ALBUTEROL SULFATE HFA 108 (90 BASE) MCG/ACT IN AERS
2.0000 | INHALATION_SPRAY | Freq: Four times a day (QID) | RESPIRATORY_TRACT | Status: DC | PRN
  Administered 2021-03-28 – 2021-04-01 (×2): 2 via RESPIRATORY_TRACT
  Filled 2021-03-28: qty 6.7

## 2021-03-28 MED ORDER — DIPHENHYDRAMINE HCL 50 MG/ML IJ SOLN
25.0000 mg | Freq: Four times a day (QID) | INTRAMUSCULAR | Status: AC | PRN
  Administered 2021-03-28 – 2021-03-29 (×2): 25 mg via INTRAVENOUS
  Filled 2021-03-28 (×2): qty 1

## 2021-03-28 MED ORDER — SODIUM CHLORIDE 0.9 % IV SOLN
25.0000 mg | Freq: Three times a day (TID) | INTRAVENOUS | Status: DC | PRN
  Administered 2021-03-28 – 2021-03-31 (×3): 25 mg via INTRAVENOUS
  Filled 2021-03-28: qty 1
  Filled 2021-03-28: qty 25
  Filled 2021-03-28 (×4): qty 1

## 2021-03-28 MED ORDER — POTASSIUM CHLORIDE CRYS ER 20 MEQ PO TBCR
40.0000 meq | EXTENDED_RELEASE_TABLET | Freq: Once | ORAL | Status: AC
  Administered 2021-03-28: 09:00:00 40 meq via ORAL
  Filled 2021-03-28: qty 4

## 2021-03-28 MED ORDER — METHYLPREDNISOLONE SODIUM SUCC 40 MG IJ SOLR
40.0000 mg | Freq: Two times a day (BID) | INTRAMUSCULAR | Status: DC
  Administered 2021-03-28 – 2021-03-30 (×5): 40 mg via INTRAVENOUS
  Filled 2021-03-28 (×5): qty 1

## 2021-03-28 MED ORDER — ACETAMINOPHEN 325 MG PO TABS
650.0000 mg | ORAL_TABLET | Freq: Four times a day (QID) | ORAL | Status: DC | PRN
  Administered 2021-03-28 – 2021-04-04 (×7): 650 mg via ORAL
  Filled 2021-03-28 (×7): qty 2

## 2021-03-28 NOTE — Progress Notes (Signed)
PROGRESS NOTE                                                                                                                                                                                                             Patient Demographics:    Mark [name], is a 60 y.o. male, DOB - Aug 02, 1961, ZOX:[id number]  Outpatient Primary MD for the patient is Marjie Skiffannady, Jolene T, NP    LOS - 0  Admit date - 03/27/2021    Chief Complaint  Patient presents with   Dizziness       Brief Narrative (HPI from H&P)    - Cottie BandaRodney D [name] is a 60 y.o. male with medical history significant for depression history of kidney stones hypertension history of stroke who presented to the emergency room with complaint of dizziness and feeling generally weak, had preceding history of diarrhea and some generalized abdominal pain for the last few days along with a mild cough which was productive of yellow phlegm.  In the ER he was diagnosed with COVID-19 infection, bacterial pneumonia, COVID-19 gastroenteritis with severe dehydration and hypotension and admitted to the hospital.   Subjective:    Mark [name] has, No headache, No chest pain, No abdominal pain - No Nausea, No new weakness tingling or numbness, no SOB, ++ diarrhea, feels light headed.   Assessment  & Plan :    Active Problems:   Dizziness and giddiness   Acute with 19 infection with bacterial pneumonia and possible COVID-19 gastroenteritis - he evidence of bacterial pneumonia for which he is on IV antibiotics, will place him on IV steroids for any COVID-related inflammation, IV fluids with bolus and maintenance as he is extremely dehydrated and orthostatic.  Monitor closely his inflammatory markers and him clinically.  Encouraged the patient to sit up in chair in the daytime use I-S and flutter valve for pulmonary toiletry.  Will advance activity and titrate down oxygen as  possible.   SpO2: 94 %  Recent Labs  Lab 03/22/21 0506 03/27/21 0837 03/27/21 1218 03/28/21 0837  WBC  --  12.9*  12.9*  --  12.2*  HGB  --  15.6  15.6  --  13.7  HCT  --  44.4  43.8  --  38.2*  PLT  --  509*  476*  --  503*  AST  --  14*  --   --   ALT  --  19  --   --   ALKPHOS  --  106  --   --   BILITOT  --  1.0  --   --   ALBUMIN  --  3.5  --   --   LATICACIDVEN  --   --  0.9  --   SARSCOV2NAA Detected*  --   --   --     2.  Severe dehydration with orthostatic hypotension.  Hydrate.  3.  Bowel resection.  Abdominal CT nonacute, chronic ureteric obstruction and hydronephrosis noted, no concerning symptoms will monitor closely.  Abdominal exam is benign for now.  4.  Gastroenteritis.  Most likely COVID, rule out C. difficile.  Gastroenteritis was present on admission.   5.  Essential hypertension.  Blood pressure low hold blood pressure medications and hydrate.  6.  Hypokalemia.  Replaced PO + IV.       Condition - Extremely Guarded  Family Communication  :  none present  Code Status :  Full  Consults  :  None  PUD Prophylaxis : PPI   Procedures  :     CT  - 1. Tree-in-bud pattern in the RIGHT lower lobe consistent with pulmonary bronchopneumonia. 2. Mild residual hydronephrosis on the RIGHT related to prior RIGHT obstructing mid ureteral calculus. No obstructing calculus now present within the RIGHT ureter. 3. No clear explanation for LEFT lower quadrant pain as reported.      Disposition Plan  :    Status is: Observation   Dispo: The patient is from: Home              Anticipated d/c is to: Home              Patient currently is not medically stable to d/c.   Difficult to place patient No  DVT Prophylaxis  :    enoxaparin (LOVENOX) injection 40 mg Start: 03/27/21 1515 Place TED hose Start: 03/27/21 1503    Lab Results  Component Value Date   PLT 503 (H) 03/28/2021    Diet :  Diet Order             Diet Heart Room service  appropriate? Yes; Fluid consistency: Thin  Diet effective now                    Inpatient Medications  Scheduled Meds:  aspirin EC  81 mg Oral Daily   azithromycin  500 mg Oral Daily   citalopram  20 mg Oral Daily   enoxaparin (LOVENOX) injection  40 mg Subcutaneous Q24H   ferrous sulfate  325 mg Oral Q breakfast   melatonin  10 mg Oral QHS   metoprolol tartrate  25 mg Oral BID   pantoprazole  40 mg Oral Daily   pravastatin  20 mg Oral Daily   predniSONE  20 mg Oral Q breakfast   sucralfate  1 g Oral TID WC & HS   tamsulosin  0.4 mg Oral Daily   cyanocobalamin  1,000 mcg Oral Daily   Continuous Infusions:  cefTRIAXone (ROCEPHIN)  IV 1 g (03/27/21 1728)   lactated ringers     lactated ringers     PRN Meds:.albuterol, morphine injection, ondansetron, ondansetron (ZOFRAN) IV  Antibiotics Given (last 72 hours)     Date/Time Action Medication Dose Rate   03/27/21 1248 New Bag/Given   levofloxacin (LEVAQUIN) IVPB 500 mg 500 mg 100 mL/hr  03/27/21 1728 New Bag/Given   cefTRIAXone (ROCEPHIN) 1 g in sodium chloride 0.9 % 100 mL IVPB 1 g 200 mL/hr   03/28/21 0818 Given   azithromycin (ZITHROMAX) tablet 500 mg 500 mg          Time Spent in minutes  30   Susa Raring M.D on 03/28/2021 at 9:08 AM  To page go to [email or web address]   [facility] -  Office  [phone]    See all Orders from [name] for further details    Objective:   Vitals:   03/27/21 2018 03/28/21 0116 03/28/21 0532 03/28/21 0750  BP: 123/81 108/81 113/83 127/84  Pulse: 61 68 72 82  Resp: 18 18 14 16   Temp: 98.6 F (37 C) 98.2 F (36.8 C) 97.8 F (36.6 C) 98 F (36.7 C)  TempSrc:  Oral    SpO2: 97% 94% 97% 94%  Weight:      Height:        Wt Readings from Last 3 Encounters:  03/27/21 63.5 kg  03/22/21 65.8 kg  03/18/21 70.3 kg     Intake/Output Summary (Last 24 hours) at 03/28/2021 0908 Last data filed at 03/27/2021 2200 Gross per 24 hour  Intake 1119 ml  Output 250 ml   Net 869 ml     Physical Exam  Awake Alert, No new F.N deficits, Normal affect Bates.AT,PERRAL Supple Neck,No JVD, No cervical lymphadenopathy appriciated.  Symmetrical Chest wall movement, Good air movement bilaterally, CTAB RRR,No Gallops,Rubs or new Murmurs, No Parasternal Heave +ve B.Sounds, Abd Soft, No tenderness, No organomegaly appriciated, No rebound - guarding or rigidity. No Cyanosis, Clubbing or edema, No new Rash or bruise      Data Review:    CBC Recent Labs  Lab 03/27/21 0837 03/28/21 0837  WBC 12.9*  12.9* 12.2*  HGB 15.6  15.6 13.7  HCT 44.4  43.8 38.2*  PLT 509*  476* 503*  MCV 90.4  90.1 91.4  MCH 31.8  32.1 32.8  MCHC 35.1  35.6 35.9  RDW 14.0  13.9 14.0  LYMPHSABS 2.0  --   MONOABS 1.7*  --   EOSABS 0.0  --   BASOSABS 0.0  --     Recent Labs  Lab 03/27/21 0837 03/27/21 1218  NA 137  --   K 3.1*  --   CL 105  --   CO2 21*  --   GLUCOSE 114*  --   BUN 21*  --   CREATININE 0.91  --   CALCIUM 8.9  --   AST 14*  --   ALT 19  --   ALKPHOS 106  --   BILITOT 1.0  --   ALBUMIN 3.5  --   LATICACIDVEN  --  0.9    ------------------------------------------------------------------------------------------------------------------ No results for input(s): CHOL, HDL, LDLCALC, TRIG, CHOLHDL, LDLDIRECT in the last 72 hours.  Lab Results  Component Value Date   HGBA1C 5.6 06/23/2020   ------------------------------------------------------------------------------------------------------------------ No results for input(s): TSH, T4TOTAL, T3FREE, THYROIDAB in the last 72 hours.  Invalid input(s): FREET3  Cardiac Enzymes No results for input(s): CKMB, TROPONINI, MYOGLOBIN in the last 168 hours.  Invalid input(s): CK ------------------------------------------------------------------------------------------------------------------ No results found for: BNP    Radiology Reports DG Chest 2 View  Result Date: 03/27/2021 CLINICAL DATA:   Cough. EXAM: CHEST - 2 VIEW COMPARISON:  September 12, 2011 FINDINGS: No pneumothorax. The cardiomediastinal silhouette is stable. Bibasilar pulmonary infiltrates, right greater than left identified. No other acute abnormalities. IMPRESSION: Bibasilar infiltrates consistent  with developing pneumonia or aspiration. Recommend follow-up to resolution. No other changes. Electronically Signed   By: Gerome Sam III M.D.   On: 03/27/2021 10:00   CT ABDOMEN PELVIS W CONTRAST  Result Date: 03/27/2021 CLINICAL DATA:  Dizziness and weakness.  Positive COVID. EXAM: CT ABDOMEN AND PELVIS WITH CONTRAST TECHNIQUE: Multidetector CT imaging of the abdomen and pelvis was performed using the standard protocol following bolus administration of intravenous contrast. CONTRAST:  89mL OMNIPAQUE IOHEXOL 350 MG/ML SOLN COMPARISON:  None. FINDINGS: Lower chest: Branching nodular pattern in the RIGHT lower lobe (so-called tree in bud) consistent with RIGHT lower lobe pulmonary infection. No focal consolidation. Hepatobiliary: Benign vascular enhancement along the anterior RIGHT hepatic lobe (image 10/2). No worrisome hepatic lesion. Postcholecystectomy Pancreas: Pancreas is normal. No ductal dilatation. No pancreatic inflammation. Spleen: Spleen is irregular lobular contour. Adrenals/urinary tract: Adrenal glands normal. There is mild persistent hydronephrosis and proximal hydroureter on the RIGHT which is improved from prior. Interval clearing of the mid RIGHT ureteral calculus seen on comparison CT exam. No ureteral calculi remain. No bladder calculi. LEFT kidney and ureter normal. Stomach/Bowel: Stomach and small bowel normal. Colon and rectosigmoid colon normal. Vascular/Lymphatic: Abdominal aorta is normal caliber with atherosclerotic calcification. There is no retroperitoneal or periportal lymphadenopathy. No pelvic lymphadenopathy. Reproductive: Prostate unremarkable. Other: No free fluid. Musculoskeletal: No aggressive osseous  lesion. IMPRESSION: 1. Tree-in-bud pattern in the RIGHT lower lobe consistent with pulmonary bronchopneumonia. 2. Mild residual hydronephrosis on the RIGHT related to prior RIGHT obstructing mid ureteral calculus. No obstructing calculus now present within the RIGHT ureter. 3. No clear explanation for LEFT lower quadrant pain as reported. Electronically Signed   By: Genevive Bi M.D.   On: 03/27/2021 11:46

## 2021-03-28 NOTE — Progress Notes (Signed)
PT Cancellation Note  Patient Details Name: Mark [name] MRN: [id number] DOB: March 24, 1961   Cancelled Treatment:    Reason Eval/Treat Not Completed: Other (comment). Pt with decreased K+ at 2.9 this date. Of note, also + orthostatic. Not appropriate for PT intervention at this time. Will re-attempt next available date.   Narissa Beaufort 03/28/2021, 9:19 AM Elizabeth Palau, PT, DPT [phone]

## 2021-03-29 ENCOUNTER — Ambulatory Visit: Payer: Medicare HMO | Admitting: Occupational Therapy

## 2021-03-29 ENCOUNTER — Ambulatory Visit: Payer: Medicare HMO | Admitting: Physical Therapy

## 2021-03-29 DIAGNOSIS — R42 Dizziness and giddiness: Secondary | ICD-10-CM | POA: Diagnosis not present

## 2021-03-29 DIAGNOSIS — J189 Pneumonia, unspecified organism: Secondary | ICD-10-CM | POA: Diagnosis not present

## 2021-03-29 LAB — CBC WITH DIFFERENTIAL/PLATELET
Abs Immature Granulocytes: 0.07 10*3/uL (ref 0.00–0.07)
Basophils Absolute: 0 10*3/uL (ref 0.0–0.1)
Basophils Relative: 0 %
Eosinophils Absolute: 0 10*3/uL (ref 0.0–0.5)
Eosinophils Relative: 0 %
HCT: 39.5 % (ref 39.0–52.0)
Hemoglobin: 13.8 g/dL (ref 13.0–17.0)
Immature Granulocytes: 1 %
Lymphocytes Relative: 15 %
Lymphs Abs: 1.5 10*3/uL (ref 0.7–4.0)
MCH: 30.8 pg (ref 26.0–34.0)
MCHC: 34.9 g/dL (ref 30.0–36.0)
MCV: 88.2 fL (ref 80.0–100.0)
Monocytes Absolute: 0.3 10*3/uL (ref 0.1–1.0)
Monocytes Relative: 3 %
Neutro Abs: 8 10*3/uL — ABNORMAL HIGH (ref 1.7–7.7)
Neutrophils Relative %: 81 %
Platelets: 570 10*3/uL — ABNORMAL HIGH (ref 150–400)
RBC: 4.48 MIL/uL (ref 4.22–5.81)
RDW: 13.8 % (ref 11.5–15.5)
WBC: 9.9 10*3/uL (ref 4.0–10.5)
nRBC: 0 % (ref 0.0–0.2)

## 2021-03-29 LAB — COMPREHENSIVE METABOLIC PANEL
ALT: 21 U/L (ref 0–44)
AST: 12 U/L — ABNORMAL LOW (ref 15–41)
Albumin: 3.1 g/dL — ABNORMAL LOW (ref 3.5–5.0)
Alkaline Phosphatase: 124 U/L (ref 38–126)
Anion gap: 9 (ref 5–15)
BUN: 8 mg/dL (ref 6–20)
CO2: 23 mmol/L (ref 22–32)
Calcium: 8.6 mg/dL — ABNORMAL LOW (ref 8.9–10.3)
Chloride: 108 mmol/L (ref 98–111)
Creatinine, Ser: 0.61 mg/dL (ref 0.61–1.24)
GFR, Estimated: >60 mL/min (ref >60)
Glucose, Bld: 135 mg/dL — ABNORMAL HIGH (ref 70–99)
Potassium: 3.5 mmol/L (ref 3.5–5.1)
Sodium: 140 mmol/L (ref 135–145)
Total Bilirubin: 0.7 mg/dL (ref 0.3–1.2)
Total Protein: 6.6 g/dL (ref 6.5–8.1)

## 2021-03-29 LAB — MAGNESIUM: Magnesium: 2.1 mg/dL (ref 1.7–2.4)

## 2021-03-29 LAB — BRAIN NATRIURETIC PEPTIDE: B Natriuretic Peptide: 182.2 pg/mL — ABNORMAL HIGH (ref 0.0–100.0)

## 2021-03-29 LAB — PROCALCITONIN: Procalcitonin: 3.27 ng/mL

## 2021-03-29 MED ORDER — POTASSIUM CHLORIDE 2 MEQ/ML IV SOLN
INTRAVENOUS | Status: AC
  Filled 2021-03-29 (×3): qty 1000

## 2021-03-29 MED ORDER — POTASSIUM CHLORIDE CRYS ER 20 MEQ PO TBCR
20.0000 meq | EXTENDED_RELEASE_TABLET | Freq: Once | ORAL | Status: AC
  Administered 2021-03-29: 10:00:00 20 meq via ORAL
  Filled 2021-03-29: qty 1

## 2021-03-29 NOTE — Progress Notes (Signed)
PROGRESS NOTE                                                                                                                                                                                                             Patient Demographics:    Mark [name], is a 60 y.o. male, DOB - February 11, 1961, YTK:[id number]  Outpatient Primary MD for the patient is Marjie Skiff, NP    LOS - 1  Admit date - 03/27/2021    Chief Complaint  Patient presents with   Dizziness       Brief Narrative (HPI from H&P)    - Mark [name] is a 60 y.o. male with medical history significant for depression history of kidney stones hypertension history of stroke who presented to the emergency room with complaint of dizziness and feeling generally weak, had preceding history of diarrhea and some generalized abdominal pain for the last few days along with a mild cough which was productive of yellow phlegm.  In the ER he was diagnosed with COVID-19 infection, bacterial pneumonia, COVID-19 gastroenteritis with severe dehydration and hypotension and admitted to the hospital.   Subjective:   Patient in bed in no distress, says he feels a lot better today, does have intermittent chronic abdominal cramping and pain but says this is off-and-on chronic for him, diarrhea much better, no more nausea vomiting.  No chest pain or shortness of breath.   Assessment  & Plan :       Acute with 19 infection with bacterial pneumonia and possible COVID-19 gastroenteritis - he has evidence of bacterial pneumonia for which he is on IV antibiotics, he has been aggressively hydrated with IV fluids and his blood pressures and orthostatics are much improved.  Monitor closely his inflammatory markers and him clinically.  Encouraged the patient to sit up in chair in the daytime use I-S and flutter valve for pulmonary toiletry.  Will advance activity and titrate down oxygen as  possible.   SpO2: 98 %  Recent Labs  Lab 03/27/21 0837 03/27/21 1218 03/28/21 0837 03/29/21 0454  WBC 12.9*  12.9*  --  12.2* 9.9  HGB 15.6  15.6  --  13.7 13.8  HCT 44.4  43.8  --  38.2* 39.5  PLT 509*  476*  --  503* 570*  BNP  --   --  81.6 182.2*  PROCALCITON  --   --  6.62 3.27  AST 14*  --   --  12*  ALT 19  --   --  21  ALKPHOS 106  --   --  124  BILITOT 1.0  --   --  0.7  ALBUMIN 3.5  --   --  3.1*  LATICACIDVEN  --  0.9  --   --     2.  Severe dehydration with orthostatic hypotension.  Hydrate with IV fluids, improved.  3.  Bowel resection.  Abdominal CT nonacute, chronic ureteric obstruction and hydronephrosis noted, no concerning symptoms will monitor closely.  Abdominal exam is benign for now.  He does have chronic intermittent abdominal pain and discomfort which is at his baseline, his symptoms are actually much improved.  Continue supportive care.  4.  Gastroenteritis.  Most likely COVID, ruled out C. difficile.  Gastroenteritis was present on admission.  With supportive care improving.   5.  Essential hypertension.  Blood pressure low hold blood pressure medications and hydrate.  6.  Hypokalemia.  Replaced PO + IV.  Improved.       Condition - Extremely Guarded  Family Communication  :  none present  Code Status :  Full  Consults  :  None  PUD Prophylaxis : PPI   Procedures  :     CT  - 1. Tree-in-bud pattern in the RIGHT lower lobe consistent with pulmonary bronchopneumonia. 2. Mild residual hydronephrosis on the RIGHT related to prior RIGHT obstructing mid ureteral calculus. No obstructing calculus now present within the RIGHT ureter. 3. No clear explanation for LEFT lower quadrant pain as reported.      Disposition Plan  :    Status is: Observation   Dispo: The patient is from: Home              Anticipated d/c is to: Home              Patient currently is not medically stable to d/c.   Difficult to place patient No  DVT  Prophylaxis  :    enoxaparin (LOVENOX) injection 40 mg Start: 03/27/21 1515 Place TED hose Start: 03/27/21 1503    Lab Results  Component Value Date   PLT 570 (H) 03/29/2021    Diet :  Diet Order             Diet clear liquid Room service appropriate? Yes; Fluid consistency: Thin  Diet effective now                    Inpatient Medications  Scheduled Meds:  aspirin EC  81 mg Oral Daily   azithromycin  500 mg Oral Daily   citalopram  20 mg Oral Daily   enoxaparin (LOVENOX) injection  40 mg Subcutaneous Q24H   ferrous sulfate  325 mg Oral Q breakfast   melatonin  10 mg Oral QHS   methylPREDNISolone (SOLU-MEDROL) injection  40 mg Intravenous Q12H   metoprolol tartrate  25 mg Oral BID   pantoprazole  40 mg Oral Daily   potassium chloride  20 mEq Oral Once   pravastatin  20 mg Oral Daily   sucralfate  1 g Oral TID WC & HS   tamsulosin  0.4 mg Oral Daily   cyanocobalamin  1,000 mcg Oral Daily   Continuous Infusions:  cefTRIAXone (ROCEPHIN)  IV 200 mL/hr at 03/28/21 1516   lactated ringers with kcl     promethazine (  PHENERGAN) injection (IM or IVPB) Stopped (03/28/21 1459)   PRN Meds:.acetaminophen, albuterol, diphenhydrAMINE, morphine injection, ondansetron, ondansetron (ZOFRAN) IV, promethazine (PHENERGAN) injection (IM or IVPB), traMADol  Antibiotics Given (last 72 hours)     Date/Time Action Medication Dose Rate   03/27/21 1248 New Bag/Given   levofloxacin (LEVAQUIN) IVPB 500 mg 500 mg 100 mL/hr   03/27/21 1728 New Bag/Given   cefTRIAXone (ROCEPHIN) 1 g in sodium chloride 0.9 % 100 mL IVPB 1 g 200 mL/hr   03/28/21 0818 Given   azithromycin (ZITHROMAX) tablet 500 mg 500 mg    03/28/21 1513 New Bag/Given   cefTRIAXone (ROCEPHIN) 1 g in sodium chloride 0.9 % 100 mL IVPB 1 g 200 mL/hr         Time Spent in minutes  30   Susa RaringPrashant Kauri Garson M.D on 03/29/2021 at 8:15 AM  To page go to [email or web address]   [facility] -  Office  [phone]    See all  Orders from today for further details    Objective:   Vitals:   03/28/21 1657 03/28/21 2017 03/28/21 2352 03/29/21 0447  BP: 127/80 130/78 (!) 151/98 137/87  Pulse: 98 84 70 70  Resp: 16 16 16 16   Temp: 97.8 F (36.6 C) 97.8 F (36.6 C) 97.8 F (36.6 C)   TempSrc:      SpO2: 96% 100% 98% 98%  Weight:      Height:        Wt Readings from Last 3 Encounters:  03/27/21 63.5 kg  03/22/21 65.8 kg  03/18/21 70.3 kg     Intake/Output Summary (Last 24 hours) at 03/29/2021 0815 Last data filed at 03/29/2021 0500 Gross per 24 hour  Intake 983.84 ml  Output 3050 ml  Net -2066.16 ml     Physical Exam  Awake Alert, No new F.N deficits, Normal affect [place].AT,PERRAL Supple Neck,No JVD, No cervical lymphadenopathy appriciated.  Symmetrical Chest wall movement, Good air movement bilaterally, CTAB RRR,No Gallops, Rubs or new Murmurs, No Parasternal Heave +ve B.Sounds, Abd Soft, No tenderness, No organomegaly appriciated, No rebound - guarding or rigidity. No Cyanosis, Clubbing or edema, No new Rash or bruise      Data Review:    CBC Recent Labs  Lab 03/27/21 0837 03/28/21 0837 03/29/21 0454  WBC 12.9*  12.9* 12.2* 9.9  HGB 15.6  15.6 13.7 13.8  HCT 44.4  43.8 38.2* 39.5  PLT 509*  476* 503* 570*  MCV 90.4  90.1 91.4 88.2  MCH 31.8  32.1 32.8 30.8  MCHC 35.1  35.6 35.9 34.9  RDW 14.0  13.9 14.0 13.8  LYMPHSABS 2.0  --  1.5  MONOABS 1.7*  --  0.3  EOSABS 0.0  --  0.0  BASOSABS 0.0  --  0.0    Recent Labs  Lab 03/27/21 0837 03/27/21 1218 03/28/21 0837 03/29/21 0454  NA 137  --  136 140  K 3.1*  --  2.9* 3.5  CL 105  --  105 108  CO2 21*  --  19* 23  GLUCOSE 114*  --  97 135*  BUN 21*  --  13 8  CREATININE 0.91  --  0.90 0.61  CALCIUM 8.9  --  8.3* 8.6*  AST 14*  --   --  12*  ALT 19  --   --  21  ALKPHOS 106  --   --  124  BILITOT 1.0  --   --  0.7  ALBUMIN 3.5  --   --  3.1*  MG  --   --  2.0 2.1  PROCALCITON  --   --  6.62 3.27  LATICACIDVEN   --  0.9  --   --   BNP  --   --  81.6 182.2*    ------------------------------------------------------------------------------------------------------------------ No results for input(s): CHOL, HDL, LDLCALC, TRIG, CHOLHDL, LDLDIRECT in the last 72 hours.  Lab Results  Component Value Date   HGBA1C 5.6 06/23/2020   ------------------------------------------------------------------------------------------------------------------ No results for input(s): TSH, T4TOTAL, T3FREE, THYROIDAB in the last 72 hours.  Invalid input(s): FREET3  Cardiac Enzymes No results for input(s): CKMB, TROPONINI, MYOGLOBIN in the last 168 hours.  Invalid input(s): CK ------------------------------------------------------------------------------------------------------------------    Component Value Date/Time   BNP 182.2 (H) 03/29/2021 0454      Radiology Reports DG Chest 2 View  Result Date: 03/27/2021 CLINICAL DATA:  Cough. EXAM: CHEST - 2 VIEW COMPARISON:  September 12, 2011 FINDINGS: No pneumothorax. The cardiomediastinal silhouette is stable. Bibasilar pulmonary infiltrates, right greater than left identified. No other acute abnormalities. IMPRESSION: Bibasilar infiltrates consistent with developing pneumonia or aspiration. Recommend follow-up to resolution. No other changes. Electronically Signed   By: Gerome Sam III M.D.   On: 03/27/2021 10:00   DG Abd 1 View  Result Date: 03/28/2021 CLINICAL DATA:  Nausea and abdominal pain EXAM: ABDOMEN - 1 VIEW COMPARISON:  CT from the previous day. FINDINGS: Scattered large and small bowel gas is noted. Postsurgical changes are noted on the right. Multiple ingested tablets are seen. No bony abnormality is noted. IMPRESSION: No acute abnormality noted. Electronically Signed   By: Alcide Clever M.D.   On: 03/28/2021 17:13   CT ABDOMEN PELVIS W CONTRAST  Result Date: 03/27/2021 CLINICAL DATA:  Dizziness and weakness.  Positive COVID. EXAM: CT ABDOMEN AND PELVIS  WITH CONTRAST TECHNIQUE: Multidetector CT imaging of the abdomen and pelvis was performed using the standard protocol following bolus administration of intravenous contrast. CONTRAST:  52mL OMNIPAQUE IOHEXOL 350 MG/ML SOLN COMPARISON:  None. FINDINGS: Lower chest: Branching nodular pattern in the RIGHT lower lobe (so-called tree in bud) consistent with RIGHT lower lobe pulmonary infection. No focal consolidation. Hepatobiliary: Benign vascular enhancement along the anterior RIGHT hepatic lobe (image 10/2). No worrisome hepatic lesion. Postcholecystectomy Pancreas: Pancreas is normal. No ductal dilatation. No pancreatic inflammation. Spleen: Spleen is irregular lobular contour. Adrenals/urinary tract: Adrenal glands normal. There is mild persistent hydronephrosis and proximal hydroureter on the RIGHT which is improved from prior. Interval clearing of the mid RIGHT ureteral calculus seen on comparison CT exam. No ureteral calculi remain. No bladder calculi. LEFT kidney and ureter normal. Stomach/Bowel: Stomach and small bowel normal. Colon and rectosigmoid colon normal. Vascular/Lymphatic: Abdominal aorta is normal caliber with atherosclerotic calcification. There is no retroperitoneal or periportal lymphadenopathy. No pelvic lymphadenopathy. Reproductive: Prostate unremarkable. Other: No free fluid. Musculoskeletal: No aggressive osseous lesion. IMPRESSION: 1. Tree-in-bud pattern in the RIGHT lower lobe consistent with pulmonary bronchopneumonia. 2. Mild residual hydronephrosis on the RIGHT related to prior RIGHT obstructing mid ureteral calculus. No obstructing calculus now present within the RIGHT ureter. 3. No clear explanation for LEFT lower quadrant pain as reported. Electronically Signed   By: Genevive Bi M.D.   On: 03/27/2021 11:46

## 2021-03-29 NOTE — Evaluation (Signed)
Physical Therapy Evaluation Patient Details Name: Mark [name] MRN: [id number] DOB: [birth year] Today's Date: 03/29/2021   History of Present Illness  Pt admitted for dizziness with complaints of weakness and dizziness. History includes depression, HTN, and old CVA.  Clinical Impression  Pt is a pleasant 60 year old male who was admitted for dizziness. Pt performs bed mobility with mod I and transfers with min assist with HHA. Further mobility efforts deferred due to symptomatic dizziness. Not safe for ambulation at this time. Advised RN to perform OOB to [facility] vs ambulation at this time. Pt demonstrates deficits with strength/mobility/balance. Very limited family support. Would benefit from skilled PT to address above deficits and promote optimal return to PLOF; recommend transition to STR upon discharge from acute hospitalization.  Orthostatics: Supine: 159/90 Sit: 132/97 Stand: 114/86     Follow Up Recommendations SNF (however may progress quickly as dizziness improves)    Equipment Recommendations  Rolling walker with 5" wheels    Recommendations for Other Services       Precautions / Restrictions Precautions Precautions: Fall Restrictions Weight Bearing Restrictions: No      Mobility  Bed Mobility Overal bed mobility: Modified Independent             General bed mobility comments: able to come to sitting safely with upright posture, however reports dizziness noted.    Transfers Overall transfer level: Needs assistance Equipment used: 1 person hand held assist Transfers: Sit to/from Stand Sit to Stand: Min assist         General transfer comment: unsteady and reports dizziness once standing. Braces B LE against bed and only able to tolerate 20-30 seconds prior to needing to return sitting. Further mobility deferred at this time.  Ambulation/Gait             General Gait Details: not safe due to dizziness  Stairs            Wheelchair  Mobility    Modified Rankin (Stroke Patients Only)       Balance Overall balance assessment: Needs assistance Sitting-balance support: Feet supported;No upper extremity supported Sitting balance-Leahy Scale: Good     Standing balance support: Single extremity supported Standing balance-Leahy Scale: Poor Standing balance comment: post bias and dizziness                             Pertinent Vitals/Pain Pain Assessment: No/denies pain    Home Living Family/patient expects to be discharged to:: Private residence Living Arrangements: Alone Available Help at Discharge: Friend(s);Available PRN/intermittently Type of Home: House Home Access: Stairs to enter Entrance Stairs-Rails: Can reach both Entrance Stairs-Number of Steps: 4 Home Layout: One level Home Equipment: None Additional Comments: reports his mom recently died, limited support at home    Prior Function Level of Independence: Independent         Comments: reports no residual weakness, indep prior however doesn't drive-depends on friends for transportation     Hand Dominance        Extremity/Trunk Assessment   Upper Extremity Assessment Upper Extremity Assessment: Overall WFL for tasks assessed (however demonstrates functional fatigue with exertion)    Lower Extremity Assessment Lower Extremity Assessment: Generalized weakness (5/5 strength in bed, however weakness noted in functional mobility efforts)       Communication   Communication: No difficulties  Cognition Arousal/Alertness: Awake/alert Behavior During Therapy: WFL for tasks assessed/performed Overall Cognitive Status: Within Functional Limits for tasks assessed  General Comments: upset due to continued dizziness symptoms noted.      General Comments      Exercises Other Exercises Other Exercises: attempted to brush teeth in standing, however deferred due to dizziness. Able to  brush teeth while seated in bed and able to don socks with supervision. Other Exercises: supine ther-ex performed on B LE including SLRs and bridging. 5 reps and pt self limiting due to fatigue   Assessment/Plan    PT Assessment Patient needs continued PT services  PT Problem List Decreased strength;Decreased activity tolerance;Decreased balance;Decreased mobility;Decreased knowledge of use of DME       PT Treatment Interventions DME instruction;Gait training;Therapeutic exercise;Balance training    PT Goals (Current goals can be found in the Care Plan section)  Acute Rehab PT Goals Patient Stated Goal: to become independent PT Goal Formulation: With patient Time For Goal Achievement: 04/12/21 Potential to Achieve Goals: Good    Frequency Min 2X/week   Barriers to discharge        Co-evaluation               AM-PAC PT "6 Clicks" Mobility  Outcome Measure Help needed turning from your back to your side while in a flat bed without using bedrails?: None Help needed moving from lying on your back to sitting on the side of a flat bed without using bedrails?: None Help needed moving to and from a bed to a chair (including a wheelchair)?: A Little Help needed standing up from a chair using your arms (e.g., wheelchair or bedside chair)?: A Little Help needed to walk in hospital room?: A Lot Help needed climbing 3-5 steps with a railing? : A Lot 6 Click Score: 18    End of Session   Activity Tolerance:  (limited due to dizziness) Patient left: in bed;with bed alarm set Nurse Communication: Mobility status (called about IV site burning) PT Visit Diagnosis: Unsteadiness on feet (R26.81);Muscle weakness (generalized) (M62.81);Difficulty in walking, not elsewhere classified (R26.2);Dizziness and giddiness (R42)    Time: 8850-2774 PT Time Calculation (min) (ACUTE ONLY): 17 min   Charges:   PT Evaluation $PT Eval Low Complexity: 1 Low PT Treatments $Therapeutic Activity:  8-22 mins        Elizabeth Palau, PT, DPT [phone]   [name] 03/29/2021, 10:24 AM

## 2021-03-29 NOTE — Evaluation (Signed)
Occupational Therapy Evaluation Patient Details Name: Mark [name] MRN: [id number] DOB: 08/09/1961 Today's Date: 03/29/2021    History of Present Illness Pt admitted for dizziness with complaints of weakness and dizziness. History includes depression, HTN, and old CVA.   Clinical Impression   Pt seen for OT evaluation this date. Upon arrival to room, pt supine in bed, complaining of overall discomfort but reporting no pain. Pt agreeable to OT evaluation/tx. Prior to admission, pt reports that he was independent with ADLs and functional mobility, living alone in a 1-level home with 4 steps to enter. Pt reports that closest family member (adult son) lives in [place] and pt is uncertain if he will be able to assist upon discharge.   Pt currently presents with decreased activity tolerance and decreased balance. Due to these functional impairments, pt requires SUPERVISION/SET-UP for seated grooming tasks and SUPERVISION for seated LB dressing. Of note, pt able to perform bed mobility MOD-I, but declined OOB mobility this date d/t general discomfort. Additionally, pt with elevated BP during session (Supine BP 167/96; Sitting BP148/100). RN informed. Pt would benefit from additional skilled OT services to maximize return to PLOF and minimize risk of future falls, injury, caregiver burden, and readmission. Upon discharge, recommend SNF, however pt may progress quickly as symptoms improve.     Follow Up Recommendations  SNF    Equipment Recommendations  Other (comment) (defer to next venue of care)       Precautions / Restrictions Precautions Precautions: Fall Restrictions Weight Bearing Restrictions: No      Mobility Bed Mobility Overal bed mobility: Modified Independent             General bed mobility comments: able to come to sitting safely    Transfers                 General transfer comment: pt declining OOB mobility this date    Balance Overall balance assessment:  Needs assistance Sitting-balance support: Feet supported;No upper extremity supported Sitting balance-Leahy Scale: Good Sitting balance - Comments: Good sitting balance while sitting EOB to don/doff socks via figure 4 position       Standing balance comment: did not assess this date                           ADL either performed or assessed with clinical judgement   ADL Overall ADL's : Needs assistance/impaired     Grooming: Wash/dry face;Supervision/safety;Set up;Sitting               Lower Body Dressing: Supervision/safety;Set up;Sitting/lateral leans Lower Body Dressing Details (indicate cue type and reason): to don socks                    Pertinent Vitals/Pain Pain Assessment: No/denies pain        Extremity/Trunk Assessment Upper Extremity Assessment Upper Extremity Assessment: Overall WFL for tasks assessed   Lower Extremity Assessment Lower Extremity Assessment: Generalized weakness       Communication Communication Communication: No difficulties   Cognition Arousal/Alertness: Awake/alert Behavior During Therapy: WFL for tasks assessed/performed Overall Cognitive Status: No family/caregiver present to determine baseline cognitive functioning                                 General Comments: Pt groaning throughout session, expressing overall discomfort but reporting no pain. Pt voicing frustrations with room temperature and  IV beeping earlier in afternoon, however pt unable to be re-directed despite this author's attempts to address concerns   General Comments  Pt with elevated BP during session (Supine BP 167/96; Sitting BP148/100; pt declining attempts to stand). Pt asymptomatic throughout. RN informed            Home Living Family/patient expects to be discharged to:: Private residence Living Arrangements: Alone Available Help at Discharge: Friend(s);Available PRN/intermittently Type of Home: House Home Access:  Stairs to enter [employer] of Steps: 4 Entrance Stairs-Rails: Can reach both Home Layout: One level               Home Equipment: None   Additional Comments: Pt reports his mom recently died. Adult son lives in [place]. Limited support at home.      Prior Functioning/Environment Level of Independence: Independent        Comments: Pt reports being independent with ADLs and functional mobility a baseline.        OT Problem List: Decreased activity tolerance;Impaired balance (sitting and/or standing);Decreased strength      OT Treatment/Interventions: Self-care/ADL training;Therapeutic exercise;Energy conservation;DME and/or AE instruction;Therapeutic activities;Patient/family education;Balance training    OT Goals(Current goals can be found in the care plan section) Acute Rehab OT Goals Patient Stated Goal: to feel better OT Goal Formulation: With patient Time For Goal Achievement: 04/12/21 Potential to Achieve Goals: Good ADL Goals Pt Will Perform Grooming: with min guard assist;standing Pt Will Transfer to Toilet: with min guard assist;stand pivot transfer;bedside commode Pt Will Perform Toileting - Clothing Manipulation and hygiene: with min assist;sitting/lateral leans  OT Frequency: Min 2X/week    AM-PAC OT "6 Clicks" Daily Activity     Outcome Measure Help from another person eating meals?: None Help from another person taking care of personal grooming?: A Little Help from another person toileting, which includes using toliet, bedpan, or urinal?: A Little Help from another person bathing (including washing, rinsing, drying)?: A Lot Help from another person to put on and taking off regular upper body clothing?: A Little Help from another person to put on and taking off regular lower body clothing?: A Little 6 Click Score: 18   End of Session Nurse Communication: Mobility status;Other (comment) (elevated BP)  Activity Tolerance: Patient tolerated  treatment well Patient left: in bed;with call bell/phone within reach;with bed alarm set  OT Visit Diagnosis: Muscle weakness (generalized) (M62.81);Unsteadiness on feet (R26.81)                Time: 1442-1500 OT Time Calculation (min): 18 min Charges:  OT General Charges $OT Visit: 1 Visit OT Evaluation $OT Eval Moderate Complexity: 1 Mod OT Treatments $Self Care/Home Management : 8-22 mins  Matthew Folks, OTR/L [facility] [phone]

## 2021-03-30 ENCOUNTER — Inpatient Hospital Stay: Payer: Medicare HMO

## 2021-03-30 DIAGNOSIS — R42 Dizziness and giddiness: Secondary | ICD-10-CM | POA: Diagnosis not present

## 2021-03-30 DIAGNOSIS — J189 Pneumonia, unspecified organism: Secondary | ICD-10-CM | POA: Diagnosis not present

## 2021-03-30 LAB — COMPREHENSIVE METABOLIC PANEL
ALT: 16 U/L (ref 0–44)
AST: 16 U/L (ref 15–41)
Albumin: 3.4 g/dL — ABNORMAL LOW (ref 3.5–5.0)
Alkaline Phosphatase: 118 U/L (ref 38–126)
Anion gap: 8 (ref 5–15)
BUN: 8 mg/dL (ref 6–20)
CO2: 29 mmol/L (ref 22–32)
Calcium: 9 mg/dL (ref 8.9–10.3)
Chloride: 104 mmol/L (ref 98–111)
Creatinine, Ser: 0.91 mg/dL (ref 0.61–1.24)
GFR, Estimated: >60 mL/min (ref >60)
Glucose, Bld: 125 mg/dL — ABNORMAL HIGH (ref 70–99)
Potassium: 4.1 mmol/L (ref 3.5–5.1)
Sodium: 141 mmol/L (ref 135–145)
Total Bilirubin: 0.9 mg/dL (ref 0.3–1.2)
Total Protein: 6.9 g/dL (ref 6.5–8.1)

## 2021-03-30 LAB — CBC WITH DIFFERENTIAL/PLATELET
Abs Immature Granulocytes: 0.09 10*3/uL — ABNORMAL HIGH (ref 0.00–0.07)
Basophils Absolute: 0 10*3/uL (ref 0.0–0.1)
Basophils Relative: 0 %
Eosinophils Absolute: 0 10*3/uL (ref 0.0–0.5)
Eosinophils Relative: 0 %
HCT: 43.3 % (ref 39.0–52.0)
Hemoglobin: 15 g/dL (ref 13.0–17.0)
Immature Granulocytes: 1 %
Lymphocytes Relative: 12 %
Lymphs Abs: 1.6 10*3/uL (ref 0.7–4.0)
MCH: 31.1 pg (ref 26.0–34.0)
MCHC: 34.6 g/dL (ref 30.0–36.0)
MCV: 89.6 fL (ref 80.0–100.0)
Monocytes Absolute: 0.7 10*3/uL (ref 0.1–1.0)
Monocytes Relative: 5 %
Neutro Abs: 10.8 10*3/uL — ABNORMAL HIGH (ref 1.7–7.7)
Neutrophils Relative %: 82 %
Platelets: 628 10*3/uL — ABNORMAL HIGH (ref 150–400)
RBC: 4.83 MIL/uL (ref 4.22–5.81)
RDW: 14.2 % (ref 11.5–15.5)
WBC: 13.3 10*3/uL — ABNORMAL HIGH (ref 4.0–10.5)
nRBC: 0 % (ref 0.0–0.2)

## 2021-03-30 LAB — PROCALCITONIN: Procalcitonin: 1.31 ng/mL

## 2021-03-30 LAB — MAGNESIUM: Magnesium: 2.1 mg/dL (ref 1.7–2.4)

## 2021-03-30 LAB — BRAIN NATRIURETIC PEPTIDE: B Natriuretic Peptide: 236.9 pg/mL — ABNORMAL HIGH (ref 0.0–100.0)

## 2021-03-30 MED ORDER — LOPERAMIDE HCL 2 MG PO CAPS
2.0000 mg | ORAL_CAPSULE | Freq: Four times a day (QID) | ORAL | Status: DC | PRN
  Administered 2021-03-30 – 2021-04-03 (×4): 2 mg via ORAL
  Filled 2021-03-30 (×4): qty 1

## 2021-03-30 MED ORDER — METHYLPREDNISOLONE SODIUM SUCC 40 MG IJ SOLR
40.0000 mg | Freq: Every day | INTRAMUSCULAR | Status: DC
Start: 2021-03-31 — End: 2021-03-30

## 2021-03-30 MED ORDER — METHYLPREDNISOLONE SODIUM SUCC 40 MG IJ SOLR
20.0000 mg | Freq: Once | INTRAMUSCULAR | Status: AC
  Administered 2021-03-31: 08:00:00 20 mg via INTRAVENOUS
  Filled 2021-03-30: qty 1

## 2021-03-30 MED ORDER — AMLODIPINE BESYLATE 5 MG PO TABS
5.0000 mg | ORAL_TABLET | Freq: Every day | ORAL | Status: DC
  Administered 2021-03-30 – 2021-04-04 (×6): 5 mg via ORAL
  Filled 2021-03-30 (×6): qty 1

## 2021-03-30 NOTE — [place] FL2 (Signed)
[place] MEDICAID FL2 LEVEL OF CARE SCREENING TOOL     IDENTIFICATION  Patient Name: Mark [name] Birthdate: 1961/06/12 Sex: male Admission Date (Current Location): 03/27/2021  [facility] and [place] Number:  Chiropodist and Address:  [facility], [address] [place], [place] [address]      Provider Number: [id number]  Attending Physician Name and Address:  Leroy Sea, MD  Relative Name and Phone Number:  Efstathios Sawin (son) [phone]    Current Level of Care: Hospital Recommended Level of Care: Skilled Nursing Facility Prior Approval Number:    Date Approved/Denied:   PASRR Number: pending  Discharge Plan: SNF    Current Diagnoses: Patient Active Problem List   Diagnosis Date Noted   Dizziness and giddiness 03/27/2021   Grief reaction 11/15/2020   Dieulafoy lesion of stomach    Aortic atherosclerosis (HCC) 05/13/2020   Back pain 01/02/2020   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   History of kidney stones 05/09/2019   Pulmonary nodule 05/09/2019   Paraseptal emphysema (HCC) 05/09/2019   Nicotine dependence, cigarettes, w unsp disorders 04/30/2019   Hyperlipidemia LDL goal <70 09/05/2018   Elevated hemoglobin A1c 09/03/2018   Cognitive impairment 12/15/2016   History of opioid abuse (HCC) 12/30/2015   History of intestinal obstruction 05/03/2015   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   Anxiety and depression 05/03/2015   Hypertension 05/03/2015   Aphasia with stroke 05/03/2015   History of CVA (cerebrovascular accident) 05/03/2015    Orientation RESPIRATION BLADDER Height & Weight     Self, Situation, Place  Normal Continent Weight: 63.5 kg Height:  5\' 9"  (175.3 cm)  BEHAVIORAL SYMPTOMS/MOOD NEUROLOGICAL BOWEL NUTRITION STATUS      Continent Diet (see discharge summary)  AMBULATORY STATUS COMMUNICATION OF NEEDS Skin   Extensive Assist Verbally Normal                        Personal Care Assistance Level of Assistance  Bathing, Feeding, Dressing Bathing Assistance: Limited assistance Feeding assistance: Limited assistance Dressing Assistance: Limited assistance     Functional Limitations Info             SPECIAL CARE FACTORS FREQUENCY  PT (By licensed PT), OT (By licensed OT)     PT Frequency: 5 times per week OT Frequency: 5 times per week            Contractures Contractures Info: Not present    Additional Factors Info  Code Status, Allergies Code Status Info: Full Allergies Info: Codeine           Current Medications (03/30/2021):  This is the current hospital active medication list Current Facility-Administered Medications  Medication Dose Route Frequency Provider Last Rate Last Admin   acetaminophen (TYLENOL) tablet 650 mg  650 mg Oral Q6H PRN 04/01/2021, MD   650 mg at 03/29/21 2108   albuterol (VENTOLIN HFA) 108 (90 Base) MCG/ACT inhaler 2 puff  2 puff Inhalation Q6H PRN 2109, MD   2 puff at 03/28/21 1637   amLODipine (NORVASC) tablet 5 mg  5 mg Oral Daily 03/30/21, MD   5 mg at 03/30/21 0841   aspirin EC tablet 81 mg  81 mg Oral Daily 04/01/21, MD   81 mg at 03/30/21 0841   azithromycin (ZITHROMAX) tablet 500 mg  500 mg Oral Daily 04/01/21, MD   500 mg at 03/30/21 [phone]  cefTRIAXone (ROCEPHIN) 1 g in sodium chloride 0.9 % 100 mL IVPB  1 g Intravenous Q24H Susa Raring K, MD 200 mL/hr at 03/30/21 1603 1 g at 03/30/21 1603   citalopram (CELEXA) tablet 20 mg  20 mg Oral Daily Myrtie Neither, MD   20 mg at 03/30/21 0841   enoxaparin (LOVENOX) injection 40 mg  40 mg Subcutaneous Q24H Myrtie Neither, MD   40 mg at 03/30/21 1429   ferrous sulfate tablet 325 mg  325 mg Oral Q breakfast Myrtie Neither, MD   325 mg at 03/30/21 0846   loperamide (IMODIUM) capsule 2 mg  2 mg Oral Q6H PRN Leroy Sea, MD   2 mg at 03/30/21 1153   melatonin tablet 10 mg  10 mg Oral QHS  Myrtie Neither, MD   10 mg at 03/29/21 2107   [START ON 03/31/2021] methylPREDNISolone sodium succinate (SOLU-MEDROL) 40 mg/mL injection 20 mg  20 mg Intravenous Once Leroy Sea, MD       metoprolol tartrate (LOPRESSOR) tablet 25 mg  25 mg Oral BID Myrtie Neither, MD   25 mg at 03/30/21 0841   morphine 2 MG/ML injection 1 mg  1 mg Intravenous Q4H PRN Myrtie Neither, MD   1 mg at 03/30/21 0846   ondansetron (ZOFRAN) 4 MG/5ML solution 4 mg  4 mg Oral Q8H PRN Myrtie Neither, MD       ondansetron [facility]) injection 4 mg  4 mg Intravenous Q8H PRN Myrtie Neither, MD   4 mg at 03/30/21 1025   pantoprazole (PROTONIX) EC tablet 40 mg  40 mg Oral Daily Myrtie Neither, MD   40 mg at 03/30/21 0841   pravastatin (PRAVACHOL) tablet 20 mg  20 mg Oral Daily Myrtie Neither, MD   20 mg at 03/30/21 0841   promethazine (PHENERGAN) 25 mg in sodium chloride 0.9 % 50 mL IVPB  25 mg Intravenous Q8H PRN Susa Raring K, MD 200 mL/hr at 03/30/21 1510 25 mg at 03/30/21 1510   sucralfate (CARAFATE) tablet 1 g  1 g Oral TID WC & HS Myrtie Neither, MD   1 g at 03/30/21 1601   tamsulosin (FLOMAX) capsule 0.4 mg  0.4 mg Oral Daily Myrtie Neither, MD   0.4 mg at 03/30/21 0841   traMADol (ULTRAM) tablet 50 mg  50 mg Oral Q6H PRN Leroy Sea, MD   50 mg at 03/30/21 1429   vitamin B-12 (CYANOCOBALAMIN) tablet 1,000 mcg  1,000 mcg Oral Daily Myrtie Neither, MD   1,000 mcg at 03/30/21 1610     Discharge Medications: Please see discharge summary for a list of discharge medications.  Relevant Imaging Results:  Relevant Lab Results:   Additional Information SS# [id number]  Allayne Butcher, RN

## 2021-03-30 NOTE — Progress Notes (Signed)
PROGRESS NOTE                                                                                                                                                                                                             Patient Demographics:    Mark [name], is a 60 y.o. male, DOB - [birth year], YBO:[id number]  Outpatient Primary MD for the patient is Marjie Skiff, NP    LOS - 2  Admit date - 03/27/2021    Chief Complaint  Patient presents with   Dizziness       Brief Narrative (HPI from H&P)    - Mark [name] is a 60 y.o. male with medical history significant for depression history of kidney stones hypertension history of stroke who presented to the emergency room with complaint of dizziness and feeling generally weak, had preceding history of diarrhea and some generalized abdominal pain for the last few days along with a mild cough which was productive of yellow phlegm.  In the ER he was diagnosed with COVID-19 infection, bacterial pneumonia, COVID-19 gastroenteritis with severe dehydration and hypotension and admitted to the hospital.   Subjective:   Seen in bed denies any headache, no chest pain, no shortness of breath, continues to have intermittent abdominal pain but states this is chronic, very hard to assess as his symptoms keep on changing.   Assessment  & Plan :       Acute incidental COVID 19 infection with CAP and COVID-19 gastroenteritis - he has evidence of bacterial pneumonia for which he is on IV antibiotics, he has been aggressively hydrated with IV fluids and his blood pressures and orthostatics are improving, still weak qualifies for SNF, will request case management to evaluate, continue to monitor closely his inflammatory markers and him clinically.  Encouraged the patient to sit up in chair in the daytime use I-S and flutter valve for pulmonary toiletry.  Will advance activity and titrate down  oxygen as possible.   SpO2: 97 %  Recent Labs  Lab 03/27/21 0837 03/27/21 1218 03/28/21 0837 03/29/21 0454 03/30/21 0608  WBC 12.9*  12.9*  --  12.2* 9.9 13.3*  HGB 15.6  15.6  --  13.7 13.8 15.0  HCT 44.4  43.8  --  38.2* 39.5 43.3  PLT 509*  476*  --  503* 570* 628*  BNP  --   --  81.6 182.2* 236.9*  PROCALCITON  --   --  6.62 3.27  --   AST 14*  --   --  12* 16  ALT 19  --   --  21 16  ALKPHOS 106  --   --  124 118  BILITOT 1.0  --   --  0.7 0.9  ALBUMIN 3.5  --   --  3.1* 3.4*  LATICACIDVEN  --  0.9  --   --   --     2.  Severe dehydration with orthostatic hypotension.  Hydrate with IV fluids, improved.  3.  Bowel resection.  Abdominal CT nonacute, chronic ureteric obstruction and hydronephrosis noted, no concerning symptoms will monitor closely.  Abdominal exam is benign for now.  He does have chronic intermittent abdominal pain and discomfort which is at his baseline, his symptoms are actually much improved.  Continue supportive care.  4.  Gastroenteritis.  Most likely COVID, ruled out C. difficile.  Gastroenteritis was present on admission.  With supportive care improving.   5.  Essential hypertension.  Blood pressure proved with hydration gently add back Norvasc and monitor, also on low-dose beta-blocker.  6.  Hypokalemia.  Replaced PO + IV.  Improved.  7. H/O CVA - supportive care, has had some cognitive decline since then.       Condition - Extremely Guarded  Family Communication  : Called  ex wife on [phone] - 03/30/21  Code Status :  Full  Consults  :  None  PUD Prophylaxis : PPI   Procedures  :     CT  - 1. Tree-in-bud pattern in the RIGHT lower lobe consistent with pulmonary bronchopneumonia. 2. Mild residual hydronephrosis on the RIGHT related to prior RIGHT obstructing mid ureteral calculus. No obstructing calculus now present within the RIGHT ureter. 3. No clear explanation for LEFT lower quadrant pain as reported.      Disposition  Plan  :    Status is: IP   Dispo: The patient is from: Home              Anticipated d/c is to: Home              Patient currently is not medically stable to d/c.   Difficult to place patient No  DVT Prophylaxis  :    enoxaparin (LOVENOX) injection 40 mg Start: 03/27/21 1515 Place TED hose Start: 03/27/21 1503    Lab Results  Component Value Date   PLT 628 (H) 03/30/2021    Diet :  Diet Order             DIET SOFT Room service appropriate? Yes; Fluid consistency: Thin  Diet effective now                    Inpatient Medications  Scheduled Meds:  amLODipine  5 mg Oral Daily   aspirin EC  81 mg Oral Daily   azithromycin  500 mg Oral Daily   citalopram  20 mg Oral Daily   enoxaparin (LOVENOX) injection  40 mg Subcutaneous Q24H   ferrous sulfate  325 mg Oral Q breakfast   melatonin  10 mg Oral QHS   [START ON 03/31/2021] methylPREDNISolone (SOLU-MEDROL) injection  40 mg Intravenous Daily   metoprolol tartrate  25 mg Oral BID   pantoprazole  40 mg Oral Daily   pravastatin  20 mg Oral Daily   sucralfate  1 g Oral  TID WC & HS   tamsulosin  0.4 mg Oral Daily   cyanocobalamin  1,000 mcg Oral Daily   Continuous Infusions:  cefTRIAXone (ROCEPHIN)  IV 1 g (03/29/21 1651)   promethazine (PHENERGAN) injection (IM or IVPB) Stopped (03/28/21 1459)   PRN Meds:.acetaminophen, albuterol, morphine injection, ondansetron, ondansetron (ZOFRAN) IV, promethazine (PHENERGAN) injection (IM or IVPB), traMADol  Antibiotics Given (last 72 hours)     Date/Time Action Medication Dose Rate   03/27/21 1248 New Bag/Given   levofloxacin (LEVAQUIN) IVPB 500 mg 500 mg 100 mL/hr   03/27/21 1728 New Bag/Given   cefTRIAXone (ROCEPHIN) 1 g in sodium chloride 0.9 % 100 mL IVPB 1 g 200 mL/hr   03/28/21 0818 Given   azithromycin (ZITHROMAX) tablet 500 mg 500 mg    03/28/21 1513 New Bag/Given   cefTRIAXone (ROCEPHIN) 1 g in sodium chloride 0.9 % 100 mL IVPB 1 g 200 mL/hr   03/29/21 0819 Given    azithromycin (ZITHROMAX) tablet 500 mg 500 mg    03/29/21 1651 New Bag/Given   cefTRIAXone (ROCEPHIN) 1 g in sodium chloride 0.9 % 100 mL IVPB 1 g 200 mL/hr   03/30/21 0841 Given   azithromycin (ZITHROMAX) tablet 500 mg 500 mg          Time Spent in minutes  30   Susa Raring M.D on 03/30/2021 at 9:50 AM  To page go to [email or web address]   [facility] -  Office  [phone]    See all Orders from today for further details    Objective:   Vitals:   03/29/21 2006 03/29/21 2346 03/30/21 0403 03/30/21 0822  BP: (!) 157/92 (!) 149/88 (!) 155/93 (!) 151/94  Pulse: 67 (!) 57 60 63  Resp: 18 16 18 17   Temp: 97.8 F (36.6 C) 98.3 F (36.8 C) 98.6 F (37 C) 98.7 F (37.1 C)  TempSrc:    Oral  SpO2: 99% 97% 97% 97%  Weight:      Height:        Wt Readings from Last 3 Encounters:  03/27/21 63.5 kg  03/22/21 65.8 kg  03/18/21 70.3 kg     Intake/Output Summary (Last 24 hours) at 03/30/2021 0950 Last data filed at 03/30/2021 04/01/2021 Gross per 24 hour  Intake --  Output 2200 ml  Net -2200 ml     Physical Exam  Awake Alert, No new F.N deficits, Normal affect [place].AT,PERRAL Supple Neck,No JVD, No cervical lymphadenopathy appriciated.  Symmetrical Chest wall movement, Good air movement bilaterally, CTAB RRR,No Gallops, Rubs or new Murmurs, No Parasternal Heave +ve B.Sounds, Abd Soft, No tenderness, No organomegaly appriciated, No rebound - guarding or rigidity. No Cyanosis, Clubbing or edema, No new Rash or bruise         Data Review:    CBC Recent Labs  Lab 03/27/21 0837 03/28/21 0837 03/29/21 0454 03/30/21 0608  WBC 12.9*  12.9* 12.2* 9.9 13.3*  HGB 15.6  15.6 13.7 13.8 15.0  HCT 44.4  43.8 38.2* 39.5 43.3  PLT 509*  476* 503* 570* 628*  MCV 90.4  90.1 91.4 88.2 89.6  MCH 31.8  32.1 32.8 30.8 31.1  MCHC 35.1  35.6 35.9 34.9 34.6  RDW 14.0  13.9 14.0 13.8 14.2  LYMPHSABS 2.0  --  1.5 1.6  MONOABS 1.7*  --  0.3 0.7  EOSABS 0.0  --  0.0 0.0   BASOSABS 0.0  --  0.0 0.0    Recent Labs  Lab 03/27/21 0837 03/27/21 1218 03/28/21  4098 03/29/21 0454 03/30/21 0608  NA 137  --  136 140 141  K 3.1*  --  2.9* 3.5 4.1  CL 105  --  105 108 104  CO2 21*  --  19* 23 29  GLUCOSE 114*  --  97 135* 125*  BUN 21*  --  CREATININE 0.91  --  0.90 0.61 0.91  CALCIUM 8.9  --  8.3* 8.6* 9.0  AST 14*  --   --  12* 16  ALT 19  --   --  21 16  ALKPHOS 106  --   --  124 118  BILITOT 1.0  --   --  0.7 0.9  ALBUMIN 3.5  --   --  3.1* 3.4*  MG  --   --  2.0 2.1 2.1  PROCALCITON  --   --  6.62 3.27  --   LATICACIDVEN  --  0.9  --   --   --   BNP  --   --  81.6 182.2* 236.9*    ------------------------------------------------------------------------------------------------------------------ No results for input(s): CHOL, HDL, LDLCALC, TRIG, CHOLHDL, LDLDIRECT in the last 72 hours.  Lab Results  Component Value Date   HGBA1C 5.6 06/23/2020   ------------------------------------------------------------------------------------------------------------------ No results for input(s): TSH, T4TOTAL, T3FREE, THYROIDAB in the last 72 hours.  Invalid input(s): FREET3  Cardiac Enzymes No results for input(s): CKMB, TROPONINI, MYOGLOBIN in the last 168 hours.  Invalid input(s): CK ------------------------------------------------------------------------------------------------------------------    Component Value Date/Time   BNP 236.9 (H) 03/30/2021 0608      Radiology Reports DG Chest 2 View  Result Date: 03/27/2021 CLINICAL DATA:  Cough. EXAM: CHEST - 2 VIEW COMPARISON:  September 12, 2011 FINDINGS: No pneumothorax. The cardiomediastinal silhouette is stable. Bibasilar pulmonary infiltrates, right greater than left identified. No other acute abnormalities. IMPRESSION: Bibasilar infiltrates consistent with developing pneumonia or aspiration. Recommend follow-up to resolution. No other changes. Electronically Signed   By: Gerome Sam  III M.D.   On: 03/27/2021 10:00   DG Abd 1 View  Result Date: 03/28/2021 CLINICAL DATA:  Nausea and abdominal pain EXAM: ABDOMEN - 1 VIEW COMPARISON:  CT from the previous day. FINDINGS: Scattered large and small bowel gas is noted. Postsurgical changes are noted on the right. Multiple ingested tablets are seen. No bony abnormality is noted. IMPRESSION: No acute abnormality noted. Electronically Signed   By: Alcide Clever M.D.   On: 03/28/2021 17:13   CT ABDOMEN PELVIS W CONTRAST  Result Date: 03/27/2021 CLINICAL DATA:  Dizziness and weakness.  Positive COVID. EXAM: CT ABDOMEN AND PELVIS WITH CONTRAST TECHNIQUE: Multidetector CT imaging of the abdomen and pelvis was performed using the standard protocol following bolus administration of intravenous contrast. CONTRAST:  75mL OMNIPAQUE IOHEXOL 350 MG/ML SOLN COMPARISON:  None. FINDINGS: Lower chest: Branching nodular pattern in the RIGHT lower lobe (so-called tree in bud) consistent with RIGHT lower lobe pulmonary infection. No focal consolidation. Hepatobiliary: Benign vascular enhancement along the anterior RIGHT hepatic lobe (image 10/2). No worrisome hepatic lesion. Postcholecystectomy Pancreas: Pancreas is normal. No ductal dilatation. No pancreatic inflammation. Spleen: Spleen is irregular lobular contour. Adrenals/urinary tract: Adrenal glands normal. There is mild persistent hydronephrosis and proximal hydroureter on the RIGHT which is improved from prior. Interval clearing of the mid RIGHT ureteral calculus seen on comparison CT exam. No ureteral calculi remain. No bladder calculi. LEFT kidney and ureter normal. Stomach/Bowel: Stomach and small bowel normal. Colon and rectosigmoid colon normal. Vascular/Lymphatic: Abdominal aorta is normal  caliber with atherosclerotic calcification. There is no retroperitoneal or periportal lymphadenopathy. No pelvic lymphadenopathy. Reproductive: Prostate unremarkable. Other: No free fluid. Musculoskeletal: No  aggressive osseous lesion. IMPRESSION: 1. Tree-in-bud pattern in the RIGHT lower lobe consistent with pulmonary bronchopneumonia. 2. Mild residual hydronephrosis on the RIGHT related to prior RIGHT obstructing mid ureteral calculus. No obstructing calculus now present within the RIGHT ureter. 3. No clear explanation for LEFT lower quadrant pain as reported. Electronically Signed   By: Genevive Bi M.D.   On: 03/27/2021 11:46

## 2021-03-30 NOTE — Progress Notes (Addendum)
Occupational Therapy Treatment Patient Details Name: Mark [name] MRN: [id number] DOB: 11/26/1960 Today's Date: 03/30/2021    History of present illness Pt admitted for dizziness with complaints of weakness and dizziness. History includes depression, HTN, and old CVA.   OT comments  Pt seen for OT treatment on this date. Upon arrival to room, pt awake and seated upright in bed. Pt agreeable to OT tx. Pt continues to present with dizziness, decreased activity tolerance, and decreased balance. This date, pt was able to complete sit<>stand transfers with SUPERVISION, standing grooming tasks with SUPERVISION, and functional mobility of short household distances (~51ft) with SUPERVISION. Of note, after pt walked ~35ft, pt stated "I feel like I might pass out". Pt was able to walk back to bed, reporting symptoms subsiding once supine. RN informed. Recommend continuing to monitor orthostatic vitals in subsequent OOB mobility attempts. At end of session, this author discussed d/c recommendations, with pt stating he does not want to go to SNF. If pt discharges home, recommend 24/7 supervision/assistance from son (who lives in [place], not [place] as previously recorded). Will continue to follow POC. Discharge recommendation remains appropriate.     Follow Up Recommendations  SNF;Other (comment) (unless able to obtain 24/7 assistance from family)    Equipment Recommendations  3 in 1 bedside commode       Precautions / Restrictions Precautions Precautions: Fall Restrictions Weight Bearing Restrictions: No       Mobility Bed Mobility Overal bed mobility: Modified Independent             General bed mobility comments: able to come to sitting safely    Transfers Overall transfer level: Needs assistance   Transfers: Sit to/from Stand Sit to Stand: Supervision              Balance Overall balance assessment: Needs assistance Sitting-balance support: Feet supported;No upper extremity  supported Sitting balance-Leahy Scale: Good Sitting balance - Comments: Good sitting balance while sitting EOB   Standing balance support: Single extremity supported;During functional activity Standing balance-Leahy Scale: Fair Standing balance comment: With unilateral UE support from countertop, pt able to engage in standing grooming & hygiene tasks                           ADL either performed or assessed with clinical judgement   ADL Overall ADL's : Needs assistance/impaired     Grooming: Wash/dry face;Oral care;Supervision/safety;Set up;Standing                               Functional mobility during ADLs: Supervision/safety        Cognition Arousal/Alertness: Awake/alert Behavior During Therapy: WFL for tasks assessed/performed Overall Cognitive Status: No family/caregiver present to determine baseline cognitive functioning                                 General Comments: Pt agreeable to session, but providing inconsistent reports regarding symptoms with OOB mobility                   Pertinent Vitals/ Pain       Pain Assessment: Faces Faces Pain Scale: Hurts a little bit Pain Location: abdomen Pain Intervention(s): Limited activity within patient's tolerance;Monitored during session         Frequency  Min 2X/week        Progress  Toward Goals  OT Goals(current goals can now be found in the care plan section)  Progress towards OT goals: Progressing toward goals  Acute Rehab OT Goals Patient Stated Goal: to feel better OT Goal Formulation: With patient Time For Goal Achievement: 04/12/21 Potential to Achieve Goals: Good  Plan Discharge plan remains appropriate;Frequency remains appropriate       AM-PAC OT "6 Clicks" Daily Activity     Outcome Measure   Help from another person eating meals?: None Help from another person taking care of personal grooming?: A Little Help from another person toileting,  which includes using toliet, bedpan, or urinal?: A Little Help from another person bathing (including washing, rinsing, drying)?: A Lot Help from another person to put on and taking off regular upper body clothing?: A Little Help from another person to put on and taking off regular lower body clothing?: A Little 6 Click Score: 18    End of Session    OT Visit Diagnosis: Muscle weakness (generalized) (M62.81);Unsteadiness on feet (R26.81)   Activity Tolerance Patient tolerated treatment well   Patient Left in bed;with call bell/phone within reach;with bed alarm set   Nurse Communication Mobility status;Other (comment) (dizziness)        Time: 6294-7654 OT Time Calculation (min): 12 min  Charges: OT General Charges $OT Visit: 1 Visit OT Treatments $Self Care/Home Management : 8-22 mins  Matthew Folks, OTR/L [facility] [phone]

## 2021-03-30 NOTE — Plan of Care (Signed)
  Problem: Clinical Measurements: Goal: Respiratory complications will improve Outcome: Progressing   Problem: Clinical Measurements: Goal: Cardiovascular complication will be avoided Outcome: Progressing   Problem: Elimination: Goal: Will not experience complications related to bowel motility Outcome: Progressing   Problem: Elimination: Goal: Will not experience complications related to urinary retention Outcome: Progressing   Problem: Pain Managment: Goal: General experience of comfort will improve Outcome: Progressing   Problem: Safety: Goal: Ability to remain free from injury will improve Outcome: Progressing   

## 2021-03-30 NOTE — TOC Initial Note (Signed)
Transition of Care [facility]) - Initial/Assessment Note    Patient Details  Name: Mark [name] MRN: [id number] Date of Birth: 1961-03-01  Transition of Care [facility]) CM/SW Contact:    Allayne Butcher, RN Phone Number: 03/30/2021, 3:37 PM  Clinical Narrative:                 Patient admitted to the hospital with COVID pneumonia.  Patient has history of stroke.  Patient tested positive for COVID on 8/9.  RNCM was able to speak with patient's x wife via phone, she will reach out to the patient's son.  Patient lives alone and requires no assistive devices.  Patient had a stroke in the past and went to [facility] for short term rehab.  X wife and son are interested in rehab again as patient does live alone and they can check in on him but cannot be there 24/7.   RNCM will start SNF workup.  Expected Discharge Plan: Skilled Nursing Facility Barriers to Discharge: Continued Medical Work up   Patient Goals and CMS Choice Patient states their goals for this hospitalization and ongoing recovery are:: Patient's family would like for patient to get short term rehab CMS Medicare.gov Compare Post Acute Care list provided to:: Patient Represenative (must comment) Choice offered to / list presented to : Adult Children  Expected Discharge Plan and Services Expected Discharge Plan: Skilled Nursing Facility   Discharge Planning Services: CM Consult Post Acute Care Choice: Skilled Nursing Facility Living arrangements for the past 2 months: Single Family Home Expected Discharge Date: 03/30/21               DME Arranged: N/A DME Agency: NA       HH Arranged: NA HH Agency: NA        Prior Living Arrangements/Services Living arrangements for the past 2 months: Single Family Home Lives with:: Self Patient language and need for interpreter reviewed:: Yes Do you feel safe going back to the place where you live?: Yes      Need for Family Participation in Patient Care: Yes  (Comment) Care giver support system in place?: Yes (comment)   Criminal Activity/Legal Involvement Pertinent to Current Situation/Hospitalization: No - Comment as needed  Activities of Daily Living      Permission Sought/Granted Permission sought to share information with : Case Manager, Family Supports, Magazine features editor Permission granted to share information with : Yes, Verbal Permission Granted  Share Information with NAME: Tyler Aas and Retail buyer  Permission granted to share info w AGENCY: SNF's  Permission granted to share info w Relationship: x wife and son     Emotional Assessment       Orientation: : Oriented to Self, Oriented to Place, Oriented to Situation Alcohol / Substance Use: Not Applicable Psych Involvement: No (comment)  Admission diagnosis:  Dizziness and giddiness [R42] Dizziness [R42] Community acquired pneumonia of right lower lobe of lung [J18.9] Left lower quadrant abdominal pain [R10.32] Patient Active Problem List   Diagnosis Date Noted   Dizziness and giddiness 03/27/2021   Grief reaction 11/15/2020   Dieulafoy lesion of stomach    Aortic atherosclerosis (HCC) 05/13/2020   Back pain 01/02/2020   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   History of kidney stones 05/09/2019   Pulmonary nodule 05/09/2019   Paraseptal emphysema (HCC) 05/09/2019   Nicotine dependence, cigarettes, w unsp disorders 04/30/2019   Hyperlipidemia LDL goal <70 09/05/2018   Elevated hemoglobin A1c 09/03/2018   Cognitive impairment  12/15/2016   History of opioid abuse (HCC) 12/30/2015   History of intestinal obstruction 05/03/2015   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   Anxiety and depression 05/03/2015   Hypertension 05/03/2015   Aphasia with stroke 05/03/2015   History of CVA (cerebrovascular accident) 05/03/2015   PCP:  Marjie Skiff, NP Pharmacy:   [facility] [phone] - [place], [place] - [address] [place] [address] Phone: [phone] Fax: [phone]     Social Determinants of Health (SDOH) Interventions    Readmission Risk Interventions No flowsheet data found.

## 2021-03-31 ENCOUNTER — Inpatient Hospital Stay: Payer: Medicare HMO

## 2021-03-31 ENCOUNTER — Ambulatory Visit: Payer: Medicare HMO

## 2021-03-31 ENCOUNTER — Ambulatory Visit: Payer: Medicare HMO | Admitting: Physical Therapy

## 2021-03-31 DIAGNOSIS — R42 Dizziness and giddiness: Secondary | ICD-10-CM | POA: Diagnosis not present

## 2021-03-31 DIAGNOSIS — J189 Pneumonia, unspecified organism: Secondary | ICD-10-CM | POA: Diagnosis not present

## 2021-03-31 LAB — COMPREHENSIVE METABOLIC PANEL
ALT: 15 U/L (ref 0–44)
AST: 14 U/L — ABNORMAL LOW (ref 15–41)
Albumin: 3.7 g/dL (ref 3.5–5.0)
Alkaline Phosphatase: 103 U/L (ref 38–126)
Anion gap: 9 (ref 5–15)
BUN: 13 mg/dL (ref 6–20)
CO2: 32 mmol/L (ref 22–32)
Calcium: 9 mg/dL (ref 8.9–10.3)
Chloride: 102 mmol/L (ref 98–111)
Creatinine, Ser: 0.86 mg/dL (ref 0.61–1.24)
GFR, Estimated: >60 mL/min (ref >60)
Glucose, Bld: 91 mg/dL (ref 70–99)
Potassium: 3.7 mmol/L (ref 3.5–5.1)
Sodium: 143 mmol/L (ref 135–145)
Total Bilirubin: 0.7 mg/dL (ref 0.3–1.2)
Total Protein: 6.7 g/dL (ref 6.5–8.1)

## 2021-03-31 LAB — CBC WITH DIFFERENTIAL/PLATELET
Abs Immature Granulocytes: 0.06 10*3/uL (ref 0.00–0.07)
Basophils Absolute: 0 10*3/uL (ref 0.0–0.1)
Basophils Relative: 0 %
Eosinophils Absolute: 0 10*3/uL (ref 0.0–0.5)
Eosinophils Relative: 0 %
HCT: 43.6 % (ref 39.0–52.0)
Hemoglobin: 15 g/dL (ref 13.0–17.0)
Immature Granulocytes: 1 %
Lymphocytes Relative: 27 %
Lymphs Abs: 3.3 10*3/uL (ref 0.7–4.0)
MCH: 31.7 pg (ref 26.0–34.0)
MCHC: 34.4 g/dL (ref 30.0–36.0)
MCV: 92.2 fL (ref 80.0–100.0)
Monocytes Absolute: 1.4 10*3/uL — ABNORMAL HIGH (ref 0.1–1.0)
Monocytes Relative: 12 %
Neutro Abs: 7.3 10*3/uL (ref 1.7–7.7)
Neutrophils Relative %: 60 %
Platelets: 585 10*3/uL — ABNORMAL HIGH (ref 150–400)
RBC: 4.73 MIL/uL (ref 4.22–5.81)
RDW: 13.8 % (ref 11.5–15.5)
WBC: 12 10*3/uL — ABNORMAL HIGH (ref 4.0–10.5)
nRBC: 0 % (ref 0.0–0.2)

## 2021-03-31 LAB — MAGNESIUM: Magnesium: 2.2 mg/dL (ref 1.7–2.4)

## 2021-03-31 LAB — BRAIN NATRIURETIC PEPTIDE: B Natriuretic Peptide: 57.6 pg/mL (ref 0.0–100.0)

## 2021-03-31 LAB — PROCALCITONIN: Procalcitonin: 0.59 ng/mL

## 2021-03-31 MED ORDER — METOPROLOL TARTRATE 50 MG PO TABS
50.0000 mg | ORAL_TABLET | Freq: Two times a day (BID) | ORAL | Status: DC
  Administered 2021-03-31 – 2021-04-04 (×8): 50 mg via ORAL
  Filled 2021-03-31 (×8): qty 1

## 2021-03-31 MED ORDER — METOPROLOL TARTRATE 25 MG PO TABS
25.0000 mg | ORAL_TABLET | Freq: Once | ORAL | Status: AC
  Administered 2021-03-31: 12:00:00 25 mg via ORAL
  Filled 2021-03-31: qty 1

## 2021-03-31 NOTE — Progress Notes (Signed)
PROGRESS NOTE                                                                                                                                                                                                             Patient Demographics:    Mark [name], is a 60 y.o. male, DOB - [birth year], VOH:[id number]  Outpatient Primary MD for the patient is Marjie Skiff, NP    LOS - 3  Admit date - 03/27/2021    Chief Complaint  Patient presents with   Dizziness       Brief Narrative (HPI from H&P)    - Mark [name] is a 60 y.o. male with medical history significant for depression history of kidney stones hypertension history of stroke who presented to the emergency room with complaint of dizziness and feeling generally weak, had preceding history of diarrhea and some generalized abdominal pain for the last few days along with a mild cough which was productive of yellow phlegm.  In the ER he was diagnosed with COVID-19 infection, bacterial pneumonia, COVID-19 gastroenteritis with severe dehydration and hypotension and admitted to the hospital.   Subjective:   Patient in bed, appears comfortable, denies any headache, no fever, no chest pain or pressure, no shortness of breath , chronic intermittent abdominal pain. No new focal weakness.    Assessment  & Plan :    Acute incidental COVID 19 infection with CAP and COVID-19 gastroenteritis - he has evidence of bacterial pneumonia for which he is on IV antibiotics, he has been aggressively hydrated with IV fluids and his blood pressures and orthostatics are improving, still weak qualifies for SNF, will request case management to evaluate, continue to monitor closely his inflammatory markers and him clinically.  Encouraged the patient to sit up in chair in the daytime use I-S and flutter valve for pulmonary toiletry.  Will advance activity and titrate down oxygen as  possible.   SpO2: 97 %  Recent Labs  Lab 03/27/21 0837 03/27/21 1218 03/28/21 0837 03/29/21 0454 03/30/21 0608 03/31/21 0537  WBC 12.9*  12.9*  --  12.2* 9.9 13.3* 12.0*  HGB 15.6  15.6  --  13.7 13.8 15.0 15.0  HCT 44.4  43.8  --  38.2* 39.5 43.3 43.6  PLT 509*  476*  --  503* 570* 628* 585*  BNP  --   --  81.6 182.2* 236.9* 57.6  PROCALCITON  --   --  6.62 3.27 1.31 0.59  AST 14*  --   --  12* 16 14*  ALT 19  --   --  21 16 15   ALKPHOS 106  --   --  124 118 103  BILITOT 1.0  --   --  0.7 0.9 0.7  ALBUMIN 3.5  --   --  3.1* 3.4* 3.7  LATICACIDVEN  --  0.9  --   --   --   --     2.  Severe dehydration with orthostatic hypotension.  Hydrate with IV fluids, improved.  3.  Bowel resection.  Abdominal CT nonacute, chronic ureteric obstruction and hydronephrosis noted, no concerning symptoms will monitor closely.  Abdominal exam is benign for now.  He does have chronic intermittent abdominal pain and discomfort which is at his baseline, his symptoms are actually much improved.  Continue supportive care.  4.  Gastroenteritis.  Most likely COVID, ruled out C. difficile.  Gastroenteritis was present on admission.  With supportive care improving.   5.  Essential hypertension.  On Norvasc, Lopressor adjusted.  6.  Hypokalemia.  Replaced PO + IV.  Improved.  7. H/O CVA - supportive care, has had some cognitive decline since then.       Condition - Extremely Guarded  Family Communication  : Called  ex wife on [phone] - 03/30/21  Code Status :  Full  Consults  :  None  PUD Prophylaxis : PPI   Procedures  :     CT  - 1. Tree-in-bud pattern in the RIGHT lower lobe consistent with pulmonary bronchopneumonia. 2. Mild residual hydronephrosis on the RIGHT related to prior RIGHT obstructing mid ureteral calculus. No obstructing calculus now present within the RIGHT ureter. 3. No clear explanation for LEFT lower quadrant pain as reported.      Disposition Plan  :     Status is: IP   Dispo: The patient is from: Home              Anticipated d/c is to: Home              Patient currently is not medically stable to d/c.   Difficult to place patient No  DVT Prophylaxis  :    enoxaparin (LOVENOX) injection 40 mg Start: 03/27/21 1515 Place TED hose Start: 03/27/21 1503    Lab Results  Component Value Date   PLT 585 (H) 03/31/2021    Diet :  Diet Order             DIET SOFT Room service appropriate? Yes; Fluid consistency: Thin  Diet effective now                    Inpatient Medications  Scheduled Meds:  amLODipine  5 mg Oral Daily   aspirin EC  81 mg Oral Daily   azithromycin  500 mg Oral Daily   citalopram  20 mg Oral Daily   enoxaparin (LOVENOX) injection  40 mg Subcutaneous Q24H   ferrous sulfate  325 mg Oral Q breakfast   melatonin  10 mg Oral QHS   metoprolol tartrate  25 mg Oral Once   metoprolol tartrate  50 mg Oral BID   pantoprazole  40 mg Oral Daily   pravastatin  20 mg Oral Daily   sucralfate  1 g Oral TID WC & HS   tamsulosin  0.4 mg Oral Daily  cyanocobalamin  1,000 mcg Oral Daily   Continuous Infusions:  cefTRIAXone (ROCEPHIN)  IV 1 g (03/30/21 1603)   promethazine (PHENERGAN) injection (IM or IVPB) Stopped (03/31/21 0425)   PRN Meds:.acetaminophen, albuterol, loperamide, morphine injection, ondansetron, ondansetron (ZOFRAN) IV, promethazine (PHENERGAN) injection (IM or IVPB), traMADol  Antibiotics Given (last 72 hours)     Date/Time Action Medication Dose Rate   03/28/21 1513 New Bag/Given   cefTRIAXone (ROCEPHIN) 1 g in sodium chloride 0.9 % 100 mL IVPB 1 g 200 mL/hr   03/29/21 0819 Given   azithromycin (ZITHROMAX) tablet 500 mg 500 mg    03/29/21 1651 New Bag/Given   cefTRIAXone (ROCEPHIN) 1 g in sodium chloride 0.9 % 100 mL IVPB 1 g 200 mL/hr   03/30/21 0841 Given   azithromycin (ZITHROMAX) tablet 500 mg 500 mg    03/30/21 1603 New Bag/Given   cefTRIAXone (ROCEPHIN) 1 g in sodium chloride 0.9 %  100 mL IVPB 1 g 200 mL/hr   03/31/21 0829 Given   azithromycin (ZITHROMAX) tablet 500 mg 500 mg          Time Spent in minutes  30   Susa Raring M.D on 03/31/2021 at 10:46 AM  To page go to [email or web address]   [facility] -  Office  [phone]    See all Orders from today for further details    Objective:   Vitals:   03/30/21 2055 03/31/21 0330 03/31/21 0516 03/31/21 0812  BP: (!) 155/98 (!) 149/101 132/89 (!) 141/101  Pulse: 70 (!) 58  78  Resp: 18 18  14   Temp: 98.2 F (36.8 C) 98 F (36.7 C)  98.4 F (36.9 C)  TempSrc:  Oral    SpO2: 95% 97%  97%  Weight:      Height:        Wt Readings from Last 3 Encounters:  03/27/21 63.5 kg  03/22/21 65.8 kg  03/18/21 70.3 kg     Intake/Output Summary (Last 24 hours) at 03/31/2021 1046 Last data filed at 03/31/2021 0517 Gross per 24 hour  Intake 290 ml  Output 1570 ml  Net -1280 ml     Physical Exam  Awake Alert, No new F.N deficits,   [place].AT,PERRAL Supple Neck,No JVD, No cervical lymphadenopathy appriciated.  Symmetrical Chest wall movement, Good air movement bilaterally, CTAB RRR,No Gallops, Rubs or new Murmurs, No Parasternal Heave +ve B.Sounds, Abd Soft, No tenderness, No organomegaly appriciated, No rebound - guarding or rigidity. No Cyanosis, Clubbing or edema, No new Rash or bruise        Data Review:    CBC Recent Labs  Lab 03/27/21 0837 03/28/21 0837 03/29/21 0454 03/30/21 0608 03/31/21 0537  WBC 12.9*  12.9* 12.2* 9.9 13.3* 12.0*  HGB 15.6  15.6 13.7 13.8 15.0 15.0  HCT 44.4  43.8 38.2* 39.5 43.3 43.6  PLT 509*  476* 503* 570* 628* 585*  MCV 90.4  90.1 91.4 88.2 89.6 92.2  MCH 31.8  32.1 32.8 30.8 31.1 31.7  MCHC 35.1  35.6 35.9 34.9 34.6 34.4  RDW 14.0  13.9 14.0 13.8 14.2 13.8  LYMPHSABS 2.0  --  1.5 1.6 3.3  MONOABS 1.7*  --  0.3 0.7 1.4*  EOSABS 0.0  --  0.0 0.0 0.0  BASOSABS 0.0  --  0.0 0.0 0.0    Recent Labs  Lab 03/27/21 0837 03/27/21 1218 03/28/21 0837  03/29/21 0454 03/30/21 0608 03/31/21 0537  NA 137  --  136 140 141 143  K 3.1*  --  2.9* 3.5 4.1 3.7  CL 105  --  105 108 104 102  CO2 21*  --  19* 23 29 32  GLUCOSE 114*  --  97 135* 125* 91  BUN 21*  --  13 8 8 13   CREATININE 0.91  --  0.90 0.61 0.91 0.86  CALCIUM 8.9  --  8.3* 8.6* 9.0 9.0  AST 14*  --   --  12* 16 14*  ALT 19  --   --  21 16 15   ALKPHOS 106  --   --  124 118 103  BILITOT 1.0  --   --  0.7 0.9 0.7  ALBUMIN 3.5  --   --  3.1* 3.4* 3.7  MG  --   --  2.0 2.1 2.1 2.2  PROCALCITON  --   --  6.62 3.27 1.31 0.59  LATICACIDVEN  --  0.9  --   --   --   --   BNP  --   --  81.6 182.2* 236.9* 57.6    ------------------------------------------------------------------------------------------------------------------ No results for input(s): CHOL, HDL, LDLCALC, TRIG, CHOLHDL, LDLDIRECT in the last 72 hours.  Lab Results  Component Value Date   HGBA1C 5.6 06/23/2020   ------------------------------------------------------------------------------------------------------------------ No results for input(s): TSH, T4TOTAL, T3FREE, THYROIDAB in the last 72 hours.  Invalid input(s): FREET3  Cardiac Enzymes No results for input(s): CKMB, TROPONINI, MYOGLOBIN in the last 168 hours.  Invalid input(s): CK ------------------------------------------------------------------------------------------------------------------    Component Value Date/Time   BNP 57.6 03/31/2021 0537      Radiology Reports DG Chest 2 View  Result Date: 03/27/2021 CLINICAL DATA:  Cough. EXAM: CHEST - 2 VIEW COMPARISON:  September 12, 2011 FINDINGS: No pneumothorax. The cardiomediastinal silhouette is stable. Bibasilar pulmonary infiltrates, right greater than left identified. No other acute abnormalities. IMPRESSION: Bibasilar infiltrates consistent with developing pneumonia or aspiration. Recommend follow-up to resolution. No other changes. Electronically Signed   By: Gerome Samavid  Williams III M.D.   On:  03/27/2021 10:00   DG Abd 1 View  Result Date: 03/28/2021 CLINICAL DATA:  Nausea and abdominal pain EXAM: ABDOMEN - 1 VIEW COMPARISON:  CT from the previous day. FINDINGS: Scattered large and small bowel gas is noted. Postsurgical changes are noted on the right. Multiple ingested tablets are seen. No bony abnormality is noted. IMPRESSION: No acute abnormality noted. Electronically Signed   By: Alcide CleverMark  Lukens M.D.   On: 03/28/2021 17:13   CT ABDOMEN PELVIS W CONTRAST  Result Date: 03/27/2021 CLINICAL DATA:  Dizziness and weakness.  Positive COVID. EXAM: CT ABDOMEN AND PELVIS WITH CONTRAST TECHNIQUE: Multidetector CT imaging of the abdomen and pelvis was performed using the standard protocol following bolus administration of intravenous contrast. CONTRAST:  75mL OMNIPAQUE IOHEXOL 350 MG/ML SOLN COMPARISON:  None. FINDINGS: Lower chest: Branching nodular pattern in the RIGHT lower lobe (so-called tree in bud) consistent with RIGHT lower lobe pulmonary infection. No focal consolidation. Hepatobiliary: Benign vascular enhancement along the anterior RIGHT hepatic lobe (image 10/2). No worrisome hepatic lesion. Postcholecystectomy Pancreas: Pancreas is normal. No ductal dilatation. No pancreatic inflammation. Spleen: Spleen is irregular lobular contour. Adrenals/urinary tract: Adrenal glands normal. There is mild persistent hydronephrosis and proximal hydroureter on the RIGHT which is improved from prior. Interval clearing of the mid RIGHT ureteral calculus seen on comparison CT exam. No ureteral calculi remain. No bladder calculi. LEFT kidney and ureter normal. Stomach/Bowel: Stomach and small bowel normal. Colon and rectosigmoid colon normal. Vascular/Lymphatic: Abdominal aorta is normal caliber with atherosclerotic calcification. There  is no retroperitoneal or periportal lymphadenopathy. No pelvic lymphadenopathy. Reproductive: Prostate unremarkable. Other: No free fluid. Musculoskeletal: No aggressive osseous  lesion. IMPRESSION: 1. Tree-in-bud pattern in the RIGHT lower lobe consistent with pulmonary bronchopneumonia. 2. Mild residual hydronephrosis on the RIGHT related to prior RIGHT obstructing mid ureteral calculus. No obstructing calculus now present within the RIGHT ureter. 3. No clear explanation for LEFT lower quadrant pain as reported. Electronically Signed   By: Genevive Bi M.D.   On: 03/27/2021 11:46   DG Chest Port 1 View  Result Date: 03/31/2021 CLINICAL DATA:  SOB. Hx of recent COVID. EXAM: PORTABLE CHEST - 1 VIEW COMPARISON:  Radiograph 03/27/2021, CT 05/11/2020 FINDINGS: Persistent coarse airspace opacities at the right lung base, and to a lesser degree in the left infrahilar region. Lungs otherwise clear. Heart size and mediastinal contours are within normal limits. No effusion.  No pneumothorax. Visualized bones unremarkable. IMPRESSION: Stable left infrahilar and right basilar scattered airspace opacities Electronically Signed   By: Corlis Leak M.D.   On: 03/31/2021 08:53   DG Abd Portable 1V  Result Date: 03/31/2021 CLINICAL DATA:  Nausea vomiting. EXAM: PORTABLE ABDOMEN - 1 VIEW COMPARISON:  03/30/2021 FINDINGS: No gaseous small bowel dilatation. Diffuse gaseous distention of colon again noted, similar to prior. Numerous surgical clips noted in the abdomen, right greater than left with right lower abdominal staple line evident. Visualized bony anatomy unremarkable. IMPRESSION: Stable exam. Mild diffuse gaseous distention of colon. No findings to suggest bowel obstruction. Electronically Signed   By: Kennith Center M.D.   On: 03/31/2021 08:52   DG Abd Portable 1V  Result Date: 03/30/2021 CLINICAL DATA:  Abdominal pain.  COVID. EXAM: PORTABLE ABDOMEN - 1 VIEW COMPARISON:  03/28/2021 FINDINGS: The bowel gas pattern is normal. No dilated loops of small bowel. Gas noted within normal caliber colon. Numerous surgical clips noted within both sides of abdomen. Suture chain identified within the  right lower quadrant. No radio-opaque calculi or other significant radiographic abnormality are seen. IMPRESSION: Nonobstructive bowel gas pattern. Electronically Signed   By: Signa Kell M.D.   On: 03/30/2021 12:49

## 2021-03-31 NOTE — Progress Notes (Signed)
Physical Therapy Treatment Patient Details Name: Mark [name] MRN: [id number] DOB: 01-31-1961 Today's Date: 03/31/2021    History of Present Illness Pt admitted for dizziness with complaints of weakness and dizziness. History includes depression, HTN, and old CVA.    PT Comments    Pt is making good progress towards goals with dizziness resolved. Demonstrated improved and safe technique for mobility around room including balance challenge. Reports no dizziness and feels he will be able to manage at home. Will update recs- care team notified. Will continue to progress as able.   Follow Up Recommendations  Home health PT     Equipment Recommendations  None recommended by PT    Recommendations for Other Services       Precautions / Restrictions Precautions Precautions: Fall Restrictions Weight Bearing Restrictions: No    Mobility  Bed Mobility Overal bed mobility: Modified Independent             General bed mobility comments: able to come to sitting safely    Transfers Overall transfer level: Modified independent Equipment used: None Transfers: Sit to/from Stand Sit to Stand: Modified independent (Device/Increase time)         General transfer comment: safe technique with quick movement. Reports no dizziness at this time  Ambulation/Gait Ambulation/Gait assistance: Supervision Gait Distance (Feet): 40 Feet Assistive device: None Gait Pattern/deviations: WFL(Within Functional Limits)     General Gait Details: safe technique with ambulation with ability to make turns without LOB. further distance limited due to time constraints   Stairs             Wheelchair Mobility    Modified Rankin (Stroke Patients Only)       Balance Overall balance assessment: Modified Independent                                          Cognition Arousal/Alertness: Awake/alert Behavior During Therapy: WFL for tasks assessed/performed Overall  Cognitive Status: Within Functional Limits for tasks assessed                                        Exercises Other Exercises Other Exercises: able to stand at window in room and perform balance assessment with safe technique.    General Comments        Pertinent Vitals/Pain Pain Assessment: No/denies pain    Home Living                      Prior Function            PT Goals (current goals can now be found in the care plan section) Acute Rehab PT Goals Patient Stated Goal: to feel better PT Goal Formulation: With patient Time For Goal Achievement: 04/12/21 Potential to Achieve Goals: Good Progress towards PT goals: Progressing toward goals    Frequency    Min 2X/week      PT Plan Discharge plan needs to be updated    Co-evaluation              AM-PAC PT "6 Clicks" Mobility   Outcome Measure  Help needed turning from your back to your side while in a flat bed without using bedrails?: None Help needed moving from lying on your back to sitting on the side of  a flat bed without using bedrails?: None Help needed moving to and from a bed to a chair (including a wheelchair)?: None Help needed standing up from a chair using your arms (e.g., wheelchair or bedside chair)?: None Help needed to walk in hospital room?: None Help needed climbing 3-5 steps with a railing? : None 6 Click Score: 24    End of Session   Activity Tolerance: Patient tolerated treatment well Patient left: in chair;with chair alarm set Nurse Communication: Mobility status PT Visit Diagnosis: Unsteadiness on feet (R26.81);Muscle weakness (generalized) (M62.81);Difficulty in walking, not elsewhere classified (R26.2);Dizziness and giddiness (R42)     Time: 0737-1062 PT Time Calculation (min) (ACUTE ONLY): 10 min  Charges:  $Gait Training: 8-22 mins                     Elizabeth Palau, [place], DPT [phone]    Mark [name] 03/31/2021, 3:39 PM

## 2021-03-31 NOTE — Progress Notes (Signed)
Occupational Therapy Treatment Patient Details Name: Mark [name] MRN: [id number] DOB: [birth year] Today's Date: 03/31/2021    History of present illness Pt admitted for dizziness with complaints of weakness and dizziness. History includes depression, HTN, and old CVA.   OT comments  Pt seen for OT tx this date to f/u re: safety with ADLs/ADL mobility. Pt c/o nausea and requires increased encouragement to participate in OT session. Pt is noted to come to EOB sitting with MOD I only and demo G static sitting balance. OT engages pt in hand hygiene in prep for dinner with SETUP in sitting. Pt able to CTS from EOB sitting with no AD with initially SUPV, but essentially no cues required and no physical assistance from therapist; ultimately MOD I. Pt does still require some safety cueing, but demos improved stability and has no c/o dizziness with positional changes this date. Will continue to follow acutely. Updated d/c rec to [facility] to reflect progress towards goals.    Follow Up Recommendations  Home health OT;Supervision - Intermittent    Equipment Recommendations  3 in 1 bedside commode;Tub/shower seat    Recommendations for Other Services      Precautions / Restrictions Precautions Precautions: Fall Restrictions Weight Bearing Restrictions: No       Mobility Bed Mobility Overal bed mobility: Modified Independent             General bed mobility comments: increased time/effort, no c/o dizziness this date    Transfers Overall transfer level: Modified independent Equipment used: None Transfers: Sit to/from Stand Sit to Stand: Supervision         General transfer comment: increased time    Balance Overall balance assessment: Modified Independent Sitting-balance support: Feet supported;No upper extremity supported Sitting balance-Leahy Scale: Good       Standing balance-Leahy Scale: Fair                             ADL either performed or assessed  with clinical judgement   ADL Overall ADL's : Needs assistance/impaired     Grooming: Wash/dry hands;Set up;Sitting                                       Vision       Perception     Praxis      Cognition Arousal/Alertness: Awake/alert Behavior During Therapy: WFL for tasks assessed/performed Overall Cognitive Status: Within Functional Limits for tasks assessed                                 General Comments: perseverating on nausea        Exercises Other Exercises Other Exercises: Pt only minimally agreeable to session d/t reports of nausea. MOD I to come to sitting and SETUP for washing/drying hands in prep for dinner. Tolerates ~7 mins EOB sitting Other Exercises: able to stand at window in room and perform balance assessment with safe technique.   Shoulder Instructions       General Comments      Pertinent Vitals/ Pain       Pain Assessment: Faces Faces Pain Scale: Hurts a little bit Pain Location: nausea/stomach ache Pain Descriptors / Indicators: Discomfort Pain Intervention(s): Limited activity within patient's tolerance;Monitored during session  Home Living  Prior Functioning/Environment              Frequency  Min 2X/week        Progress Toward Goals  OT Goals(current goals can now be found in the care plan section)  Progress towards OT goals: Progressing toward goals  Acute Rehab OT Goals Patient Stated Goal: to feel better OT Goal Formulation: With patient Time For Goal Achievement: 04/12/21 Potential to Achieve Goals: Good  Plan Discharge plan remains appropriate;Frequency remains appropriate    Co-evaluation                 AM-PAC OT "6 Clicks" Daily Activity     Outcome Measure   Help from another person eating meals?: None Help from another person taking care of personal grooming?: None Help from another person toileting, which  includes using toliet, bedpan, or urinal?: A Little Help from another person bathing (including washing, rinsing, drying)?: A Little Help from another person to put on and taking off regular upper body clothing?: A Little Help from another person to put on and taking off regular lower body clothing?: A Little 6 Click Score: 20    End of Session    OT Visit Diagnosis: Muscle weakness (generalized) (M62.81);Unsteadiness on feet (R26.81)   Activity Tolerance Patient tolerated treatment well   Patient Left in bed;with call bell/phone within reach   Nurse Communication          Time: 2956-2130 OT Time Calculation (min): 8 min  Charges: OT General Charges $OT Visit: 1 Visit OT Treatments $Self Care/Home Management : 8-22 mins  Rejeana Brock, MS, OTR/L ascom [phone] 03/31/21, 6:21 PM

## 2021-03-31 NOTE — TOC Progression Note (Signed)
Transition of Care [facility]) - Progression Note    Patient Details  Name: Mark [name] MRN: [id number] Date of Birth: 1961/05/10  [facility] CM/SW Contact  Allayne Butcher, RN Phone Number: 03/31/2021, 2:21 PM  Clinical Narrative:    Patient is no longer having dizziness that impairs his mobility.  PT is recommending home health.  Kandee Keen with [facility] has accepted referral for PT, OT, and SW.     Expected Discharge Plan: Home w Home Health Services Barriers to Discharge: Continued Medical Work up  Expected Discharge Plan and Services Expected Discharge Plan: Home w Home Health Services   Discharge Planning Services: CM Consult Post Acute Care Choice: Home Health Living arrangements for the past 2 months: Single Family Home Expected Discharge Date: 03/30/21               DME Arranged: N/A DME Agency: NA       HH Arranged: PT, OT, Social Work HH Agency: [facility] Date HH Agency Contacted: 03/31/21 Time HH Agency Contacted: 1421 Representative spoke with at [facility] Agency: Kandee Keen   Social Determinants of Health (SDOH) Interventions    Readmission Risk Interventions No flowsheet data found.

## 2021-03-31 NOTE — Care Management Important Message (Signed)
Important Message  Patient Details  Name: CARLSON BELLAND MRN: [id number] Date of Birth: 19-Apr-1961   Medicare Important Message Given:  Other (see comment)  Patient is in an isolation room so I called his room [phone]) to review the Important Message from Medicare but there was no answer.  Will try again.  Olegario Messier A Wandalee Klang 03/31/2021, 2:56 PM

## 2021-04-01 MED ORDER — CHLORHEXIDINE GLUCONATE CLOTH 2 % EX PADS
6.0000 | MEDICATED_PAD | Freq: Every day | CUTANEOUS | Status: DC
  Administered 2021-04-01 – 2021-04-04 (×4): 6 via TOPICAL

## 2021-04-01 MED ORDER — FINASTERIDE 5 MG PO TABS
5.0000 mg | ORAL_TABLET | Freq: Every day | ORAL | Status: DC
  Administered 2021-04-01 – 2021-04-04 (×4): 5 mg via ORAL
  Filled 2021-04-01 (×4): qty 1

## 2021-04-01 MED ORDER — NICOTINE 21 MG/24HR TD PT24
21.0000 mg | MEDICATED_PATCH | Freq: Every day | TRANSDERMAL | Status: DC
  Administered 2021-04-02 – 2021-04-04 (×3): 21 mg via TRANSDERMAL
  Filled 2021-04-01 (×3): qty 1

## 2021-04-01 MED ORDER — LORAZEPAM 2 MG/ML IJ SOLN
0.5000 mg | Freq: Two times a day (BID) | INTRAMUSCULAR | Status: AC | PRN
Start: 2021-04-01 — End: 2021-04-01
  Administered 2021-04-01: 0.5 mg via INTRAVENOUS
  Filled 2021-04-01: qty 1

## 2021-04-01 NOTE — TOC Progression Note (Signed)
Transition of Care [facility]) - Progression Note    Patient Details  Name: KINGSLEE MAIRENA MRN: [id number] Date of Birth: Nov 29, 1960  [facility]) CM/SW Contact  Allayne Butcher, RN Phone Number: 04/01/2021, 11:19 AM  Clinical Narrative:    RNCM spoke with patient's x wife about discharge planning.  Home health has been arranged with [facility] and patient will likely discharge tomorrow.  X wife or son will be able to pick patient up at DC.   Expected Discharge Plan: Home w Home Health Services Barriers to Discharge: Continued Medical Work up  Expected Discharge Plan and Services Expected Discharge Plan: Home w Home Health Services   Discharge Planning Services: CM Consult Post Acute Care Choice: Home Health Living arrangements for the past 2 months: Single Family Home Expected Discharge Date: 03/30/21               DME Arranged: N/A DME Agency: NA       HH Arranged: PT, OT, Social Work HH Agency: [facility] Date HH Agency Contacted: 03/31/21 Time HH Agency Contacted: 1421 Representative spoke with at [facility] Agency: Kandee Keen   Social Determinants of Health (SDOH) Interventions    Readmission Risk Interventions No flowsheet data found.

## 2021-04-02 DIAGNOSIS — R42 Dizziness and giddiness: Secondary | ICD-10-CM | POA: Diagnosis not present

## 2021-04-02 DIAGNOSIS — J189 Pneumonia, unspecified organism: Secondary | ICD-10-CM | POA: Diagnosis not present

## 2021-04-02 NOTE — Progress Notes (Signed)
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  Son also states that pt has been known to hold urine until point of pain to receive pain medication. Md notified.

## 2021-04-02 NOTE — Progress Notes (Signed)
PROGRESS NOTE                                                                                                                                                                                                             Patient Demographics:    Mark [name], is a 60 y.o. male, DOB - [birth year], BJY:[id number]  Outpatient Primary MD for the patient is Marjie Skiffannady, Jolene T, NP    LOS - 5  Admit date - 03/27/2021    Chief Complaint  Patient presents with   Dizziness       Brief Narrative (HPI from H&P)    - Mark [name] is a 60 y.o. male with medical history significant for depression history of kidney stones hypertension history of stroke who presented to the emergency room with complaint of dizziness and feeling generally weak, had preceding history of diarrhea and some generalized abdominal pain for the last few days along with a mild cough which was productive of yellow phlegm.  In the ER he was diagnosed with COVID-19 infection, bacterial pneumonia, COVID-19 gastroenteritis with severe dehydration and hypotension and admitted to the hospital.   Subjective:   Patient in bed denies any headache, no chest or abdominal pain, no shortness of breath.   Assessment  & Plan :    Acute incidental COVID 19 infection with CAP and COVID-19 gastroenteritis - he has evidence of bacterial pneumonia for which he is on IV antibiotics, he has been aggressively hydrated with IV fluids and his blood pressures and orthostatics are improving, still weak qualifies for SNF, will request case management to evaluate, continue to monitor closely his inflammatory markers and him clinically.  Encouraged the patient to sit up in chair in the daytime use I-S and flutter valve for pulmonary toiletry.  Will advance activity and titrate down oxygen as possible.   SpO2: 98 %  Recent Labs  Lab 03/27/21 0837 03/27/21 1218 03/28/21 0837 03/29/21 0454  03/30/21 0608 03/31/21 0537  WBC 12.9*  12.9*  --  12.2* 9.9 13.3* 12.0*  HGB 15.6  15.6  --  13.7 13.8 15.0 15.0  HCT 44.4  43.8  --  38.2* 39.5 43.3 43.6  PLT 509*  476*  --  503* 570* 628* 585*  BNP  --   --  81.6 182.2* 236.9* 57.6  PROCALCITON  --   --  6.62 3.27 1.31 0.59  AST 14*  --   --  12* 16 14*  ALT 19  --   --  21 16 15   ALKPHOS 106  --   --  124 118 103  BILITOT 1.0  --   --  0.7 0.9 0.7  ALBUMIN 3.5  --   --  3.1* 3.4* 3.7  LATICACIDVEN  --  0.9  --   --   --   --     2.  Severe dehydration with orthostatic hypotension.  Hydrate with IV fluids, improved.  3.  Bowel resection.  Abdominal CT nonacute, chronic ureteric obstruction and hydronephrosis noted, no concerning symptoms will monitor closely.  Abdominal exam is benign for now.  He does have chronic intermittent abdominal pain and discomfort which is at his baseline, his symptoms are actually much improved.  Continue supportive care.  4.  Gastroenteritis.  Most likely COVID, ruled out C. difficile.  Gastroenteritis was present on admission.  With supportive care improving.   5.  Essential hypertension.  On Norvasc, Lopressor adjusted.  6.  Hypokalemia.  Replaced PO + IV.  Improved.  7. H/O CVA - supportive care, has had some cognitive decline since then.  8.BPH - he is on Flomax and this was continued however he developed urinary outflow obstruction on 04/01/2021, bladder scan showed over 700 cc of urine, he had significant discomfort, Proscar was added and Foley was placed.   Disposition.  Likely SNF, family now not comfortable taking him home with a Foley catheter.      Condition - Extremely Guarded  Family Communication  : Called  ex wife on [phone] - 03/30/21, 04/02/21  Code Status :  Full  Consults  :  None  PUD Prophylaxis : PPI   Procedures  :     CT  - 1. Tree-in-bud pattern in the RIGHT lower lobe consistent with pulmonary bronchopneumonia. 2. Mild residual hydronephrosis on the RIGHT  related to prior RIGHT obstructing mid ureteral calculus. No obstructing calculus now present within the RIGHT ureter. 3. No clear explanation for LEFT lower quadrant pain as reported.      Disposition Plan  :    Status is: IP   Dispo: The patient is from: Home              Anticipated d/c is to: Home/SNF              Patient currently is medically stable but family not comfortable taking him home with Foley catheter.   Difficult to place patient No  DVT Prophylaxis  :    enoxaparin (LOVENOX) injection 40 mg Start: 03/27/21 1515 Place TED hose Start: 03/27/21 1503    Lab Results  Component Value Date   PLT 585 (H) 03/31/2021    Diet :  Diet Order             DIET SOFT Room service appropriate? Yes; Fluid consistency: Thin  Diet effective now                    Inpatient Medications  Scheduled Meds:  amLODipine  5 mg Oral Daily   aspirin EC  81 mg Oral Daily   Chlorhexidine Gluconate Cloth  6 each Topical Daily   citalopram  20 mg Oral Daily   enoxaparin (LOVENOX) injection  40 mg Subcutaneous Q24H   ferrous sulfate  325 mg Oral Q breakfast   finasteride  5 mg Oral Daily   melatonin  10 mg Oral QHS   metoprolol tartrate  50 mg Oral BID   nicotine  21 mg Transdermal Daily   pantoprazole  40 mg Oral Daily   pravastatin  20 mg Oral Daily   sucralfate  1 g Oral TID WC & HS   tamsulosin  0.4 mg Oral Daily   cyanocobalamin  1,000 mcg Oral Daily   Continuous Infusions:  promethazine (PHENERGAN) injection (IM or IVPB) Stopped (03/31/21 0425)   PRN Meds:.acetaminophen, albuterol, loperamide, morphine injection, ondansetron, ondansetron (ZOFRAN) IV, promethazine (PHENERGAN) injection (IM or IVPB), traMADol  Antibiotics Given (last 72 hours)     Date/Time Action Medication Dose Rate   03/30/21 1603 New Bag/Given   cefTRIAXone (ROCEPHIN) 1 g in sodium chloride 0.9 % 100 mL IVPB 1 g 200 mL/hr   03/31/21 0829 Given   azithromycin (ZITHROMAX) tablet 500 mg 500 mg     03/31/21 1619 New Bag/Given   cefTRIAXone (ROCEPHIN) 1 g in sodium chloride 0.9 % 100 mL IVPB 1 g 200 mL/hr   04/01/21 6045 Given   azithromycin (ZITHROMAX) tablet 500 mg 500 mg    04/01/21 1554 New Bag/Given   cefTRIAXone (ROCEPHIN) 1 g in sodium chloride 0.9 % 100 mL IVPB 1 g 200 mL/hr         Time Spent in minutes  30   Susa Raring M.D on 04/02/2021 at 9:38 AM  To page go to [email or web address]   [facility] -  Office  [phone]    See all Orders from today for further details    Objective:   Vitals:   04/01/21 1139 04/01/21 2227 04/02/21 0542 04/02/21 0823  BP: (!) 137/94 (!) 135/92 (!) 145/89 (!) 119/93  Pulse: (!) 59 (!) 57 70 77  Resp: Temp: 97.6 F (36.4 C) 98.3 F (36.8 C) 98 F (36.7 C) (!) 97.3 F (36.3 C)  TempSrc: Oral     SpO2: 96% 94% 96% 98%  Weight:      Height:        Wt Readings from Last 3 Encounters:  03/27/21 63.5 kg  03/22/21 65.8 kg  03/18/21 70.3 kg     Intake/Output Summary (Last 24 hours) at 04/02/2021 8295 Last data filed at 04/02/2021 0700 Gross per 24 hour  Intake --  Output 2150 ml  Net -2150 ml     Physical Exam  Awake Alert, No new F.N deficits,   [place].AT,PERRAL Supple Neck,No JVD, No cervical lymphadenopathy appriciated.  Symmetrical Chest wall movement, Good air movement bilaterally, CTAB RRR,No Gallops, Rubs or new Murmurs, No Parasternal Heave +ve B.Sounds, Abd Soft, No tenderness, No organomegaly appriciated, No rebound - guarding or rigidity, foley in place No Cyanosis,      Data Review:    CBC Recent Labs  Lab 03/27/21 0837 03/28/21 0837 03/29/21 0454 03/30/21 0608 03/31/21 0537  WBC 12.9*  12.9* 12.2* 9.9 13.3* 12.0*  HGB 15.6  15.6 13.7 13.8 15.0 15.0  HCT 44.4  43.8 38.2* 39.5 43.3 43.6  PLT 509*  476* 503* 570* 628* 585*  MCV 90.4  90.1 91.4 88.2 89.6 92.2  MCH 31.8  32.1 32.8 30.8 31.1 31.7  MCHC 35.1  35.6 35.9 34.9 34.6 34.4  RDW 14.0  13.9 14.0 13.8 14.2 13.8   LYMPHSABS 2.0  --  1.5 1.6 3.3  MONOABS 1.7*  --  0.3 0.7 1.4*  EOSABS 0.0  --  0.0 0.0 0.0  BASOSABS 0.0  --  0.0 0.0 0.0  Recent Labs  Lab 03/27/21 0837 03/27/21 1218 03/28/21 0837 03/29/21 0454 03/30/21 0608 03/31/21 0537  NA 137  --  136 140 141 143  K 3.1*  --  2.9* 3.5 4.1 3.7  CL 105  --  105 108 104 102  CO2 21*  --  19* 23 29 32  GLUCOSE 114*  --  97 135* 125* 91  BUN 21*  --  13 8 8 13   CREATININE 0.91  --  0.90 0.61 0.91 0.86  CALCIUM 8.9  --  8.3* 8.6* 9.0 9.0  AST 14*  --   --  12* 16 14*  ALT 19  --   --  21 16 15   ALKPHOS 106  --   --  124 118 103  BILITOT 1.0  --   --  0.7 0.9 0.7  ALBUMIN 3.5  --   --  3.1* 3.4* 3.7  MG  --   --  2.0 2.1 2.1 2.2  PROCALCITON  --   --  6.62 3.27 1.31 0.59  LATICACIDVEN  --  0.9  --   --   --   --   BNP  --   --  81.6 182.2* 236.9* 57.6    ------------------------------------------------------------------------------------------------------------------ No results for input(s): CHOL, HDL, LDLCALC, TRIG, CHOLHDL, LDLDIRECT in the last 72 hours.  Lab Results  Component Value Date   HGBA1C 5.6 06/23/2020   ------------------------------------------------------------------------------------------------------------------ No results for input(s): TSH, T4TOTAL, T3FREE, THYROIDAB in the last 72 hours.  Invalid input(s): FREET3  Cardiac Enzymes No results for input(s): CKMB, TROPONINI, MYOGLOBIN in the last 168 hours.  Invalid input(s): CK ------------------------------------------------------------------------------------------------------------------    Component Value Date/Time   BNP 57.6 03/31/2021 0537      Radiology Reports DG Chest 2 View  Result Date: 03/27/2021 CLINICAL DATA:  Cough. EXAM: CHEST - 2 VIEW COMPARISON:  September 12, 2011 FINDINGS: No pneumothorax. The cardiomediastinal silhouette is stable. Bibasilar pulmonary infiltrates, right greater than left identified. No other acute abnormalities.  IMPRESSION: Bibasilar infiltrates consistent with developing pneumonia or aspiration. Recommend follow-up to resolution. No other changes. Electronically Signed   By: 03/29/2021 III M.D.   On: 03/27/2021 10:00   DG Abd 1 View  Result Date: 03/28/2021 CLINICAL DATA:  Nausea and abdominal pain EXAM: ABDOMEN - 1 VIEW COMPARISON:  CT from the previous day. FINDINGS: Scattered large and small bowel gas is noted. Postsurgical changes are noted on the right. Multiple ingested tablets are seen. No bony abnormality is noted. IMPRESSION: No acute abnormality noted. Electronically Signed   By: 03/29/2021 M.D.   On: 03/28/2021 17:13   CT ABDOMEN PELVIS W CONTRAST  Result Date: 03/27/2021 CLINICAL DATA:  Dizziness and weakness.  Positive COVID. EXAM: CT ABDOMEN AND PELVIS WITH CONTRAST TECHNIQUE: Multidetector CT imaging of the abdomen and pelvis was performed using the standard protocol following bolus administration of intravenous contrast. CONTRAST:  48mL OMNIPAQUE IOHEXOL 350 MG/ML SOLN COMPARISON:  None. FINDINGS: Lower chest: Branching nodular pattern in the RIGHT lower lobe (so-called tree in bud) consistent with RIGHT lower lobe pulmonary infection. No focal consolidation. Hepatobiliary: Benign vascular enhancement along the anterior RIGHT hepatic lobe (image 10/2). No worrisome hepatic lesion. Postcholecystectomy Pancreas: Pancreas is normal. No ductal dilatation. No pancreatic inflammation. Spleen: Spleen is irregular lobular contour. Adrenals/urinary tract: Adrenal glands normal. There is mild persistent hydronephrosis and proximal hydroureter on the RIGHT which is improved from prior. Interval clearing of the mid RIGHT ureteral calculus seen on comparison CT exam. No  ureteral calculi remain. No bladder calculi. LEFT kidney and ureter normal. Stomach/Bowel: Stomach and small bowel normal. Colon and rectosigmoid colon normal. Vascular/Lymphatic: Abdominal aorta is normal caliber with atherosclerotic  calcification. There is no retroperitoneal or periportal lymphadenopathy. No pelvic lymphadenopathy. Reproductive: Prostate unremarkable. Other: No free fluid. Musculoskeletal: No aggressive osseous lesion. IMPRESSION: 1. Tree-in-bud pattern in the RIGHT lower lobe consistent with pulmonary bronchopneumonia. 2. Mild residual hydronephrosis on the RIGHT related to prior RIGHT obstructing mid ureteral calculus. No obstructing calculus now present within the RIGHT ureter. 3. No clear explanation for LEFT lower quadrant pain as reported. Electronically Signed   By: Genevive Bi M.D.   On: 03/27/2021 11:46   DG Chest Port 1 View  Result Date: 03/31/2021 CLINICAL DATA:  SOB. Hx of recent COVID. EXAM: PORTABLE CHEST - 1 VIEW COMPARISON:  Radiograph 03/27/2021, CT 05/11/2020 FINDINGS: Persistent coarse airspace opacities at the right lung base, and to a lesser degree in the left infrahilar region. Lungs otherwise clear. Heart size and mediastinal contours are within normal limits. No effusion.  No pneumothorax. Visualized bones unremarkable. IMPRESSION: Stable left infrahilar and right basilar scattered airspace opacities Electronically Signed   By: Corlis Leak M.D.   On: 03/31/2021 08:53   DG Abd Portable 1V  Result Date: 03/31/2021 CLINICAL DATA:  Nausea vomiting. EXAM: PORTABLE ABDOMEN - 1 VIEW COMPARISON:  03/30/2021 FINDINGS: No gaseous small bowel dilatation. Diffuse gaseous distention of colon again noted, similar to prior. Numerous surgical clips noted in the abdomen, right greater than left with right lower abdominal staple line evident. Visualized bony anatomy unremarkable. IMPRESSION: Stable exam. Mild diffuse gaseous distention of colon. No findings to suggest bowel obstruction. Electronically Signed   By: Kennith Center M.D.   On: 03/31/2021 08:52   DG Abd Portable 1V  Result Date: 03/30/2021 CLINICAL DATA:  Abdominal pain.  COVID. EXAM: PORTABLE ABDOMEN - 1 VIEW COMPARISON:  03/28/2021 FINDINGS:  The bowel gas pattern is normal. No dilated loops of small bowel. Gas noted within normal caliber colon. Numerous surgical clips noted within both sides of abdomen. Suture chain identified within the right lower quadrant. No radio-opaque calculi or other significant radiographic abnormality are seen. IMPRESSION: Nonobstructive bowel gas pattern. Electronically Signed   By: Signa Kell M.D.   On: 03/30/2021 12:49

## 2021-04-03 DIAGNOSIS — R42 Dizziness and giddiness: Secondary | ICD-10-CM | POA: Diagnosis not present

## 2021-04-03 DIAGNOSIS — J189 Pneumonia, unspecified organism: Secondary | ICD-10-CM | POA: Diagnosis not present

## 2021-04-03 LAB — COMPREHENSIVE METABOLIC PANEL WITH GFR
ALT: 18 U/L (ref 0–44)
AST: 16 U/L (ref 15–41)
Albumin: 3.2 g/dL — ABNORMAL LOW (ref 3.5–5.0)
Alkaline Phosphatase: 71 U/L (ref 38–126)
Anion gap: 11 (ref 5–15)
BUN: 13 mg/dL (ref 6–20)
CO2: 22 mmol/L (ref 22–32)
Calcium: 8.3 mg/dL — ABNORMAL LOW (ref 8.9–10.3)
Chloride: 105 mmol/L (ref 98–111)
Creatinine, Ser: 0.78 mg/dL (ref 0.61–1.24)
GFR, Estimated: >60 mL/min
Glucose, Bld: 82 mg/dL (ref 70–99)
Potassium: 3.6 mmol/L (ref 3.5–5.1)
Sodium: 138 mmol/L (ref 135–145)
Total Bilirubin: 1 mg/dL (ref 0.3–1.2)
Total Protein: 5.9 g/dL — ABNORMAL LOW (ref 6.5–8.1)

## 2021-04-03 LAB — CBC WITH DIFFERENTIAL/PLATELET
Abs Immature Granulocytes: 0.05 10*3/uL (ref 0.00–0.07)
Basophils Absolute: 0 10*3/uL (ref 0.0–0.1)
Basophils Relative: 0 %
Eosinophils Absolute: 0.2 10*3/uL (ref 0.0–0.5)
Eosinophils Relative: 2 %
HCT: 45.7 % (ref 39.0–52.0)
Hemoglobin: 14.6 g/dL (ref 13.0–17.0)
Immature Granulocytes: 1 %
Lymphocytes Relative: 27 %
Lymphs Abs: 2.6 10*3/uL (ref 0.7–4.0)
MCH: 30.7 pg (ref 26.0–34.0)
MCHC: 31.9 g/dL (ref 30.0–36.0)
MCV: 96 fL (ref 80.0–100.0)
Monocytes Absolute: 0.7 10*3/uL (ref 0.1–1.0)
Monocytes Relative: 7 %
Neutro Abs: 6.1 10*3/uL (ref 1.7–7.7)
Neutrophils Relative %: 63 %
Platelets: 428 10*3/uL — ABNORMAL HIGH (ref 150–400)
RBC: 4.76 MIL/uL (ref 4.22–5.81)
RDW: 13.4 % (ref 11.5–15.5)
WBC: 9.7 10*3/uL (ref 4.0–10.5)
nRBC: 0 % (ref 0.0–0.2)

## 2021-04-03 LAB — MAGNESIUM: Magnesium: 2.6 mg/dL — ABNORMAL HIGH (ref 1.7–2.4)

## 2021-04-03 MED ORDER — ZOLPIDEM TARTRATE 5 MG PO TABS
5.0000 mg | ORAL_TABLET | Freq: Every evening | ORAL | Status: DC | PRN
  Administered 2021-04-03: 5 mg via ORAL
  Filled 2021-04-03: qty 1

## 2021-04-03 NOTE — Progress Notes (Addendum)
PROGRESS NOTE                                                                                                                                                                                                             Patient Demographics:    Mark [name], is a 60 y.o. male, DOB - [birth year], ZOX:[id number]  Outpatient Primary MD for the patient is Marjie Skiffannady, Jolene T, NP    LOS - 6  Admit date - 03/27/2021    Chief Complaint  Patient presents with   Dizziness       Brief Narrative (HPI from H&P)    - Mark [name] is a 60 y.o. male with medical history significant for depression history of kidney stones hypertension history of stroke who presented to the emergency room with complaint of dizziness and feeling generally weak, had preceding history of diarrhea and some generalized abdominal pain for the last few days along with a mild cough which was productive of yellow phlegm.  In the ER he was diagnosed with COVID-19 infection, bacterial pneumonia, COVID-19 gastroenteritis with severe dehydration and hypotension and admitted to the hospital.   Subjective:   Patient in bed appears to be in no discomfort, denies any headache chest or abdominal pain, wants his Foley catheter to be removed.   Assessment  & Plan :    Acute incidental COVID 19 infection with CAP and COVID-19 gastroenteritis - he has evidence of bacterial pneumonia for which he is on IV antibiotics, he has been aggressively hydrated with IV fluids and his blood pressures and orthostatics are improving, still weak qualifies for SNF, will request case management to evaluate, continue to monitor closely his inflammatory markers and him clinically.  His initial positive COVID test was 03/22/2021.  He is now off of isolation.  Encouraged the patient to sit up in chair in the daytime use I-S and flutter valve for pulmonary toiletry.  Will advance activity and titrate  down oxygen as possible.   SpO2: 94 %  Recent Labs  Lab 03/27/21 1218 03/28/21 0837 03/29/21 0454 03/30/21 0608 03/31/21 0537 04/03/21 0600  WBC  --  12.2* 9.9 13.3* 12.0* 9.7  HGB  --  13.7 13.8 15.0 15.0 14.6  HCT  --  38.2* 39.5 43.3 43.6 45.7  PLT  --  503* 570* 628* 585* 428*  BNP  --  81.6 182.2* 236.9* 57.6  --   PROCALCITON  --  6.62 3.27 1.31 0.59  --   AST  --   --  12* 16 14* 16  ALT  --   --  21 16 15 18   ALKPHOS  --   --  124 118 103 71  BILITOT  --   --  0.7 0.9 0.7 1.0  ALBUMIN  --   --  3.1* 3.4* 3.7 3.2*  LATICACIDVEN 0.9  --   --   --   --   --     2.  Severe dehydration with orthostatic hypotension.  Hydrate with IV fluids, improved.  3.  Bowel resection.  Abdominal CT nonacute, chronic ureteric obstruction and hydronephrosis noted, no concerning symptoms will monitor closely.  Abdominal exam is benign for now.  He does have chronic intermittent abdominal pain and discomfort which is at his baseline, his symptoms are actually much improved.  Continue supportive care.  Will minimize narcotic use due to previous history of narcotic abuse per son.  4.  Gastroenteritis.  Most likely COVID, ruled out C. difficile.  Gastroenteritis was present on admission.  With supportive care improving.   5.  Essential hypertension.  On Norvasc, Lopressor adjusted.  6.  Hypokalemia.  Replaced PO + IV.  Improved.  7. H/O CVA - supportive care, has had some cognitive decline since then.  8.BPH -he was already on Flomax, developed urinary retention and Foley catheter was placed on 04/01/2021, Proscar has been added, patient wants his catheter removed will give a trial of Foley removal on 04/03/2021 and monitor bladder scans closely.   Disposition.  Likely SNF, family now not comfortable taking him home with a Foley catheter.      Condition - Extremely Guarded  Family Communication  : Called  ex wife on [phone] - 03/30/21, 04/02/21, had detailed discussion with patient's  son on 04/02/2021.  He informed me that patient has longstanding history of narcotic abuse, he was getting narcotics from the street.  Code Status :  Full  Consults  :  None  PUD Prophylaxis : PPI   Procedures  :     CT  - 1. Tree-in-bud pattern in the RIGHT lower lobe consistent with pulmonary bronchopneumonia. 2. Mild residual hydronephrosis on the RIGHT related to prior RIGHT obstructing mid ureteral calculus. No obstructing calculus now present within the RIGHT ureter. 3. No clear explanation for LEFT lower quadrant pain as reported.      Disposition Plan  :    Status is: IP   Dispo: The patient is from: Home              Anticipated d/c is to: Home/SNF              Patient currently is medically stable but family not comfortable taking him home with Foley catheter.   Difficult to place patient No  DVT Prophylaxis  :    enoxaparin (LOVENOX) injection 40 mg Start: 03/27/21 1515 Place TED hose Start: 03/27/21 1503    Lab Results  Component Value Date   PLT 428 (H) 04/03/2021    Diet :  Diet Order             DIET SOFT Room service appropriate? Yes; Fluid consistency: Thin  Diet effective now                    Inpatient Medications  Scheduled Meds:  amLODipine  5 mg Oral Daily   aspirin EC  81 mg Oral Daily   Chlorhexidine Gluconate Cloth  6 each Topical Daily   citalopram  20 mg Oral Daily   enoxaparin (LOVENOX) injection  40 mg Subcutaneous Q24H   ferrous sulfate  325 mg Oral Q breakfast   finasteride  5 mg Oral Daily   melatonin  10 mg Oral QHS   metoprolol tartrate  50 mg Oral BID   nicotine  21 mg Transdermal Daily   pantoprazole  40 mg Oral Daily   pravastatin  20 mg Oral Daily   sucralfate  1 g Oral TID WC & HS   tamsulosin  0.4 mg Oral Daily   cyanocobalamin  1,000 mcg Oral Daily   Continuous Infusions:  promethazine (PHENERGAN) injection (IM or IVPB) Stopped (03/31/21 0425)   PRN Meds:.acetaminophen, albuterol, loperamide, ondansetron,  ondansetron (ZOFRAN) IV, promethazine (PHENERGAN) injection (IM or IVPB), traMADol  Antibiotics Given (last 72 hours)     Date/Time Action Medication Dose Rate   03/31/21 1619 New Bag/Given   cefTRIAXone (ROCEPHIN) 1 g in sodium chloride 0.9 % 100 mL IVPB 1 g 200 mL/hr   04/01/21 0865 Given   azithromycin (ZITHROMAX) tablet 500 mg 500 mg    04/01/21 1554 New Bag/Given   cefTRIAXone (ROCEPHIN) 1 g in sodium chloride 0.9 % 100 mL IVPB 1 g 200 mL/hr         Time Spent in minutes  30   Susa Raring M.D on 04/03/2021 at 9:55 AM  To page go to [email or web address]   [facility] -  Office  [phone]    See all Orders from today for further details    Objective:   Vitals:   04/02/21 1550 04/02/21 2130 04/03/21 0439 04/03/21 0800  BP: 115/88 (!) 128/94 138/86 (!) 160/99  Pulse: 70 63 (!) 58 71  Resp: Temp: 98.4 F (36.9 C) 97.9 F (36.6 C) 98.1 F (36.7 C) 98.4 F (36.9 C)  TempSrc: Oral Oral  Oral  SpO2: 97% 95% 95% 94%  Weight:      Height:        Wt Readings from Last 3 Encounters:  03/27/21 63.5 kg  03/22/21 65.8 kg  03/18/21 70.3 kg     Intake/Output Summary (Last 24 hours) at 04/03/2021 0955 Last data filed at 04/02/2021 2123 Gross per 24 hour  Intake --  Output 375 ml  Net -375 ml     Physical Exam  Awake Alert, No new F.N deficits, Normal affect [place].AT,PERRAL Supple Neck,No JVD, No cervical lymphadenopathy appriciated.  Symmetrical Chest wall movement, Good air movement bilaterally, CTAB RRR,No Gallops, Rubs or new Murmurs, No Parasternal Heave +ve B.Sounds, Abd Soft, No tenderness, Foley in No Cyanosis, Clubbing or edema, No new Rash or bruise      Data Review:    CBC Recent Labs  Lab 03/28/21 0837 03/29/21 0454 03/30/21 0608 03/31/21 0537 04/03/21 0600  WBC 12.2* 9.9 13.3* 12.0* 9.7  HGB 13.7 13.8 15.0 15.0 14.6  HCT 38.2* 39.5 43.3 43.6 45.7  PLT 503* 570* 628* 585* 428*  MCV 91.4 88.2 89.6 92.2 96.0  MCH 32.8  30.8 31.1 31.7 30.7  MCHC 35.9 34.9 34.6 34.4 31.9  RDW 14.0 13.8 14.2 13.8 13.4  LYMPHSABS  --  1.5 1.6 3.3 2.6  MONOABS  --  0.3 0.7 1.4* 0.7  EOSABS  --  0.0 0.0 0.0 0.2  BASOSABS  --  0.0 0.0 0.0 0.0  Recent Labs  Lab 03/27/21 1218 03/28/21 0837 03/29/21 0454 03/30/21 0608 03/31/21 0537 04/03/21 0600  NA  --  136 140 141 143 138  K  --  2.9* 3.5 4.1 3.7 3.6  CL  --  105 108 104 102 105  CO2  --  19* 23 29 32 22  GLUCOSE  --  97 135* 125* 91 82  BUN  --  13 8 8 13 13   CREATININE  --  0.90 0.61 0.91 0.86 0.78  CALCIUM  --  8.3* 8.6* 9.0 9.0 8.3*  AST  --   --  12* 16 14* 16  ALT  --   --  21 16 15 18   ALKPHOS  --   --  124 118 103 71  BILITOT  --   --  0.7 0.9 0.7 1.0  ALBUMIN  --   --  3.1* 3.4* 3.7 3.2*  MG  --  2.0 2.1 2.1 2.2 2.6*  PROCALCITON  --  6.62 3.27 1.31 0.59  --   LATICACIDVEN 0.9  --   --   --   --   --   BNP  --  81.6 182.2* 236.9* 57.6  --     ------------------------------------------------------------------------------------------------------------------ No results for input(s): CHOL, HDL, LDLCALC, TRIG, CHOLHDL, LDLDIRECT in the last 72 hours.  Lab Results  Component Value Date   HGBA1C 5.6 06/23/2020   ------------------------------------------------------------------------------------------------------------------ No results for input(s): TSH, T4TOTAL, T3FREE, THYROIDAB in the last 72 hours.  Invalid input(s): FREET3  Cardiac Enzymes No results for input(s): CKMB, TROPONINI, MYOGLOBIN in the last 168 hours.  Invalid input(s): CK ------------------------------------------------------------------------------------------------------------------    Component Value Date/Time   BNP 57.6 03/31/2021 0537      Radiology Reports DG Chest 2 View  Result Date: 03/27/2021 CLINICAL DATA:  Cough. EXAM: CHEST - 2 VIEW COMPARISON:  September 12, 2011 FINDINGS: No pneumothorax. The cardiomediastinal silhouette is stable. Bibasilar pulmonary  infiltrates, right greater than left identified. No other acute abnormalities. IMPRESSION: Bibasilar infiltrates consistent with developing pneumonia or aspiration. Recommend follow-up to resolution. No other changes. Electronically Signed   By: 03/29/2021 III M.D.   On: 03/27/2021 10:00   DG Abd 1 View  Result Date: 03/28/2021 CLINICAL DATA:  Nausea and abdominal pain EXAM: ABDOMEN - 1 VIEW COMPARISON:  CT from the previous day. FINDINGS: Scattered large and small bowel gas is noted. Postsurgical changes are noted on the right. Multiple ingested tablets are seen. No bony abnormality is noted. IMPRESSION: No acute abnormality noted. Electronically Signed   By: 03/29/2021 M.D.   On: 03/28/2021 17:13   CT ABDOMEN PELVIS W CONTRAST  Result Date: 03/27/2021 CLINICAL DATA:  Dizziness and weakness.  Positive COVID. EXAM: CT ABDOMEN AND PELVIS WITH CONTRAST TECHNIQUE: Multidetector CT imaging of the abdomen and pelvis was performed using the standard protocol following bolus administration of intravenous contrast. CONTRAST:  86mL OMNIPAQUE IOHEXOL 350 MG/ML SOLN COMPARISON:  None. FINDINGS: Lower chest: Branching nodular pattern in the RIGHT lower lobe (so-called tree in bud) consistent with RIGHT lower lobe pulmonary infection. No focal consolidation. Hepatobiliary: Benign vascular enhancement along the anterior RIGHT hepatic lobe (image 10/2). No worrisome hepatic lesion. Postcholecystectomy Pancreas: Pancreas is normal. No ductal dilatation. No pancreatic inflammation. Spleen: Spleen is irregular lobular contour. Adrenals/urinary tract: Adrenal glands normal. There is mild persistent hydronephrosis and proximal hydroureter on the RIGHT which is improved from prior. Interval clearing of the mid RIGHT ureteral calculus seen on comparison CT exam. No ureteral calculi  remain. No bladder calculi. LEFT kidney and ureter normal. Stomach/Bowel: Stomach and small bowel normal. Colon and rectosigmoid colon normal.  Vascular/Lymphatic: Abdominal aorta is normal caliber with atherosclerotic calcification. There is no retroperitoneal or periportal lymphadenopathy. No pelvic lymphadenopathy. Reproductive: Prostate unremarkable. Other: No free fluid. Musculoskeletal: No aggressive osseous lesion. IMPRESSION: 1. Tree-in-bud pattern in the RIGHT lower lobe consistent with pulmonary bronchopneumonia. 2. Mild residual hydronephrosis on the RIGHT related to prior RIGHT obstructing mid ureteral calculus. No obstructing calculus now present within the RIGHT ureter. 3. No clear explanation for LEFT lower quadrant pain as reported. Electronically Signed   By: Genevive Bi M.D.   On: 03/27/2021 11:46   DG Chest Port 1 View  Result Date: 03/31/2021 CLINICAL DATA:  SOB. Hx of recent COVID. EXAM: PORTABLE CHEST - 1 VIEW COMPARISON:  Radiograph 03/27/2021, CT 05/11/2020 FINDINGS: Persistent coarse airspace opacities at the right lung base, and to a lesser degree in the left infrahilar region. Lungs otherwise clear. Heart size and mediastinal contours are within normal limits. No effusion.  No pneumothorax. Visualized bones unremarkable. IMPRESSION: Stable left infrahilar and right basilar scattered airspace opacities Electronically Signed   By: Corlis Leak M.D.   On: 03/31/2021 08:53   DG Abd Portable 1V  Result Date: 03/31/2021 CLINICAL DATA:  Nausea vomiting. EXAM: PORTABLE ABDOMEN - 1 VIEW COMPARISON:  03/30/2021 FINDINGS: No gaseous small bowel dilatation. Diffuse gaseous distention of colon again noted, similar to prior. Numerous surgical clips noted in the abdomen, right greater than left with right lower abdominal staple line evident. Visualized bony anatomy unremarkable. IMPRESSION: Stable exam. Mild diffuse gaseous distention of colon. No findings to suggest bowel obstruction. Electronically Signed   By: Kennith Center M.D.   On: 03/31/2021 08:52   DG Abd Portable 1V  Result Date: 03/30/2021 CLINICAL DATA:  Abdominal pain.   COVID. EXAM: PORTABLE ABDOMEN - 1 VIEW COMPARISON:  03/28/2021 FINDINGS: The bowel gas pattern is normal. No dilated loops of small bowel. Gas noted within normal caliber colon. Numerous surgical clips noted within both sides of abdomen. Suture chain identified within the right lower quadrant. No radio-opaque calculi or other significant radiographic abnormality are seen. IMPRESSION: Nonobstructive bowel gas pattern. Electronically Signed   By: Signa Kell M.D.   On: 03/30/2021 12:49

## 2021-04-03 NOTE — Progress Notes (Signed)
Patient repeatedly pushes call bell asking for something to help him sleep. When this nurse enters room to talk with patient, patient states that he is cold and to pull his cover up. Patient able to MAEW. Reminded patient of this and patient pulled cover over his head. This nurse explained to the patient that he does not have any orders for sleep other than melatonin which he has already had. Instructed to use call bell when needed but not for sleep medicine which he is reminded he does not have anything. Music, dim lighting in room, and reassurance provided.   Patient continues to call out for being "cold and cant sleep.". Temperature adjusted in room and warm blanket provided to patient.

## 2021-04-03 NOTE — Plan of Care (Signed)
  Problem: Clinical Measurements: Goal: Ability to maintain clinical measurements within normal limits will improve Outcome: Progressing   Problem: Clinical Measurements: Goal: Will remain free from infection Outcome: Progressing   Problem: Clinical Measurements: Goal: Diagnostic test results will improve Outcome: Progressing   Problem: Clinical Measurements: Goal: Respiratory complications will improve Outcome: Progressing   

## 2021-04-04 DIAGNOSIS — R42 Dizziness and giddiness: Secondary | ICD-10-CM | POA: Diagnosis not present

## 2021-04-04 LAB — COMPREHENSIVE METABOLIC PANEL
ALT: 22 U/L (ref 0–44)
AST: 14 U/L — ABNORMAL LOW (ref 15–41)
Albumin: 3.3 g/dL — ABNORMAL LOW (ref 3.5–5.0)
Alkaline Phosphatase: 75 U/L (ref 38–126)
Anion gap: 9 (ref 5–15)
BUN: 10 mg/dL (ref 6–20)
CO2: 25 mmol/L (ref 22–32)
Calcium: 8.3 mg/dL — ABNORMAL LOW (ref 8.9–10.3)
Chloride: 105 mmol/L (ref 98–111)
Creatinine, Ser: 0.86 mg/dL (ref 0.61–1.24)
GFR, Estimated: >60 mL/min (ref >60)
Glucose, Bld: 94 mg/dL (ref 70–99)
Potassium: 2.9 mmol/L — ABNORMAL LOW (ref 3.5–5.1)
Sodium: 139 mmol/L (ref 135–145)
Total Bilirubin: 0.9 mg/dL (ref 0.3–1.2)
Total Protein: 6 g/dL — ABNORMAL LOW (ref 6.5–8.1)

## 2021-04-04 LAB — CBC WITH DIFFERENTIAL/PLATELET
Abs Immature Granulocytes: 0.04 10*3/uL (ref 0.00–0.07)
Basophils Absolute: 0 10*3/uL (ref 0.0–0.1)
Basophils Relative: 0 %
Eosinophils Absolute: 0.2 10*3/uL (ref 0.0–0.5)
Eosinophils Relative: 2 %
HCT: 42.1 % (ref 39.0–52.0)
Hemoglobin: 14.7 g/dL (ref 13.0–17.0)
Immature Granulocytes: 0 %
Lymphocytes Relative: 26 %
Lymphs Abs: 2.5 10*3/uL (ref 0.7–4.0)
MCH: 31.4 pg (ref 26.0–34.0)
MCHC: 34.9 g/dL (ref 30.0–36.0)
MCV: 90 fL (ref 80.0–100.0)
Monocytes Absolute: 0.8 10*3/uL (ref 0.1–1.0)
Monocytes Relative: 9 %
Neutro Abs: 6.1 10*3/uL (ref 1.7–7.7)
Neutrophils Relative %: 63 %
Platelets: 450 10*3/uL — ABNORMAL HIGH (ref 150–400)
RBC: 4.68 MIL/uL (ref 4.22–5.81)
RDW: 13.2 % (ref 11.5–15.5)
WBC: 9.7 10*3/uL (ref 4.0–10.5)
nRBC: 0 % (ref 0.0–0.2)

## 2021-04-04 MED ORDER — POTASSIUM CHLORIDE 10 MEQ/100ML IV SOLN
10.0000 meq | INTRAVENOUS | Status: AC
Start: 2021-04-04 — End: 2021-04-04
  Administered 2021-04-04 (×2): 10 meq via INTRAVENOUS
  Filled 2021-04-04 (×2): qty 100

## 2021-04-04 MED ORDER — HYDRALAZINE HCL 20 MG/ML IJ SOLN: 10.0000 mg | Freq: Four times a day (QID) | INTRAMUSCULAR | Status: DC | PRN

## 2021-04-04 MED ORDER — LACTATED RINGERS IV SOLN: INTRAVENOUS | Status: DC

## 2021-04-04 MED ORDER — NICOTINE 21 MG/24HR TD PT24
21.0000 mg | MEDICATED_PATCH | Freq: Every day | TRANSDERMAL | 0 refills | Status: DC
Start: 2021-04-05 — End: 2022-07-25

## 2021-04-04 MED ORDER — FINASTERIDE 5 MG PO TABS: 5.0000 mg | ORAL_TABLET | Freq: Every day | ORAL | 0 refills | Status: DC

## 2021-04-04 MED ORDER — POTASSIUM CHLORIDE CRYS ER 20 MEQ PO TBCR
20.0000 meq | EXTENDED_RELEASE_TABLET | Freq: Once | ORAL | Status: AC
  Administered 2021-04-04: 20 meq via ORAL
  Filled 2021-04-04: qty 1

## 2021-04-04 MED ORDER — POTASSIUM CHLORIDE CRYS ER 20 MEQ PO TBCR
40.0000 meq | EXTENDED_RELEASE_TABLET | Freq: Once | ORAL | Status: AC
  Administered 2021-04-04: 40 meq via ORAL
  Filled 2021-04-04: qty 2

## 2021-04-04 NOTE — Discharge Summary (Signed)
Mark [name] IZT:[id number] DOB: 1960/11/18 DOA: 03/27/2021  PCP: Marjie Skiff, NP  Admit date: 03/27/2021  Discharge date: 04/04/2021  Admitted From: Home  Disposition:  Home   Recommendations for Outpatient Follow-up:   Follow up with PCP in 1-2 weeks  PCP Please obtain BMP/CBC, 2 view CXR in 1week,  (see Discharge instructions)   PCP Please follow up on the following pending results: CBC, CMP and two-view chest x-ray in 7 to 10 days.   Home Health: PT, RN, SW Equipment/Devices: as below  Consultations: None  Discharge Condition: Stable    CODE STATUS: Full    Diet Recommendation: Heart Healthy   Diet Order             Diet - low sodium heart healthy           DIET SOFT Room service appropriate? Yes; Fluid consistency: Thin  Diet effective now                    Chief Complaint  Patient presents with   Dizziness     Brief history of present illness from the day of admission and additional interim summary     Mark [name] is a 60 y.o. male with medical history significant for depression history of kidney stones hypertension history of stroke who presented to the emergency room with complaint of dizziness and feeling generally weak, had preceding history of diarrhea and some generalized abdominal pain for the last few days along with a mild cough which was productive of yellow phlegm.  In the ER he was diagnosed with COVID-19 infection, bacterial pneumonia, COVID-19 gastroenteritis with severe dehydration and hypotension and admitted to the hospital.                                                                 Hospital Course   Acute incidental COVID 19 infection with CAP and COVID-19 gastroenteritis - he had evidence of bacterial pneumonia for which he was on IV antibiotics, he  has been aggressively hydrated with IV fluids and his blood pressures and orthostatics improved and now normal, worked with PT and now cleared for home PT discharge, symptom-free at baseline will be discharged home with home PT and RN follow-up, request PCP to evaluate him again in a week kindly check CBC, CMP and a two-view chest x-ray.   His initial positive COVID test was 03/22/2021.  He is now off of isolation.  2.  Severe dehydration with orthostatic hypotension.  Hydrate with IV fluids, improved.   3.  COVID gastroenteritis with history of bowel resection.  Abdominal CT nonacute, chronic ureteric obstruction and hydronephrosis noted, no concerning symptoms will monitor closely.  Abdominal exam is benign abortive care symptoms have completely resolved.  He does have chronic intermittent abdominal  pain and discomfort which is at his baseline, well informed by the son that patient was abusing narcotics from the street, please be cautious with prescribing narcotics outpatient and inpatient in the future.  He is pain-free right now.   4.  Gastroenteritis.  Most likely COVID, ruled out C. difficile.  Gastroenteritis was present on admission.  With supportive care improving.   5.  Essential hypertension.  On Norvasc, Lopressor adjusted.   6.  Hypokalemia.  Replaced PO + IV.  Improved.   7. H/O CVA - supportive care, has had some cognitive decline since then.   8.BPH - he was already on Flomax but developed urinary retention, had Foley placed for 2 days which was removed yesterday, Proscar was added, no further issues.  Has been fine for the last 24 hours.  Will be discharged on Proscar for at least a month.   Discharge diagnosis     Active Problems:   Dizziness and giddiness    Discharge instructions    Discharge Instructions     Diet - low sodium heart healthy   Complete by: As directed    Discharge instructions   Complete by: As directed    Follow with Primary MD Aura Dials T,  NP in 7 days   Get CBC, CMP, 2 view Chest X ray -  checked next visit within 1 week by Primary MD   Activity: As tolerated with Full fall precautions use walker/cane & assistance as needed  Disposition Home   Diet: Heart Healthy   Special Instructions: If you have smoked or chewed Tobacco  in the last 2 yrs please stop smoking, stop any regular Alcohol  and or any Recreational drug use.  On your next visit with your primary care physician please Get Medicines reviewed and adjusted.  Please request your Prim.MD to go over all Hospital Tests and Procedure/Radiological results at the follow up, please get all Hospital records sent to your Prim MD by signing hospital release before you go home.  If you experience worsening of your admission symptoms, develop shortness of breath, life threatening emergency, suicidal or homicidal thoughts you must seek medical attention immediately by calling 911 or calling your MD immediately  if symptoms less severe.  You Must read complete instructions/literature along with all the possible adverse reactions/side effects for all the Medicines you take and that have been prescribed to you. Take any new Medicines after you have completely understood and accpet all the possible adverse reactions/side effects.   Increase activity slowly   Complete by: As directed    MyChart COVID-19 home monitoring program   Complete by: Apr 04, 2021    Is the patient willing to use the MyChart Mobile App for home monitoring?: Yes   Temperature monitoring   Complete by: Apr 04, 2021    After how many days would you like to receive a notification of this patient's flowsheet entries?: 1       Discharge Medications   Allergies as of 04/04/2021       Reactions   Codeine Hives        Medication List     STOP taking these medications    azithromycin 250 MG tablet Commonly known as: ZITHROMAX       TAKE these medications    amLODipine 10 MG tablet Commonly  known as: NORVASC Take 1 tablet (10 mg total) by mouth daily.   aspirin EC 81 MG tablet Take 1 tablet (81 mg total) by mouth daily.  Swallow whole.   citalopram 20 MG tablet Commonly known as: CELEXA Take 1 tablet (20 mg total) by mouth daily.   cyanocobalamin 1000 MCG tablet Take 1 tablet (1,000 mcg total) by mouth daily.   ferrous sulfate 325 (65 FE) MG EC tablet Take 1 tablet (325 mg total) by mouth daily with breakfast.   finasteride 5 MG tablet Commonly known as: PROSCAR Take 1 tablet (5 mg total) by mouth daily. Start taking on: April 05, 2021   Melatonin 10 MG Tabs Take 10 mg by mouth at bedtime.   metoprolol tartrate 25 MG tablet Commonly known as: LOPRESSOR Take 1 tablet (25 mg total) by mouth 2 (two) times daily.   nicotine 21 mg/24hr patch Commonly known as: NICODERM CQ - dosed in mg/24 hours Place 1 patch (21 mg total) onto the skin daily. Start taking on: April 05, 2021   omeprazole 20 MG capsule Commonly known as: PRILOSEC TAKE 1 CAPSULE BY MOUTH EVERY DAY   pravastatin 20 MG tablet Commonly known as: PRAVACHOL Take 1 tablet (20 mg total) by mouth daily.   sucralfate 1 g tablet Commonly known as: CARAFATE TAKE 1 TABLET (1 G TOTAL) BY MOUTH 4 (FOUR) TIMES DAILY - WITH MEALS AND AT BEDTIME.   tamsulosin 0.4 MG Caps capsule Commonly known as: FLOMAX Take 1 capsule (0.4 mg total) by mouth daily.               Durable Medical Equipment  (From admission, onward)           Start     Ordered   03/31/21 1359  For home use only DME Walker rolling  Once       Comments: 5 wheel  Question Answer Comment  Walker: With 5 Inch Wheels   Patient needs a walker to treat with the following condition Weakness      03/31/21 1358             Follow-up Information     Marjie Skiff, NP. Schedule an appointment as soon as possible for a visit in 1 week(s).   Specialty: Nurse Practitioner Contact information: [address] [place]  [address] [phone]         Gabriel Cirri, NP .   Specialties: Nurse Practitioner, Family Medicine Contact information: [address] [place] [address] [phone]                 Major procedures and Radiology Reports - PLEASE review detailed and final reports thoroughly  -       DG Chest 2 View  Result Date: 03/27/2021 CLINICAL DATA:  Cough. EXAM: CHEST - 2 VIEW COMPARISON:  September 12, 2011 FINDINGS: No pneumothorax. The cardiomediastinal silhouette is stable. Bibasilar pulmonary infiltrates, right greater than left identified. No other acute abnormalities. IMPRESSION: Bibasilar infiltrates consistent with developing pneumonia or aspiration. Recommend follow-up to resolution. No other changes. Electronically Signed   By: Gerome Sam III M.D.   On: 03/27/2021 10:00   DG Abd 1 View  Result Date: 03/28/2021 CLINICAL DATA:  Nausea and abdominal pain EXAM: ABDOMEN - 1 VIEW COMPARISON:  CT from the previous day. FINDINGS: Scattered large and small bowel gas is noted. Postsurgical changes are noted on the right. Multiple ingested tablets are seen. No bony abnormality is noted. IMPRESSION: No acute abnormality noted. Electronically Signed   By: Alcide Clever M.D.   On: 03/28/2021 17:13   CT ABDOMEN PELVIS W CONTRAST  Result Date: 03/27/2021 CLINICAL DATA:  Dizziness and weakness.  Positive COVID. EXAM: CT ABDOMEN AND PELVIS WITH CONTRAST TECHNIQUE: Multidetector CT imaging of the abdomen and pelvis was performed using the standard protocol following bolus administration of intravenous contrast. CONTRAST:  75mL OMNIPAQUE IOHEXOL 350 MG/ML SOLN COMPARISON:  None. FINDINGS: Lower chest: Branching nodular pattern in the RIGHT lower lobe (so-called tree in bud) consistent with RIGHT lower lobe pulmonary infection. No focal consolidation. Hepatobiliary: Benign vascular enhancement along the anterior RIGHT hepatic lobe (image 10/2). No worrisome hepatic lesion. Postcholecystectomy Pancreas:  Pancreas is normal. No ductal dilatation. No pancreatic inflammation. Spleen: Spleen is irregular lobular contour. Adrenals/urinary tract: Adrenal glands normal. There is mild persistent hydronephrosis and proximal hydroureter on the RIGHT which is improved from prior. Interval clearing of the mid RIGHT ureteral calculus seen on comparison CT exam. No ureteral calculi remain. No bladder calculi. LEFT kidney and ureter normal. Stomach/Bowel: Stomach and small bowel normal. Colon and rectosigmoid colon normal. Vascular/Lymphatic: Abdominal aorta is normal caliber with atherosclerotic calcification. There is no retroperitoneal or periportal lymphadenopathy. No pelvic lymphadenopathy. Reproductive: Prostate unremarkable. Other: No free fluid. Musculoskeletal: No aggressive osseous lesion. IMPRESSION: 1. Tree-in-bud pattern in the RIGHT lower lobe consistent with pulmonary bronchopneumonia. 2. Mild residual hydronephrosis on the RIGHT related to prior RIGHT obstructing mid ureteral calculus. No obstructing calculus now present within the RIGHT ureter. 3. No clear explanation for LEFT lower quadrant pain as reported. Electronically Signed   By: Genevive BiStewart  Edmunds M.D.   On: 03/27/2021 11:46   DG Chest Port 1 View  Result Date: 03/31/2021 CLINICAL DATA:  SOB. Hx of recent COVID. EXAM: PORTABLE CHEST - 1 VIEW COMPARISON:  Radiograph 03/27/2021, CT 05/11/2020 FINDINGS: Persistent coarse airspace opacities at the right lung base, and to a lesser degree in the left infrahilar region. Lungs otherwise clear. Heart size and mediastinal contours are within normal limits. No effusion.  No pneumothorax. Visualized bones unremarkable. IMPRESSION: Stable left infrahilar and right basilar scattered airspace opacities Electronically Signed   By: Corlis Leak  Hassell M.D.   On: 03/31/2021 08:53   DG Abd Portable 1V  Result Date: 03/31/2021 CLINICAL DATA:  Nausea vomiting. EXAM: PORTABLE ABDOMEN - 1 VIEW COMPARISON:  03/30/2021 FINDINGS: No  gaseous small bowel dilatation. Diffuse gaseous distention of colon again noted, similar to prior. Numerous surgical clips noted in the abdomen, right greater than left with right lower abdominal staple line evident. Visualized bony anatomy unremarkable. IMPRESSION: Stable exam. Mild diffuse gaseous distention of colon. No findings to suggest bowel obstruction. Electronically Signed   By: Kennith CenterEric  Mansell M.D.   On: 03/31/2021 08:52   DG Abd Portable 1V  Result Date: 03/30/2021 CLINICAL DATA:  Abdominal pain.  COVID. EXAM: PORTABLE ABDOMEN - 1 VIEW COMPARISON:  03/28/2021 FINDINGS: The bowel gas pattern is normal. No dilated loops of small bowel. Gas noted within normal caliber colon. Numerous surgical clips noted within both sides of abdomen. Suture chain identified within the right lower quadrant. No radio-opaque calculi or other significant radiographic abnormality are seen. IMPRESSION: Nonobstructive bowel gas pattern. Electronically Signed   By: Signa Kellaylor  Stroud M.D.   On: 03/30/2021 12:49    Micro Results    Recent Results (from the past 240 hour(s))  C Difficile Quick Screen w PCR reflex     Status: None   Collection Time: 03/28/21 10:00 AM   Specimen: STOOL  Result Value Ref Range Status   C Diff antigen NEGATIVE NEGATIVE Final   C Diff toxin NEGATIVE NEGATIVE Final   C Diff interpretation No  C. difficile detected.  Final    Comment: Performed at [facility], [address] [place], [place] [address]    Today   Subjective    Keidan Aumiller today has no headache,no chest abdominal pain,no new weakness tingling or numbness, feels much better wants to go home today.   Objective   Blood pressure 140/90, pulse 80, temperature 97.9 F (36.6 C), temperature source Oral, resp. rate 18, height  (1.753 m), weight 63.5 kg, SpO2 98 %.   Intake/Output Summary (Last 24 hours) at 04/04/2021 0941 Last data filed at 04/03/2021 1300 Gross per 24 hour  Intake 0 ml  Output --   Net 0 ml    Exam  Awake Alert, No new F.N deficits, Normal affect [place].AT,PERRAL Supple Neck,No JVD, No cervical lymphadenopathy appriciated.  Symmetrical Chest wall movement, Good air movement bilaterally, CTAB RRR,No Gallops,Rubs or new Murmurs, No Parasternal Heave +ve B.Sounds, Abd Soft, Non tender, No organomegaly appriciated, No rebound -guarding or rigidity. No Cyanosis, Clubbing or edema, No new Rash or bruise   Data Review   CBC w Diff:  Lab Results  Component Value Date   WBC 9.7 04/04/2021   HGB 14.7 04/04/2021   HGB 11.1 (L) 09/15/2020   HCT 42.1 04/04/2021   HCT 34.9 (L) 09/15/2020   PLT 450 (H) 04/04/2021   PLT 401 09/15/2020   LYMPHOPCT 26 04/04/2021   LYMPHOPCT 28.3 09/19/2011   MONOPCT 9 04/04/2021   MONOPCT 9.5 09/19/2011   EOSPCT 2 04/04/2021   EOSPCT 1.3 09/19/2011   BASOPCT 0 04/04/2021   BASOPCT 0.8 09/19/2011    CMP:  Lab Results  Component Value Date   NA 139 04/04/2021   NA 142 09/15/2020   NA 140 09/21/2011   K 2.9 (L) 04/04/2021   K 4.3 09/21/2011   CL 105 04/04/2021   CL 106 09/21/2011   CO2 25 04/04/2021   CO2 24 09/21/2011   BUN 10 04/04/2021   BUN 10 09/15/2020   BUN 13 09/21/2011   CREATININE 0.86 04/04/2021   CREATININE 0.94 09/21/2011   PROT 6.0 (L) 04/04/2021   PROT 6.7 09/01/2020   PROT 7.2 09/21/2011   ALBUMIN 3.3 (L) 04/04/2021   ALBUMIN 4.3 09/01/2020   ALBUMIN 3.9 09/21/2011   BILITOT 0.9 04/04/2021   BILITOT 0.2 09/01/2020   BILITOT 0.3 09/21/2011   ALKPHOS 75 04/04/2021   ALKPHOS 87 09/21/2011   AST 14 (L) 04/04/2021   AST 20 09/21/2011   ALT 22 04/04/2021   ALT 24 09/21/2011  .   Total Time in preparing paper work, data evaluation and todays exam - 35 minutes  Susa Raring M.D on 04/04/2021 at 9:41 AM  [facility]

## 2021-04-04 NOTE — Discharge Instructions (Signed)
Follow with Primary MD Aura Dials T, NP in 7 days   Get CBC, CMP, 2 view Chest X ray -  checked next visit within 1 week by Primary MD   Activity: As tolerated with Full fall precautions use walker/cane & assistance as needed  Disposition Home   Diet: Heart Healthy   Special Instructions: If you have smoked or chewed Tobacco  in the last 2 yrs please stop smoking, stop any regular Alcohol  and or any Recreational drug use.  On your next visit with your primary care physician please Get Medicines reviewed and adjusted.  Please request your Prim.MD to go over all Hospital Tests and Procedure/Radiological results at the follow up, please get all Hospital records sent to your Prim MD by signing hospital release before you go home.  If you experience worsening of your admission symptoms, develop shortness of breath, life threatening emergency, suicidal or homicidal thoughts you must seek medical attention immediately by calling 911 or calling your MD immediately  if symptoms less severe.  You Must read complete instructions/literature along with all the possible adverse reactions/side effects for all the Medicines you take and that have been prescribed to you. Take any new Medicines after you have completely understood and accpet all the possible adverse reactions/side effects.

## 2021-04-04 NOTE — Care Management Important Message (Signed)
Important Message  Patient Details  Name: Mark [name] MRN: [id number] Date of Birth: 02-11-1961   Medicare Important Message Given:  Yes     [name] 04/04/2021, 11:25 AM

## 2021-04-04 NOTE — Progress Notes (Signed)
Occupational Therapy Treatment Patient Details Name: Mark [name] MRN: [id number] DOB: June 15, 1961 Today's Date: 04/04/2021    History of present illness Pt admitted for dizziness with complaints of weakness and dizziness. History includes depression, HTN, and old CVA.   OT comments  Mark [name] was able to engage in ADLs and functional mobility tasks today without physical assistance or AE, no loss of balance observed, and pt displayed good safety awareness, ambulating slowly and carefully in room. Pt able to perform bed mobility, transfers, toileting, grooming in standing, LB dressing, all with IND or Mod I (extra time required). Pt continues to endorse mild/mod abdominal discomfort and had diarrhea this AM. Discussed home safety, pt feels confident to return home with HHOT. This recommendation remains appropriate.    Follow Up Recommendations  Home health OT;Supervision - Intermittent    Equipment Recommendations  Tub/shower seat    Recommendations for Other Services      Precautions / Restrictions Precautions Precautions: Fall Precaution Comments: Mod fall risk Restrictions Weight Bearing Restrictions: No       Mobility Bed Mobility                    Transfers Overall transfer level: Modified independent Equipment used: None Transfers: Sit to/from [employer] Sit to Stand: Modified independent (Device/Increase time) Stand pivot transfers: Modified independent (Device/Increase time)       General transfer comment: increased time    Balance Overall balance assessment: Modified Independent Sitting-balance support: Feet supported;No upper extremity supported Sitting balance-Leahy Scale: Good     Standing balance support: No upper extremity supported Standing balance-Leahy Scale: Good Standing balance comment: ambulates w/in room, bathroom, no AE, no LOB, but extra time required -- pt uses slow, careful walking pace.                            ADL either performed or assessed with clinical judgement   ADL Overall ADL's : Modified independent Eating/Feeding: Independent   Grooming: Wash/dry hands;Standing;Independent               Lower Body Dressing: Independent;Sitting/lateral leans Lower Body Dressing Details (indicate cue type and reason): to don socks Toilet Transfer: Modified Independent Toilet Transfer Details (indicate cue type and reason): extra time required Toileting- Clothing Manipulation and Hygiene: Independent       Functional mobility during ADLs: Modified independent General ADL Comments: extra time required, no physical assist, no DME     Vision Patient Visual Report: No change from baseline     Perception     Praxis      Cognition Arousal/Alertness: Awake/alert Behavior During Therapy: WFL for tasks assessed/performed Overall Cognitive Status: Within Functional Limits for tasks assessed                                 General Comments: pleasant        Exercises Other Exercises Other Exercises: toileting, grooming, LB dressing, mobility, STS. Discussion re home safety, DC recs.   Shoulder Instructions       General Comments      Pertinent Vitals/ Pain       Faces Pain Scale: Hurts little more Pain Location: abdominal pain Pain Descriptors / Indicators: Discomfort Pain Intervention(s): Limited activity within patient's tolerance;Monitored during session  Home Living  Prior Functioning/Environment              Frequency  Min 2X/week        Progress Toward Goals  OT Goals(current goals can now be found in the care plan section)  Progress towards OT goals: Progressing toward goals  Acute Rehab OT Goals Patient Stated Goal: to feel better OT Goal Formulation: With patient Time For Goal Achievement: 04/12/21 Potential to Achieve Goals: Good  Plan Discharge plan remains  appropriate;Frequency remains appropriate    Co-evaluation                 AM-PAC OT "6 Clicks" Daily Activity     Outcome Measure   Help from another person eating meals?: None Help from another person taking care of personal grooming?: None Help from another person toileting, which includes using toliet, bedpan, or urinal?: None Help from another person bathing (including washing, rinsing, drying)?: A Little Help from another person to put on and taking off regular upper body clothing?: None Help from another person to put on and taking off regular lower body clothing?: None 6 Click Score: 23    End of Session    OT Visit Diagnosis: Muscle weakness (generalized) (M62.81);Unsteadiness on feet (R26.81)   Activity Tolerance Patient tolerated treatment well   Patient Left in chair;with call bell/phone within reach   Nurse Communication          Time: 8341-9622 OT Time Calculation (min): 16 min  Charges: OT General Charges $OT Visit: 1 Visit OT Treatments $Self Care/Home Management : 8-22 mins  Mark Craver, PhD, MS, OTR/L 04/04/21, 9:15 AM

## 2021-04-04 NOTE — TOC Transition Note (Signed)
Transition of Care [facility]) - CM/SW Discharge Note   Patient Details  Name: FELIPE PALUCH MRN: [id number] Date of Birth: 11/03/1960  Transition of Care [facility]) CM/SW Contact:  Allayne Butcher, RN Phone Number: 04/04/2021, 9:41 AM   Clinical Narrative:    Foley catheter discontinued yesterday.  Patient is not having difficulty urinating.  Patient's son will pick him up after his potassium has been replaced today.  [facility] notified of discharge for today.   Final next level of care: Home w Home Health Services Barriers to Discharge: Barriers Resolved   Patient Goals and CMS Choice Patient states their goals for this hospitalization and ongoing recovery are:: Family okay with patient going home with home health services CMS Medicare.gov Compare Post Acute Care list provided to:: Patient Represenative (must comment) Choice offered to / list presented to : Spouse, Adult Children  Discharge Placement                       Discharge Plan and Services   Discharge Planning Services: CM Consult Post Acute Care Choice: Home Health          DME Arranged: N/A DME Agency: NA       HH Arranged: PT, OT, Social Work [employer] Agency: [facility] Date [facility] Agency Contacted: 04/04/21 Time HH Agency Contacted: [phone] Representative spoke with at [facility] Agency: Kandee Keen  Social Determinants of Health (SDOH) Interventions     Readmission Risk Interventions No flowsheet data found.

## 2021-04-04 NOTE — Progress Notes (Signed)
Went through discharge paper with pt, pt left POV with his son, pt refused to take incentive spirometer and flutter valve with him

## 2021-04-05 ENCOUNTER — Ambulatory Visit: Payer: Medicare HMO | Admitting: Physical Therapy

## 2021-04-06 ENCOUNTER — Ambulatory Visit: Payer: Medicare HMO | Admitting: Nurse Practitioner

## 2021-04-07 ENCOUNTER — Ambulatory Visit: Payer: Medicare HMO | Admitting: Physical Therapy

## 2021-04-12 ENCOUNTER — Ambulatory Visit: Payer: Medicare HMO | Admitting: Physical Therapy

## 2021-04-15 ENCOUNTER — Ambulatory Visit: Payer: Medicare HMO | Admitting: Physical Therapy

## 2021-04-19 ENCOUNTER — Ambulatory Visit: Payer: Medicare HMO | Admitting: Physical Therapy

## 2021-04-21 ENCOUNTER — Ambulatory Visit: Payer: Medicare HMO | Admitting: Physical Therapy

## 2021-04-25 ENCOUNTER — Telehealth: Payer: Self-pay

## 2021-04-25 ENCOUNTER — Telehealth: Payer: Self-pay | Admitting: Licensed Clinical Social Worker

## 2021-04-25 NOTE — Telephone Encounter (Signed)
    Clinical Social Work  Chronic Care Management   Phone Outreach    04/25/2021 Name: Mark [name] MRN: [id number] DOB: 07-27-1961  Mark [name] is a 60 y.o. year old male who is a primary care patient of Cannady, Dorie Rank, NP .   Reason for referral: Mental Health Counseling and Resources and Grief Counseling.    F/U phone call today to assess needs, progress and barriers with care plan goals.   Telephone outreach was unsuccessful. Unable to leave a HIPPA compliant phone message due to voice mail not set up.  Plan:CCM LCSW will wait for return call. If no return call is received, Appointment was rescheduled with CCM    Review of patient status, including review of consultants reports, relevant laboratory and other test results, and collaboration with appropriate care team members and the patient's provider was performed as part of comprehensive patient evaluation and provision of care management services.    Jenel Lucks, MSW, LCSW [facility] Care [facility] [place]  [facility] [email or web address] Phone [phone] 3:13 PM

## 2021-04-26 ENCOUNTER — Ambulatory Visit: Payer: Medicare HMO | Admitting: Physical Therapy

## 2021-04-28 ENCOUNTER — Ambulatory Visit: Payer: Medicare HMO | Admitting: Physical Therapy

## 2021-04-30 ENCOUNTER — Encounter: Payer: Self-pay | Admitting: Nurse Practitioner

## 2021-04-30 DIAGNOSIS — Z8616 Personal history of COVID-19: Secondary | ICD-10-CM | POA: Insufficient documentation

## 2021-05-01 IMAGING — CR DG ABDOMEN 1V
1 series · 1 of 1 positions shown · non-contrast
Comparison: CT of the abdomen and pelvis on 05/08/2019

CLINICAL DATA: Pre op for lithotripsy of right sided kidney stone,
pain nausea this a.m.,

EXAM:
ABDOMEN - 1 VIEW

[t abdomen supine]
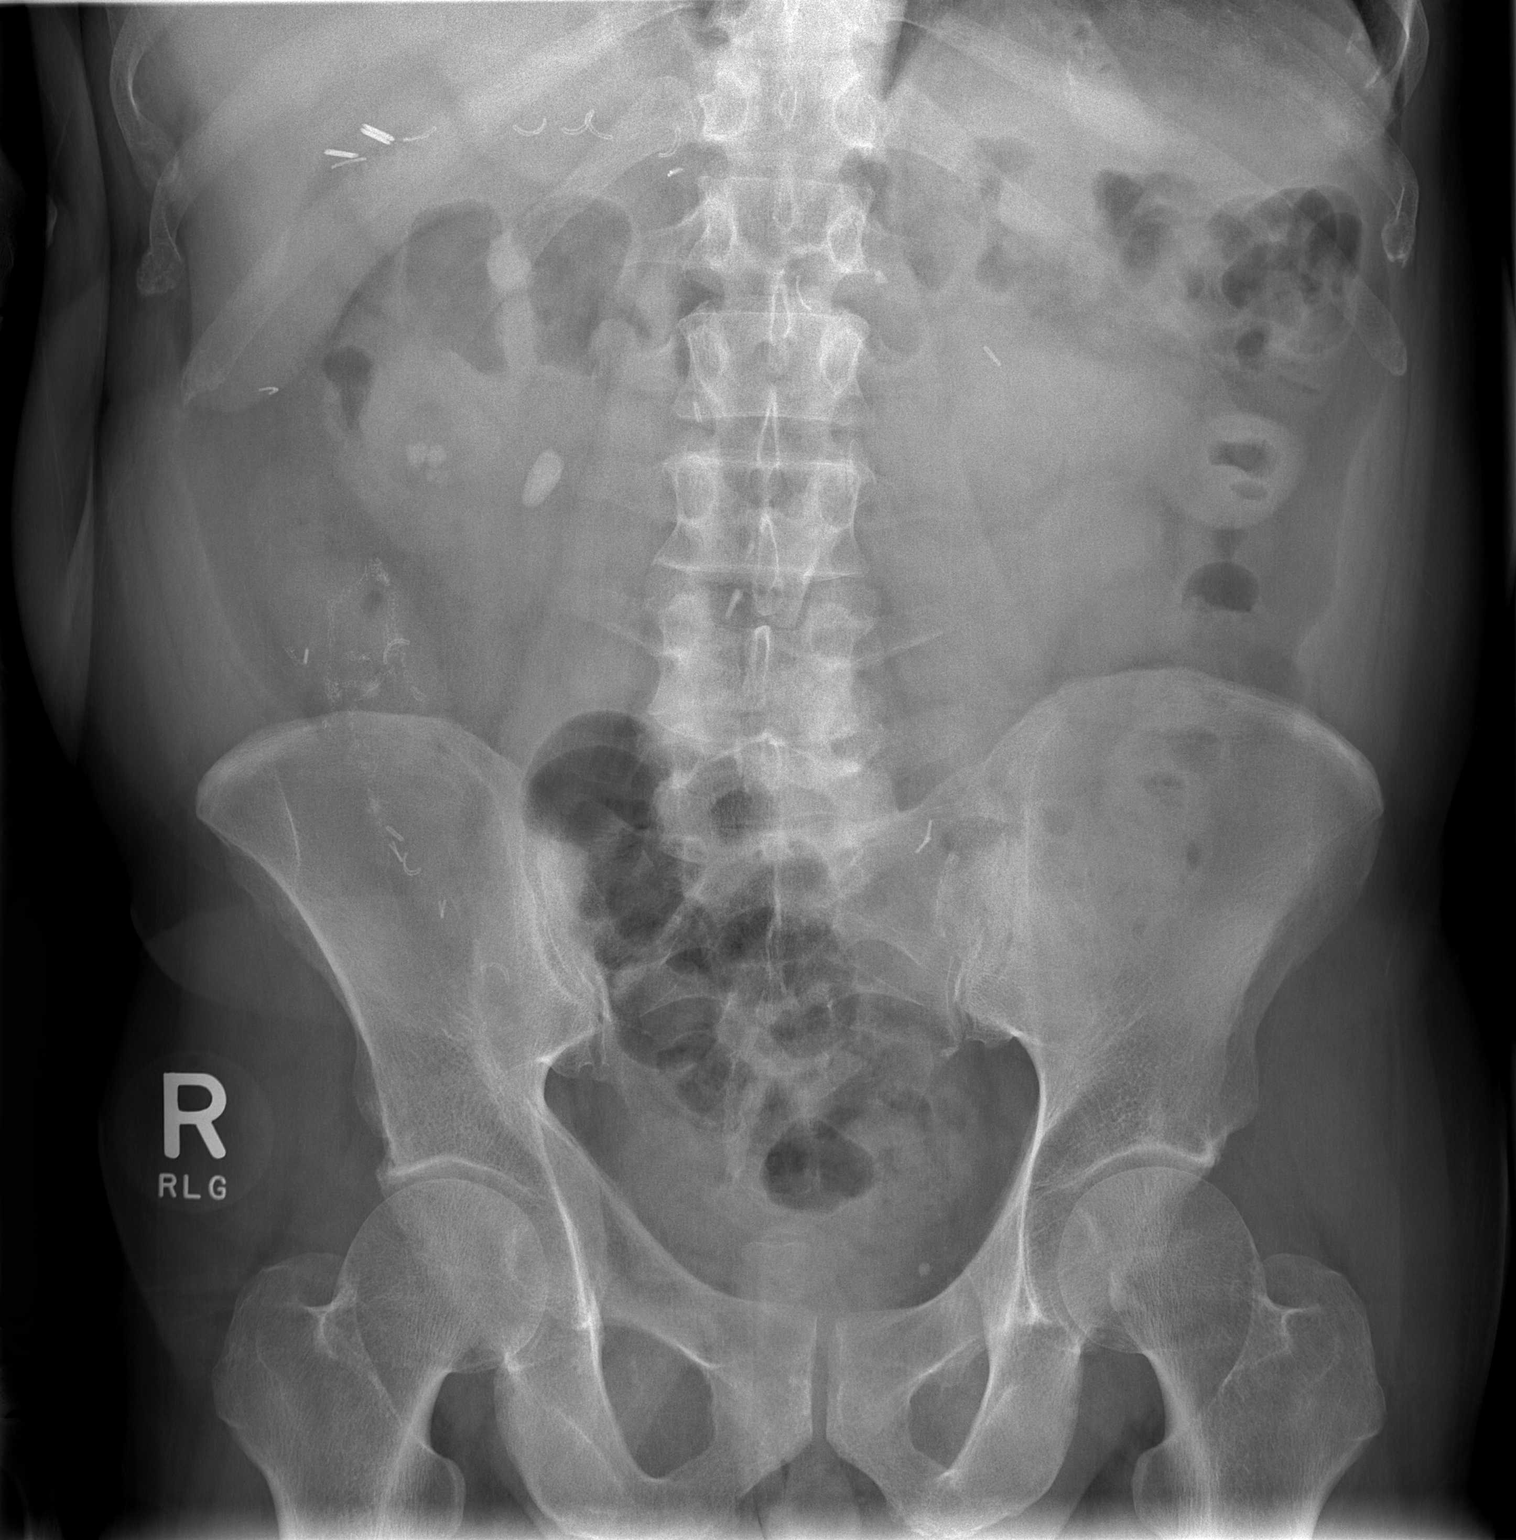

[1 of 1 positions shown; findings below may reference images not displayed]

FINDINGS: Bowel gas pattern is nonobstructive. Bowel sutures are identified in
the RIGHT LOWER QUADRANT. Note is made of numerous calcifications in
the LOWER pole of the RIGHT kidney, largest measuring approximately
5 millimeters in diameter. The RIGHT ureteropelvic junction calculus
measures 1.7 x 0.9 centimeters, unchanged compared with the previous
CT exam. Surgical clips are identified in the UPPER abdomen.
IMPRESSION: 1. Stable appearance of numerous calcifications in the RIGHT kidney.
2. Stable RIGHT ureteropelvic junction calculus.

## 2021-05-02 ENCOUNTER — Inpatient Hospital Stay: Payer: Medicare HMO | Admitting: Nurse Practitioner

## 2021-05-03 ENCOUNTER — Ambulatory Visit: Payer: Medicare HMO | Admitting: Physical Therapy

## 2021-05-05 ENCOUNTER — Ambulatory Visit: Payer: Medicare HMO | Admitting: Physical Therapy

## 2021-05-06 ENCOUNTER — Other Ambulatory Visit: Payer: Self-pay | Admitting: Nurse Practitioner

## 2021-05-06 ENCOUNTER — Telehealth: Payer: Medicare HMO

## 2021-05-06 ENCOUNTER — Telehealth: Payer: Self-pay | Admitting: Licensed Clinical Social Worker

## 2021-05-06 NOTE — Telephone Encounter (Signed)
    Clinical Social Work  Care Management   Phone Outreach    05/06/2021 Name: SHANTA DORVIL MRN: [id number] DOB: 01/15/1961  DREYTON ROESSNER is a 60 y.o. year old male who is a primary care patient of Cannady, Dorie Rank, NP .   Reason for referral: Mental Health Counseling and Resources.    F/U phone call today to assess needs, progress and barriers with care plan goals.   2nd unsuccessful telephone outreach attempt.  If unable to reach patient by phone on the 3rd attempt, will discontinue outreach calls but will be available at any time to provide services.   Plan:Will reach out to patient again in the next 30 days .   Review of patient status, including review of consultants reports, relevant laboratory and other test results, and collaboration with appropriate care team members and the patient's provider was performed as part of comprehensive patient evaluation and provision of care management services.     Jenel Lucks, MSW, LCSW [facility] [place]  [facility] [email or web address] Phone [phone] 10:33 AM

## 2021-05-10 ENCOUNTER — Ambulatory Visit: Payer: Medicare HMO | Admitting: Physical Therapy

## 2021-05-12 ENCOUNTER — Ambulatory Visit: Payer: Medicare HMO | Admitting: Physical Therapy

## 2021-05-17 ENCOUNTER — Ambulatory Visit: Payer: Medicare HMO | Admitting: Physical Therapy

## 2021-05-18 ENCOUNTER — Other Ambulatory Visit: Payer: Self-pay | Admitting: Nurse Practitioner

## 2021-05-18 NOTE — Telephone Encounter (Signed)
Requested medication (s) are due for refill today - no  Requested medication (s) are on the active medication list -yes  Future visit scheduled -yes  Last refill: 1 week  Notes to clinic: Request changes to Rx- request 90 day supply- requires changes from original Rx  Requested Prescriptions  Pending Prescriptions Disp Refills   sucralfate (CARAFATE) 1 g tablet [Pharmacy Med Name: SUCRALFATE 1 GM TABLET] 360 tablet 1    Sig: TAKE 1 TABLET (1 G TOTAL) BY MOUTH 4 (FOUR) TIMES DAILY - WITH MEALS AND AT BEDTIME.     Gastroenterology: Antiacids Passed - 05/18/2021  8:57 AM      Passed - Valid encounter within last 12 months    Recent Outpatient Visits           1 month ago COVID-19   [facility] Larae Grooms, NP   1 month ago Upper respiratory tract infection, unspecified type   [facility] [place], Lauren A, NP   4 months ago Grief reaction   [facility] [place], [place] T, NP   5 months ago Anxiety and depression   [facility] [place], Dorie Rank, NP   6 months ago Grief reaction   [facility] [place], Corrie Dandy T, NP       Future Appointments             In 10 months [facility], [facility]                Requested Prescriptions  Pending Prescriptions Disp Refills   sucralfate (CARAFATE) 1 g tablet [Pharmacy Med Name: SUCRALFATE 1 GM TABLET] 360 tablet 1    Sig: TAKE 1 TABLET (1 G TOTAL) BY MOUTH 4 (FOUR) TIMES DAILY - WITH MEALS AND AT BEDTIME.     Gastroenterology: Antiacids Passed - 05/18/2021  8:57 AM      Passed - Valid encounter within last 12 months    Recent Outpatient Visits           1 month ago COVID-19   [facility] Larae Grooms, NP   1 month ago Upper respiratory tract infection, unspecified type   [facility] [place], Lauren A, NP   4 months ago Grief reaction   [facility] [place], Dorie Rank, NP   5 months ago Anxiety and  depression   [facility] [place], Dorie Rank, NP   6 months ago Grief reaction   [facility] [place], Dorie Rank, NP       Future Appointments             In 10 months [facility], [facility]

## 2021-05-19 ENCOUNTER — Ambulatory Visit: Payer: Medicare HMO | Admitting: Physical Therapy

## 2021-05-24 ENCOUNTER — Ambulatory Visit: Payer: Medicare HMO | Admitting: Physical Therapy

## 2021-05-26 ENCOUNTER — Ambulatory Visit: Payer: Medicare HMO | Admitting: Physical Therapy

## 2021-05-31 ENCOUNTER — Ambulatory Visit: Payer: Medicare HMO | Admitting: Physical Therapy

## 2021-06-02 ENCOUNTER — Ambulatory Visit: Payer: Medicare HMO | Admitting: Physical Therapy

## 2021-06-07 ENCOUNTER — Ambulatory Visit: Payer: Medicare HMO | Admitting: Physical Therapy

## 2021-06-09 ENCOUNTER — Ambulatory Visit: Payer: Medicare HMO | Admitting: Physical Therapy

## 2021-06-14 ENCOUNTER — Ambulatory Visit: Payer: Medicare HMO | Admitting: Physical Therapy

## 2021-06-16 ENCOUNTER — Ambulatory Visit: Payer: Medicare HMO | Admitting: Physical Therapy

## 2021-06-21 ENCOUNTER — Ambulatory Visit: Payer: Medicare HMO | Admitting: Physical Therapy

## 2021-06-23 ENCOUNTER — Ambulatory Visit: Payer: Medicare HMO | Admitting: Physical Therapy

## 2021-06-28 ENCOUNTER — Ambulatory Visit: Payer: Medicare HMO | Admitting: Physical Therapy

## 2021-06-30 ENCOUNTER — Ambulatory Visit: Payer: Medicare HMO | Admitting: Physical Therapy

## 2021-07-05 ENCOUNTER — Ambulatory Visit: Payer: Medicare HMO | Admitting: Physical Therapy

## 2021-07-12 ENCOUNTER — Ambulatory Visit: Payer: Medicare HMO | Admitting: Physical Therapy

## 2021-07-14 ENCOUNTER — Ambulatory Visit: Payer: Medicare HMO | Admitting: Physical Therapy

## 2021-07-19 ENCOUNTER — Ambulatory Visit: Payer: Medicare HMO | Admitting: Physical Therapy

## 2021-07-21 ENCOUNTER — Ambulatory Visit: Payer: Medicare HMO | Admitting: Physical Therapy

## 2021-07-26 ENCOUNTER — Ambulatory Visit: Payer: Medicare HMO | Admitting: Physical Therapy

## 2021-07-28 ENCOUNTER — Ambulatory Visit: Payer: Medicare HMO | Admitting: Physical Therapy

## 2021-08-02 ENCOUNTER — Ambulatory Visit: Payer: Medicare HMO | Admitting: Physical Therapy

## 2021-08-04 ENCOUNTER — Ambulatory Visit: Payer: Medicare HMO | Admitting: Physical Therapy

## 2021-08-09 ENCOUNTER — Ambulatory Visit: Payer: Medicare HMO | Admitting: Physical Therapy

## 2021-08-11 ENCOUNTER — Ambulatory Visit: Payer: Medicare HMO | Admitting: Physical Therapy

## 2021-09-10 ENCOUNTER — Other Ambulatory Visit: Payer: Self-pay | Admitting: Nurse Practitioner

## 2021-09-10 NOTE — Telephone Encounter (Signed)
Last RF written for 15 months pt should have enough until April 2023  Requested Prescriptions  Pending Prescriptions Disp Refills   citalopram (CELEXA) 20 MG tablet [Pharmacy Med Name: CITALOPRAM HBR 20 MG TABLET] 90 tablet     Sig: TAKE 1 [place]     Psychiatry:  Antidepressants - SSRI Passed - 09/10/2021 10:32 AM      Passed - Completed PHQ-2 or PHQ-9 in the last 360 days      Passed - Valid encounter within last 6 months    Recent Outpatient Visits          5 months ago [place], NP   5 months ago Upper respiratory tract infection, unspecified type   [place], Lauren A, NP   8 months ago Grief reaction   [place], [place] T, NP   9 months ago Anxiety and depression   [place], Barbaraann Faster, NP   9 months ago Grief reaction   [place], Henrine Screws T, NP      Future Appointments            In 6 months [employer], [facility]

## 2021-09-26 ENCOUNTER — Telehealth: Payer: Self-pay | Admitting: Acute Care

## 2021-09-26 ENCOUNTER — Ambulatory Visit: Payer: Medicare HMO

## 2021-09-26 NOTE — Telephone Encounter (Signed)
Attempted to contact to schedule annual LDCT.  Received busy tone and unable to leave message

## 2021-11-12 DIAGNOSIS — E538 Deficiency of other specified B group vitamins: Secondary | ICD-10-CM | POA: Insufficient documentation

## 2021-11-12 DIAGNOSIS — N400 Enlarged prostate without lower urinary tract symptoms: Secondary | ICD-10-CM | POA: Insufficient documentation

## 2021-11-12 NOTE — Patient Instructions (Addendum)
Social Security Office closest to [place]:  ? [place], [place], [place] [address] ? [phone] ? ?NUMBER TO CALL:  Call [phone] (TTY [phone]) from 8:00 a.m. to 7:00 p.m., Monday through Friday, to apply by phone. ? ?DASH Eating Plan ?DASH stands for Dietary Approaches to Stop Hypertension. The DASH eating plan is a healthy eating plan that has been shown to: ?Reduce high blood pressure (hypertension). ?Reduce your risk for type 2 diabetes, heart disease, and stroke. ?Help with weight loss. ?What are tips for following this plan? ?Reading food labels ?Check food labels for the amount of salt (sodium) per serving. Choose foods with less than 5 percent of the Daily Value of sodium. Generally, foods with less than 300 milligrams (mg) of sodium per serving fit into this eating plan. ?To find whole grains, look for the word "whole" as the first word in the ingredient list. ?Shopping ?Buy products labeled as "low-sodium" or "no salt added." ?Buy fresh foods. Avoid canned foods and pre-made or frozen meals. ?Cooking ?Avoid adding salt when cooking. Use salt-free seasonings or herbs instead of table salt or sea salt. Check with your health care provider or pharmacist before using salt substitutes. ?Do not fry foods. Cook foods using healthy methods such as baking, boiling, grilling, roasting, and broiling instead. ?Cook with heart-healthy oils, such as olive, canola, avocado, soybean, or sunflower oil. ?Meal planning ? ?Eat a balanced diet that includes: ?4 or more servings of fruits and 4 or more servings of vegetables each day. Try to fill one-half of your plate with fruits and vegetables. ?6-8 servings of whole grains each day. ?Less than 6 oz (170 g) of lean meat, poultry, or fish each day. A 3-oz (85-g) serving of meat is about the same size as a deck of cards. One egg equals 1 oz (28 g). ?2-3 servings of low-fat dairy each day. One serving is 1 cup (237 mL). ?1 serving of nuts, seeds, or beans 5  times each week. ?2-3 servings of heart-healthy fats. Healthy fats called omega-3 fatty acids are found in foods such as walnuts, flaxseeds, fortified milks, and eggs. These fats are also found in cold-water fish, such as sardines, salmon, and mackerel. ?Limit how much you eat of: ?Canned or prepackaged foods. ?Food that is high in trans fat, such as some fried foods. ?Food that is high in saturated fat, such as fatty meat. ?Desserts and other sweets, sugary drinks, and other foods with added sugar. ?Full-fat dairy products. ?Do not salt foods before eating. ?Do not eat more than 4 egg yolks a week. ?Try to eat at least 2 vegetarian meals a week. ?Eat more home-cooked food and less restaurant, buffet, and fast food. ?Lifestyle ?When eating at a restaurant, ask that your food be prepared with less salt or no salt, if possible. ?If you drink alcohol: ?Limit how much you use to: ?0-1 drink a day for women who are not pregnant. ?0-2 drinks a day for men. ?Be aware of how much alcohol is in your drink. In the [place] one drink equals one 12 oz bottle of beer (355 mL), one 5 oz glass of wine (148 mL), or one 1? oz glass of hard liquor (44 mL). ?General information ?Avoid eating more than 2,300 mg of salt a day. If you have hypertension, you may need to reduce your sodium intake to 1,500 mg a day. ?Work with your health care provider to maintain a healthy body weight or to lose weight. Ask what an  ideal weight is for you. ?Get at least 30 minutes of exercise that causes your heart to beat faster (aerobic exercise) most days of the week. Activities may include walking, swimming, or biking. ?Work with your health care provider or dietitian to adjust your eating plan to your individual calorie needs. ?What foods should I eat? ?Fruits ?All fresh, dried, or frozen fruit. Canned fruit in natural juice (without added sugar). ?Vegetables ?Fresh or frozen vegetables (raw, steamed, roasted, or grilled). Low-sodium or reduced-sodium  tomato and vegetable juice. Low-sodium or reduced-sodium tomato sauce and tomato paste. Low-sodium or reduced-sodium canned vegetables. ?Grains ?Whole-grain or whole-wheat bread. Whole-grain or whole-wheat pasta. Brown rice. [place]. Bulgur. Whole-grain and low-sodium cereals. Pita bread. Low-fat, low-sodium crackers. Whole-wheat flour tortillas. ?Meats and other proteins ?Skinless chicken or [place]. Ground chicken or [place]. Pork with fat trimmed off. Fish and seafood. Egg whites. Dried beans, peas, or lentils. Unsalted nuts, nut butters, and seeds. Unsalted canned beans. Lean cuts of beef with fat trimmed off. Low-sodium, lean precooked or cured meat, such as sausages or meat loaves. ?Dairy ?Low-fat (1%) or fat-free (skim) milk. Reduced-fat, low-fat, or fat-free cheeses. Nonfat, low-sodium ricotta or cottage cheese. Low-fat or nonfat yogurt. Low-fat, low-sodium cheese. ?Fats and oils ?Soft margarine without trans fats. Vegetable oil. Reduced-fat, low-fat, or light mayonnaise and salad dressings (reduced-sodium). Canola, safflower, olive, avocado, soybean, and sunflower oils. Avocado. ?Seasonings and condiments ?Herbs. Spices. Seasoning mixes without salt. ?Other foods ?Unsalted popcorn and pretzels. Fat-free sweets. ?The items listed above may not be a complete list of foods and beverages you can eat. Contact a dietitian for more information. ?What foods should I avoid? ?Fruits ?Canned fruit in a light or heavy syrup. Fried fruit. Fruit in cream or butter sauce. ?Vegetables ?Creamed or fried vegetables. Vegetables in a cheese sauce. Regular canned vegetables (not low-sodium or reduced-sodium). Regular canned tomato sauce and paste (not low-sodium or reduced-sodium). Regular tomato and vegetable juice (not low-sodium or reduced-sodium). Angie Fava. Olives. ?Grains ?Baked goods made with fat, such as croissants, muffins, or some breads. Dry pasta or rice meal packs. ?Meats and other proteins ?Fatty cuts of  meat. Ribs. Fried meat. Berniece Salines. Bologna, salami, and other precooked or cured meats, such as sausages or meat loaves. Fat from the back of a pig (fatback). Bratwurst. Salted nuts and seeds. Canned beans with added salt. Canned or smoked fish. Whole eggs or egg yolks. Chicken or [place] with skin. ?Dairy ?Whole or 2% milk, cream, and half-and-half. Whole or full-fat cream cheese. Whole-fat or sweetened yogurt. Full-fat cheese. Nondairy creamers. Whipped toppings. Processed cheese and cheese spreads. ?Fats and oils ?Butter. Stick margarine. Lard. Shortening. Ghee. Bacon fat. Tropical oils, such as coconut, palm kernel, or palm oil. ?Seasonings and condiments ?Onion salt, garlic salt, seasoned salt, table salt, and sea salt. Worcestershire sauce. Tartar sauce. Barbecue sauce. Teriyaki sauce. Soy sauce, including reduced-sodium. Steak sauce. Canned and packaged gravies. Fish sauce. Oyster sauce. Cocktail sauce. Store-bought horseradish. Ketchup. Mustard. Meat flavorings and tenderizers. Bouillon cubes. Hot sauces. Pre-made or packaged marinades. Pre-made or packaged taco seasonings. Relishes. Regular salad dressings. ?Other foods ?Salted popcorn and pretzels. ?The items listed above may not be a complete list of foods and beverages you should avoid. Contact a dietitian for more information. ?Where to find more information ?National Heart, Lung, and Blood Institute: https://wilson-eaton.com/ ?American Heart Association: www.heart.org ?Academy of Nutrition and Dietetics: www.eatright.org ?[place]: www.kidney.org ?Summary ?The DASH eating plan is a healthy eating plan that has been shown to reduce high blood  pressure (hypertension). It may also reduce your risk for type 2 diabetes, heart disease, and stroke. ?When on the DASH eating plan, aim to eat more fresh fruits and vegetables, whole grains, lean proteins, low-fat dairy, and heart-healthy fats. ?With the DASH eating plan, you should limit salt (sodium) intake  to 2,300 mg a day. If you have hypertension, you may need to reduce your sodium intake to 1,500 mg a day. ?Work with your health care provider or dietitian to adjust your eating plan to your individual ca

## 2021-11-13 ENCOUNTER — Other Ambulatory Visit: Payer: Self-pay | Admitting: Family Medicine

## 2021-11-14 ENCOUNTER — Ambulatory Visit (INDEPENDENT_AMBULATORY_CARE_PROVIDER_SITE_OTHER): Payer: Medicare HMO | Admitting: Nurse Practitioner

## 2021-11-14 ENCOUNTER — Encounter: Payer: Self-pay | Admitting: Nurse Practitioner

## 2021-11-14 ENCOUNTER — Telehealth: Payer: Self-pay | Admitting: *Deleted

## 2021-11-14 VITALS — BP 118/68 | HR 84 | Temp 98.2°F | Ht 69.0 in | Wt 156.4 lb

## 2021-11-14 DIAGNOSIS — R911 Solitary pulmonary nodule: Secondary | ICD-10-CM | POA: Diagnosis not present

## 2021-11-14 DIAGNOSIS — I700 Atherosclerosis of aorta: Secondary | ICD-10-CM | POA: Diagnosis not present

## 2021-11-14 DIAGNOSIS — F4321 Adjustment disorder with depressed mood: Secondary | ICD-10-CM | POA: Diagnosis not present

## 2021-11-14 DIAGNOSIS — E559 Vitamin D deficiency, unspecified: Secondary | ICD-10-CM

## 2021-11-14 DIAGNOSIS — I10 Essential (primary) hypertension: Secondary | ICD-10-CM

## 2021-11-14 DIAGNOSIS — R4189 Other symptoms and signs involving cognitive functions and awareness: Secondary | ICD-10-CM | POA: Diagnosis not present

## 2021-11-14 DIAGNOSIS — F4320 Adjustment disorder, unspecified: Secondary | ICD-10-CM

## 2021-11-14 DIAGNOSIS — R7309 Other abnormal glucose: Secondary | ICD-10-CM

## 2021-11-14 DIAGNOSIS — E785 Hyperlipidemia, unspecified: Secondary | ICD-10-CM

## 2021-11-14 DIAGNOSIS — Z8673 Personal history of transient ischemic attack (TIA), and cerebral infarction without residual deficits: Secondary | ICD-10-CM

## 2021-11-14 DIAGNOSIS — F17219 Nicotine dependence, cigarettes, with unspecified nicotine-induced disorders: Secondary | ICD-10-CM

## 2021-11-14 DIAGNOSIS — N400 Enlarged prostate without lower urinary tract symptoms: Secondary | ICD-10-CM | POA: Diagnosis not present

## 2021-11-14 DIAGNOSIS — J438 Other emphysema: Secondary | ICD-10-CM | POA: Diagnosis not present

## 2021-11-14 DIAGNOSIS — E538 Deficiency of other specified B group vitamins: Secondary | ICD-10-CM | POA: Diagnosis not present

## 2021-11-14 LAB — BAYER DCA HB A1C WAIVED: HB A1C (BAYER DCA - WAIVED): 5.3 % (ref 4.8–5.6)

## 2021-11-14 MED ORDER — CYANOCOBALAMIN 1000 MCG PO TABS: 1000.0000 ug | ORAL_TABLET | Freq: Every day | ORAL | 4 refills | Status: DC

## 2021-11-14 MED ORDER — GABAPENTIN 300 MG PO CAPS: 300.0000 mg | ORAL_CAPSULE | Freq: Every day | ORAL | 4 refills | Status: DC

## 2021-11-14 MED ORDER — TAMSULOSIN HCL 0.4 MG PO CAPS: 0.4000 mg | ORAL_CAPSULE | Freq: Every day | ORAL | 4 refills | Status: DC

## 2021-11-14 MED ORDER — CITALOPRAM HYDROBROMIDE 20 MG PO TABS: 20.0000 mg | ORAL_TABLET | Freq: Every day | ORAL | 4 refills | Status: DC

## 2021-11-14 MED ORDER — FERROUS SULFATE 325 (65 FE) MG PO TBEC: 325.0000 mg | DELAYED_RELEASE_TABLET | Freq: Every day | ORAL | 4 refills | Status: DC

## 2021-11-14 MED ORDER — METOPROLOL TARTRATE 25 MG PO TABS: 25.0000 mg | ORAL_TABLET | Freq: Two times a day (BID) | ORAL | 4 refills | Status: DC

## 2021-11-14 MED ORDER — OMEPRAZOLE 20 MG PO CPDR: DELAYED_RELEASE_CAPSULE | ORAL | 4 refills | Status: DC

## 2021-11-14 MED ORDER — PRAVASTATIN SODIUM 20 MG PO TABS: 20.0000 mg | ORAL_TABLET | Freq: Every day | ORAL | 4 refills | Status: DC

## 2021-11-14 MED ORDER — AMLODIPINE BESYLATE 10 MG PO TABS: 10.0000 mg | ORAL_TABLET | Freq: Every day | ORAL | 4 refills | Status: DC

## 2021-11-14 NOTE — Assessment & Plan Note (Signed)
Chronic, stable.  Continue Flomax nightly, refills sent in.  PSA on labs today. ?

## 2021-11-14 NOTE — Progress Notes (Signed)
? ?BP 118/68 (BP Location: Left Arm, Patient Position: Sitting)   Pulse 84   Temp 98.2 ?F (36.8 ?C) (Oral)   Ht 5\' 9"  (1.753 m)   Wt 156 lb 6.4 oz (70.9 kg)   SpO2 97%   BMI 23.10 kg/m?   ? ?Subjective:  ? ? Patient ID: Mark [name], male    DOB: 04-Oct-1960, 61 y.o.   MRN: [id number] ? ?HPI: ?Mark [name] is a 61 y.o. male ? ?Chief Complaint  ?Patient presents with  ? Health Concerns  ?  Patient states he received some paperwork and he would like to discuss with his provider.   ? Hypertension  ? Emphysema  ? Anxiety  ?  Patient states he has not been able to sleep at night due to having high anxiety. Patient declines taking any medication for his anxiety. Patient states he thinks his brother gives him Gabapentin to help keep him calm and helps him sleep, but he isn't sure if that is the name of the medication.   ? ?HYPERTENSION / HYPERLIPIDEMIA ?Continues on Amlodipine and Metoprolol daily.  Has papers for long term disability to discuss. ?  ?Has history of elevation in LFT due to alcohol intake, recent in August AST/ALT 14/22.  Last A1c 5.5%, has history of elevation in this in past at 5.8% in 2019. ?Satisfied with current treatment? yes ?Duration of hypertension: chronic ?BP monitoring frequency: not checking ?BP range:  ?BP medication side effects: no ?Duration of hyperlipidemia: chronic ?Cholesterol medication side effects: no ?Cholesterol supplements: none ?Medication compliance: good compliance ?Aspirin: yes ?Recent stressors: no ?Recurrent headaches: no ?Visual changes: no ?Palpitations: no ?Dyspnea: no ?Chest pain: no ?Lower extremity edema: no ?Dizzy/lightheaded: no  ?The ASCVD Risk score (Arnett DK, et al., 2019) failed to calculate for the following reasons: ?  The patient has a prior MI or stroke diagnosis ? ?COPD ?Had lung CT screening in September 2021 -- noted emphysema and aortic atherosclerosis + a 2.8 cm left lower lobe nodule -- to continue yearly screening.  Smokes about 1 PPD, has  been smoking since age 40.  No current inhalers or symptoms.   ?COPD status: stable ?Satisfied with current treatment?: yes ?Oxygen use: no ?Dyspnea frequency: none ?Cough frequency: none ?Rescue inhaler frequency:  none ?Limitation of activity: no ?Productive cough: none ?Last Spirometry: none ?Pneumovax: Not up to Date ?Influenza: refuses ? ?ANEMIA ?Continues on iron and B12 for history of low levels and GI bleed in past.  Remains on Flomax for BPH with benefit -- history of kidney stones. ?Anemia status: controlled ?Etiology of anemia: GI bleed ?Duration of anemia treatment: unknown ?Compliance with treatment: good compliance ?Iron supplementation side effects: no ?Severity of anemia: mild ?Fatigue: no ?Decreased exercise tolerance: no  ?Dyspnea on exertion: no ?Palpitations: no ?Bleeding: no ?Pica: no ?  ?ANXIETY/STRESS ?Continues on Celexa daily with benefit, has been on since losing his mother one year ago.  Lives on own in mother's house.  Has been having trouble sleeping, his brother gave him some Gabapentin which helped a lot.   ?Duration:controlled ?Anxious mood:  sometimes   ?Excessive worrying: no ?Irritability: no  ?Sweating: no ?Nausea: no ?Palpitations:no ?Hyperventilation: no ?Panic attacks: no ?Agoraphobia: no  ?Obscessions/compulsions: no ?Depressed mood: no ? ?  03/22/2021  ? 11:08 AM 03/18/2021  ?  2:37 PM 12/07/2020  ?  2:07 PM 11/15/2020  ?  2:28 PM 09/15/2020  ?  1:46 PM  ?Depression screen PHQ 2/9  ?Decreased Interest 0 0 3  0 0  ?Down, Depressed, Hopeless  0 3 3 0  ?PHQ - 2 Score 0 0 6 3 0  ?Altered sleeping   0 3   ?Tired, decreased energy   0 0   ?Change in appetite   0 0   ?Feeling bad or failure about yourself    0 0   ?Trouble concentrating   1 0   ?Moving slowly or fidgety/restless   0 0   ?Suicidal thoughts   0 0   ?PHQ-9 Score   7 6   ? ?Anhedonia: no ?Weight changes: no ?Insomnia: yes hard to fall asleep  ?Hypersomnia: no ?Fatigue/loss of energy: no ?Feelings of worthlessness: no ?Feelings of  guilt: no ?Impaired concentration/indecisiveness: no ?Suicidal ideations: no  ?Crying spells: no ?Recent Stressors/Life Changes: no ?  Relationship problems: no ?  Family stress: no   ?  Financial stress: no  ?  Job stress: no  ?  Recent death/loss: no  ? ?Relevant past medical, surgical, family and social history reviewed and updated as indicated. Interim medical history since our last visit reviewed. ?Allergies and medications reviewed and updated. ? ?Review of Systems  ?Constitutional:  Negative for activity change, diaphoresis, fatigue and fever.  ?Respiratory:  Negative for cough, chest tightness, shortness of breath and wheezing.   ?Cardiovascular:  Negative for chest pain, palpitations and leg swelling.  ?Neurological: Negative.   ?Psychiatric/Behavioral: Negative.    ? ?Per HPI unless specifically indicated above ? ?   ?Objective:  ?  ?BP 118/68 (BP Location: Left Arm, Patient Position: Sitting)   Pulse 84   Temp 98.2 ?F (36.8 ?C) (Oral)   Ht 5\' 9"  (1.753 m)   Wt 156 lb 6.4 oz (70.9 kg)   SpO2 97%   BMI 23.10 kg/m?   ?Wt Readings from Last 3 Encounters:  ?11/14/21 156 lb 6.4 oz (70.9 kg)  ?03/27/21 140 lb (63.5 kg)  ?03/22/21 145 lb (65.8 kg)  ?  ?Physical Exam ?Vitals and nursing note reviewed.  ?Constitutional:   ?   General: He is awake. He is not in acute distress. ?   Appearance: He is well-developed and well-groomed. He is not ill-appearing.  ?HENT:  ?   Head: Normocephalic and atraumatic.  ?   Right Ear: Hearing normal. No drainage.  ?   Left Ear: Hearing normal. No drainage.  ?Eyes:  ?   General: Lids are normal.     ?   Right eye: No discharge.     ?   Left eye: No discharge.  ?   Conjunctiva/sclera: Conjunctivae normal.  ?   Pupils: Pupils are equal, round, and reactive to light.  ?Neck:  ?   Vascular: No carotid bruit.  ?   Trachea: Trachea normal.  ?Cardiovascular:  ?   Rate and Rhythm: Normal rate and regular rhythm.  ?   Heart sounds: Normal heart sounds, S1 normal and S2 normal. No  murmur heard. ?  No gallop.  ?Pulmonary:  ?   Effort: Pulmonary effort is normal. No accessory muscle usage or respiratory distress.  ?   Breath sounds: Normal breath sounds.  ?Abdominal:  ?   General: Bowel sounds are normal.  ?   Palpations: Abdomen is soft. There is no hepatomegaly or splenomegaly.  ?Musculoskeletal:     ?   General: Normal range of motion.  ?   Cervical back: Normal range of motion and neck supple.  ?   Right lower leg: No edema.  ?  Left lower leg: No edema.  ?Skin: ?   General: Skin is warm and dry.  ?   Capillary Refill: Capillary refill takes less than 2 seconds.  ?   Findings: No rash.  ?Neurological:  ?   Mental Status: He is alert and oriented to person, place, and time.  ?   Deep Tendon Reflexes: Reflexes are normal and symmetric.  ?Psychiatric:     ?   Attention and Perception: Attention normal.     ?   Mood and Affect: Mood normal.     ?   Speech: Speech normal.     ?   Behavior: Behavior normal. Behavior is cooperative.     ?   Thought Content: Thought content normal.  ?   Comments: Good spirits today.  ? ?Results for orders placed or performed in visit on 11/14/21  ?Bayer DCA Hb A1c Waived  ?Result Value Ref Range  ? HB A1C (BAYER DCA - WAIVED) 5.3 4.8 - 5.6 %  ? ?   ?Assessment & Plan:  ? ?Problem List Items Addressed This Visit   ? ?  ? Cardiovascular and Mediastinum  ? Aortic atherosclerosis (HCC)  ?  Noted on past lung screening.  Educated patient on this.  Recommend complete cessation of smoking + continue daily statin & ASA for prevention. ?  ?  ? Relevant Medications  ? metoprolol tartrate (LOPRESSOR) 25 MG tablet  ? amLODipine (NORVASC) 10 MG tablet  ? pravastatin (PRAVACHOL) 20 MG tablet  ? Other Relevant Orders  ? Comprehensive metabolic panel  ? Lipid Panel w/o Chol/HDL Ratio  ? Hypertension  ?  Chronic, ongoing.  At goal in office today.  Continue current medication regimen and adjust as needed.  Recommend he monitor BP a few days a week at home and document for provider  + focus on DASH diet.  LABS: CBC, CMP, TSH.  Unable to void, could not obtain urine ALB.  Return to office in 6 months.  Refills sent. ?  ?  ? Relevant Medications  ? metoprolol tartrate (LOPRESSOR) 25 MG tablet  ?

## 2021-11-14 NOTE — Assessment & Plan Note (Signed)
Noted on past lung screening.  Educated patient on this.  Recommend complete cessation of smoking + continue daily statin & ASA for prevention. 

## 2021-11-14 NOTE — Assessment & Plan Note (Signed)
Loss of his mother, who was main support person, 1 year ago.  Denies SI/HI.  Has support in son and brother locally.  Continue Celexa daily 20 MG, may change to Zoloft in future if needed for further up titration.  Offered supportive listening.  Will start Gabapentin 300 MG QHS for sleep and anxiety, this has been working well for him and advised him to not take his brother's medication.  Return in 6 months. ?

## 2021-11-14 NOTE — Assessment & Plan Note (Signed)
A1c remains stable today at 5.3%, no further elevations over past year.  Continue diet focus.   ?

## 2021-11-14 NOTE — Telephone Encounter (Signed)
Pt called in, states doesn't have email to be able to do the paperwork. ?

## 2021-11-14 NOTE — Assessment & Plan Note (Signed)
Noted on past labs, 99.  To continue daily supplement and will recheck today.  Refills sent. ?

## 2021-11-14 NOTE — Assessment & Plan Note (Signed)
Chronic, ongoing.  At goal in office today.  Continue current medication regimen and adjust as needed.  Recommend he monitor BP a few days a week at home and document for provider + focus on DASH diet.  LABS: CBC, CMP, TSH.  Unable to void, could not obtain urine ALB.  Return to office in 6 months.  Refills sent. ?

## 2021-11-14 NOTE — Assessment & Plan Note (Signed)
In 2014 x 2 with history of drug abuse.  Monitor closely and continue current medication regimen.  Goals: A1c <6.5%, LDL <70, and BP <130/90.  Needs help with long term disability papers, referral placed to care management to assist. ?

## 2021-11-14 NOTE — Telephone Encounter (Signed)
? ?  Telephone encounter was:  Successful.  ?11/14/2021 ?Name: OSLO MULHEARN MRN: [id number] DOB: January 30, 1961 ? ?SAMULE UGALDE is a 61 y.o. year old male who is a primary care patient of Cannady, Barbaraann Faster, NP . The community resource team was consulted for assistance with  disabilty  Has private disability from another company and needs paperwork filled out told him the only way I would be able to help would be if he could email it to me then we would have to complete over the phone and then email back to him ? ?Care guide performed the following interventions: Patient provided with information about care guide support team and interviewed to confirm resource needs ?Disability  . ? ?Follow Up Plan:  Care guide will follow up with patient by phone over the next days ? ?Vishwa Dais Greenauer -Selinda Eon ?Care Guide , Embedded Care Coordination ?[place], Care Management  ?[phone] ?[address] , [place] [address] ?Email : [email or web address] ?  ?

## 2021-11-14 NOTE — Assessment & Plan Note (Signed)
Chronic, no current inhalers or symptoms.  Long time smoker.  Have recommended complete cessation.  Will plan on spirometry at upcoming visits, refuses today.  Continue yearly lung CT CA screening.  Initiate inhaler regimen as needed.  CBC today. ?

## 2021-11-14 NOTE — Assessment & Plan Note (Signed)
Has long term disability papers to complete, will need major assist with this due to low ability to read and write.  Referral to care management to assist and provided him numbers to call.   ?

## 2021-11-14 NOTE — Assessment & Plan Note (Signed)
I have recommended complete cessation of tobacco use. I have discussed various options available for assistance with tobacco cessation including over the counter methods (Nicotine gum, patch and lozenges). We also discussed prescription options (Chantix, Nicotine Inhaler / Nasal Spray). The patient is not interested in pursuing any prescription tobacco cessation options at this time.  

## 2021-11-14 NOTE — Assessment & Plan Note (Signed)
Continue yearly lung screening to monitor -- recommend complete cessation of smoking. 

## 2021-11-14 NOTE — Assessment & Plan Note (Signed)
Chronic, ongoing.  Continue current medication regimen and adjust as needed. Lipid panel today. 

## 2021-11-15 ENCOUNTER — Telehealth: Payer: Self-pay | Admitting: Nurse Practitioner

## 2021-11-15 ENCOUNTER — Other Ambulatory Visit: Payer: Self-pay | Admitting: Nurse Practitioner

## 2021-11-15 LAB — CBC WITH DIFFERENTIAL/PLATELET
Basophils Absolute: 0.1 10*3/uL (ref 0.0–0.2)
Basos: 2 %
EOS (ABSOLUTE): 0.3 10*3/uL (ref 0.0–0.4)
Eos: 3 %
Hematocrit: 42.9 % (ref 37.5–51.0)
Hemoglobin: 14.6 g/dL (ref 13.0–17.7)
Immature Grans (Abs): 0 10*3/uL (ref 0.0–0.1)
Immature Granulocytes: 0 %
Lymphocytes Absolute: 2.3 10*3/uL (ref 0.7–3.1)
Lymphs: 28 %
MCH: 32.2 pg (ref 26.6–33.0)
MCHC: 34 g/dL (ref 31.5–35.7)
MCV: 95 fL (ref 79–97)
Monocytes Absolute: 0.5 10*3/uL (ref 0.1–0.9)
Monocytes: 6 %
Neutrophils Absolute: 5 10*3/uL (ref 1.4–7.0)
Neutrophils: 61 %
Platelets: 326 10*3/uL (ref 150–450)
RBC: 4.54 x10E6/uL (ref 4.14–5.80)
RDW: 13.1 % (ref 11.6–15.4)
WBC: 8.2 10*3/uL (ref 3.4–10.8)

## 2021-11-15 LAB — COMPREHENSIVE METABOLIC PANEL
ALT: 25 IU/L (ref 0–44)
AST: 22 IU/L (ref 0–40)
Albumin/Globulin Ratio: 1.9 (ref 1.2–2.2)
Albumin: 4.6 g/dL (ref 3.8–4.8)
Alkaline Phosphatase: 126 IU/L — ABNORMAL HIGH (ref 44–121)
BUN/Creatinine Ratio: 11 (ref 10–24)
BUN: 12 mg/dL (ref 8–27)
Bilirubin Total: 0.3 mg/dL (ref 0.0–1.2)
CO2: 24 mmol/L (ref 20–29)
Calcium: 9.4 mg/dL (ref 8.6–10.2)
Chloride: 100 mmol/L (ref 96–106)
Creatinine, Ser: 1.1 mg/dL (ref 0.76–1.27)
Globulin, Total: 2.4 g/dL (ref 1.5–4.5)
Glucose: 104 mg/dL — ABNORMAL HIGH (ref 70–99)
Potassium: 4.5 mmol/L (ref 3.5–5.2)
Sodium: 140 mmol/L (ref 134–144)
Total Protein: 7 g/dL (ref 6.0–8.5)
eGFR: 76 mL/min/{1.73_m2} (ref >59)

## 2021-11-15 LAB — VITAMIN D 25 HYDROXY (VIT D DEFICIENCY, FRACTURES): Vit D, 25-Hydroxy: 23 ng/mL — ABNORMAL LOW (ref 30.0–100.0)

## 2021-11-15 LAB — LIPID PANEL W/O CHOL/HDL RATIO
Cholesterol, Total: 142 mg/dL (ref 100–199)
HDL: 43 mg/dL (ref >39)
LDL Chol Calc (NIH): 79 mg/dL (ref 0–99)
Triglycerides: 110 mg/dL (ref 0–149)
VLDL Cholesterol Cal: 20 mg/dL (ref 5–40)

## 2021-11-15 LAB — MAGNESIUM: Magnesium: 2 mg/dL (ref 1.6–2.3)

## 2021-11-15 LAB — TSH: TSH: 3.09 u[IU]/mL (ref 0.450–4.500)

## 2021-11-15 LAB — PSA: Prostate Specific Ag, Serum: 0.5 ng/mL (ref 0.0–4.0)

## 2021-11-15 LAB — VITAMIN B12: Vitamin B-12: 1501 pg/mL — ABNORMAL HIGH (ref 232–1245)

## 2021-11-15 MED ORDER — VITAMIN D3 25 MCG (1000 UT) PO CAPS: 2000.0000 [IU] | ORAL_CAPSULE | Freq: Every day | ORAL | 4 refills | Status: DC

## 2021-11-15 NOTE — Telephone Encounter (Signed)
Discontinued 11/14/21. Not on current med profile. ? ?Requested Prescriptions  ?Pending Prescriptions Disp Refills  ?? sucralfate (CARAFATE) 1 g tablet [Pharmacy Med Name: SUCRALFATE 1 GM TABLET] 360 tablet 1  ?  Sig: TAKE 1 TABLET (1 G TOTAL) BY MOUTH 4 (FOUR) TIMES DAILY - WITH MEALS AND AT BEDTIME.  ?  ? Gastroenterology: Antiacids Passed - 11/13/2021 12:55 AM  ?  ?  Passed - Valid encounter within last 12 months  ?  Recent Outpatient Visits   ?      ? Yesterday Paraseptal emphysema (HCC)  ? [facility] [place], Oakwood T, NP  ? 7 months ago COVID-19  ? [facility] Larae Grooms, NP  ? 7 months ago Upper respiratory tract infection, unspecified type  ? [facility] McElwee, Lauren A, NP  ? 10 months ago Grief reaction  ? [facility] [place], Montrose T, NP  ? 11 months ago Anxiety and depression  ? [facility] [place], Corrie Dandy T, NP  ?  ?  ?Future Appointments   ?        ? In 4 months  [facility], [facility]  ? In 6 months Cannady, Dorie Rank, NP [facility], [facility]  ?  ? ?  ?  ?  ? ? ?

## 2021-11-15 NOTE — Progress Notes (Signed)
Good morning, please let Quita Skye know his labs have returned: ?- Kidney function, creatinine and eGFR, remains normal, as is liver function, AST and ALT.   ?- Vitamin D level is a little low, I recommend taking Vitamin D3 2000 units daily and will send this in to add to your pill container. ?- B12 level is above normal level now, we will recheck next visit.  I do not want it getting too high.  For now continue supplement. ?- Remainder of labs are all in normal range.   ?- Also saw where Care Guide called him yesterday to assist with papers -- he needs an email to send forms to her, I see he does not have one.  Can he get help from his brother on this, so he can email forms to care guide and they can help him complete? ?Any questions? ?Keep being amazing!!  Thank you for allowing me to participate in your care.  I appreciate you. ?Kindest regards, ?Bohdan Macho ? ?

## 2021-11-15 NOTE — Telephone Encounter (Signed)
Patient states he has 30 days from 3/17 until [place] paperwork is due. Patient states he will need assistance and the people at social security does not know him. Advised patient someone from the care management team will be calling him to assist. Patient also states he is going to need assistance with reading and filling out forms.  Please try cell phone # first [phone] ?

## 2021-11-16 ENCOUNTER — Telehealth: Payer: Self-pay | Admitting: *Deleted

## 2021-11-16 NOTE — Telephone Encounter (Signed)
Pt walked in and we will be emailing his forms to [place] and have her email the forms back to his son's email once completed. ?

## 2021-11-16 NOTE — Telephone Encounter (Signed)
? ?  Telephone encounter was:  Unsuccessful.  11/16/2021 ?Name: Mark [name] MRN: [id number] DOB: 1961-01-29 ? ?Unsuccessful outbound call made today to assist with:  voicemail not set up , note in chart to call patient at this number he drp[[ed disability paperwork off at the office for me to  complete for him and they faxed it but will not be able to open as a document to fill in   ? ?Outreach Attempt:  2nd Attempt ? ?No voicemail ?[name] ?Care Guide , Embedded Care Coordination ?[place], Care Management  ?[phone] ?[address] [place] , [place] [address] ?Email : [email or web address] ?  ?

## 2021-11-17 ENCOUNTER — Telehealth: Payer: Self-pay

## 2021-11-17 NOTE — Telephone Encounter (Signed)
error 

## 2021-11-17 NOTE — Telephone Encounter (Signed)
Pt called in to update provider, pt would like to pick up forms when ready instead of having them emailed.  ? ?[phone] or [phone]  ?

## 2021-11-18 NOTE — Addendum Note (Signed)
Addended by: Aura Dials T on: 11/18/2021 08:18 AM ? ? Modules accepted: Orders ? ?

## 2021-11-18 NOTE — Telephone Encounter (Signed)
Spoke with pt and explained to him that [facility] does not do long term disability and he would need a specialist to fill these out for him. Informed him that [facility] would be referring him to a neurologist and they will call him to set up an appointment. Pt aware that we don't have a specific time frame for when the appointment will be made but he will get a call to schedule the appointment once the referral has been fully processed. Pt verbalized understanding and will be coming to pick up his papers to take with him to his appointment when he is scheduled.  ?

## 2021-11-21 ENCOUNTER — Telehealth: Payer: Self-pay | Admitting: *Deleted

## 2021-11-21 NOTE — Telephone Encounter (Signed)
? ?  Telephone encounter was:  Unsuccessful.  11/21/2021 ?Name: CURVIN HUNGER MRN: [id number] DOB: March 07, 1961 ? ?Unsuccessful outbound call made today to assist with:  patient did not pick up , messge from practice that they dont understand the paperwork needs to be completed by MD> will reach out to explain ? ?Outreach Attempt:  3rd Attempt.  Referral closed unable to contact patient. ? ?A HIPAA compliant voice message was left requesting a return call.  Instructed patient to call back at   Instructed patient to call back at [phone]  at their earliest convenience.  ? ? ? ? ?Alois Cliche -Berneda Rose ?Care Guide , Embedded Care Coordination ?[place], Care Management  ?[phone] ?[address] [place] , [place] [address] ?Email : [email or web address] ? . ? ?

## 2021-11-22 ENCOUNTER — Telehealth: Payer: Self-pay | Admitting: *Deleted

## 2021-11-22 NOTE — Telephone Encounter (Signed)
? ?  Telephone encounter was:  Unsuccessful.  11/22/2021 ?Name: TUSHAR ENNS MRN: [id number] DOB: 07-May-1961 ? ?Unsuccessful outbound call made today to assist with:  Tried again to call patient no response as the phone rang , The office needs dr to complete his disability forms and return them to him. I'm not able to complete these forms and have tried to tell the dr and the office this. ? ?Outreach Attempt:  3rd Attempt.  Referral closed unable to contact patient.  ? ?No answer  ? ?Alois Cliche -Berneda Rose ?Care Guide , Embedded Care Coordination ?[place], Care Management  ?[phone] ?[address] [place] , [place] [address] ?Email : [email or web address] [email or web address] ?  ? ?

## 2021-12-06 DIAGNOSIS — R413 Other amnesia: Secondary | ICD-10-CM | POA: Diagnosis not present

## 2021-12-06 DIAGNOSIS — Z8673 Personal history of transient ischemic attack (TIA), and cerebral infarction without residual deficits: Secondary | ICD-10-CM | POA: Diagnosis not present

## 2021-12-06 DIAGNOSIS — R479 Unspecified speech disturbances: Secondary | ICD-10-CM | POA: Diagnosis not present

## 2021-12-06 DIAGNOSIS — G479 Sleep disorder, unspecified: Secondary | ICD-10-CM | POA: Diagnosis not present

## 2021-12-06 DIAGNOSIS — I10 Essential (primary) hypertension: Secondary | ICD-10-CM | POA: Diagnosis not present

## 2022-01-31 ENCOUNTER — Other Ambulatory Visit: Payer: Self-pay | Admitting: Family Medicine

## 2022-02-01 NOTE — Telephone Encounter (Signed)
Requested by interface surescripts. Discontinued medication 11/14/21 by J. Harvest Dark, NP. Requested Prescriptions  Refused Prescriptions Disp Refills  . sucralfate (CARAFATE) 1 g tablet [Pharmacy Med Name: SUCRALFATE 1 GM TABLET] 360 tablet 1    Sig: TAKE 1 TABLET (1 G TOTAL) BY MOUTH 4 (FOUR) TIMES DAILY - WITH MEALS AND AT BEDTIME.     Gastroenterology: Antiacids Passed - 01/31/2022  5:40 PM      Passed - Valid encounter within last 12 months    Recent Outpatient Visits          2 months ago Paraseptal emphysema (HCC)   [facility] [place], Dorie Rank, NP   10 months ago COVID-19   [facility] [place], Clydie Braun, NP   10 months ago Upper respiratory tract infection, unspecified type   [facility] McElwee, Lauren A, NP   1 year ago Grief reaction   [facility] [place], Dorie Rank, NP   1 year ago Anxiety and depression   [facility] [place], Dorie Rank, NP      Future Appointments            In 1 month  [facility], [facility]   In 3 months Cannady, Dorie Rank, NP [facility], [facility]

## 2022-03-20 ENCOUNTER — Ambulatory Visit: Payer: Medicare HMO

## 2022-04-03 ENCOUNTER — Ambulatory Visit (INDEPENDENT_AMBULATORY_CARE_PROVIDER_SITE_OTHER): Payer: Medicare HMO | Admitting: Nurse Practitioner

## 2022-04-03 ENCOUNTER — Ambulatory Visit: Payer: Self-pay

## 2022-04-03 ENCOUNTER — Encounter: Payer: Self-pay | Admitting: Nurse Practitioner

## 2022-04-03 VITALS — BP 85/58 | HR 74 | Temp 98.2°F | Ht 69.0 in | Wt 156.3 lb

## 2022-04-03 DIAGNOSIS — I63233 Cerebral infarction due to unspecified occlusion or stenosis of bilateral carotid arteries: Secondary | ICD-10-CM | POA: Diagnosis not present

## 2022-04-03 DIAGNOSIS — R29810 Facial weakness: Secondary | ICD-10-CM

## 2022-04-03 DIAGNOSIS — J9811 Atelectasis: Secondary | ICD-10-CM | POA: Diagnosis not present

## 2022-04-03 DIAGNOSIS — R0902 Hypoxemia: Secondary | ICD-10-CM | POA: Diagnosis not present

## 2022-04-03 DIAGNOSIS — R9089 Other abnormal findings on diagnostic imaging of central nervous system: Secondary | ICD-10-CM | POA: Diagnosis not present

## 2022-04-03 DIAGNOSIS — F0150 Vascular dementia without behavioral disturbance: Secondary | ICD-10-CM | POA: Diagnosis not present

## 2022-04-03 DIAGNOSIS — G319 Degenerative disease of nervous system, unspecified: Secondary | ICD-10-CM | POA: Diagnosis not present

## 2022-04-03 DIAGNOSIS — R531 Weakness: Secondary | ICD-10-CM | POA: Diagnosis not present

## 2022-04-03 DIAGNOSIS — R29818 Other symptoms and signs involving the nervous system: Secondary | ICD-10-CM | POA: Diagnosis not present

## 2022-04-03 DIAGNOSIS — I69351 Hemiplegia and hemiparesis following cerebral infarction affecting right dominant side: Secondary | ICD-10-CM | POA: Diagnosis not present

## 2022-04-03 DIAGNOSIS — R918 Other nonspecific abnormal finding of lung field: Secondary | ICD-10-CM | POA: Diagnosis not present

## 2022-04-03 DIAGNOSIS — Z8673 Personal history of transient ischemic attack (TIA), and cerebral infarction without residual deficits: Secondary | ICD-10-CM

## 2022-04-03 DIAGNOSIS — R7982 Elevated C-reactive protein (CRP): Secondary | ICD-10-CM | POA: Diagnosis not present

## 2022-04-03 DIAGNOSIS — M545 Low back pain, unspecified: Secondary | ICD-10-CM

## 2022-04-03 DIAGNOSIS — F17210 Nicotine dependence, cigarettes, uncomplicated: Secondary | ICD-10-CM | POA: Diagnosis not present

## 2022-04-03 DIAGNOSIS — D649 Anemia, unspecified: Secondary | ICD-10-CM | POA: Diagnosis not present

## 2022-04-03 DIAGNOSIS — J439 Emphysema, unspecified: Secondary | ICD-10-CM | POA: Diagnosis not present

## 2022-04-03 NOTE — Assessment & Plan Note (Signed)
Chronic, ongoing.  At this time continue Gabapentin 300 MG QHS and will adjust after ER visit if stable.

## 2022-04-03 NOTE — Progress Notes (Signed)
BP (!) 85/58   Pulse 74   Temp 98.2 F (36.8 C) (Oral)   Ht 5' 9"  (1.753 m)   Wt 156 lb 4.8 oz (70.9 kg)   SpO2 93%   BMI 23.08 kg/m    Subjective:    Patient ID: Mark [name], male    DOB: Mar 25, 1961, 61 y.o.   MRN: [id number]  HPI: Mark [name] is a 61 y.o. male  Chief Complaint  Patient presents with   Facial Swelling    Patient is here for Jaw Swelling. Patient says the right side of his face hurts and he is in a lot of pain. Patient says his teeth does not hurt. Patient says it all started Friday night. Patient denies taking any medication to help and has denied using warm/cold compress. Patient says the pain is all day.    Medication Problem    Patient says he would like to discuss increasing the dosage of Gabapentin from one a day as he feels his current dose isn't helping at all.    FACIAL SWELLING & DROOPING Started Friday night to right side of face.  Does not recall any bug bites or change in medications.  No recent alcohol or drug use reported.  Continues to smoke about 1 PPD.  History of CVA in 2014 x 2.  He denies any weakness to right side of body.  Reports ongoing lower back pain, feels the 300 MG of Gabapentin helps at night, but not in middle of day. Fever: no -- feels warmth in jaw Cough: no Shortness of breath: no Wheezing: no Chest pain: no Chest tightness: no Chest congestion: no Nasal congestion: no Runny nose: no Post nasal drip: no Sneezing: no Sore throat: no Swollen glands: no Sinus pressure: no Headache: no Face pain: yes Toothache: no Ear pain: none Ear pressure: none Eyes red/itching:no Eye drainage/crusting: no  Vomiting: no Rash: no Fatigue: no   Relevant past medical, surgical, family and social history reviewed and updated as indicated. Interim medical history since our last visit reviewed. Allergies and medications reviewed and updated.  Review of Systems  Constitutional: Negative.   Respiratory:  Negative for  cough, chest tightness, shortness of breath and wheezing.   Cardiovascular:  Negative for chest pain, palpitations and leg swelling.  Gastrointestinal: Negative.   Neurological:  Positive for dizziness and facial asymmetry. Negative for seizures, speech difficulty, weakness, light-headedness, numbness and headaches.  Psychiatric/Behavioral: Negative.      Per HPI unless specifically indicated above     Objective:    BP (!) 85/58   Pulse 74   Temp 98.2 F (36.8 C) (Oral)   Ht 5' 9"  (1.753 m)   Wt 156 lb 4.8 oz (70.9 kg)   SpO2 93%   BMI 23.08 kg/m   Wt Readings from Last 3 Encounters:  04/03/22 156 lb 4.8 oz (70.9 kg)  11/14/21 156 lb 6.4 oz (70.9 kg)  03/27/21 140 lb (63.5 kg)    Physical Exam Vitals and nursing note reviewed.  Constitutional:      General: He is awake. He is not in acute distress.    Appearance: He is well-developed and well-groomed. He is not ill-appearing or toxic-appearing.  HENT:     Head: Normocephalic and atraumatic.     Right Ear: Hearing, tympanic membrane, ear canal and external ear normal. No drainage.     Left Ear: Hearing, tympanic membrane, ear canal and external ear normal. No drainage.     Nose: Nose  normal.     Mouth/Throat:     Lips: Pink.  Eyes:     General: Lids are normal.        Right eye: No discharge.        Left eye: No discharge.     Conjunctiva/sclera: Conjunctivae normal.     Pupils: Pupils are equal, round, and reactive to light.  Neck:     Thyroid: No thyromegaly.     Vascular: No carotid bruit.     Trachea: Trachea normal.  Cardiovascular:     Rate and Rhythm: Normal rate and regular rhythm.     Heart sounds: Normal heart sounds, S1 normal and S2 normal. No murmur heard.    No gallop.  Pulmonary:     Effort: Pulmonary effort is normal. No accessory muscle usage or respiratory distress.     Breath sounds: Normal breath sounds.  Abdominal:     General: Bowel sounds are normal.     Palpations: Abdomen is soft. There  is no hepatomegaly or splenomegaly.  Musculoskeletal:        General: Normal range of motion.     Cervical back: Normal range of motion and neck supple.     Right lower leg: No edema.     Left lower leg: No edema.  Skin:    General: Skin is warm and dry.     Capillary Refill: Capillary refill takes less than 2 seconds.     Findings: No rash.  Neurological:     Mental Status: He is alert and oriented to person, place, and time.     Cranial Nerves: Cranial nerve deficit and facial asymmetry present.     Sensory: Sensation is intact.     Motor: No weakness.     Coordination: Romberg sign positive.     Gait: Gait is intact.     Deep Tendon Reflexes: Reflexes are normal and symmetric.     Reflex Scores:      Brachioradialis reflexes are 2+ on the right side and 2+ on the left side.      Patellar reflexes are 2+ on the right side and 2+ on the left side.    Comments: Asymmetrical smile, unable to puff cheeks out equally or clench jaw equally.  Strength equal 5/5 upper and lower extremities.  Facial droop right side.    Psychiatric:        Attention and Perception: Attention normal.        Mood and Affect: Mood normal.        Speech: Speech normal.        Behavior: Behavior normal. Behavior is cooperative.        Thought Content: Thought content normal.    Results for orders placed or performed in visit on 11/14/21  Bayer DCA Hb A1c Waived  Result Value Ref Range   HB A1C (BAYER DCA - WAIVED) 5.3 4.8 - 5.6 %  CBC with Differential/Platelet  Result Value Ref Range   WBC 8.2 3.4 - 10.8 x10E3/uL   RBC 4.54 4.14 - 5.80 x10E6/uL   Hemoglobin 14.6 13.0 - 17.7 g/dL   Hematocrit 42.9 37.5 - 51.0 %   MCV 95 79 - 97 fL   MCH 32.2 26.6 - 33.0 pg   MCHC 34.0 31.5 - 35.7 g/dL   RDW 13.1 11.6 - 15.4 %   Platelets 326 150 - 450 x10E3/uL   Neutrophils 61 Not Estab. %   Lymphs 28 Not Estab. %   Monocytes 6 Not  Estab. %   Eos 3 Not Estab. %   Basos 2 Not Estab. %   Neutrophils Absolute 5.0  1.4 - 7.0 x10E3/uL   Lymphocytes Absolute 2.3 0.7 - 3.1 x10E3/uL   Monocytes Absolute 0.5 0.1 - 0.9 x10E3/uL   EOS (ABSOLUTE) 0.3 0.0 - 0.4 x10E3/uL   Basophils Absolute 0.1 0.0 - 0.2 x10E3/uL   Immature Granulocytes 0 Not Estab. %   Immature Grans (Abs) 0.0 0.0 - 0.1 x10E3/uL  Comprehensive metabolic panel  Result Value Ref Range   Glucose 104 (H) 70 - 99 mg/dL   BUN 12 8 - 27 mg/dL   Creatinine, Ser 1.10 0.76 - 1.27 mg/dL   eGFR 76 >59 mL/min/1.73   BUN/Creatinine Ratio 11 10 - 24   Sodium 140 134 - 144 mmol/L   Potassium 4.5 3.5 - 5.2 mmol/L   Chloride 100 96 - 106 mmol/L   CO2 24 20 - 29 mmol/L   Calcium 9.4 8.6 - 10.2 mg/dL   Total Protein 7.0 6.0 - 8.5 g/dL   Albumin 4.6 3.8 - 4.8 g/dL   Globulin, Total 2.4 1.5 - 4.5 g/dL   Albumin/Globulin Ratio 1.9 1.2 - 2.2   Bilirubin Total 0.3 0.0 - 1.2 mg/dL   Alkaline Phosphatase 126 (H) 44 - 121 IU/L   AST 22 0 - 40 IU/L   ALT 25 0 - 44 IU/L  Lipid Panel w/o Chol/HDL Ratio  Result Value Ref Range   Cholesterol, Total 142 100 - 199 mg/dL   Triglycerides 110 0 - 149 mg/dL   HDL 43 >39 mg/dL   VLDL Cholesterol Cal 20 5 - 40 mg/dL   LDL Chol Calc (NIH) 79 0 - 99 mg/dL  TSH  Result Value Ref Range   TSH 3.090 0.450 - 4.500 uIU/mL  VITAMIN D 25 Hydroxy (Vit-D Deficiency, Fractures)  Result Value Ref Range   Vit D, 25-Hydroxy 23.0 (L) 30.0 - 100.0 ng/mL  Vitamin B12  Result Value Ref Range   Vitamin B-12 1,501 (H) 232 - 1,245 pg/mL  PSA  Result Value Ref Range   Prostate Specific Ag, Serum 0.5 0.0 - 4.0 ng/mL  Magnesium  Result Value Ref Range   Magnesium 2.0 1.6 - 2.3 mg/dL      Assessment & Plan:   Problem List Items Addressed This Visit       Other   Back pain    Chronic, ongoing.  At this time continue Gabapentin 300 MG QHS and will adjust after ER visit if stable.      Facial droop - Primary    Right sided with history of stroke x 2 in 2014.  High risk for recurrence.  Brother at bedside.  At this time have  highly recommend they immediately take patient to ER for further work-up due to concern for CVA.  Upper and lower extremity function not impaired.  Brother plans to take him to [facility] immediately.  Symptoms are >24 hours at this time.      History of CVA (cerebrovascular accident)    Refer to facial droop plan of care.        Follow up plan: Return for as ER visit return.

## 2022-04-03 NOTE — Assessment & Plan Note (Signed)
Right sided with history of stroke x 2 in 2014.  High risk for recurrence.  Brother at bedside.  At this time have highly recommend they immediately take patient to ER for further work-up due to concern for CVA.  Upper and lower extremity function not impaired.  Brother plans to take him to [facility] immediately.  Symptoms are >24 hours at this time.

## 2022-04-03 NOTE — Telephone Encounter (Signed)
  Chief Complaint: left side jaw swelling and pain Symptoms: swelling painful to touch Frequency: Friday Pertinent Negatives: Patient denies difficulty breathing Disposition: [] ED /[] Urgent Care (no appt availability in office) / [x] Appointment(In office/virtual)/ []  [facility]/ [] Home Care/ [] Refused Recommended Disposition /[] [facility]/ []  Follow-up with PCP Additional Notes: unsure if speech altered from jaw or previous strokes- in office visit with PCP today.   Reason for Disposition  Face swelling is painful to touch  Answer Assessment - Initial Assessment Questions 1. ONSET: "When did the swelling start?" (e.g., minutes, hours, days)     Friday 2. LOCATION: "What part of the face is swollen?"     Jaw area 3. SEVERITY: "How swollen is it?"     Altered speech-  4. ITCHING: "Is there any itching?" If Yes, ask: "How much?"   (Scale 1-10; mild, moderate or severe)     no 5. PAIN: "Is the swelling painful to touch?" If Yes, ask: "How painful is it?"   (Scale 1-10; mild, moderate or severe)   - NONE (0): no pain   - MILD (1-3): doesn't interfere with normal activities    - MODERATE (4-7): interferes with normal activities or awakens from sleep    - SEVERE (8-10): excruciating pain, unable to do any normal activities      Touch jaw left side moderate 6. FEVER: "Do you have a fever?" If Yes, ask: "What is it, how was it measured, and when did it start?"      no 7. CAUSE: "What do you think is causing the face swelling?"     Unsure  8. RECURRENT SYMPTOM: "Have you had face swelling before?" If Yes, ask: "When was the last time?" "What happened that time?"     no 9. OTHER SYMPTOMS: "Do you have any other symptoms?" (e.g., toothache, leg swelling)     Jaw feel hot  10. PREGNANCY: "Is there any chance you are pregnant?" "When was your last menstrual period?"       N/a  Protocols used: Face Swelling-A-AH

## 2022-04-03 NOTE — Patient Instructions (Signed)
Stroke Prevention Some medical conditions and lifestyle choices can lead to a higher risk for a stroke. You can help to prevent a stroke by eating healthy foods and exercising. It also helps to not smoke and to manage any health problems you may have. How can this condition affect me? A stroke is an emergency. It should be treated right away. A stroke can lead to brain damage or threaten your life. There is a better chance of surviving and getting better after a stroke if you get medical help right away. What can increase my risk? The following medical conditions may increase your risk of a stroke: Diseases of the heart and blood vessels (cardiovascular disease). High blood pressure (hypertension). Diabetes. High cholesterol. Sickle cell disease. Problems with blood clotting. Being very overweight. Sleeping problems (obstructivesleep apnea). Other risk factors include: Being older than age 60. A history of blood clots, stroke, or mini-stroke (TIA). Race, ethnic background, or a family history of stroke. Smoking or using tobacco products. Taking birth control pills, especially if you smoke. Heavy alcohol and drug use. Not being active. What actions can I take to prevent this? Manage your health conditions High cholesterol. Eat a healthy diet. If this is not enough to manage your cholesterol, you may need to take medicines. Take medicines as told by your doctor. High blood pressure. Try to keep your blood pressure below 130/80. If your blood pressure cannot be managed through a healthy diet and regular exercise, you may need to take medicines. Take medicines as told by your doctor. Ask your doctor if you should check your blood pressure at home. Have your blood pressure checked every year. Diabetes. Eat a healthy diet and get regular exercise. If your blood sugar (glucose) cannot be managed through diet and exercise, you may need to take medicines. Take medicines as told by your  doctor. Talk to your doctor about getting checked for sleeping problems. Signs of a problem can include: Snoring a lot. Feeling very tired. Make sure that you manage any other conditions you have. Nutrition  Follow instructions from your doctor about what to eat or drink. You may be told to: Eat and drink fewer calories each day. Limit how much salt (sodium) you use to 1,500 milligrams (mg) each day. Use only healthy fats for cooking, such as olive oil, canola oil, and sunflower oil. Eat healthy foods. To do this: Choose foods that are high in fiber. These include whole grains, and fresh fruits and vegetables. Eat at least 5 servings of fruits and vegetables a day. Try to fill one-half of your plate with fruits and vegetables at each meal. Choose low-fat (lean) proteins. These include low-fat cuts of meat, chicken without skin, fish, tofu, beans, and nuts. Eat low-fat dairy products. Avoid foods that: Are high in salt. Have saturated fat. Have trans fat. Have cholesterol. Are processed or pre-made. Count how many carbohydrates you eat and drink each day. Lifestyle If you drink alcohol: Limit how much you have to: 0-1 drink a day for women who are not pregnant. 0-2 drinks a day for men. Know how much alcohol is in your drink. In the [place] one drink equals one 12 oz bottle of beer (355mL), one 5 oz glass of wine (148mL), or one 1 oz glass of hard liquor (44mL). Do not smoke or use any products that have nicotine or tobacco. If you need help quitting, ask your doctor. Avoid secondhand smoke. Do not use drugs. Activity  Try to stay at a   healthy weight. Get at least 30 minutes of exercise on most days, such as: Fast walking. Biking. Swimming. Medicines Take over-the-counter and prescription medicines only as told by your doctor. Avoid taking birth control pills. Talk to your doctor about the risks of taking birth control pills if: You are over 35 years old. You smoke. You get  very bad headaches. You have had a blood clot. Where to find more information American Stroke Association: www.strokeassociation.org Get help right away if: You or a loved one has any signs of a stroke. "BE FAST" is an easy way to remember the warning signs: B - Balance. Dizziness, sudden trouble walking, or loss of balance. E - Eyes. Trouble seeing or a change in how you see. F - Face. Sudden weakness or loss of feeling of the face. The face or eyelid may droop on one side. A - Arms. Weakness or loss of feeling in an arm. This happens all of a sudden and most often on one side of the body. S - Speech. Sudden trouble speaking, slurred speech, or trouble understanding what people say. T - Time. Time to call emergency services. Write down what time symptoms started. You or a loved one has other signs of a stroke, such as: A sudden, very bad headache with no known cause. Feeling like you may vomit (nausea). Vomiting. A seizure. These symptoms may be an emergency. Get help right away. Call your local emergency services (911 in the [place] Do not wait to see if the symptoms will go away. Do not drive yourself to the hospital. Summary You can help to prevent a stroke by eating healthy, exercising, and not smoking. It also helps to manage any health problems you have. Do not smoke or use any products that contain nicotine or tobacco. Get help right away if you or a loved one has any signs of a stroke. This information is not intended to replace advice given to you by your health care provider. Make sure you discuss any questions you have with your health care provider. Document Revised: 03/01/2020 Document Reviewed: 03/01/2020 Elsevier Patient Education  2023 Elsevier Inc.  

## 2022-04-03 NOTE — Assessment & Plan Note (Signed)
Refer to facial droop plan of care.

## 2022-04-04 DIAGNOSIS — R410 Disorientation, unspecified: Secondary | ICD-10-CM | POA: Insufficient documentation

## 2022-04-04 DIAGNOSIS — R29810 Facial weakness: Secondary | ICD-10-CM | POA: Diagnosis not present

## 2022-04-04 DIAGNOSIS — I6521 Occlusion and stenosis of right carotid artery: Secondary | ICD-10-CM | POA: Diagnosis not present

## 2022-04-04 DIAGNOSIS — R0902 Hypoxemia: Secondary | ICD-10-CM | POA: Diagnosis not present

## 2022-04-04 DIAGNOSIS — I639 Cerebral infarction, unspecified: Secondary | ICD-10-CM | POA: Insufficient documentation

## 2022-04-04 DIAGNOSIS — J9811 Atelectasis: Secondary | ICD-10-CM | POA: Diagnosis not present

## 2022-04-04 DIAGNOSIS — F1390 Sedative, hypnotic, or anxiolytic use, unspecified, uncomplicated: Secondary | ICD-10-CM | POA: Insufficient documentation

## 2022-04-04 DIAGNOSIS — R531 Weakness: Secondary | ICD-10-CM | POA: Diagnosis not present

## 2022-04-04 DIAGNOSIS — R590 Localized enlarged lymph nodes: Secondary | ICD-10-CM | POA: Diagnosis not present

## 2022-04-04 DIAGNOSIS — Z8673 Personal history of transient ischemic attack (TIA), and cerebral infarction without residual deficits: Secondary | ICD-10-CM | POA: Diagnosis not present

## 2022-04-04 DIAGNOSIS — R918 Other nonspecific abnormal finding of lung field: Secondary | ICD-10-CM | POA: Diagnosis not present

## 2022-04-04 DIAGNOSIS — J383 Other diseases of vocal cords: Secondary | ICD-10-CM | POA: Diagnosis not present

## 2022-04-05 DIAGNOSIS — I10 Essential (primary) hypertension: Secondary | ICD-10-CM | POA: Diagnosis not present

## 2022-04-05 DIAGNOSIS — R4182 Altered mental status, unspecified: Secondary | ICD-10-CM | POA: Diagnosis not present

## 2022-04-05 DIAGNOSIS — K047 Periapical abscess without sinus: Secondary | ICD-10-CM | POA: Diagnosis not present

## 2022-04-06 ENCOUNTER — Telehealth: Payer: Self-pay | Admitting: Nurse Practitioner

## 2022-04-06 NOTE — Telephone Encounter (Signed)
Pam calling from [facility] is calling to request orders for PT. Pt discharged from the hospital on 04/06/22 from Stroke admitted because of weakness and unsteady gate.  Please advise Cb- [phone] ok to leave verbal on VM

## 2022-04-07 NOTE — Telephone Encounter (Signed)
Spoke with Pam from [facility] and was provided with verbal orders per Jolene. Pam says she will fax over paperwork for the patient as well. Pam verbalized understanding as well.

## 2022-04-10 ENCOUNTER — Telehealth: Payer: Self-pay | Admitting: Nurse Practitioner

## 2022-04-10 NOTE — Telephone Encounter (Signed)
Copied from CRM [id number]. Topic: Medicare AWV >> Apr 10, 2022 10:00 AM Granville Lewis wrote: Reason for CRM:  N/A unable to leave a message for patient to call back and schedule the Medicare Annual Wellness Visit (AWV) virtually or by telephone.  Last AWV 03/18/21  Please schedule at anytime with [facility]-Nurse Health Advisor.  Any questions, please call me at [phone]

## 2022-04-14 ENCOUNTER — Inpatient Hospital Stay: Payer: Medicare HMO | Admitting: Nurse Practitioner

## 2022-04-21 ENCOUNTER — Telehealth: Payer: Self-pay | Admitting: Nurse Practitioner

## 2022-04-21 NOTE — Telephone Encounter (Signed)
Pt called saying the directions on his Gabapentin was supposed to be changed to 3 times a day and it still has one time a day.  CB@  [phone]

## 2022-04-24 NOTE — Telephone Encounter (Signed)
Let patient know what Jolene said. Patient states he will call back to schedule an appointment to discuss.

## 2022-04-26 ENCOUNTER — Ambulatory Visit: Payer: Medicare HMO

## 2022-05-16 ENCOUNTER — Ambulatory Visit (INDEPENDENT_AMBULATORY_CARE_PROVIDER_SITE_OTHER): Payer: Medicare HMO | Admitting: Nurse Practitioner

## 2022-05-16 ENCOUNTER — Encounter: Payer: Self-pay | Admitting: Nurse Practitioner

## 2022-05-16 DIAGNOSIS — Z8673 Personal history of transient ischemic attack (TIA), and cerebral infarction without residual deficits: Secondary | ICD-10-CM | POA: Diagnosis not present

## 2022-05-16 DIAGNOSIS — E785 Hyperlipidemia, unspecified: Secondary | ICD-10-CM | POA: Diagnosis not present

## 2022-05-16 DIAGNOSIS — I10 Essential (primary) hypertension: Secondary | ICD-10-CM | POA: Diagnosis not present

## 2022-05-16 DIAGNOSIS — F1111 Opioid abuse, in remission: Secondary | ICD-10-CM | POA: Diagnosis not present

## 2022-05-16 NOTE — Assessment & Plan Note (Signed)
History of and concern for current abuse due to recent UDS at [facility] noting oxycodone and Buprenorphine, he reports only taking a couple Percocet from his friend.  Have highly recommend he not use medications not prescribed to him as risk for other medications being mixed with these and risk for death in long term if abuse of medications present.  Plan for labs next visit.

## 2022-05-16 NOTE — Assessment & Plan Note (Signed)
Chronic, ongoing. At goal on recent office visits.  Continue current medication regimen and adjust as needed.  Recommend he monitor BP a few days a week at home and document for provider + focus on DASH diet.  LABS: up to date at [facility].  Return to office in 4 months.

## 2022-05-16 NOTE — Assessment & Plan Note (Signed)
Chronic, ongoing.  Continue current medication regimen and adjust as needed.  Lipid panel up to date. 

## 2022-05-16 NOTE — Patient Instructions (Signed)

## 2022-05-16 NOTE — Assessment & Plan Note (Signed)
H/O x 2 CVA.  Continue ASA and to monitor lipid panel, initiate statin if LDL >100 for prevention.

## 2022-05-16 NOTE — Progress Notes (Signed)
There were no vitals taken for this visit.   Subjective:    Patient ID: Mark [name], male    DOB: [birth year], 61 y.o.   MRN: [id number]  HPI: Mark [name] is a 61 y.o. male  Chief Complaint  Patient presents with   Follow-up    Patient says he was informed that he overdosed on a medication and patient denies that. Patient says is he doing fine. Patient says he has had 2 strokes and has been moving a little slower.   This visit was completed via telephone due to the restrictions of the COVID-19 pandemic. All issues as above were discussed and addressed but no physical exam was performed. If it was felt that the patient should be evaluated in the office, they were directed there. The patient verbally consented to this visit. Patient was unable to complete an audio/visual visit due to no access to video by patient. Due to the catastrophic nature of the COVID-19 pandemic, this visit was done through audio contact only. Location of the patient: home Location of the provider: home Those involved with this call:  Provider: Marnee Guarneri, DNP CMA: Irena Reichmann, [place] Desk/Registration: [employer]  Time spent on call:  21 minutes on the phone discussing health concerns. 15 minutes total spent in review of patient's record and preparation of their chart.  I verified patient identity using two factors (patient name and date of birth). Patient consents verbally to being seen via telemedicine visit today.    HYPERTENSION / HYPERLIPIDEMIA Seen in ER on 04/04/22 after advised in office to attend due to facial droop with history of CVA.  Went to [facility] diagnosed with right upper #3 dental abscess  --- UDS at time returned positive for oxycodone and Buprenorphine with an altered mental status, he did endorse taking some of his friends Percocet.  Has history of opioid abuse many years ago.  Imaging in ER noted no new stroke events, chronic infarcts noted.  Thickened right  vocal cord -- with recommendation for ENT for direct visualization.  He refuses this at this time.  He reports they took his tooth out and is doing better.  Denies any further pain or facial changes.  He does endorse he did take one of his friends Percocet, discussed at length with him today. Satisfied with current treatment? yes Duration of hypertension: chronic BP monitoring frequency: not checking BP range:  BP medication side effects: no Duration of hyperlipidemia: chronic Cholesterol medication side effects: no Cholesterol supplements: none Medication compliance: good compliance Aspirin: no Recent stressors: no Recurrent headaches: no Visual changes: no Palpitations: no Dyspnea: no Chest pain: no Lower extremity edema: no Dizzy/lightheaded: no   Relevant past medical, surgical, family and social history reviewed and updated as indicated. Interim medical history since our last visit reviewed. Allergies and medications reviewed and updated.  Review of Systems  Constitutional:  Negative for activity change, diaphoresis, fatigue and fever.  Respiratory:  Negative for cough, chest tightness, shortness of breath and wheezing.   Cardiovascular:  Negative for chest pain, palpitations and leg swelling.  Neurological: Negative.   Psychiatric/Behavioral: Negative.     Per HPI unless specifically indicated above     Objective:    There were no vitals taken for this visit.  Wt Readings from Last 3 Encounters:  04/03/22 156 lb 4.8 oz (70.9 kg)  11/14/21 156 lb 6.4 oz (70.9 kg)  03/27/21 140 lb (63.5 kg)    Physical Exam  Unable to  perform due to telephone only visit.  Results for orders placed or performed in visit on 11/14/21  Bayer DCA Hb A1c Waived  Result Value Ref Range   HB A1C (BAYER DCA - WAIVED) 5.3 4.8 - 5.6 %  CBC with Differential/Platelet  Result Value Ref Range   WBC 8.2 3.4 - 10.8 x10E3/uL   RBC 4.54 4.14 - 5.80 x10E6/uL   Hemoglobin 14.6 13.0 - 17.7 g/dL    Hematocrit 42.9 37.5 - 51.0 %   MCV 95 79 - 97 fL   MCH 32.2 26.6 - 33.0 pg   MCHC 34.0 31.5 - 35.7 g/dL   RDW 13.1 11.6 - 15.4 %   Platelets 326 150 - 450 x10E3/uL   Neutrophils 61 Not Estab. %   Lymphs 28 Not Estab. %   Monocytes 6 Not Estab. %   Eos 3 Not Estab. %   Basos 2 Not Estab. %   Neutrophils Absolute 5.0 1.4 - 7.0 x10E3/uL   Lymphocytes Absolute 2.3 0.7 - 3.1 x10E3/uL   Monocytes Absolute 0.5 0.1 - 0.9 x10E3/uL   EOS (ABSOLUTE) 0.3 0.0 - 0.4 x10E3/uL   Basophils Absolute 0.1 0.0 - 0.2 x10E3/uL   Immature Granulocytes 0 Not Estab. %   Immature Grans (Abs) 0.0 0.0 - 0.1 x10E3/uL  Comprehensive metabolic panel  Result Value Ref Range   Glucose 104 (H) 70 - 99 mg/dL   BUN 12 8 - 27 mg/dL   Creatinine, Ser 1.10 0.76 - 1.27 mg/dL   eGFR 76 >59 mL/min/1.73   BUN/Creatinine Ratio 11 10 - 24   Sodium 140 134 - 144 mmol/L   Potassium 4.5 3.5 - 5.2 mmol/L   Chloride 100 96 - 106 mmol/L   CO2 24 20 - 29 mmol/L   Calcium 9.4 8.6 - 10.2 mg/dL   Total Protein 7.0 6.0 - 8.5 g/dL   Albumin 4.6 3.8 - 4.8 g/dL   Globulin, Total 2.4 1.5 - 4.5 g/dL   Albumin/Globulin Ratio 1.9 1.2 - 2.2   Bilirubin Total 0.3 0.0 - 1.2 mg/dL   Alkaline Phosphatase 126 (H) 44 - 121 IU/L   AST 22 0 - 40 IU/L   ALT 25 0 - 44 IU/L  Lipid Panel w/o Chol/HDL Ratio  Result Value Ref Range   Cholesterol, Total 142 100 - 199 mg/dL   Triglycerides 110 0 - 149 mg/dL   HDL 43 >39 mg/dL   VLDL Cholesterol Cal 20 5 - 40 mg/dL   LDL Chol Calc (NIH) 79 0 - 99 mg/dL  TSH  Result Value Ref Range   TSH 3.090 0.450 - 4.500 uIU/mL  VITAMIN D 25 Hydroxy (Vit-D Deficiency, Fractures)  Result Value Ref Range   Vit D, 25-Hydroxy 23.0 (L) 30.0 - 100.0 ng/mL  Vitamin B12  Result Value Ref Range   Vitamin B-12 1,501 (H) 232 - 1,245 pg/mL  PSA  Result Value Ref Range   Prostate Specific Ag, Serum 0.5 0.0 - 4.0 ng/mL  Magnesium  Result Value Ref Range   Magnesium 2.0 1.6 - 2.3 mg/dL      Assessment & Plan:    Problem List Items Addressed This Visit       Cardiovascular and Mediastinum   Hypertension    Chronic, ongoing. At goal on recent office visits.  Continue current medication regimen and adjust as needed.  Recommend he monitor BP a few days a week at home and document for provider + focus on DASH diet.  LABS: up to date at  [facility].  Return to office in 4 months.          Other   History of CVA (cerebrovascular accident)    H/O x 2 CVA.  Continue ASA and to monitor lipid panel, initiate statin if LDL >100 for prevention.      History of opioid abuse ([place]) - Primary    History of and concern for current abuse due to recent UDS at [facility] noting oxycodone and Buprenorphine, he reports only taking a couple Percocet from his friend.  Have highly recommend he not use medications not prescribed to him as risk for other medications being mixed with these and risk for death in long term if abuse of medications present.  Plan for labs next visit.      Hyperlipidemia LDL goal <70    Chronic, ongoing.  Continue current medication regimen and adjust as needed.  Lipid panel up to date.       I discussed the assessment and treatment plan with the patient. The patient was provided an opportunity to ask questions and all were answered. The patient agreed with the plan and demonstrated an understanding of the instructions.   The patient was advised to call back or seek an in-person evaluation if the symptoms worsen or if the condition fails to improve as anticipated.   I provided 21+ minutes of time during this encounter.    Follow up plan: Return in about 4 months (around 09/16/2022) for HTN/HLD, EMPHYSEMA, B12, HISTORY OF CVA.

## 2022-05-16 NOTE — Progress Notes (Signed)
LVM asking patient to call back to schedule follow up appointment  ?

## 2022-05-18 NOTE — Progress Notes (Signed)
2nd attempt to reach patient to schedule an appointment 

## 2022-05-19 NOTE — Progress Notes (Signed)
Call pat. To schedule unable to schedule voice mail not set up.

## 2022-07-23 NOTE — Patient Instructions (Signed)

## 2022-07-25 ENCOUNTER — Encounter: Payer: Self-pay | Admitting: Nurse Practitioner

## 2022-07-25 ENCOUNTER — Ambulatory Visit (INDEPENDENT_AMBULATORY_CARE_PROVIDER_SITE_OTHER): Payer: Medicare HMO | Admitting: Nurse Practitioner

## 2022-07-25 VITALS — BP 104/72 | HR 64 | Temp 98.3°F | Ht 69.02 in | Wt 151.9 lb

## 2022-07-25 DIAGNOSIS — J438 Other emphysema: Secondary | ICD-10-CM | POA: Diagnosis not present

## 2022-07-25 DIAGNOSIS — E785 Hyperlipidemia, unspecified: Secondary | ICD-10-CM | POA: Diagnosis not present

## 2022-07-25 DIAGNOSIS — N400 Enlarged prostate without lower urinary tract symptoms: Secondary | ICD-10-CM | POA: Diagnosis not present

## 2022-07-25 DIAGNOSIS — R4189 Other symptoms and signs involving cognitive functions and awareness: Secondary | ICD-10-CM

## 2022-07-25 DIAGNOSIS — Z8673 Personal history of transient ischemic attack (TIA), and cerebral infarction without residual deficits: Secondary | ICD-10-CM

## 2022-07-25 DIAGNOSIS — F17210 Nicotine dependence, cigarettes, uncomplicated: Secondary | ICD-10-CM

## 2022-07-25 DIAGNOSIS — R7309 Other abnormal glucose: Secondary | ICD-10-CM

## 2022-07-25 DIAGNOSIS — I700 Atherosclerosis of aorta: Secondary | ICD-10-CM | POA: Diagnosis not present

## 2022-07-25 DIAGNOSIS — F1111 Opioid abuse, in remission: Secondary | ICD-10-CM

## 2022-07-25 DIAGNOSIS — I10 Essential (primary) hypertension: Secondary | ICD-10-CM | POA: Diagnosis not present

## 2022-07-25 DIAGNOSIS — Z0000 Encounter for general adult medical examination without abnormal findings: Secondary | ICD-10-CM | POA: Diagnosis not present

## 2022-07-25 DIAGNOSIS — R911 Solitary pulmonary nodule: Secondary | ICD-10-CM

## 2022-07-25 DIAGNOSIS — Z7189 Other specified counseling: Secondary | ICD-10-CM

## 2022-07-25 DIAGNOSIS — F17219 Nicotine dependence, cigarettes, with unspecified nicotine-induced disorders: Secondary | ICD-10-CM

## 2022-07-25 DIAGNOSIS — I69920 Aphasia following unspecified cerebrovascular disease: Secondary | ICD-10-CM

## 2022-07-25 DIAGNOSIS — F4321 Adjustment disorder with depressed mood: Secondary | ICD-10-CM

## 2022-07-25 MED ORDER — PRAVASTATIN SODIUM 20 MG PO TABS: 20.0000 mg | ORAL_TABLET | Freq: Every day | ORAL | 4 refills | Status: DC

## 2022-07-25 MED ORDER — MELATONIN 10 MG PO TABS: 10.0000 mg | ORAL_TABLET | Freq: Every day | ORAL | 4 refills | Status: AC

## 2022-07-25 MED ORDER — GABAPENTIN 300 MG PO CAPS: 300.0000 mg | ORAL_CAPSULE | Freq: Three times a day (TID) | ORAL | 4 refills | Status: DC

## 2022-07-25 MED ORDER — METOPROLOL TARTRATE 25 MG PO TABS: 25.0000 mg | ORAL_TABLET | Freq: Two times a day (BID) | ORAL | 4 refills | Status: DC

## 2022-07-25 MED ORDER — TAMSULOSIN HCL 0.4 MG PO CAPS: 0.4000 mg | ORAL_CAPSULE | Freq: Every day | ORAL | 4 refills | Status: DC

## 2022-07-25 MED ORDER — CITALOPRAM HYDROBROMIDE 20 MG PO TABS
20.0000 mg | ORAL_TABLET | Freq: Every day | ORAL | 4 refills | Status: DC
Start: 2022-07-25 — End: 2023-06-19

## 2022-07-25 MED ORDER — AMLODIPINE BESYLATE 10 MG PO TABS: 10.0000 mg | ORAL_TABLET | Freq: Every day | ORAL | 4 refills | Status: DC

## 2022-07-25 NOTE — Assessment & Plan Note (Signed)
Ongoing, has occasional difficulty with word finding and speech. Memory affected, but remains stable on exam with 6CIT = 10.  Monitor closely on exams.

## 2022-07-25 NOTE — Assessment & Plan Note (Signed)
H/O x 2 CVA.  Continue ASA and to monitor lipid panel. Goal A1c <6.5%, BP <130/90, and LDL <70 for prevention.

## 2022-07-25 NOTE — Assessment & Plan Note (Signed)
Ongoing since CVA -- continue to monitor closely for worsening and return to neurology as needed.

## 2022-07-25 NOTE — Progress Notes (Addendum)
BP 104/72   Pulse 64   Temp 98.3 F (36.8 C) (Oral)   Ht 5' 9.02" (1.753 m)   Wt 151 lb 14.4 oz (68.9 kg)   SpO2 99%   BMI 22.42 kg/m    Subjective:    Patient ID: Mark [name], male    DOB: Jul 25, 1961, 61 y.o.   MRN: [id number]  HPI: Mark [name] is a 61 y.o. male presenting on 07/25/2022 for follow-up visit and Medicare Wellness. Current medical complaints include:none  He currently lives with: self Interim Problems from his last visit: no  HYPERTENSION / HYPERLIPIDEMIA Continues on Amlodipine and Metoprolol daily. Has history of elevation in LFT due to alcohol intake, his recent labs have been stable and no alcohol use in years.  Last A1c 5.2% on check, history of elevations.  History of CVA x 2 in 2014. Satisfied with current treatment? yes Duration of hypertension: chronic BP monitoring frequency: occasional BP range: 110/70 range BP medication side effects: no Duration of hyperlipidemia: chronic Cholesterol medication side effects: no Cholesterol supplements: none Medication compliance: good compliance Aspirin: yes Recent stressors: no Recurrent headaches: no Visual changes: no Palpitations: no Dyspnea: no Chest pain: no Lower extremity edema: no Dizzy/lightheaded: no  The ASCVD Risk score (Arnett DK, et al., 2019) failed to calculate for the following reasons:   The patient has a prior MI or stroke diagnosis   COPD Had lung screening September 2021 -- noted emphysema and aortic atherosclerosis + a 2.8 cm left lower lobe nodule -- to continue yearly screening, has missed 2 years. Smokes about 1 PPD, has been smoking since age 78.  No current inhalers or symptoms.   COPD status: stable Satisfied with current treatment?: yes Oxygen use: no Dyspnea frequency: none Cough frequency: none Rescue inhaler frequency:  none Limitation of activity: no Productive cough: none Last Spirometry: none Pneumovax: Not up to Date Influenza:  refuses  BPH Continues on Flomax. BPH status: controlled Satisfied with current treatment?: yes Medication side effects: no Medication compliance: good compliance Duration: chronic Nocturia: 1/night Urinary frequency:no Incomplete voiding: no Urgency: no Weak urinary stream: no Straining to start stream: no Dysuria: no Onset: gradual Severity: mild Alleviating factors: Flomax Aggravating factors: none Treatments attempted: Flomax IPSS Questionnaire (AUA-7): Over the past month.   1)  How often have you had a sensation of not emptying your bladder completely after you finish urinating?  0 - Not at all  2)  How often have you had to urinate again less than two hours after you finished urinating? 0 - Not at all  3)  How often have you found you stopped and started again several times when you urinated?  0 - Not at all  4) How difficult have you found it to postpone urination?  0 - Not at all  5) How often have you had a weak urinary stream?  0 - Not at all  6) How often have you had to push or strain to begin urination?  0 - Not at all  7) How many times did you most typically get up to urinate from the time you went to bed until the time you got up in the morning?  1 - 1 time  Total score:  0-7 mildly symptomatic   8-19 moderately symptomatic   20-35 severely symptomatic      ANXIETY/STRESS Continues on Celexa daily with benefit, has been on since losing his mother over two years ago.  Lives on own in mother's  house.  Currently taking Gabapentin for sleep and back pain, would like to change to 300 MG TID.   Duration:controlled Anxious mood: sometimes  Excessive worrying: no Irritability: no  Sweating: no Nausea: no Palpitations:no Hyperventilation: no Panic attacks: no Agoraphobia: no  Obscessions/compulsions: no Depressed mood: no    06/09/2022    9:42 AM 03/22/2021   11:08 AM 03/18/2021    2:37 PM 12/07/2020    2:07 PM 11/15/2020    2:28 PM  Depression screen PHQ 2/9   Decreased Interest 0 0 0 3 0  Down, Depressed, Hopeless 0  0 3 3  PHQ - 2 Score 0 0 0 6 3  Altered sleeping 0   0 3  Tired, decreased energy 0   0 0  Change in appetite 0   0 0  Feeling bad or failure about yourself  0   0 0  Trouble concentrating 0   1 0  Moving slowly or fidgety/restless 0   0 0  Suicidal thoughts 0   0 0  PHQ-9 Score 0   7 6  Difficult doing work/chores Not difficult at all      Anhedonia: no Weight changes: no Insomnia: yes hard to fall asleep  Hypersomnia: no Fatigue/loss of energy: no Feelings of worthlessness: no Feelings of guilt: no Impaired concentration/indecisiveness: no Suicidal ideations: no  Crying spells: no Recent Stressors/Life Changes: no   Relationship problems: no   Family stress: no     Financial stress: no    Job stress: no    Recent death/loss: no     06-09-22    9:43 AM 01/04/2021    2:08 PM 11/15/2020    2:29 PM  GAD 7 : Generalized Anxiety Score  Nervous, Anxious, on Edge 0 0 0  Control/stop worrying 0 0 0  Worry too much - different things 1 0 0  Trouble relaxing 2 0 0  Restless 0 0 0  Easily annoyed or irritable 0 1 0  Afraid - awful might happen 0 0 0  Total GAD 7 Score 3 1 0  Anxiety Difficulty Not difficult at all Not difficult at all    Functional Status Survey: Is the patient deaf or have difficulty hearing?: No Does the patient have difficulty seeing, even when wearing glasses/contacts?: No Does the patient have difficulty concentrating, remembering, or making decisions?: No Does the patient have difficulty walking or climbing stairs?: No Does the patient have difficulty dressing or bathing?: No Does the patient have difficulty doing errands alone such as visiting a doctor's office or shopping?: No  FALL RISK:    03/22/2021   11:08 AM 03/18/2021    2:36 PM 09/15/2020    1:46 PM 02/09/2020    9:51 AM 06/04/2018   12:59 PM  [place] in the past year? 0 1 0 0 No  Comment  tripped over the coffee table      Number falls in past yr: 0 0     Injury with Fall? 0 0     Risk for fall due to : No Fall Risks Medication side effect  Medication side effect   Follow up Falls evaluation completed Falls evaluation completed;Education provided;Falls prevention discussed  Falls evaluation completed;Education provided;Falls prevention discussed     Depression Screen    2022-06-09    9:42 AM 03/22/2021   11:08 AM 03/18/2021    2:37 PM 12/07/2020    2:07 PM 11/15/2020    2:28 PM  Depression  screen PHQ 2/9  Decreased Interest 0 0 0 3 0  Down, Depressed, Hopeless 0  0 3 3  PHQ - 2 Score 0 0 0 6 3  Altered sleeping 0   0 3  Tired, decreased energy 0   0 0  Change in appetite 0   0 0  Feeling bad or failure about yourself  0   0 0  Trouble concentrating 0   1 0  Moving slowly or fidgety/restless 0   0 0  Suicidal thoughts 0   0 0  PHQ-9 Score 0   7 6  Difficult doing work/chores Not difficult at all       Advanced Directives A voluntary discussion about advance care planning including the explanation and discussion of advance directives was extensively discussed  with the patient for 15 minutes with patient.  Explanation about the health care proxy and Living will was reviewed and packet with forms with explanation of how to fill them out was given.  During this discussion, the patient was able to identify a health care proxy as son and plans to fill out the paperwork required.  Patient was offered a separate [place] visit for further assistance with forms.     Past Medical History:  Past Medical History:  Diagnosis Date   Depression    History of kidney stones    Hypertension    Stroke [facility])     Surgical History:  Past Surgical History:  Procedure Laterality Date   APPENDECTOMY     bowel obstruction     COLONOSCOPY N/A 09/07/2020   Procedure: COLONOSCOPY;  Surgeon: Virgel Manifold, MD;  Location: [facility] ENDOSCOPY;  Service: Endoscopy;  Laterality: N/A;   ESOPHAGOGASTRODUODENOSCOPY  (EGD) WITH PROPOFOL N/A 09/03/2020   Procedure: ESOPHAGOGASTRODUODENOSCOPY (EGD) WITH PROPOFOL;  Surgeon: Lin Landsman, MD;  Location: [facility] ENDOSCOPY;  Service: Gastroenterology;  Laterality: N/A;   EXTRACORPOREAL SHOCK WAVE LITHOTRIPSY Right 05/15/2019   Procedure: EXTRACORPOREAL SHOCK WAVE LITHOTRIPSY (ESWL);  Surgeon: Cleon Gustin, MD;  Location: [facility] ORS;  Service: Urology;  Laterality: Right;    Medications:  Current Outpatient Medications on File Prior to Visit  Medication Sig   Cholecalciferol (VITAMIN D3) 25 MCG (1000 UT) CAPS Take 2 capsules (2,000 Units total) by mouth daily.   cyanocobalamin 1000 MCG tablet Take 1 tablet (1,000 mcg total) by mouth daily.   ferrous sulfate 325 (65 FE) MG EC tablet Take 1 tablet (325 mg total) by mouth daily with breakfast.   omeprazole (PRILOSEC) 20 MG capsule TAKE 1 CAPSULE BY MOUTH EVERY DAY   No current facility-administered medications on file prior to visit.    Allergies:  Allergies  Allergen Reactions   Codeine Hives    Social History:  Social History   Socioeconomic History   Marital status: Divorced    Spouse name: Not on file   Number of children: Not on file   Years of education: Not on file   Highest education level: Not on file  Occupational History   Occupation: retired  Tobacco Use   Smoking status: Every Day    Packs/day: 1.00    Years: 43.00    Total pack years: 43.00    Types: Cigarettes   Smokeless tobacco: Never  Vaping Use   Vaping Use: Never used  Substance and Sexual Activity   Alcohol use: Not Currently    Alcohol/week: 0.0 standard drinks of alcohol   Drug use: Not Currently   Sexual activity: Not on  file  Other Topics Concern   Not on file  Social History Narrative   Not on file   Social Determinants of Health   Financial Resource Strain: Low Risk  (07/25/2022)   Overall Financial Resource Strain (CARDIA)    Difficulty of Paying Living Expenses: Not hard at all  Food Insecurity: No  Food Insecurity (07/25/2022)   Hunger Vital Sign    Worried About Running Out of Food in the Last Year: Never true    Ran Out of Food in the Last Year: Never true  Transportation Needs: No Transportation Needs (07/25/2022)   PRAPARE - Hydrologist (Medical): No    Lack of Transportation (Non-Medical): No  Physical Activity: Sufficiently Active (07/25/2022)   Exercise Vital Sign    Days of Exercise per Week: 4 days    Minutes of Exercise per Session: 40 min  Stress: No Stress Concern Present (07/25/2022)   [place]    Feeling of Stress : Only a little  Social Connections: Socially Isolated (07/25/2022)   Social Connection and Isolation Panel [NHANES]    Frequency of Communication with Friends and Family: More than three times a week    Frequency of Social Gatherings with Friends and Family: More than three times a week    Attends Religious Services: Never    Marine scientist or Organizations: No    Attends Archivist Meetings: Never    Marital Status: Never married  Intimate Partner Violence: Not At Risk (07/25/2022)   Humiliation, Afraid, Rape, and Kick questionnaire    Fear of Current or Ex-Partner: No    Emotionally Abused: No    Physically Abused: No    Sexually Abused: No   Social History   Tobacco Use  Smoking Status Every Day   Packs/day: 1.00   Years: 43.00   Total pack years: 43.00   Types: Cigarettes  Smokeless Tobacco Never   Social History   Substance and Sexual Activity  Alcohol Use Not Currently   Alcohol/week: 0.0 standard drinks of alcohol    Family History:  Family History  Problem Relation Age of Onset   Heart disease Father    Seizures Brother     Past medical history, surgical history, medications, allergies, family history and social history reviewed with patient today and changes made to appropriate areas of the chart.   ROS All  other ROS negative except what is listed above and in the HPI.      Objective:    BP 104/72   Pulse 64   Temp 98.3 F (36.8 C) (Oral)   Ht 5' 9.02" (1.753 m)   Wt 151 lb 14.4 oz (68.9 kg)   SpO2 99%   BMI 22.42 kg/m   Wt Readings from Last 3 Encounters:  07/25/22 151 lb 14.4 oz (68.9 kg)  04/03/22 156 lb 4.8 oz (70.9 kg)  11/14/21 156 lb 6.4 oz (70.9 kg)    No results found.  Physical Exam Vitals and nursing note reviewed.  Constitutional:      General: He is awake. He is not in acute distress.    Appearance: He is well-developed and well-groomed. He is not ill-appearing or toxic-appearing.  HENT:     Head: Normocephalic and atraumatic.     Right Ear: Hearing, tympanic membrane, ear canal and external ear normal. No drainage.     Left Ear: Hearing, tympanic membrane, ear canal and external ear normal.  No drainage.     Nose: Nose normal.     Mouth/Throat:     Pharynx: Uvula midline.  Eyes:     General: Lids are normal.        Right eye: No discharge.        Left eye: No discharge.     Extraocular Movements: Extraocular movements intact.     Conjunctiva/sclera: Conjunctivae normal.     Pupils: Pupils are equal, round, and reactive to light.     Visual Fields: Right eye visual fields normal and left eye visual fields normal.  Neck:     Thyroid: No thyromegaly.     Vascular: No carotid bruit or JVD.     Trachea: Trachea normal.  Cardiovascular:     Rate and Rhythm: Normal rate and regular rhythm.     Heart sounds: Normal heart sounds, S1 normal and S2 normal. No murmur heard.    No gallop.  Pulmonary:     Effort: Pulmonary effort is normal. No accessory muscle usage or respiratory distress.     Breath sounds: Normal breath sounds.  Abdominal:     General: Bowel sounds are normal.     Palpations: Abdomen is soft. There is no hepatomegaly or splenomegaly.     Tenderness: There is no abdominal tenderness.  Musculoskeletal:        General: Normal range of motion.      Cervical back: Normal range of motion and neck supple.     Right lower leg: No edema.     Left lower leg: No edema.  Lymphadenopathy:     Head:     Right side of head: No submental, submandibular, tonsillar, preauricular or posterior auricular adenopathy.     Left side of head: No submental, submandibular, tonsillar, preauricular or posterior auricular adenopathy.     Cervical: No cervical adenopathy.  Skin:    General: Skin is warm and dry.     Capillary Refill: Capillary refill takes less than 2 seconds.     Findings: No rash.  Neurological:     Mental Status: He is alert and oriented to person, place, and time.     Gait: Gait is intact.     Deep Tendon Reflexes: Reflexes are normal and symmetric.     Reflex Scores:      Brachioradialis reflexes are 2+ on the right side and 2+ on the left side.      Patellar reflexes are 2+ on the right side and 2+ on the left side. Psychiatric:        Attention and Perception: Attention normal.        Mood and Affect: Mood normal.        Speech: Speech normal.        Behavior: Behavior normal. Behavior is cooperative.        Thought Content: Thought content normal.        Cognition and Memory: Cognition normal.       07/25/2022   11:07 AM 03/18/2021    2:39 PM 05/30/2017    3:18 PM  6CIT Screen  What Year? 0 points 0 points 0 points  What month? 0 points 0 points 0 points  What time? 0 points 0 points 0 points  Count back from 20 2 points 2 points 0 points  Months in reverse 2 points 4 points 0 points  Repeat phrase 6 points 6 points 2 points  Total Score 10 points 12 points 2 points   Results for orders  placed or performed in visit on 11/14/21  [place] DCA Hb A1c Waived  Result Value Ref Range   HB A1C (BAYER DCA - WAIVED) 5.3 4.8 - 5.6 %  CBC with Differential/Platelet  Result Value Ref Range   WBC 8.2 3.4 - 10.8 x10E3/uL   RBC 4.54 4.14 - 5.80 x10E6/uL   Hemoglobin 14.6 13.0 - 17.7 g/dL   Hematocrit 42.9 37.5 - 51.0 %   MCV 95 79  - 97 fL   MCH 32.2 26.6 - 33.0 pg   MCHC 34.0 31.5 - 35.7 g/dL   RDW 13.1 11.6 - 15.4 %   Platelets 326 150 - 450 x10E3/uL   Neutrophils 61 Not Estab. %   Lymphs 28 Not Estab. %   Monocytes 6 Not Estab. %   Eos 3 Not Estab. %   Basos 2 Not Estab. %   Neutrophils Absolute 5.0 1.4 - 7.0 x10E3/uL   Lymphocytes Absolute 2.3 0.7 - 3.1 x10E3/uL   Monocytes Absolute 0.5 0.1 - 0.9 x10E3/uL   EOS (ABSOLUTE) 0.3 0.0 - 0.4 x10E3/uL   Basophils Absolute 0.1 0.0 - 0.2 x10E3/uL   Immature Granulocytes 0 Not Estab. %   Immature Grans (Abs) 0.0 0.0 - 0.1 x10E3/uL  Comprehensive metabolic panel  Result Value Ref Range   Glucose 104 (H) 70 - 99 mg/dL   BUN 12 8 - 27 mg/dL   Creatinine, Ser 1.10 0.76 - 1.27 mg/dL   eGFR 76 >59 mL/min/1.73   BUN/Creatinine Ratio 11 10 - 24   Sodium 140 134 - 144 mmol/L   Potassium 4.5 3.5 - 5.2 mmol/L   Chloride 100 96 - 106 mmol/L   CO2 24 20 - 29 mmol/L   Calcium 9.4 8.6 - 10.2 mg/dL   Total Protein 7.0 6.0 - 8.5 g/dL   Albumin 4.6 3.8 - 4.8 g/dL   Globulin, Total 2.4 1.5 - 4.5 g/dL   Albumin/Globulin Ratio 1.9 1.2 - 2.2   Bilirubin Total 0.3 0.0 - 1.2 mg/dL   Alkaline Phosphatase 126 (H) 44 - 121 IU/L   AST 22 0 - 40 IU/L   ALT 25 0 - 44 IU/L  Lipid Panel w/o Chol/HDL Ratio  Result Value Ref Range   Cholesterol, Total 142 100 - 199 mg/dL   Triglycerides 110 0 - 149 mg/dL   HDL 43 >39 mg/dL   VLDL Cholesterol Cal 20 5 - 40 mg/dL   LDL Chol Calc (NIH) 79 0 - 99 mg/dL  TSH  Result Value Ref Range   TSH 3.090 0.450 - 4.500 uIU/mL  VITAMIN D 25 Hydroxy (Vit-D Deficiency, Fractures)  Result Value Ref Range   Vit D, 25-Hydroxy 23.0 (L) 30.0 - 100.0 ng/mL  Vitamin B12  Result Value Ref Range   Vitamin B-12 1,501 (H) 232 - 1,245 pg/mL  PSA  Result Value Ref Range   Prostate Specific Ag, Serum 0.5 0.0 - 4.0 ng/mL  Magnesium  Result Value Ref Range   Magnesium 2.0 1.6 - 2.3 mg/dL      Assessment & Plan:   Problem List Items Addressed This Visit        Cardiovascular and Mediastinum   Aortic atherosclerosis (HCC)    Chronic.  Noted on past lung screening.  Educated patient on this.  Recommend complete cessation of smoking + continue daily statin & ASA for prevention.      Relevant Medications   amLODipine (NORVASC) 10 MG tablet   metoprolol tartrate (LOPRESSOR) 25 MG tablet   pravastatin (PRAVACHOL)  20 MG tablet   Other Relevant Orders   Comprehensive metabolic panel   Lipid Panel w/o Chol/HDL Ratio   Hypertension    Chronic, stable. BP at goal in office today.  Continue current medication regimen and adjust as needed.  Recommend he monitor BP a few days a week at home and document for provider + focus on DASH diet.  LABS: CMP.  Return to office in 6 months.        Relevant Medications   amLODipine (NORVASC) 10 MG tablet   metoprolol tartrate (LOPRESSOR) 25 MG tablet   pravastatin (PRAVACHOL) 20 MG tablet   Other Relevant Orders   Comprehensive metabolic panel   Lipid Panel w/o Chol/HDL Ratio     Respiratory   Paraseptal emphysema (HCC)    Chronic, no current inhalers or symptoms.  Long time smoker.  Have recommended complete cessation.  Will plan on spirometry at upcoming visits, refuses today.  Continue yearly lung CT CA screening -- new referral placed and recommend he attend.  Initiate inhaler regimen as needed.  CBC up to date.      Relevant Orders   Ambulatory Referral Lung Cancer Screening [facility]   Pulmonary nodule    Continue yearly lung screening to monitor -- recommend complete cessation of smoking.  New referral for screening placed and recommend he attend.        Nervous and Auditory   Nicotine dependence, cigarettes, w unsp disorders    I have recommended complete cessation of tobacco use. I have discussed various options available for assistance with tobacco cessation including over the counter methods (Nicotine gum, patch and lozenges). We also discussed prescription options (Chantix, Nicotine  Inhaler / Nasal Spray). The patient is not interested in pursuing any prescription tobacco cessation options at this time.  Recommend he continue yearly lung screening.       Relevant Orders   Ambulatory Referral Lung Cancer Screening [facility]     Genitourinary   Benign prostatic hyperplasia without lower urinary tract symptoms    Chronic, stable.  Continue Flomax nightly, refills sent in.  PSA on labs up to date.      Relevant Medications   tamsulosin (FLOMAX) 0.4 MG CAPS capsule     Other   Advanced care planning/counseling discussion    Discussion with patient today and paperwork sent home with patient.      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 Memory affected, but remains stable on exam with 6CIT = 10.  Monitor closely on exams.      Cognitive impairment    Ongoing since CVA -- continue to monitor closely for worsening and return to neurology as needed.      Elevated hemoglobin A1c    A1c remains stable today at 5.7% last check, recheck today.  Continue diet focus.        Relevant Orders   HgB A1c   Grief reaction    Loss of his mother 2 years ago almost, who was main support person.  Denies SI/HI.  Has support in son and brother locally.  Continue Celexa daily 20 MG, may change to Zoloft in future if needed for further up titration.  Offered supportive listening.  Will increase Gabapentin to 300 MG TID for sleep and anxiety, this has been working well for him, but would like to trial TID dosing.  Return in 6 months.      History  of CVA (cerebrovascular accident)    H/O x 2 CVA.  Continue ASA and to monitor lipid panel. Goal A1c <6.5%, BP <130/90, and LDL <70 for prevention.        History of opioid abuse ([place])    History of and concern for current abuse due to recent UDS at [facility] noting oxycodone and Buprenorphine, he reports only taking a couple Percocet from his friend at  time and none since.  Have highly recommend he not use medications not prescribed to him as risk for other medications being mixed with these and risk for death in long term abuse of medications present. Monitor closely.  He refuses rehab or counseling.      Hyperlipidemia LDL goal <70    Chronic, ongoing.  Continue current medication regimen and adjust as needed.  Lipid panel today.      Relevant Medications   amLODipine (NORVASC) 10 MG tablet   metoprolol tartrate (LOPRESSOR) 25 MG tablet   pravastatin (PRAVACHOL) 20 MG tablet   Other Relevant Orders   Comprehensive metabolic panel   Lipid Panel w/o Chol/HDL Ratio   Other Visit Diagnoses     Encounter for Medicare annual wellness exam    -  Primary   Due for in August 2023, performed today at patient visit and discussed with them       LABORATORY TESTING:  Health maintenance labs ordered today as discussed above.   IMMUNIZATIONS:   - Tdap: Tetanus vaccination status reviewed: last tetanus booster within 10 years. - Influenza: Refused - Pneumovax: Refused - Prevnar: Refused - Zostavax vaccine: Refused - Covid: refused  SCREENING: - Colonoscopy: Up to date  Discussed with patient purpose of the colonoscopy is to detect colon cancer at curable precancerous or early stages   - AAA Screening: Not applicable  -Hearing Test: Not applicable  -Spirometry: Up to date   PATIENT COUNSELING:    Sexuality: Discussed sexually transmitted diseases, partner selection, use of condoms, avoidance of unintended pregnancy  and contraceptive alternatives.   Advised to avoid cigarette smoking.  I discussed with the patient that most people either abstain from alcohol or drink within safe limits (<=14/week and <=4 drinks/occasion for males, <=7/weeks and <= 3 drinks/occasion for females) and that the risk for alcohol disorders and other health effects rises proportionally with the number of drinks per week and how often a drinker exceeds  daily limits.  Discussed cessation/primary prevention of drug use and availability of treatment for abuse.   Diet: Encouraged to adjust caloric intake to maintain  or achieve ideal body weight, to reduce intake of dietary saturated fat and total fat, to limit sodium intake by avoiding high sodium foods and not adding table salt, and to maintain adequate dietary potassium and calcium preferably from fresh fruits, vegetables, and low-fat dairy products.    Stressed the importance of regular exercise  Injury prevention: Discussed safety belts, safety helmets, smoke detector, smoking near bedding or upholstery.   Dental health: Discussed importance of regular tooth brushing, flossing, and dental visits.   Follow up plan: NEXT PREVENTATIVE PHYSICAL DUE IN 1 YEAR. Return in about 6 months (around 01/24/2023) for HTN/HLD, COPD, MOOD, BACK PAIN -- spirometry needed.

## 2022-07-25 NOTE — Assessment & Plan Note (Signed)
Continue yearly lung screening to monitor -- recommend complete cessation of smoking.  New referral for screening placed and recommend he attend.

## 2022-07-25 NOTE — Assessment & Plan Note (Signed)
A1c remains stable today at 5.7% last check, recheck today.  Continue diet focus.

## 2022-07-25 NOTE — Assessment & Plan Note (Signed)
Loss of his mother 2 years ago almost, who was main support person.  Denies SI/HI.  Has support in son and brother locally.  Continue Celexa daily 20 MG, may change to Zoloft in future if needed for further up titration.  Offered supportive listening.  Will increase Gabapentin to 300 MG TID for sleep and anxiety, this has been working well for him, but would like to trial TID dosing.  Return in 6 months.

## 2022-07-25 NOTE — Assessment & Plan Note (Signed)
Discussion with patient today and paperwork sent home with patient.

## 2022-07-25 NOTE — Assessment & Plan Note (Signed)
I have recommended complete cessation of tobacco use. I have discussed various options available for assistance with tobacco cessation including over the counter methods (Nicotine gum, patch and lozenges). We also discussed prescription options (Chantix, Nicotine Inhaler / Nasal Spray). The patient is not interested in pursuing any prescription tobacco cessation options at this time.  Recommend he continue yearly lung screening.

## 2022-07-25 NOTE — Assessment & Plan Note (Signed)
Chronic, stable.  Continue Flomax nightly, refills sent in.  PSA on labs up to date.

## 2022-07-25 NOTE — Assessment & Plan Note (Signed)
Chronic, stable. BP at goal in office today.  Continue current medication regimen and adjust as needed.  Recommend he monitor BP a few days a week at home and document for provider + focus on DASH diet.  LABS: CMP.  Return to office in 6 months.

## 2022-07-25 NOTE — Assessment & Plan Note (Signed)
History of and concern for current abuse due to recent UDS at [facility] noting oxycodone and Buprenorphine, he reports only taking a couple Percocet from his friend at time and none since.  Have highly recommend he not use medications not prescribed to him as risk for other medications being mixed with these and risk for death in long term abuse of medications present. Monitor closely.  He refuses rehab or counseling.

## 2022-07-25 NOTE — Assessment & Plan Note (Addendum)
Chronic.  Noted on past lung screening.  Educated patient on this.  Recommend complete cessation of smoking + continue daily statin & ASA for prevention.

## 2022-07-25 NOTE — Assessment & Plan Note (Signed)
Chronic, ongoing.  Continue current medication regimen and adjust as needed. Lipid panel today. 

## 2022-07-25 NOTE — Assessment & Plan Note (Signed)
Chronic, no current inhalers or symptoms.  Long time smoker.  Have recommended complete cessation.  Will plan on spirometry at upcoming visits, refuses today.  Continue yearly lung CT CA screening -- new referral placed and recommend he attend.  Initiate inhaler regimen as needed.  CBC up to date.

## 2022-07-26 LAB — COMPREHENSIVE METABOLIC PANEL
ALT: 38 IU/L (ref 0–44)
AST: 28 IU/L (ref 0–40)
Albumin/Globulin Ratio: 1.5 (ref 1.2–2.2)
Albumin: 4.3 g/dL (ref 3.9–4.9)
Alkaline Phosphatase: 145 IU/L — ABNORMAL HIGH (ref 44–121)
BUN/Creatinine Ratio: 11 (ref 10–24)
BUN: 10 mg/dL (ref 8–27)
Bilirubin Total: 0.4 mg/dL (ref 0.0–1.2)
CO2: 24 mmol/L (ref 20–29)
Calcium: 9.5 mg/dL (ref 8.6–10.2)
Chloride: 99 mmol/L (ref 96–106)
Creatinine, Ser: 0.93 mg/dL (ref 0.76–1.27)
Globulin, Total: 2.8 g/dL (ref 1.5–4.5)
Glucose: 90 mg/dL (ref 70–99)
Potassium: 4.7 mmol/L (ref 3.5–5.2)
Sodium: 136 mmol/L (ref 134–144)
Total Protein: 7.1 g/dL (ref 6.0–8.5)
eGFR: 93 mL/min/{1.73_m2} (ref >59)

## 2022-07-26 LAB — LIPID PANEL W/O CHOL/HDL RATIO
Cholesterol, Total: 147 mg/dL (ref 100–199)
HDL: 44 mg/dL (ref >39)
LDL Chol Calc (NIH): 79 mg/dL (ref 0–99)
Triglycerides: 133 mg/dL (ref 0–149)
VLDL Cholesterol Cal: 24 mg/dL (ref 5–40)

## 2022-07-26 LAB — HEMOGLOBIN A1C
Est. average glucose Bld gHb Est-mCnc: 114 mg/dL
Hgb A1c MFr Bld: 5.6 % (ref 4.8–5.6)

## 2022-07-26 NOTE — Progress Notes (Signed)
Good afternoon please let Mark [name] know his labs have returned and overall remain stable.  I want him to continue all current medications.  Any questions? Have a wonderful Christmas!! Keep being stellar!!  Thank you for allowing me to participate in your care.  I appreciate you. Kindest regards, Evanthia Maund

## 2022-08-20 IMAGING — CT CT ABD-PELV W/ CM
2 of 5 series · 15 of 46 positions shown, 17 images · IV contrast (APPLIED)
Comparison: 05/08/2019

CLINICAL DATA: Abdominal pain

EXAM:
CT ABDOMEN AND PELVIS WITH CONTRAST
TECHNIQUE: Multidetector CT imaging of the abdomen and pelvis was performed
using the standard protocol following bolus administration of
intravenous contrast.
CONTRAST:  100mL OMNIPAQUE IOHEXOL 300 MG/ML  SOLN

[Series 2: routine abd/pel with · axial · 0.77mm/px · z∈[-933,-483]mm · 12 of 102 slices shown, 14 images]
[im 6/102  soft-tissue]
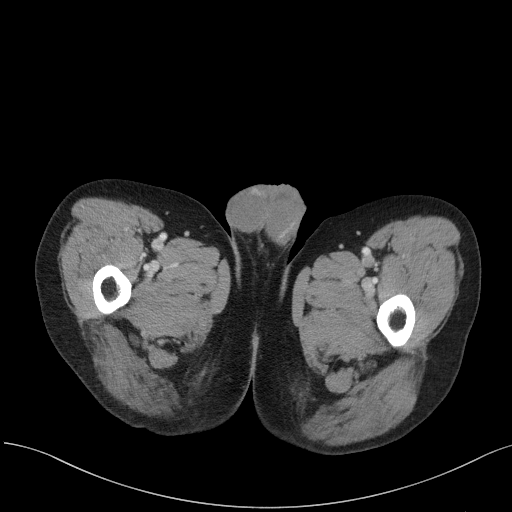
[im 6/102  bone]
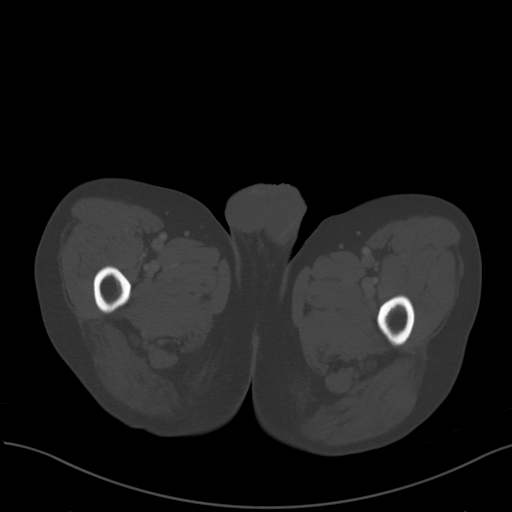
[im 16/102  soft-tissue]
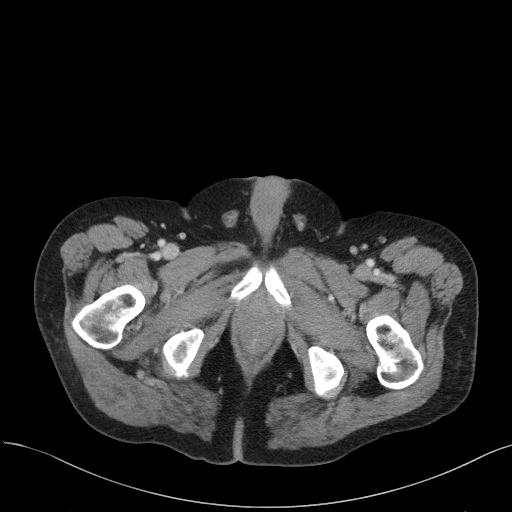
[im 21/102  soft-tissue]
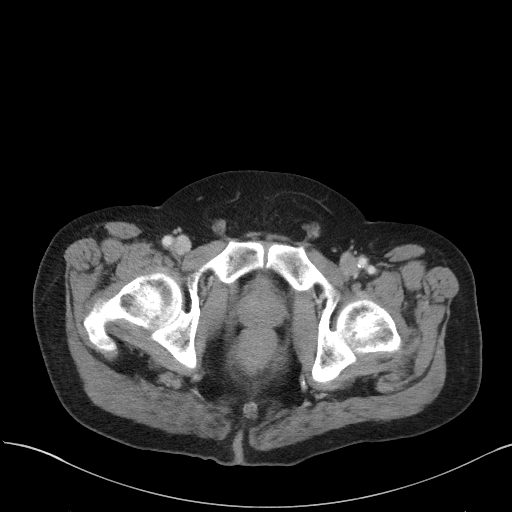
[im 31/102  soft-tissue]
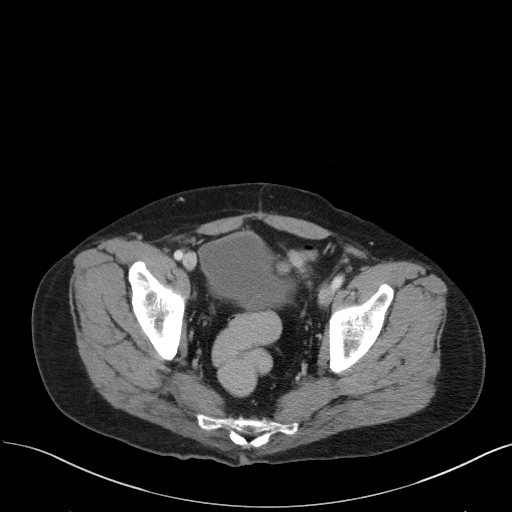
[im 41/102  soft-tissue]
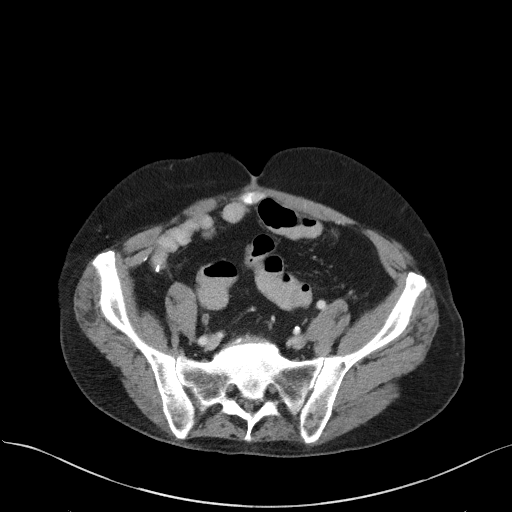
[im 46/102  soft-tissue]
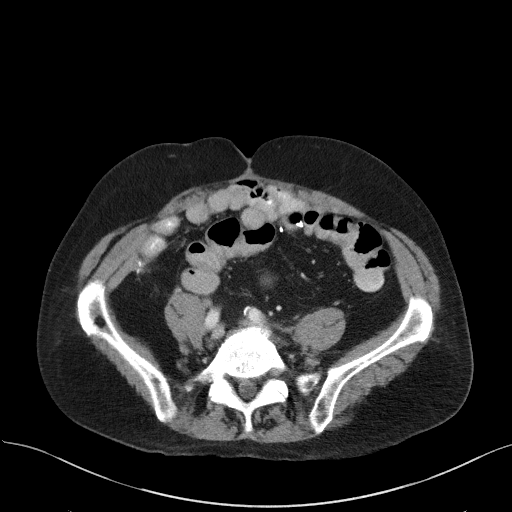
[im 56/102  soft-tissue]
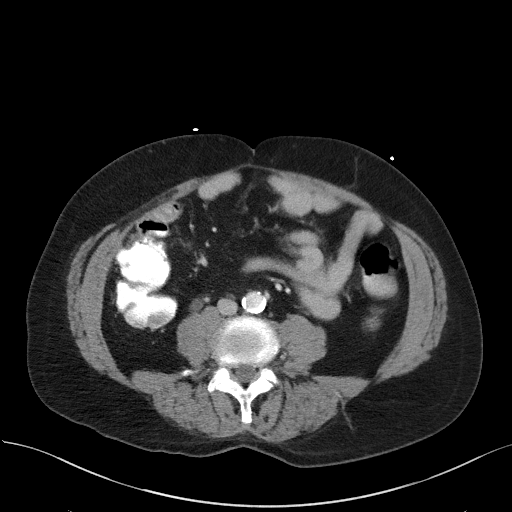
[im 61/102  soft-tissue]
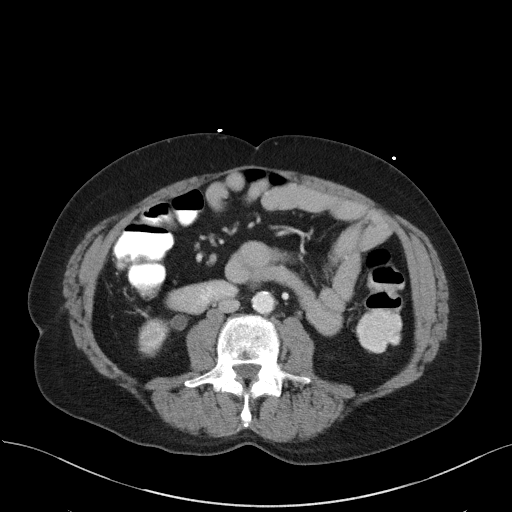
[im 71/102  soft-tissue]
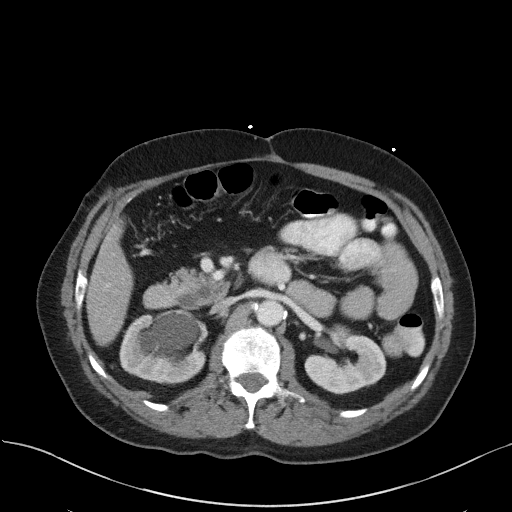
[im 71/102  bone]
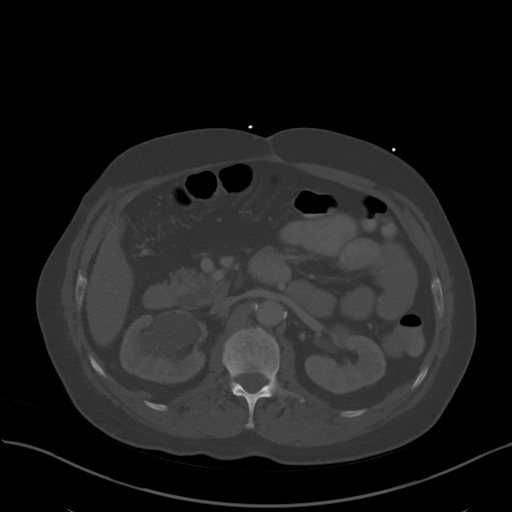
[im 81/102  soft-tissue]
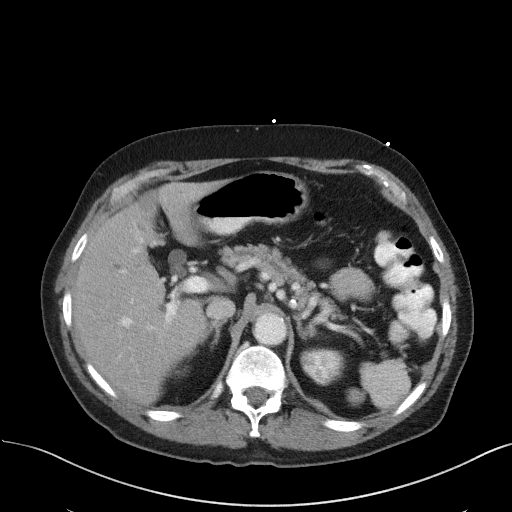
[im 86/102  soft-tissue]
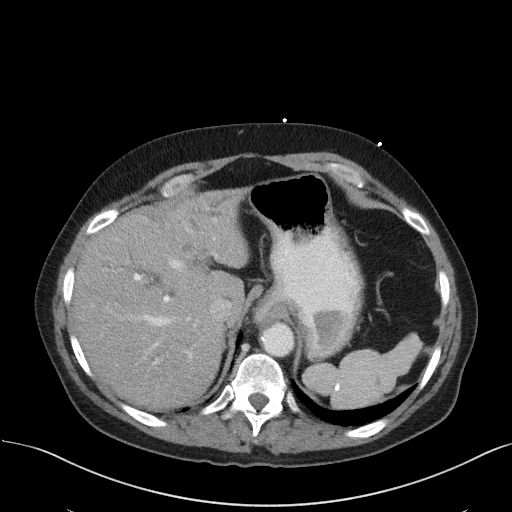
[im 96/102  soft-tissue]
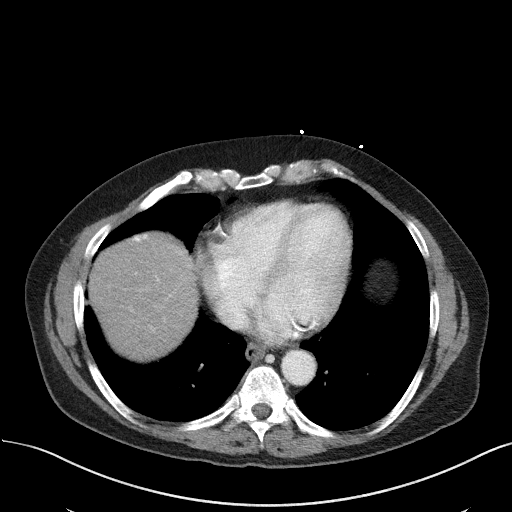

[Series 5: coronal st · coronal · 0.74mm/px · 3 of 92 slices shown]
[im 31/92  soft-tissue]
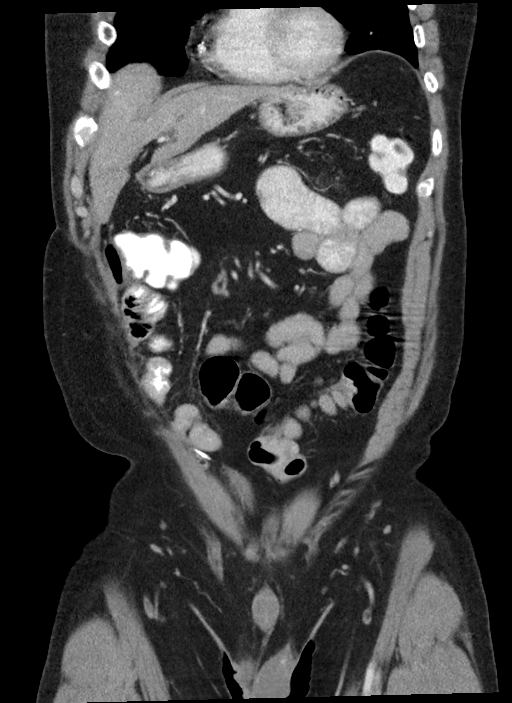
[im 41/92  soft-tissue]
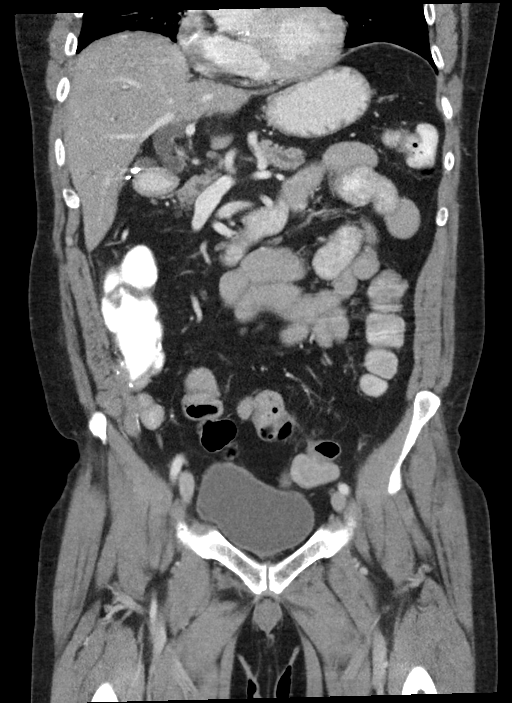
[im 51/92  soft-tissue]
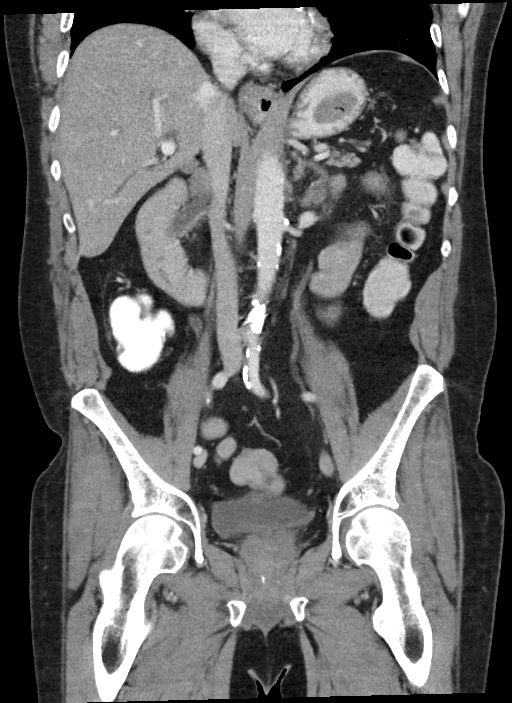

[15 of 46 positions shown; findings below may reference images not displayed]

FINDINGS: Lower chest: No acute abnormality.

Hepatobiliary: No focal liver abnormality. Cholecystectomy. Intra
and extrahepatic biliary dilatation without evidence of obstructing
stone at least partially due to cholecystectomy state.

Pancreas: Slightly increased dilatation of the main pancreatic duct
within the head. Otherwise unremarkable.

Spleen: Stable appearance.

Adrenals/Urinary Tract: Adrenals are unremarkable. 3 mm
nonobstructing calculus at the lower pole of the right kidney. Too
small to characterize low-attenuation lesions of the left kidney.
Persistent moderate right hydronephrosis. There is also right
hydroureter now extending to the level of the mid ureter where there
is an obstructing 7 mm calculus.

Stomach/Bowel: Stomach is within normal limits apart from small
hiatal hernia. Bowel is normal in caliber. Appendix is likely
surgically absent.

Vascular/Lymphatic: Aortic atherosclerosis. No enlarged lymph nodes
identified.

Reproductive: Stable appearance of the prostate.

Other: No ascites.  No abdominal wall hernia.

Musculoskeletal: Lower lumbar spine degenerative changes. No acute
osseous abnormality.
IMPRESSION: 7 mm obstructing calculus of the mid right ureter with proximal
hydroureteronephrosis.

Decreased right renal stone burden since 2828 with remaining 3 mm
nonobstructing stone at the lower pole.

Increased intra and extrahepatic biliary dilatation. Slightly
increased dilatation of the main pancreatic duct within the head. No
obstructing ampullary lesion identified on this study. MRI/MRCP
recommended as an outpatient for further evaluation.

## 2022-10-11 ENCOUNTER — Other Ambulatory Visit: Payer: Self-pay

## 2022-10-11 DIAGNOSIS — Z87891 Personal history of nicotine dependence: Secondary | ICD-10-CM

## 2022-10-11 DIAGNOSIS — F17210 Nicotine dependence, cigarettes, uncomplicated: Secondary | ICD-10-CM

## 2022-10-11 NOTE — Telephone Encounter (Signed)
Attempt to call to schedule annual LDCT.Marland Kitchen No answer and Vm did not pick up. Mailed letter to home asking to call for scheduling.

## 2022-10-17 NOTE — Telephone Encounter (Signed)
Returned call to patient who left message to schedule LDCT. No answer and unable to leave VM.

## 2022-11-11 ENCOUNTER — Other Ambulatory Visit: Payer: Self-pay | Admitting: Nurse Practitioner

## 2022-11-11 DIAGNOSIS — I10 Essential (primary) hypertension: Secondary | ICD-10-CM

## 2022-11-13 NOTE — Telephone Encounter (Signed)
Unable to refill per protocol, Rx request is too soon. Last refill 07/25/22 for 90 and 4 refills.  Requested Prescriptions  Pending Prescriptions Disp Refills   tamsulosin (FLOMAX) 0.4 MG CAPS capsule [Pharmacy Med Name: TAMSULOSIN HCL 0.4 MG CAPSULE] 90 capsule 4    Sig: TAKE 1 CAPSULE BY MOUTH EVERY DAY     Urology: Alpha-Adrenergic Blocker Failed - 11/11/2022 10:25 AM      Failed - PSA in normal range and within 360 days    PSA  Date Value Ref Range Status  11/03/2014 from [facility]  Final   Prostate Specific Ag, Serum  Date Value Ref Range Status  11/14/2021 0.5 0.0 - 4.0 ng/mL Final    Comment:    Roche ECLIA methodology. According to the American Urological Association, Serum PSA should decrease and remain at undetectable levels after radical prostatectomy. The AUA defines biochemical recurrence as an initial PSA value 0.2 ng/mL or greater followed by a subsequent confirmatory PSA value 0.2 ng/mL or greater. Values obtained with different assay methods or kits cannot be used interchangeably. Results cannot be interpreted as absolute evidence of the presence or absence of malignant disease.          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[facility])   [place], Henrine Screws T, NP   1 year ago [place], Santiago Glad, NP               amLODipine (NORVASC) 10 MG tablet [Pharmacy Med Name: AMLODIPINE BESYLATE 10 MG  TAB] 90 tablet 4    Sig: TAKE 1 TABLET BY MOUTH EVERY DAY     Cardiovascular: Calcium Channel Blockers 2 Passed - 11/11/2022 10:25 AM      Passed - Last BP in normal range    BP Readings from Last 1 Encounters:  07/25/22 104/72         Passed - Last Heart Rate in normal range    Pulse Readings from Last 1 Encounters:  07/25/22 64         Passed - Valid encounter within last 6 months    Recent Outpatient Visits           3 months ago Encounter for [employer] annual wellness exam   [place], Henrine Screws T, NP   6 months ago History of opioid abuse ([place])   [place], Henrine Screws T, NP   7 months ago Facial droop   [place], Melvin T, NP   12 months ago Paraseptal emphysema [facility])   [place], Henrine Screws T, NP   1 year ago [place] Jon Billings, NP

## 2022-11-16 ENCOUNTER — Ambulatory Visit: Payer: Self-pay | Admitting: *Deleted

## 2022-11-16 NOTE — Telephone Encounter (Signed)
  Chief Complaint: Medication questions Symptoms: none Frequency: now Pertinent Negatives: Patient denies  Disposition: [] ED /[] Urgent Care (no appt availability in office) / [] Appointment(In office/virtual)/ []  [place] [facility]/ [] Home Care/ [] Refused Recommended Disposition /[] [place] [facility]/ [x]  Follow-up with PCP Additional Notes: Pt states that since he first called he has gotten a list of his medications.  Pt states that his son helps him with his medications. PT and son, will review list together and see if any meds are missing. Per pt, son's wife is a Marine scientist, who will also help with medications. Pt will call back if needed.  Summary: med management   Pt has called for the second time, asking what medication he needs refilled. Pt seems confused about the medication he should be taking. He mentioned that his son takes care of his medications. Pt asked that we have a list of all medications that he should be taking ready for pickup. The office has this ready for him. I offered pt a callback from a nurse so he could discuss that with the nurse, but the patient declined and stated he just wanted a list. However, I feel he may need some guidance, as he doesn't know what medications he should be taking every day.  Seeking clinical advice.     Reason for Disposition  Caller has medicine question only, adult not sick, AND triager answers question  Answer Assessment - Initial Assessment Questions 1. NAME of MEDICINE: "What medicine(s) are you calling about?"     All of them 2. QUESTION: "What is your question?" (e.g., double dose of medicine, side effect)     What should pt be taking 3. PRESCRIBER: "Who prescribed the medicine?" Reason: if prescribed by specialist, call should be referred to that group.  Protocols used: Medication Question Call-A-AH

## 2022-11-16 NOTE — Telephone Encounter (Signed)
Summary: med management   Pt has called for the second time, asking what medication he needs refilled. Pt seems confused about the medication he should be taking. He mentioned that his son takes care of his medications. Pt asked that we have a list of all medications that he should be taking ready for pickup. The office has this ready for him. I offered pt a callback from a nurse so he could discuss that with the nurse, but the patient declined and stated he just wanted a list. However, I feel he may need some guidance, as he doesn't know what medications he should be taking every day.  Seeking clinical advice.      [facility]. Pharmacy staff reports last dispensed medications for 3 month supply was on 10/17/22 lopressor, flomax, amlodipine .   Called patient [phone] to review medication requests. No answer after multiple rings. No VM to leave message.

## 2022-11-20 ENCOUNTER — Ambulatory Visit: Payer: Medicare PPO

## 2022-12-12 ENCOUNTER — Telehealth: Payer: Self-pay | Admitting: Acute Care

## 2023-01-10 ENCOUNTER — Other Ambulatory Visit: Payer: Self-pay | Admitting: Nurse Practitioner

## 2023-01-11 NOTE — Telephone Encounter (Signed)
Unable to refill per protocol, Rx requests is too soon. Last refill 11/14/21 for 90 and 4 refills, next refill due in July.  Requested Prescriptions  Pending Prescriptions Disp Refills   [facility] D3 25 MCG (1000 UT) capsule [Pharmacy Med Name: [facility] VITAMIN D3 25 MCG SOFTGEL] 300 capsule 2    Sig: TAKE 2 CAPSULES (2,000 UNITS TOTAL) BY MOUTH DAILY.     Endocrinology:  Vitamins - Vitamin D Supplementation 2 Failed - 01/10/2023  2:23 PM      Failed - Manual Review: Route requests for 50,000 IU strength to the provider      Failed - Vitamin D in normal range and within 360 days    Vit D, 25-Hydroxy  Date Value Ref Range Status  11/14/2021 23.0 (L) 30.0 - 100.0 ng/mL Final    Comment:    Vitamin D deficiency has been defined by the Institute of Medicine and an Endocrine Society practice guideline as a level of serum 25-OH vitamin D less than 20 ng/mL (1,2). The Endocrine Society went on to further define vitamin D insufficiency as a level between 21 and 29 ng/mL (2). 1. IOM (Institute of Medicine). 2010. Dietary reference    intakes for calcium and D. [place]: The    [employer]. 2. Holick MF, Binkley [place], Bischoff-Ferrari HA, et al.    Evaluation, treatment, and prevention of vitamin D    deficiency: an Endocrine Society clinical practice    guideline. JCEM. 2011 Jul; 96(7):1911-30.          Passed - Ca in normal range and within 360 days    Calcium  Date Value Ref Range Status  07/25/2022 9.5 8.6 - 10.2 mg/dL Final   Calcium, Total  Date Value Ref Range Status  09/21/2011 9.1 8.5 - 10.1 mg/dL Final         Passed - Valid encounter within last 12 months    Recent Outpatient Visits           5 months ago Encounter for [employer] annual wellness exam   [place] [facility] [place], [place] T, NP   8 months ago History of opioid abuse (HCC)   [place] [facility] [place], [place] T, NP   9 months ago Facial droop   [place] [facility] [place], [place] T, NP   1 year ago Paraseptal emphysema [facility])   [place] [facility] [place], Corrie Dandy T, NP   1 year ago COVID-19   [place] [facility] [place], Clydie Braun, NP               ferrous sulfate 325 (65 FE) MG EC tablet [Pharmacy Med Name: FERROUS SULF EC 325 MG TABLET] 90 tablet 4    Sig: TAKE 1 TABLET BY MOUTH EVERY DAY WITH BREAKFAST     Endocrinology:  Minerals - Iron Supplementation Failed - 01/10/2023  2:23 PM      Failed - HGB in normal range and within 360 days    Hemoglobin  Date Value Ref Range Status  11/14/2021 14.6 13.0 - 17.7 g/dL Final         Failed - HCT in normal range and within 360 days    Hematocrit  Date Value Ref Range Status  11/14/2021 42.9 37.5 - 51.0 % Final         Failed - RBC in normal range and within 360 days    RBC  Date Value Ref Range Status  11/14/2021  4.54 4.14 - 5.80 x10E6/uL Final  04/04/2021 4.68 4.22 - 5.81 MIL/uL Final         Failed - Fe (serum) in normal range and within 360 days    Iron  Date Value Ref Range Status  09/15/2020 42 38 - 169 ug/dL Final   Iron Saturation  Date Value Ref Range Status  09/15/2020 14 (L) 15 - 55 % Final         Failed - Ferritin in normal range and within 360 days    Ferritin  Date Value Ref Range Status  09/15/2020 60 30 - 400 ng/mL Final         Passed - Valid encounter within last 12 months    Recent Outpatient Visits           5 months ago Encounter for [employer] annual wellness exam   [facility] [place], Corrie Dandy T, NP   8 months ago History of opioid abuse (HCC)   [place] [facility] [place], Corrie Dandy T, NP   9 months ago Facial droop   [place] [facility] [place], [place] T, NP   1 year ago Paraseptal emphysema [facility])   [place] [facility] [place], Corrie Dandy T, NP   1 year ago COVID-19   [place] [facility] [place], Clydie Braun, NP                omeprazole (PRILOSEC) 20 MG capsule [Pharmacy Med Name: OMEPRAZOLE DR 20 MG CAPSULE] 90 capsule 4    Sig: TAKE 1 CAPSULE BY MOUTH EVERY DAY     Gastroenterology: Proton Pump Inhibitors Passed - 01/10/2023  2:23 PM      Passed - Valid encounter within last 12 months    Recent Outpatient Visits           5 months ago Encounter for Medicare annual wellness exam   [place] [facility] [place], Corrie Dandy T, NP   8 months ago History of opioid abuse [facility])   [place] [facility] [place], Corrie Dandy T, NP   9 months ago Facial droop   [place] [facility] [place], [place] T, NP   1 year ago Paraseptal emphysema [facility])   [place] [facility] [place], Corrie Dandy T, NP   1 year ago COVID-19   [facility] Larae Grooms, NP

## 2023-01-12 ENCOUNTER — Other Ambulatory Visit: Payer: Self-pay | Admitting: Nurse Practitioner

## 2023-01-12 NOTE — Telephone Encounter (Signed)
Requested medications are due for refill today.  no  Requested medications are on the active medications list.  yes  Last refill. 11/15/2021 #180 4 rf  Future visit scheduled.   no  Notes to clinic.  Expired labs.    Requested Prescriptions  Pending Prescriptions Disp Refills   [facility] D3 25 MCG (1000 UT) capsule [Pharmacy Med Name: [facility] VITAMIN D3 25 MCG SOFTGEL] 300 capsule 2    Sig: TAKE 2 CAPSULES (2,000 UNITS TOTAL) BY MOUTH DAILY.     Endocrinology:  Vitamins - Vitamin D Supplementation 2 Failed - 01/12/2023  4:22 PM      Failed - Manual Review: Route requests for 50,000 IU strength to the provider      Failed - Vitamin D in normal range and within 360 days    Vit D, 25-Hydroxy  Date Value Ref Range Status  11/14/2021 23.0 (L) 30.0 - 100.0 ng/mL Final    Comment:    Vitamin D deficiency has been defined by the Institute of Medicine and an Endocrine Society practice guideline as a level of serum 25-OH vitamin D less than 20 ng/mL (1,2). The Endocrine Society went on to further define vitamin D insufficiency as a level between 21 and 29 ng/mL (2). 1. IOM (Institute of Medicine). 2010. Dietary reference    intakes for calcium and D. [place]: The    [employer]. 2. Holick MF, Binkley [place], Bischoff-Ferrari HA, et al.    Evaluation, treatment, and prevention of vitamin D    deficiency: an Endocrine Society clinical practice    guideline. JCEM. 2011 Jul; 96(7):1911-30.          Passed - Ca in normal range and within 360 days    Calcium  Date Value Ref Range Status  07/25/2022 9.5 8.6 - 10.2 mg/dL Final   Calcium, Total  Date Value Ref Range Status  09/21/2011 9.1 8.5 - 10.1 mg/dL Final         Passed - Valid encounter within last 12 months    Recent Outpatient Visits           5 months ago Encounter for [employer] annual wellness exam   [place] [facility] [place], Corrie Dandy T, NP   8 months ago History of opioid abuse [facility])   [place]  [facility] [place], Corrie Dandy T, NP   9 months ago Facial droop   [place] [facility] [place], Corrie Dandy T, NP   1 year ago Paraseptal emphysema [facility])   [place] [facility] Aura Dials T, NP   1 year ago COVID-19   [facility] Larae Grooms, NP

## 2023-01-18 ENCOUNTER — Emergency Department
Admission: EM | Admit: 2023-01-18 | Discharge: 2023-01-18 | Disposition: A | Discharge status: Home / Self Care | Payer: Medicare PPO | Attending: Emergency Medicine | Admitting: Emergency Medicine

## 2023-01-18 ENCOUNTER — Encounter: Payer: Self-pay | Admitting: Emergency Medicine

## 2023-01-18 ENCOUNTER — Emergency Department: Payer: Medicare PPO

## 2023-01-18 ENCOUNTER — Other Ambulatory Visit: Payer: Self-pay

## 2023-01-18 ENCOUNTER — Ambulatory Visit: Payer: Self-pay

## 2023-01-18 DIAGNOSIS — Z23 Encounter for immunization: Secondary | ICD-10-CM | POA: Diagnosis not present

## 2023-01-18 DIAGNOSIS — W01198A Fall on same level from slipping, tripping and stumbling with subsequent striking against other object, initial encounter: Secondary | ICD-10-CM | POA: Diagnosis not present

## 2023-01-18 DIAGNOSIS — S51811A Laceration without foreign body of right forearm, initial encounter: Secondary | ICD-10-CM | POA: Insufficient documentation

## 2023-01-18 DIAGNOSIS — I10 Essential (primary) hypertension: Secondary | ICD-10-CM | POA: Diagnosis not present

## 2023-01-18 DIAGNOSIS — S40819A Abrasion of unspecified upper arm, initial encounter: Secondary | ICD-10-CM | POA: Diagnosis not present

## 2023-01-18 DIAGNOSIS — S0191XA Laceration without foreign body of unspecified part of head, initial encounter: Secondary | ICD-10-CM

## 2023-01-18 DIAGNOSIS — S0990XA Unspecified injury of head, initial encounter: Secondary | ICD-10-CM | POA: Diagnosis present

## 2023-01-18 DIAGNOSIS — S51812A Laceration without foreign body of left forearm, initial encounter: Secondary | ICD-10-CM | POA: Diagnosis not present

## 2023-01-18 DIAGNOSIS — S0181XA Laceration without foreign body of other part of head, initial encounter: Secondary | ICD-10-CM | POA: Insufficient documentation

## 2023-01-18 DIAGNOSIS — I63532 Cerebral infarction due to unspecified occlusion or stenosis of left posterior cerebral artery: Secondary | ICD-10-CM | POA: Diagnosis not present

## 2023-01-18 DIAGNOSIS — S069X9A Unspecified intracranial injury with loss of consciousness of unspecified duration, initial encounter: Secondary | ICD-10-CM | POA: Diagnosis not present

## 2023-01-18 MED ORDER — LIDOCAINE HCL (PF) 1 % IJ SOLN
5.0000 mL | Freq: Once | INTRAMUSCULAR | Status: AC
  Administered 2023-01-18: 5 mL via INTRADERMAL
  Filled 2023-01-18: qty 5

## 2023-01-18 MED ORDER — TETANUS-DIPHTH-ACELL PERTUSSIS 5-2.5-18.5 LF-MCG/0.5 IM SUSY
0.5000 mL | PREFILLED_SYRINGE | Freq: Once | INTRAMUSCULAR | Status: AC
  Administered 2023-01-18: 0.5 mL via INTRAMUSCULAR
  Filled 2023-01-18: qty 0.5

## 2023-01-18 NOTE — ED Provider Notes (Signed)
[facility] Provider Note    Event Date/Time   First MD Initiated Contact with Patient 01/18/23 1623     (approximate)   History   Head Injury   HPI  Mark [name] is a 62 y.o. male with PMH of HTN, CVA and other chronic conditions who presents for evaluation of a head injury.  Patient states that he tripped on his shoes last night falling and hitting his head as well as scraping his arms.  He states LOC but was unsure for how long.  He was seen by next care UC today and was advised to come to the ER for a head CT.  Patient does not take blood thinners.      Physical Exam   Triage Vital Signs: ED Triage Vitals  Enc Vitals Group     BP 01/18/23 1532 107/75     Pulse Rate 01/18/23 1532 86     Resp 01/18/23 1532 18     Temp 01/18/23 1532 98.4 F (36.9 C)     Temp Source 01/18/23 1532 Oral     SpO2 01/18/23 1532 96 %     Weight 01/18/23 1534 150 lb (68 kg)     Height 01/18/23 1534 5\' 6"  (1.676 m)     Head Circumference --      Peak Flow --      Pain Score 01/18/23 1534 5     Pain Loc --      Pain Edu? --      Excl. in GC? --     Most recent vital signs: Vitals:   01/18/23 1532 01/18/23 1844  BP: 107/75 110/70  Pulse: 86 80  Resp: 18 18  Temp: 98.4 F (36.9 C)   SpO2: 96% 97%    General: Awake, no distress.  CV:  Good peripheral perfusion.  Resp:  Normal effort.  Abd:  No distention.  Neuro:  No focal deficits on neuroexam but was difficult to determine if patient was at their baseline given prior history of strokes.  4/5 strength in the left lower extremity. Other:  Skin tears to bilateral forearms, laceration to the left forehead, bleeding controlled.    ED Results / Procedures / Treatments   Labs (all labs ordered are listed, but only abnormal results are displayed) Labs Reviewed - No data to display   RADIOLOGY  CT head without contrast obtained in the ED today.  I interpreted the images as well as reviewed the  radiologist report.  IMPRESSION:  1. Generalized cerebral atrophy.  2. 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  3. Small, chronic bilateral cerebellar infarcts.  4. No acute intracranial abnormality.    PROCEDURES:  Critical Care performed: No  ..Laceration Repair  Date/Time: 01/18/2023 6:34 PM  Performed by: Cameron Ali, PA-C Authorized by: Cameron Ali, PA-C   Consent:    Consent obtained:  Verbal   Consent given by:  Patient   Risks discussed:  Pain and poor cosmetic result Anesthesia:    Anesthesia method:  Local infiltration   Local anesthetic:  Lidocaine 1% w/o epi Laceration details:    Location:  Face   Face location:  Forehead   Length (cm):  2 Pre-procedure details:    Preparation:  Patient was prepped and draped in usual sterile fashion Treatment:    Area cleansed with:  Povidone-iodine   Irrigation solution:  Sterile water   Irrigation volume:  3 mL   Irrigation  method:  Syringe Skin repair:    Repair method:  Sutures   Suture size:  4-0   Suture material:  Prolene   Suture technique:  Horizontal mattress   Number of sutures:  3 Approximation:    Approximation:  Close Repair type:    Repair type:  Simple Post-procedure details:    Dressing:  Open (no dressing)   Procedure completion:  Tolerated    MEDICATIONS ORDERED IN ED: Medications  lidocaine (PF) (XYLOCAINE) 1 % injection 5 mL (5 mLs Intradermal Given 01/18/23 1751)  Tdap (BOOSTRIX) injection 0.5 mL (0.5 mLs Intramuscular Given 01/18/23 1836)     IMPRESSION / MDM / ASSESSMENT AND PLAN / ED COURSE  I reviewed the triage vital signs and the nursing notes.                              Differential diagnosis includes, but is not limited to, contusion, concussion, laceration, intracranial hemorrhage.  Patient's presentation is most consistent with acute complicated illness / injury requiring diagnostic workup.  Patient with history of CVA  and HTN presented to the ED after a fall last night where he hit his head and arms.  Patient endorsed LOC and went to UC for evaluation.  He was sent to the ED for CT scan by UC.  Noncon CT scan obtained in the ED.  I interpreted the images as well as reviewed the radiologist report, which did not show any acute intracranial abnormality but did show evidence of previous infarcts.  I completed a neuro exam and did not note any focal deficits aside from 4/5 strength on the left side which is from a previous stroke.  Patient had a 2 cm laceration to the left forehead which was repaired with 3 horizontal mattress sutures.  Wound had good approximation and patient tolerated procedure, further details can be seen in the procedure note above.  Abrasions on the left and right forearms were dressed with nonadherent gauze and I then wrapped his forearms with regular gauze and put patient's Ace bandage over top of that.  I advised patient to follow-up with his PCP in 5 to 7 days for suture removal.  Patient was given an updated tetanus shot.  I advised patient to return to the ED if there is any worsening in his symptoms.  Patient voiced understanding, all questions were answered, patient was stable at discharge.    FINAL CLINICAL IMPRESSION(S) / ED DIAGNOSES   Final diagnoses:  Laceration of head without foreign body, unspecified part of head, initial encounter     Rx / DC Orders   ED Discharge Orders     None        Note:  This document was prepared using Dragon voice recognition software and may include unintentional dictation errors.   Cameron Ali, PA-C 01/18/23 1915    Minna Antis, MD 01/19/23 2306

## 2023-01-18 NOTE — Telephone Encounter (Addendum)
Pt called back. He seems quite agitated. He states he has stiches in his forehead, but cannot tell me who placed the stitches. Pt states he does not have a license and so cannot drive to an UC. Pt states that his arms are hurting a lot. PT may call EMS for transport to hospital.    Chief Complaint: Fall in bathroom from dizziness - Hurt both arms Symptoms: Pain in both arms Frequency: last night Pertinent Negatives: Patient denies  Disposition: [] ED /[] Urgent Care (no appt availability in office) / [] Appointment(In office/virtual)/ []  [facility] [facility]/ [] Home Care/ [] Refused Recommended Disposition /[] [facility]/ []  Follow-up with PCP Additional Notes: PT fell last night in the bathroom. He states he was dizzy and fell. He hurt both arms. He states that pain is 5-6/10. Pt states that he messed up his arms pretty bad. He does not think they are broken. Pt sounds very upset and wants to be seen today.  Please advise.  Sent teams message to [facility] as well   Answer Assessment - Initial Assessment Questions 1. MECHANISM: "How did the fall happen?"     Fell on the floor in the bathroom - last night 3. ONSET: "When did the fall happen?" (e.g., minutes, hours, or days ago)     Hurt both arms - was dizzy and fell 4. LOCATION: "What part of the body hit the ground?" (e.g., back, buttocks, head, hips, knees, hands, head, stomach)     Both arms 5. INJURY: "Did you hurt (injure) yourself when you fell?" If Yes, ask: "What did you injure? Tell me more about this?" (e.g., body area; type of injury; pain severity)"     yes 6. PAIN: "Is there any pain?" If Yes, ask: "How bad is the pain?" (e.g., Scale 1-10; or mild,  moderate, severe)   - NONE (0): No pain   - MILD (1-3): Doesn't interfere with normal activities    - MODERATE (4-7): Interferes with normal activities or awakens from sleep    - SEVERE (8-10): Excruciating pain, unable to do any normal activities      5-6/10 7. SIZE: For  cuts, bruises, or swelling, ask: "How large is it?" (e.g., inches or centimeters)      None yet 9. OTHER SYMPTOMS: "Do you have any other symptoms?" (e.g., dizziness, fever, weakness; new onset or worsening).      Now ok  10. CAUSE: "What do you think caused the fall (or falling)?" (e.g., tripped, dizzy spell)       dizzy  Protocols used: Falls and [facility]

## 2023-01-18 NOTE — ED Notes (Signed)
See triage note  States he tripped over his shoes   fell hitting his head on bed  questionable LOC  Sent over from UC   Has laceration to forehead and abrasions to forearm

## 2023-01-18 NOTE — ED Triage Notes (Signed)
Pt tripped and fell last night hit head and and abrasions to both forearms. Pt states he is not on blood thinners. Pt states LOC for a brief second after fall. Went to [facility] urgent care today and was advised to come to ER for head CT. Pt has laceration to left forehead.

## 2023-01-19 NOTE — Telephone Encounter (Signed)
Will leave for PCP to review

## 2023-01-19 NOTE — Telephone Encounter (Signed)
Called patient back and he states that he went to ED yesterday and that he is feeling better today and declined making an appt at this time for a follow up

## 2023-01-22 NOTE — Telephone Encounter (Signed)
Attempted to reach patient to get him scheduled, however no answer.  Will try again later.

## 2023-01-23 ENCOUNTER — Telehealth: Payer: Self-pay

## 2023-01-23 NOTE — Telephone Encounter (Signed)
Transition Care Management Unsuccessful Follow-up Telephone Call  Date of discharge and from where:  [place] 6/6  Attempts:  1st Attempt  Reason for unsuccessful TCM follow-up call:  Unable to leave message   Lenard Forth [facility] Guide, [facility] [phone] [address] [place], [place], [place] [address] Phone: [phone] Email: [email or web address]

## 2023-01-23 NOTE — Telephone Encounter (Signed)
Called and scheduled appointment on 01/24/2023 @ 2:00 pm.

## 2023-01-24 ENCOUNTER — Telehealth: Payer: Self-pay

## 2023-01-24 ENCOUNTER — Encounter: Payer: Self-pay | Admitting: Nurse Practitioner

## 2023-01-24 ENCOUNTER — Ambulatory Visit (INDEPENDENT_AMBULATORY_CARE_PROVIDER_SITE_OTHER): Payer: Medicare PPO | Admitting: Nurse Practitioner

## 2023-01-24 VITALS — BP 126/82 | HR 56 | Temp 98.2°F | Ht 69.02 in | Wt 148.8 lb

## 2023-01-24 DIAGNOSIS — Z8673 Personal history of transient ischemic attack (TIA), and cerebral infarction without residual deficits: Secondary | ICD-10-CM | POA: Diagnosis not present

## 2023-01-24 DIAGNOSIS — J438 Other emphysema: Secondary | ICD-10-CM

## 2023-01-24 DIAGNOSIS — R7309 Other abnormal glucose: Secondary | ICD-10-CM

## 2023-01-24 DIAGNOSIS — I700 Atherosclerosis of aorta: Secondary | ICD-10-CM | POA: Diagnosis not present

## 2023-01-24 DIAGNOSIS — I10 Essential (primary) hypertension: Secondary | ICD-10-CM | POA: Diagnosis not present

## 2023-01-24 DIAGNOSIS — G319 Degenerative disease of nervous system, unspecified: Secondary | ICD-10-CM | POA: Diagnosis not present

## 2023-01-24 DIAGNOSIS — N400 Enlarged prostate without lower urinary tract symptoms: Secondary | ICD-10-CM | POA: Diagnosis not present

## 2023-01-24 DIAGNOSIS — E785 Hyperlipidemia, unspecified: Secondary | ICD-10-CM

## 2023-01-24 DIAGNOSIS — S0001XA Abrasion of scalp, initial encounter: Secondary | ICD-10-CM | POA: Diagnosis not present

## 2023-01-24 DIAGNOSIS — F17219 Nicotine dependence, cigarettes, with unspecified nicotine-induced disorders: Secondary | ICD-10-CM

## 2023-01-24 NOTE — Assessment & Plan Note (Signed)
Chronic, stable.  PSA on labs today.

## 2023-01-24 NOTE — Assessment & Plan Note (Signed)
Noted on recent CT imaging in ER 01/18/23 with history of CVA x 2 in 2014 and history of heavier alcohol use.  Educated him on this and discussed we would monitor memory closely.  May need neurology referral in future if any significant memory changes.

## 2023-01-24 NOTE — Progress Notes (Signed)
BP 126/82   Pulse (!) 56   Temp 98.2 F (36.8 C) (Oral)   Ht 5' 9.02" (1.753 m)   Wt 148 lb 12.8 oz (67.5 kg)   SpO2 96%   BMI 21.96 kg/m    Subjective:    Patient ID: Mark [name], male    DOB: 05-20-1961, 62 y.o.   MRN: [id number]  HPI: Mark [name] is a 62 y.o. male  Chief Complaint  Patient presents with   Genia Hotter on 01/18/23, went to ED, received laceration to left forehead, here for follow up   ER FOLLOW UP Was seen in ER on 01/18/23 due to fall and laceration on left side of anterior scalp.  He reports passing out for a short period, a second or two, and then woke-up and went back to his bed.  CT scan performed noting general cerebral atrophy + chronic infarct areas.   He reports falling on left side and had injury to left arm and scalp.  Has abrasions to left arm.  No further falls since recent incident.  Living on own at this time. Time since discharge:  Hospital/facility: [facility] Diagnosis: Fall with injury Procedures/tests: CT scan Consultants: none New medications: none Discharge instructions:  Follow-up with PCP for suture removal Status: stable   HYPERTENSION / HYPERLIPIDEMIA Continues on Amlodipine and Metoprolol daily. Has history of elevation in LFT due to alcohol intake, his recent labs have been stable and no alcohol use he reports in >1 year.  Last A1c 5.6% on check, history of elevations.  History of CVA x 2 in 2014. Satisfied with current treatment? yes Duration of hypertension: chronic BP monitoring frequency: no BP range:  BP medication side effects: no Duration of hyperlipidemia: chronic Cholesterol medication side effects: no Cholesterol supplements: none Medication compliance: good compliance Aspirin: yes Recent stressors: no Recurrent headaches: no Visual changes: no Palpitations: no Dyspnea: no Chest pain: no Lower extremity edema: no Dizzy/lightheaded: no  The ASCVD Risk score (Arnett DK, et al., 2019) failed to calculate  for the following reasons:   The patient has a prior MI or stroke diagnosis  COPD Had lung screening September 2021 -- noted emphysema and aortic atherosclerosis + a 2.8 cm left lower lobe nodule -- to continue yearly screening, has missed >2 years. Smokes about 1 PPD, has been smoking since age 56.  No current inhalers or symptoms.   COPD status: stable Satisfied with current treatment?: yes Oxygen use: no Dyspnea frequency: none Cough frequency: none Rescue inhaler frequency:  none Limitation of activity: no Productive cough: none Last Spirometry: none, refuses Pneumovax: Not up to Date, refuses Influenza: refuses  Relevant past medical, surgical, family and social history reviewed and updated as indicated. Interim medical history since our last visit reviewed. Allergies and medications reviewed and updated.  Review of Systems  Constitutional:  Negative for activity change, diaphoresis, fatigue and fever.  Respiratory:  Negative for cough, chest tightness, shortness of breath and wheezing.   Cardiovascular:  Negative for chest pain, palpitations and leg swelling.  Skin:  Positive for wound.  Neurological: Negative.   Psychiatric/Behavioral: Negative.      Per HPI unless specifically indicated above     Objective:    BP 126/82   Pulse (!) 56   Temp 98.2 F (36.8 C) (Oral)   Ht 5' 9.02" (1.753 m)   Wt 148 lb 12.8 oz (67.5 kg)   SpO2 96%   BMI 21.96 kg/m   Wt Readings  from Last 3 Encounters:  01/24/23 148 lb 12.8 oz (67.5 kg)  01/18/23 150 lb (68 kg)  07/25/22 151 lb 14.4 oz (68.9 kg)    Physical Exam Vitals and nursing note reviewed.  Constitutional:      General: He is awake. He is not in acute distress.    Appearance: He is well-developed and well-groomed. He is not ill-appearing.  HENT:     Head: Normocephalic and atraumatic.     Right Ear: Hearing normal. No drainage.     Left Ear: Hearing normal. No drainage.  Eyes:     General: Lids are normal.         Right eye: No discharge.        Left eye: No discharge.     Conjunctiva/sclera: Conjunctivae normal.     Pupils: Pupils are equal, round, and reactive to light.  Neck:     Vascular: No carotid bruit.     Trachea: Trachea normal.  Cardiovascular:     Rate and Rhythm: Regular rhythm. Bradycardia present.     Heart sounds: Normal heart sounds, S1 normal and S2 normal. No murmur heard.    No gallop.  Pulmonary:     Effort: Pulmonary effort is normal. No accessory muscle usage or respiratory distress.     Breath sounds: Normal breath sounds.  Abdominal:     General: Bowel sounds are normal.     Palpations: Abdomen is soft. There is no hepatomegaly or splenomegaly.  Musculoskeletal:        General: Normal range of motion.     Cervical back: Normal range of motion and neck supple.     Right lower leg: No edema.     Left lower leg: No edema.  Skin:    General: Skin is warm and dry.     Capillary Refill: Capillary refill takes less than 2 seconds.     Findings: Wound present. No rash.          Comments: Skin tear left arm approx 5 - 6 cm with no redness, warmth, or edema.  Overall healing.  Triple abx applied and non stick dressing applied with wrap.  Neurological:     Mental Status: He is alert and oriented to person, place, and time.     Deep Tendon Reflexes: Reflexes are normal and symmetric.  Psychiatric:        Attention and Perception: Attention normal.        Mood and Affect: Mood normal.        Speech: Speech normal.        Behavior: Behavior normal. Behavior is cooperative.        Thought Content: Thought content normal.     Comments: Good spirits today.    Results for orders placed or performed in visit on 07/25/22  Comprehensive metabolic panel  Result Value Ref Range   Glucose 90 70 - 99 mg/dL   BUN 10 8 - 27 mg/dL   Creatinine, Ser 1.30 0.76 - 1.27 mg/dL   eGFR 93 >86 VH/QIO/9.62   BUN/Creatinine Ratio 11 10 - 24   Sodium 136 134 - 144 mmol/L   Potassium 4.7 3.5  - 5.2 mmol/L   Chloride 99 96 - 106 mmol/L   CO2 24 20 - 29 mmol/L   Calcium 9.5 8.6 - 10.2 mg/dL   Total Protein 7.1 6.0 - 8.5 g/dL   Albumin 4.3 3.9 - 4.9 g/dL   Globulin, Total 2.8 1.5 - 4.5 g/dL   Albumin/Globulin  Ratio 1.5 1.2 - 2.2   Bilirubin Total 0.4 0.0 - 1.2 mg/dL   Alkaline Phosphatase 145 (H) 44 - 121 IU/L   AST 28 0 - 40 IU/L   ALT 38 0 - 44 IU/L  Lipid Panel w/o Chol/HDL Ratio  Result Value Ref Range   Cholesterol, Total 147 100 - 199 mg/dL   Triglycerides 161 0 - 149 mg/dL   HDL 44 >09 mg/dL   VLDL Cholesterol Cal 24 5 - 40 mg/dL   LDL Chol Calc (NIH) 79 0 - 99 mg/dL  HgB U0A  Result Value Ref Range   Hgb A1c MFr Bld 5.6 4.8 - 5.6 %   Est. average glucose Bld gHb Est-mCnc 114 mg/dL      Assessment & Plan:   Problem List Items Addressed This Visit       Cardiovascular and Mediastinum   Aortic atherosclerosis (HCC)    Chronic.  Noted on past lung screening.  Educated patient on this.  Recommend complete cessation of smoking + continue daily statin & ASA for prevention.      Hypertension    Chronic, stable. BP at goal in office today.  Continue current medication regimen and adjust as needed.  Recommend he monitor BP a few days a week at home and document for provider + focus on DASH diet.  LABS: CMP, CBC, TSH.  Return to office in 6 months.        Relevant Orders   CBC with Differential/Platelet   TSH     Respiratory   Paraseptal emphysema (HCC)    Chronic, no current inhalers or symptoms.  Long time smoker.  Have recommended complete cessation.  Will plan on spirometry at upcoming visits, refuses today.  Continue yearly lung CT CA screening -- new referral placed and recommend he attend.  Initiate inhaler regimen as needed.  CBC today.      Relevant Orders   Ambulatory Referral Lung Cancer Screening [facility]     Nervous and Auditory   Cerebral atrophy (HCC) - Primary    Noted on recent CT imaging in ER 01/18/23 with history of CVA x 2 in 2014  and history of heavier alcohol use.  Educated him on this and discussed we would monitor memory closely.  May need neurology referral in future if any significant memory changes.      Nicotine dependence, cigarettes, w unsp disorders    I have recommended complete cessation of tobacco use. I have discussed various options available for assistance with tobacco cessation including over the counter methods (Nicotine gum, patch and lozenges). We also discussed prescription options (Chantix, Nicotine Inhaler / Nasal Spray). The patient is not interested in pursuing any prescription tobacco cessation options at this time.  Recommend he continue yearly lung screening.       Relevant Orders   Ambulatory Referral Lung Cancer Screening  Pulmonary     Musculoskeletal and Integument   Abrasion, scalp w/o infection    X 3 suture removed and abrasion well approximated with no s/s infection.  Monitor closely.  Steri strips applied and instructed patient on these.          Genitourinary   Benign prostatic hyperplasia without lower urinary tract symptoms    Chronic, stable.  PSA on labs today.      Relevant Orders   PSA     Other   Elevated hemoglobin A1c    A1c remains stable at 5.6% recent labs, recheck next visit.  Continue diet  focus.        History of CVA (cerebrovascular accident)    H/O x 2 CVA.  Continue ASA and to monitor lipid panel. Goal A1c <6.5%, BP <130/80, and LDL <70 for prevention.        Hyperlipidemia LDL goal <70    Chronic, ongoing.  Continue current medication regimen and adjust as needed.  Lipid panel today.      Relevant Orders   Comprehensive metabolic panel   Lipid Panel w/o Chol/HDL Ratio     Follow up plan: Return in about 6 months (around 07/26/2023) for HTN/HLD, COPD, MEMORY CHECK.

## 2023-01-24 NOTE — Patient Instructions (Signed)

## 2023-01-24 NOTE — Assessment & Plan Note (Signed)
X 3 suture removed and abrasion well approximated with no s/s infection.  Monitor closely.  Steri strips applied and instructed patient on these.

## 2023-01-24 NOTE — Assessment & Plan Note (Signed)
A1c remains stable at 5.6% recent labs, recheck next visit.  Continue diet focus.

## 2023-01-24 NOTE — Telephone Encounter (Signed)
Transition Care Management Unsuccessful Follow-up Telephone Call  Date of discharge and from where:  [place] 6/6  Attempts:  2nd Attempt  Reason for unsuccessful TCM follow-up call:  No answer/busy   Lenard Forth [facility] Guide, [facility] [phone] [address] [place], [place], [place] [address] Phone: [phone] Email: [email or web address]

## 2023-01-24 NOTE — Assessment & Plan Note (Signed)
Chronic, ongoing.  Continue current medication regimen and adjust as needed. Lipid panel today. 

## 2023-01-24 NOTE — Assessment & Plan Note (Signed)
I have recommended complete cessation of tobacco use. I have discussed various options available for assistance with tobacco cessation including over the counter methods (Nicotine gum, patch and lozenges). We also discussed prescription options (Chantix, Nicotine Inhaler / Nasal Spray). The patient is not interested in pursuing any prescription tobacco cessation options at this time.  Recommend he continue yearly lung screening.  

## 2023-01-24 NOTE — Assessment & Plan Note (Signed)
Chronic.  Noted on past lung screening.  Educated patient on this.  Recommend complete cessation of smoking + continue daily statin & ASA for prevention. 

## 2023-01-24 NOTE — Assessment & Plan Note (Signed)
Chronic, stable. BP at goal in office today.  Continue current medication regimen and adjust as needed.  Recommend he monitor BP a few days a week at home and document for provider + focus on DASH diet.  LABS: CMP, CBC, TSH.  Return to office in 6 months.

## 2023-01-24 NOTE — Assessment & Plan Note (Signed)
Chronic, no current inhalers or symptoms.  Long time smoker.  Have recommended complete cessation.  Will plan on spirometry at upcoming visits, refuses today.  Continue yearly lung CT CA screening -- new referral placed and recommend he attend.  Initiate inhaler regimen as needed.  CBC today.

## 2023-01-24 NOTE — Assessment & Plan Note (Addendum)
H/O x 2 CVA.  Continue ASA and to monitor lipid panel. Goal A1c <6.5%, BP <130/80, and LDL <70 for prevention.

## 2023-01-25 LAB — CBC WITH DIFFERENTIAL/PLATELET
Basophils Absolute: 0.1 10*3/uL (ref 0.0–0.2)
Basos: 1 %
EOS (ABSOLUTE): 0.2 10*3/uL (ref 0.0–0.4)
Eos: 3 %
Hematocrit: 42.6 % (ref 37.5–51.0)
Hemoglobin: 14.1 g/dL (ref 13.0–17.7)
Immature Grans (Abs): 0 10*3/uL (ref 0.0–0.1)
Immature Granulocytes: 0 %
Lymphocytes Absolute: 2.8 10*3/uL (ref 0.7–3.1)
Lymphs: 39 %
MCH: 31.5 pg (ref 26.6–33.0)
MCHC: 33.1 g/dL (ref 31.5–35.7)
MCV: 95 fL (ref 79–97)
Monocytes Absolute: 0.5 10*3/uL (ref 0.1–0.9)
Monocytes: 7 %
Neutrophils Absolute: 3.5 10*3/uL (ref 1.4–7.0)
Neutrophils: 50 %
Platelets: 346 10*3/uL (ref 150–450)
RBC: 4.48 x10E6/uL (ref 4.14–5.80)
RDW: 12.5 % (ref 11.6–15.4)
WBC: 7.1 10*3/uL (ref 3.4–10.8)

## 2023-01-25 LAB — LIPID PANEL W/O CHOL/HDL RATIO
Cholesterol, Total: 101 mg/dL (ref 100–199)
HDL: 47 mg/dL (ref >39)
LDL Chol Calc (NIH): 37 mg/dL (ref 0–99)
Triglycerides: 87 mg/dL (ref 0–149)
VLDL Cholesterol Cal: 17 mg/dL (ref 5–40)

## 2023-01-25 LAB — TSH: TSH: 1.75 u[IU]/mL (ref 0.450–4.500)

## 2023-01-25 LAB — COMPREHENSIVE METABOLIC PANEL
ALT: 71 IU/L — ABNORMAL HIGH (ref 0–44)
AST: 50 IU/L — ABNORMAL HIGH (ref 0–40)
Albumin/Globulin Ratio: 1.7
Albumin: 4.5 g/dL (ref 3.9–4.9)
Alkaline Phosphatase: 161 IU/L — ABNORMAL HIGH (ref 44–121)
BUN/Creatinine Ratio: 7 — ABNORMAL LOW (ref 10–24)
BUN: 7 mg/dL — ABNORMAL LOW (ref 8–27)
Bilirubin Total: 0.3 mg/dL (ref 0.0–1.2)
CO2: 25 mmol/L (ref 20–29)
Calcium: 9.6 mg/dL (ref 8.6–10.2)
Chloride: 101 mmol/L (ref 96–106)
Creatinine, Ser: 1.05 mg/dL (ref 0.76–1.27)
Globulin, Total: 2.7 g/dL (ref 1.5–4.5)
Glucose: 102 mg/dL — ABNORMAL HIGH (ref 70–99)
Potassium: 4.7 mmol/L (ref 3.5–5.2)
Sodium: 138 mmol/L (ref 134–144)
Total Protein: 7.2 g/dL (ref 6.0–8.5)
eGFR: 80 mL/min/{1.73_m2} (ref >59)

## 2023-01-25 LAB — PSA: Prostate Specific Ag, Serum: 0.3 ng/mL (ref 0.0–4.0)

## 2023-01-25 NOTE — Progress Notes (Signed)
Good morning, please let Mark [name] know his labs have returned and overall are stable with exception of liver function levels which are showing mild elevation again.  If using any alcohol or Tylenol (this includes opioids like Norco which have Tylenol) then please reduce use of these and we will recheck next visit.  Continue all current medications.  Any questions?  Please ensure you listen out for call from lung cancer screening team and schedule with them as I did place the referral yesterday.   Keep being amazing!!  Thank you for allowing me to participate in your care.  I appreciate you. Kindest regards, Luticia Tadros

## 2023-02-16 ENCOUNTER — Other Ambulatory Visit: Payer: Self-pay | Admitting: Nurse Practitioner

## 2023-02-16 NOTE — Telephone Encounter (Signed)
Requested Prescriptions  Pending Prescriptions Disp Refills   ferrous sulfate 325 (65 FE) MG EC tablet [Pharmacy Med Name: FERROUS SULF EC 325 MG TABLET] 90 tablet 4    Sig: TAKE 1 TABLET BY MOUTH EVERY DAY WITH BREAKFAST     Endocrinology:  Minerals - Iron Supplementation Failed - 02/16/2023  9:20 AM      Failed - Fe (serum) in normal range and within 360 days    Iron  Date Value Ref Range Status  09/15/2020 42 38 - 169 ug/dL Final   Iron Saturation  Date Value Ref Range Status  09/15/2020 14 (L) 15 - 55 % Final         Failed - Ferritin in normal range and within 360 days    Ferritin  Date Value Ref Range Status  09/15/2020 60 30 - 400 ng/mL Final         Passed - HGB in normal range and within 360 days    Hemoglobin  Date Value Ref Range Status  01/24/2023 14.1 13.0 - 17.7 g/dL Final         Passed - HCT in normal range and within 360 days    Hematocrit  Date Value Ref Range Status  01/24/2023 42.6 37.5 - 51.0 % Final         Passed - RBC in normal range and within 360 days    RBC  Date Value Ref Range Status  01/24/2023 4.48 4.14 - 5.80 x10E6/uL Final  04/04/2021 4.68 4.22 - 5.81 MIL/uL Final         Passed - Valid encounter within last 12 months    Recent Outpatient Visits           3 weeks ago Cerebral atrophy (HCC)   [place] [facility] [place], Corrie Dandy T, NP   6 months ago Encounter for [employer] annual wellness exam   [place] [facility] [place], Matador T, NP   9 months ago History of opioid abuse (HCC)   [place] [facility] [place], Corrie Dandy T, NP   10 months ago Facial droop   [place] [facility] [place], [place] T, NP   1 year ago Paraseptal emphysema (HCC)   [place] [facility] [place], Corrie Dandy T, NP       Future Appointments             In 5 months Cannady, [place] T, NP [place] [facility], [facility]             gabapentin (NEURONTIN) 300 MG  capsule [Pharmacy Med Name: GABAPENTIN 300 MG CAPSULE] 90 capsule 1    Sig: TAKE 1 CAPSULE BY MOUTH EVERYDAY AT BEDTIME     Neurology: Anticonvulsants - gabapentin Passed - 02/16/2023  9:20 AM      Passed - Cr in normal range and within 360 days    Creatinine  Date Value Ref Range Status  09/21/2011 0.94 0.60 - 1.30 mg/dL Final   Creatinine, Ser  Date Value Ref Range Status  01/24/2023 1.05 0.76 - 1.27 mg/dL Final         Passed - Completed PHQ-2 or PHQ-9 in the last 360 days      Passed - Valid encounter within last 12 months    Recent Outpatient Visits           3 weeks ago Cerebral atrophy (HCC)    [facility] [place], Corrie Dandy T, NP   6 months ago  Encounter for Medicare annual wellness exam   [place] [facility] [place], Corrie Dandy T, NP   9 months ago History of opioid abuse [facility])   [place] [facility] [place], Dorie Rank, NP   10 months ago Facial droop   [place] [facility] [place], [place] T, NP   1 year ago Paraseptal emphysema [facility])   [place] [facility] [place], Dorie Rank, NP       Future Appointments             In 5 months Cannady, Dorie Rank, NP [place] [facility], [facility]             omeprazole (PRILOSEC) 20 MG capsule [Pharmacy Med Name: OMEPRAZOLE DR 20 MG CAPSULE] 90 capsule 3    Sig: TAKE 1 CAPSULE BY MOUTH EVERY DAY     Gastroenterology: Proton Pump Inhibitors Passed - 02/16/2023  9:20 AM      Passed - Valid encounter within last 12 months    Recent Outpatient Visits           3 weeks ago Cerebral atrophy (HCC)   [place] [facility] [place], Corrie Dandy T, NP   6 months ago Encounter for [employer] annual wellness exam   [place] [facility] [place], Corrie Dandy T, NP   9 months ago History of opioid abuse [facility])   [place] [facility] [place], Corrie Dandy T, NP   10 months ago Facial droop   [place] [facility] [place], Corrie Dandy T, NP   1 year ago Paraseptal emphysema [facility])   [place] [facility] [place], Dorie Rank, NP       Future Appointments             In 5 months Cannady, Dorie Rank, NP  [facility], [facility]

## 2023-05-01 ENCOUNTER — Encounter: Payer: Self-pay | Admitting: *Deleted

## 2023-05-01 NOTE — Telephone Encounter (Signed)
Letter mailed to pt asking him to contact us to schedule follow up lung cancer screening CT when he is available.

## 2023-05-01 NOTE — Telephone Encounter (Signed)
Hey guys this is an old encounter can yall look at it

## 2023-05-11 ENCOUNTER — Ambulatory Visit: Payer: Medicare PPO | Admitting: Physician Assistant

## 2023-05-17 ENCOUNTER — Encounter: Payer: Self-pay | Admitting: Physician Assistant

## 2023-05-17 ENCOUNTER — Ambulatory Visit (INDEPENDENT_AMBULATORY_CARE_PROVIDER_SITE_OTHER): Payer: Medicare PPO | Admitting: Physician Assistant

## 2023-05-17 VITALS — BP 119/81 | HR 66 | Temp 98.8°F | Wt 148.0 lb

## 2023-05-17 DIAGNOSIS — R4189 Other symptoms and signs involving cognitive functions and awareness: Secondary | ICD-10-CM | POA: Diagnosis not present

## 2023-05-17 NOTE — Patient Instructions (Addendum)
Please go to this  office to complete your forms for long term disability   The closest one to Mark [name] is the following location   [facility]  [address] [place], [place], [place] [address] [phone]  Or you can go to the Social Security Administration Office in [place]  Physical Address: [address] [place], [place] [address]  Phone: [phone] TTY: [phone] Fax: [phone]  Hours:  Monday 9:00 AM - 4:00 PM Tuesday 9:00 AM - 4:00 PM Wednesday 9:00 AM - 4:00 PM Thursday 9:00 AM - 4:00 PM Friday 9:00 AM - 4:00 PM Saturday Closed Sunday Closed

## 2023-05-17 NOTE — Progress Notes (Signed)
Acute Office Visit   Patient: Mark [name]   DOB: 1961/02/08   62 y.o. Male  MRN: [id number] Visit Date: 05/17/2023  Today's healthcare provider: Oswaldo Conroy Corene Resnick, PA-C  Introduced myself to the patient as a Secondary school teacher and provided education on APPs in clinical practice.    Chief Complaint  Patient presents with   Arm Injury    Patient wants forms filled out regarding his arm injury   Subjective    HPI HPI     Arm Injury    Additional comments: Patient wants forms filled out regarding his arm injury      Last edited by Andre Lefort, CMA on 05/17/2023 10:15 AM.      Patient is here to complete long-term disability forms Patient was informed that we do not fill these out at this office and will need to go to [employer] office for completion Addresses and phone numbers for local offices were provided in AVS  Medications: Outpatient Medications Prior to Visit  Medication Sig   amLODipine (NORVASC) 10 MG tablet Take 1 tablet (10 mg total) by mouth daily.   citalopram (CELEXA) 20 MG tablet Take 1 tablet (20 mg total) by mouth daily.   [employer] D3 25 MCG (1000 UT) capsule TAKE 2 CAPSULES (2,000 UNITS TOTAL) BY MOUTH DAILY.   cyanocobalamin 1000 MCG tablet Take 1 tablet (1,000 mcg total) by mouth daily.   gabapentin (NEURONTIN) 300 MG capsule TAKE 1 CAPSULE BY MOUTH EVERYDAY AT BEDTIME   Melatonin 10 MG TABS Take 10 mg by mouth at bedtime.   metoprolol tartrate (LOPRESSOR) 25 MG tablet Take 1 tablet (25 mg total) by mouth 2 (two) times daily.   omeprazole (PRILOSEC) 20 MG capsule TAKE 1 CAPSULE BY MOUTH EVERY DAY   pravastatin (PRAVACHOL) 20 MG tablet Take 1 tablet (20 mg total) by mouth daily.   tamsulosin (FLOMAX) 0.4 MG CAPS capsule Take 1 capsule (0.4 mg total) by mouth daily.   No facility-administered medications prior to visit.    Review of Systems      Objective    BP 119/81   Pulse 66   Temp 98.8 F (37.1 C) (Oral)   Wt 148 lb (67.1 kg)   SpO2  98%   BMI 21.85 kg/m     Physical Exam Vitals reviewed.  Constitutional:      General: He is awake.     Appearance: Normal appearance. He is well-developed and well-groomed.  HENT:     Head: Normocephalic and atraumatic.  Neurological:     Mental Status: He is alert. Mental status is at baseline. He is confused.     GCS: GCS eye subscore is 4. GCS verbal subscore is 5. GCS motor subscore is 6.     Cranial Nerves: Dysarthria present.  Psychiatric:        Mood and Affect: Mood normal.        Speech: Speech is slurred.        Behavior: Behavior is cooperative.        Cognition and Memory: Cognition is impaired.       No results found for any visits on 05/17/23.  Assessment & Plan      No follow-ups on file.     Problem List Items Addressed This Visit       Other   Cognitive impairment - Primary   Patient was informed of Social security locations he could visit to assist  with form completion as this office does not complete long -term disability paperwork. AVS was provided with these offices as well as their phone numbers to facilitate request.   No follow-ups on file.   I, Chrishon Martino E Shaquasha Gerstel, PA-C, have reviewed all documentation for this visit. The documentation on 05/17/23 for the exam, diagnosis, procedures, and orders are all accurate and complete.   Jacquelin Hawking, MHS, PA-C [facility]

## 2023-06-03 ENCOUNTER — Inpatient Hospital Stay
Admission: EM | Admit: 2023-06-03 | Discharge: 2023-06-08 | DRG: 444 | Disposition: A | Discharge status: Home / Self Care | Payer: Medicare PPO | Attending: Internal Medicine | Admitting: Internal Medicine

## 2023-06-03 ENCOUNTER — Emergency Department: Payer: Medicare PPO

## 2023-06-03 DIAGNOSIS — E785 Hyperlipidemia, unspecified: Secondary | ICD-10-CM | POA: Diagnosis present

## 2023-06-03 DIAGNOSIS — K831 Obstruction of bile duct: Secondary | ICD-10-CM | POA: Diagnosis not present

## 2023-06-03 DIAGNOSIS — D72829 Elevated white blood cell count, unspecified: Secondary | ICD-10-CM

## 2023-06-03 DIAGNOSIS — I959 Hypotension, unspecified: Secondary | ICD-10-CM | POA: Diagnosis present

## 2023-06-03 DIAGNOSIS — R569 Unspecified convulsions: Secondary | ICD-10-CM | POA: Diagnosis not present

## 2023-06-03 DIAGNOSIS — K8309 Other cholangitis: Secondary | ICD-10-CM | POA: Diagnosis not present

## 2023-06-03 DIAGNOSIS — K8050 Calculus of bile duct without cholangitis or cholecystitis without obstruction: Secondary | ICD-10-CM

## 2023-06-03 DIAGNOSIS — Z1152 Encounter for screening for COVID-19: Secondary | ICD-10-CM

## 2023-06-03 DIAGNOSIS — I10 Essential (primary) hypertension: Secondary | ICD-10-CM | POA: Diagnosis present

## 2023-06-03 DIAGNOSIS — F1110 Opioid abuse, uncomplicated: Secondary | ICD-10-CM | POA: Diagnosis present

## 2023-06-03 DIAGNOSIS — R4182 Altered mental status, unspecified: Secondary | ICD-10-CM | POA: Diagnosis not present

## 2023-06-03 DIAGNOSIS — G9341 Metabolic encephalopathy: Secondary | ICD-10-CM | POA: Diagnosis present

## 2023-06-03 DIAGNOSIS — R519 Headache, unspecified: Secondary | ICD-10-CM | POA: Diagnosis not present

## 2023-06-03 DIAGNOSIS — Z8673 Personal history of transient ischemic attack (TIA), and cerebral infarction without residual deficits: Secondary | ICD-10-CM

## 2023-06-03 DIAGNOSIS — K3189 Other diseases of stomach and duodenum: Secondary | ICD-10-CM | POA: Diagnosis not present

## 2023-06-03 DIAGNOSIS — R932 Abnormal findings on diagnostic imaging of liver and biliary tract: Secondary | ICD-10-CM | POA: Diagnosis not present

## 2023-06-03 DIAGNOSIS — G9389 Other specified disorders of brain: Secondary | ICD-10-CM | POA: Diagnosis not present

## 2023-06-03 DIAGNOSIS — K5289 Other specified noninfective gastroenteritis and colitis: Secondary | ICD-10-CM | POA: Diagnosis present

## 2023-06-03 DIAGNOSIS — G4489 Other headache syndrome: Secondary | ICD-10-CM | POA: Diagnosis not present

## 2023-06-03 DIAGNOSIS — Z7902 Long term (current) use of antithrombotics/antiplatelets: Secondary | ICD-10-CM

## 2023-06-03 DIAGNOSIS — K449 Diaphragmatic hernia without obstruction or gangrene: Secondary | ICD-10-CM | POA: Diagnosis not present

## 2023-06-03 DIAGNOSIS — N400 Enlarged prostate without lower urinary tract symptoms: Secondary | ICD-10-CM | POA: Diagnosis present

## 2023-06-03 DIAGNOSIS — K839 Disease of biliary tract, unspecified: Secondary | ICD-10-CM | POA: Diagnosis not present

## 2023-06-03 DIAGNOSIS — K529 Noninfective gastroenteritis and colitis, unspecified: Secondary | ICD-10-CM | POA: Insufficient documentation

## 2023-06-03 DIAGNOSIS — K8031 Calculus of bile duct with cholangitis, unspecified, with obstruction: Secondary | ICD-10-CM | POA: Diagnosis present

## 2023-06-03 DIAGNOSIS — F419 Anxiety disorder, unspecified: Secondary | ICD-10-CM | POA: Diagnosis present

## 2023-06-03 DIAGNOSIS — I6521 Occlusion and stenosis of right carotid artery: Secondary | ICD-10-CM | POA: Diagnosis not present

## 2023-06-03 DIAGNOSIS — Z79899 Other long term (current) drug therapy: Secondary | ICD-10-CM | POA: Diagnosis not present

## 2023-06-03 DIAGNOSIS — F17210 Nicotine dependence, cigarettes, uncomplicated: Secondary | ICD-10-CM | POA: Diagnosis present

## 2023-06-03 DIAGNOSIS — I6782 Cerebral ischemia: Secondary | ICD-10-CM | POA: Diagnosis not present

## 2023-06-03 DIAGNOSIS — K219 Gastro-esophageal reflux disease without esophagitis: Secondary | ICD-10-CM | POA: Diagnosis present

## 2023-06-03 DIAGNOSIS — R531 Weakness: Secondary | ICD-10-CM | POA: Diagnosis not present

## 2023-06-03 DIAGNOSIS — Z7982 Long term (current) use of aspirin: Secondary | ICD-10-CM | POA: Diagnosis not present

## 2023-06-03 DIAGNOSIS — K838 Other specified diseases of biliary tract: Secondary | ICD-10-CM | POA: Diagnosis not present

## 2023-06-03 DIAGNOSIS — I700 Atherosclerosis of aorta: Secondary | ICD-10-CM | POA: Diagnosis not present

## 2023-06-03 DIAGNOSIS — I6389 Other cerebral infarction: Secondary | ICD-10-CM | POA: Diagnosis not present

## 2023-06-03 DIAGNOSIS — I6381 Other cerebral infarction due to occlusion or stenosis of small artery: Secondary | ICD-10-CM | POA: Diagnosis not present

## 2023-06-03 DIAGNOSIS — F32A Depression, unspecified: Secondary | ICD-10-CM | POA: Diagnosis present

## 2023-06-03 DIAGNOSIS — I639 Cerebral infarction, unspecified: Secondary | ICD-10-CM | POA: Diagnosis not present

## 2023-06-03 DIAGNOSIS — A419 Sepsis, unspecified organism: Secondary | ICD-10-CM | POA: Diagnosis present

## 2023-06-03 DIAGNOSIS — R410 Disorientation, unspecified: Secondary | ICD-10-CM | POA: Diagnosis not present

## 2023-06-03 DIAGNOSIS — R9089 Other abnormal findings on diagnostic imaging of central nervous system: Secondary | ICD-10-CM | POA: Diagnosis not present

## 2023-06-03 DIAGNOSIS — Z8249 Family history of ischemic heart disease and other diseases of the circulatory system: Secondary | ICD-10-CM | POA: Diagnosis not present

## 2023-06-03 DIAGNOSIS — K8689 Other specified diseases of pancreas: Secondary | ICD-10-CM | POA: Diagnosis present

## 2023-06-03 DIAGNOSIS — Z885 Allergy status to narcotic agent status: Secondary | ICD-10-CM | POA: Diagnosis not present

## 2023-06-03 DIAGNOSIS — I9589 Other hypotension: Secondary | ICD-10-CM | POA: Diagnosis not present

## 2023-06-03 DIAGNOSIS — K7689 Other specified diseases of liver: Secondary | ICD-10-CM | POA: Diagnosis not present

## 2023-06-03 DIAGNOSIS — I672 Cerebral atherosclerosis: Secondary | ICD-10-CM | POA: Diagnosis not present

## 2023-06-03 DIAGNOSIS — R29818 Other symptoms and signs involving the nervous system: Secondary | ICD-10-CM | POA: Diagnosis not present

## 2023-06-03 LAB — CBC WITH DIFFERENTIAL/PLATELET
Abs Immature Granulocytes: 0.06 10*3/uL (ref 0.00–0.07)
Basophils Absolute: 0.1 10*3/uL (ref 0.0–0.1)
Basophils Relative: 1 %
Eosinophils Absolute: 0.3 10*3/uL (ref 0.0–0.5)
Eosinophils Relative: 2 %
HCT: 47 % (ref 39.0–52.0)
Hemoglobin: 15.5 g/dL (ref 13.0–17.0)
Immature Granulocytes: 0 %
Lymphocytes Relative: 24 %
Lymphs Abs: 3.9 10*3/uL (ref 0.7–4.0)
MCH: 30.7 pg (ref 26.0–34.0)
MCHC: 33 g/dL (ref 30.0–36.0)
MCV: 93.1 fL (ref 80.0–100.0)
Monocytes Absolute: 1.9 10*3/uL — ABNORMAL HIGH (ref 0.1–1.0)
Monocytes Relative: 12 %
Neutro Abs: 10 10*3/uL — ABNORMAL HIGH (ref 1.7–7.7)
Neutrophils Relative %: 61 %
Platelets: 326 10*3/uL (ref 150–400)
RBC: 5.05 MIL/uL (ref 4.22–5.81)
RDW: 13 % (ref 11.5–15.5)
WBC: 16.2 10*3/uL — ABNORMAL HIGH (ref 4.0–10.5)
nRBC: 0 % (ref 0.0–0.2)

## 2023-06-03 LAB — COMPREHENSIVE METABOLIC PANEL
ALT: 47 U/L — ABNORMAL HIGH (ref 0–44)
AST: 32 U/L (ref 15–41)
Albumin: 4 g/dL (ref 3.5–5.0)
Alkaline Phosphatase: 175 U/L — ABNORMAL HIGH (ref 38–126)
Anion gap: 11 (ref 5–15)
BUN: 11 mg/dL (ref 8–23)
CO2: 25 mmol/L (ref 22–32)
Calcium: 9 mg/dL (ref 8.9–10.3)
Chloride: 99 mmol/L (ref 98–111)
Creatinine, Ser: 1.1 mg/dL (ref 0.61–1.24)
GFR, Estimated: >60 mL/min (ref >60)
Glucose, Bld: 112 mg/dL — ABNORMAL HIGH (ref 70–99)
Potassium: 3.8 mmol/L (ref 3.5–5.1)
Sodium: 135 mmol/L (ref 135–145)
Total Bilirubin: 0.6 mg/dL (ref 0.3–1.2)
Total Protein: 7.4 g/dL (ref 6.5–8.1)

## 2023-06-03 LAB — TROPONIN I (HIGH SENSITIVITY): Troponin I (High Sensitivity): 6 ng/L (ref <18)

## 2023-06-03 MED ORDER — SODIUM CHLORIDE 0.9 % IV BOLUS
500.0000 mL | Freq: Once | INTRAVENOUS | Status: AC
  Administered 2023-06-03: 500 mL via INTRAVENOUS

## 2023-06-03 MED ORDER — VANCOMYCIN HCL IN DEXTROSE 1-5 GM/200ML-% IV SOLN: 1000.0000 mg | Freq: Once | INTRAVENOUS | Status: DC

## 2023-06-03 MED ORDER — LACTATED RINGERS IV BOLUS (SEPSIS)
1000.0000 mL | Freq: Once | INTRAVENOUS | Status: AC
  Administered 2023-06-04: 1000 mL via INTRAVENOUS

## 2023-06-03 MED ORDER — VANCOMYCIN HCL 1500 MG/300ML IV SOLN
1500.0000 mg | Freq: Once | INTRAVENOUS | Status: AC
  Administered 2023-06-04: 1500 mg via INTRAVENOUS
  Filled 2023-06-03: qty 300

## 2023-06-03 MED ORDER — SODIUM CHLORIDE 0.9 % IV SOLN
2.0000 g | Freq: Once | INTRAVENOUS | Status: AC
  Administered 2023-06-03: 2 g via INTRAVENOUS
  Filled 2023-06-03: qty 12.5

## 2023-06-03 MED ORDER — LACTATED RINGERS IV BOLUS (SEPSIS)
1000.0000 mL | Freq: Once | INTRAVENOUS | Status: AC
  Administered 2023-06-03: 1000 mL via INTRAVENOUS

## 2023-06-03 MED ORDER — METRONIDAZOLE 500 MG/100ML IV SOLN
500.0000 mg | Freq: Once | INTRAVENOUS | Status: AC
  Administered 2023-06-04: 500 mg via INTRAVENOUS
  Filled 2023-06-03: qty 100

## 2023-06-03 MED ORDER — LACTATED RINGERS IV BOLUS (SEPSIS)
500.0000 mL | Freq: Once | INTRAVENOUS | Status: AC
  Administered 2023-06-04: 500 mL via INTRAVENOUS

## 2023-06-03 NOTE — ED Notes (Signed)
Patient transported to X-ray 

## 2023-06-03 NOTE — Progress Notes (Signed)
PHARMACY -  BRIEF ANTIBIOTIC NOTE   Pharmacy has received consult(s) for Vancomycin from an ED provider.  The patient's profile has been reviewed for ht / wt / allergies / indication / available labs.    One time order(s) placed for: Vancomycin 1500 mg per pt wt: 67.1 kg from prior note on 05/17/23  Further antibiotics/pharmacy consults should be ordered by admitting physician if indicated.                       Thank you, Otelia Sergeant, PharmD, [facility] 06/03/2023 11:35 PM

## 2023-06-03 NOTE — ED Triage Notes (Signed)
Pt arrives via EMS from home. Per report, Family has noticed increased weakness, not acting his normal, hx of stroke x 2 previously. Stroke normal. CBG 124, BP 117/84, 95%, 93 Hr, c/o headache. Oriented.

## 2023-06-03 NOTE — Sepsis Progress Note (Signed)
Elink following code sepsis °

## 2023-06-03 NOTE — ED Provider Notes (Signed)
[facility] Provider Note    Event Date/Time   First MD Initiated Contact with Patient 06/03/23 2317     (approximate)   History   Fatigue   HPI  Mark [name] is a 63 y.o. male with history of hypertension, CVA x 2, previous history of opioid abuse who presents to the emergency department with complaints of not feeling well.  Patient denies any pain.  He denies headache, chest pain, shortness of breath, vomiting, diarrhea.  Hypotensive in triage.  States he did take his blood pressure medication today.   History provided by patient.    Past Medical History:  Diagnosis Date   Depression    History of kidney stones    Hypertension    Stroke [facility])     Past Surgical History:  Procedure Laterality Date   APPENDECTOMY     bowel obstruction     COLONOSCOPY N/A 09/07/2020   Procedure: COLONOSCOPY;  Surgeon: Pasty Spillers, MD;  Location: [facility] ENDOSCOPY;  Service: Endoscopy;  Laterality: N/A;   ESOPHAGOGASTRODUODENOSCOPY (EGD) WITH PROPOFOL N/A 09/03/2020   Procedure: ESOPHAGOGASTRODUODENOSCOPY (EGD) WITH PROPOFOL;  Surgeon: Toney Reil, MD;  Location: [facility] ENDOSCOPY;  Service: Gastroenterology;  Laterality: N/A;   EXTRACORPOREAL SHOCK WAVE LITHOTRIPSY Right 05/15/2019   Procedure: EXTRACORPOREAL SHOCK WAVE LITHOTRIPSY (ESWL);  Surgeon: Malen Gauze, MD;  Location: [facility] ORS;  Service: Urology;  Laterality: Right;    MEDICATIONS:  Prior to Admission medications   Medication Sig Start Date End Date Taking? Authorizing Provider  amLODipine (NORVASC) 10 MG tablet Take 1 tablet (10 mg total) by mouth daily. 07/25/22   Cannady, Corrie Dandy T, NP  citalopram (CELEXA) 20 MG tablet Take 1 tablet (20 mg total) by mouth daily. 07/25/22   Cannady, Corrie Dandy T, NP  CVS D3 25 MCG (1000 UT) capsule TAKE 2 CAPSULES (2,000 UNITS TOTAL) BY MOUTH DAILY. 01/15/23   Johnson, Megan P, DO  cyanocobalamin 1000 MCG tablet Take 1 tablet (1,000 mcg total) by mouth  daily. 11/14/21   Cannady, Corrie Dandy T, NP  gabapentin (NEURONTIN) 300 MG capsule TAKE 1 CAPSULE BY MOUTH EVERYDAY AT BEDTIME 02/16/23   Cannady, Jolene T, NP  Melatonin 10 MG TABS Take 10 mg by mouth at bedtime. 07/25/22   Cannady, Corrie Dandy T, NP  metoprolol tartrate (LOPRESSOR) 25 MG tablet Take 1 tablet (25 mg total) by mouth 2 (two) times daily. 07/25/22   Cannady, Corrie Dandy T, NP  omeprazole (PRILOSEC) 20 MG capsule TAKE 1 CAPSULE BY MOUTH EVERY DAY 02/16/23   Cannady, Corrie Dandy T, NP  pravastatin (PRAVACHOL) 20 MG tablet Take 1 tablet (20 mg total) by mouth daily. 07/25/22   Cannady, Corrie Dandy T, NP  tamsulosin (FLOMAX) 0.4 MG CAPS capsule Take 1 capsule (0.4 mg total) by mouth daily. 07/25/22   Marjie Skiff, NP    Physical Exam   Triage Vital Signs: ED Triage Vitals  Encounter Vitals Group     BP 06/03/23 2206 (!) 85/72     Systolic BP Percentile --      Diastolic BP Percentile --      Pulse Rate 06/03/23 2206 88     Resp 06/03/23 2206 19     Temp 06/03/23 2206 97.9 F (36.6 C)     Temp Source 06/03/23 2206 Oral     SpO2 06/03/23 2206 95 %     Weight --      Height --      Head Circumference --  Peak Flow --      Pain Score 06/03/23 2213 8     Pain Loc --      Pain Education --      Exclude from Growth Chart --     Most recent vital signs: Vitals:   06/04/23 0053 06/04/23 0054  BP:  110/80  Pulse:  83  Resp:  15  Temp: (!) 97.1 F (36.2 C)   SpO2:  98%    CONSTITUTIONAL: Alert, responds appropriately to questions.  Oriented x 4.  Appears drowsy. HEAD: Normocephalic, atraumatic EYES: Conjunctivae clear, pupils appear equal, sclera nonicteric ENT: normal nose; moist mucous membranes NECK: Supple, normal ROM CARD: RRR; S1 and S2 appreciated RESP: Normal chest excursion without splinting or tachypnea; breath sounds clear and equal bilaterally; no wheezes, no rhonchi, no rales, no hypoxia or respiratory distress, speaking full sentences ABD/GI: Non-distended; soft,  non-tender, no rebound, no guarding, no peritoneal signs BACK: The back appears normal EXT: Normal ROM in all joints; no deformity noted, no edema SKIN: Normal color for age and race; warm; no rash on exposed skin NEURO: Moves all extremities equally, lightly slurred speech, normal sensation, no facial asymmetry PSYCH: The patient's mood and manner are appropriate.   ED Results / Procedures / Treatments   LABS: (all labs ordered are listed, but only abnormal results are displayed) Labs Reviewed  CBC WITH DIFFERENTIAL/PLATELET - Abnormal; Notable for the following components:      Result Value   WBC 16.2 (*)    Neutro Abs 10.0 (*)    Monocytes Absolute 1.9 (*)    All other components within normal limits  COMPREHENSIVE METABOLIC PANEL - Abnormal; Notable for the following components:   Glucose, Bld 112 (*)    ALT 47 (*)    Alkaline Phosphatase 175 (*)    All other components within normal limits  RESP PANEL BY RT-PCR (RSV, FLU A&B, COVID)  RVPGX2  CULTURE, BLOOD (ROUTINE X 2)  CULTURE, BLOOD (ROUTINE X 2)  LACTIC ACID, PLASMA  PROTIME-INR  APTT  ETHANOL  URINALYSIS, ROUTINE W REFLEX MICROSCOPIC  LACTIC ACID, PLASMA  URINALYSIS, W/ REFLEX TO CULTURE (INFECTION SUSPECTED)  URINE DRUG SCREEN, QUALITATIVE ([facility] ONLY)  TROPONIN I (HIGH SENSITIVITY)  TROPONIN I (HIGH SENSITIVITY)     EKG:  EKG Interpretation Date/Time:  Sunday June 03 2023 22:09:10 EDT Ventricular Rate:  93 PR Interval:  138 QRS Duration:  84 QT Interval:  368 QTC Calculation: 457 R Axis:   -6  Text Interpretation:  Poor data quality, interpretation may be adversely affected Normal sinus rhythm Nonspecific ST abnormality Abnormal ECG When compared with ECG of 02-Sep-2020 15:02, No significant change was found Confirmed by Rochele Raring [phone]) on 06/03/2023 11:40:48 PM         RADIOLOGY: My personal review and interpretation of imaging: CT head shows no acute abnormality.  Chest x-ray clear.  I  have personally reviewed all radiology reports.   CT HEAD WO CONTRAST ( )  Result Date: 06/04/2023 CLINICAL DATA:  Mental status change of unknown cause. Unwell feeling. EXAM: CT HEAD WITHOUT CONTRAST TECHNIQUE: Contiguous axial images were obtained from the base of the skull through the vertex without intravenous contrast. RADIATION DOSE REDUCTION: This exam was performed according to the departmental dose-optimization program which includes automated exposure control, adjustment of the mA and/or kV according to patient size and/or use of iterative reconstruction technique. COMPARISON:  01/18/2023 FINDINGS: Brain: Mild diffuse cerebral atrophy. Ventricular dilatation consistent with central atrophy. Low-attenuation changes in  the deep white matter consistent with small vessel ischemia. Areas of encephalomalacia demonstrated throughout the bilateral frontal regions, left parietal region, and right occipital lobe. These areas are unchanged since prior study and likely represent old infarcts. No mass-effect or midline shift. No abnormal extra-axial fluid collections. Gray-white matter junctions are distinct. Basal cisterns are not effaced. No acute intracranial hemorrhage. Vascular: Ectatic basilar artery. No aneurysm. Mild vascular calcifications. Skull: Normal. Negative for fracture or focal lesion. Sinuses/Orbits: Mucosal thickening in the paranasal sinuses. No acute air-fluid levels. Mastoid air cells are clear. Other: None. IMPRESSION: 1. No acute intracranial abnormalities. 2. Chronic atrophy and small vessel ischemic changes. 3. Multiple old infarcts, unchanged since prior study. Electronically Signed   By: Burman Nieves M.D.   On: 06/04/2023 00:45   DG Chest Portable 1 View  Result Date: 06/03/2023 CLINICAL DATA:  Hypotension and weakness. History of previous strokes. Headache. EXAM: PORTABLE CHEST 1 VIEW COMPARISON:  03/31/2021 FINDINGS: Slightly shallow inspiration. Heart size and pulmonary  vascularity are normal. Lungs are clear. No pleural effusions. No pneumothorax. Mediastinal contours appear intact. IMPRESSION: No active disease. Electronically Signed   By: Burman Nieves M.D.   On: 06/03/2023 23:16     PROCEDURES:  Critical Care performed: Yes, see critical care procedure note(s)   CRITICAL CARE Performed by: Baxter Hire Jahmeir Geisen   Total critical care time: 45 minutes  Critical care time was exclusive of separately billable procedures and treating other patients.  Critical care was necessary to treat or prevent imminent or life-threatening deterioration.  Critical care was time spent personally by me on the following activities: development of treatment plan with patient and/or surrogate as well as nursing, discussions with consultants, evaluation of patient's response to treatment, examination of patient, obtaining history from patient or surrogate, ordering and performing treatments and interventions, ordering and review of laboratory studies, ordering and review of radiographic studies, pulse oximetry and re-evaluation of patient's condition.   Marland Kitchen1-3 Lead EKG Interpretation  Performed by: Kymoni Monday, Layla Maw, DO Authorized by: Shavonta Gossen, Layla Maw, DO     Interpretation: normal     ECG rate:  83   ECG rate assessment: normal     Rhythm: sinus rhythm     Ectopy: none     Conduction: normal       IMPRESSION / MDM / ASSESSMENT AND PLAN / ED COURSE  I reviewed the triage vital signs and the nursing notes.    Patient here with complaints of not feeling well.  Patient has been hypotensive in triage.  The patient is on the cardiac monitor to evaluate for evidence of arrhythmia and/or significant heart rate changes.   DIFFERENTIAL DIAGNOSIS (includes but not limited to):   Sepsis, anemia, dehydration, stroke, electrolyte derangement, hypoglycemia, less likely cardiogenic shock   Patient's presentation is most consistent with acute presentation with potential threat to  life or bodily function.   PLAN: Will obtain labs, cultures, urine, chest x-ray, head CT.  Will give broad-spectrum antibiotics and 30 mL/kg IV fluid bolus for sepsis.   MEDICATIONS GIVEN IN ED: Medications  lactated ringers bolus 1,000 mL (0 mLs Intravenous Stopped 06/04/23 0025)    And  lactated ringers bolus 1,000 mL (1,000 mLs Intravenous New Bag/Given 06/04/23 0055)    And  lactated ringers bolus 500 mL (has no administration in time range)  metroNIDAZOLE (FLAGYL) IVPB 500 mg (500 mg Intravenous New Bag/Given 06/04/23 0056)  vancomycin (VANCOREADY) IVPB 1500 mg/300 mL (has no administration in time range)  sodium chloride 0.9 %  bolus 500 mL (0 mLs Intravenous Stopped 06/03/23 2250)  ceFEPIme (MAXIPIME) 2 g in sodium chloride 0.9 % 100 mL IVPB (0 g Intravenous Stopped 06/04/23 0026)     ED COURSE: Rectal temperature is 97.1.  Patient has leukocytosis of 16,000.  Normal lactic.  Blood pressure improving with IV hydration.  Chest x-ray reviewed and interpreted by myself and the radiologist and is clear.  CT head also reviewed and interpreted by myself and the radiologist and shows no acute abnormality.  Blood pressure has improved to 110/80 with IV fluids.  Will discuss with hospitalist for admission.   CONSULTS:  Consulted and discussed patient's case with hospitalist, Dr. Arville Care.  I have recommended admission and consulting physician agrees and will place admission orders.  Patient (and family if present) agree with this plan.   I reviewed all nursing notes, vitals, pertinent previous records.  All labs, EKGs, imaging ordered have been independently reviewed and interpreted by myself.    OUTSIDE RECORDS REVIEWED: Reviewed recent PCP notes.       FINAL CLINICAL IMPRESSION(S) / ED DIAGNOSES   Final diagnoses:  Hypotension, unspecified hypotension type  Leukocytosis, unspecified type     Rx / DC Orders   ED Discharge Orders     None        Note:  This document  was prepared using Dragon voice recognition software and may include unintentional dictation errors.   Tiffay Pinette, Layla Maw, DO 06/04/23 [phone]

## 2023-06-03 NOTE — ED Triage Notes (Signed)
Patient brought in by EMS with complaints of "I just feel bad" but does not elaborate on how he feels bad; He denies chest pain, shortness of breath, nausea, cough, diarrhea; Patient is found to be hypotensive with BP 85/72

## 2023-06-04 ENCOUNTER — Emergency Department: Payer: Medicare PPO

## 2023-06-04 ENCOUNTER — Other Ambulatory Visit: Payer: Self-pay

## 2023-06-04 ENCOUNTER — Inpatient Hospital Stay: Payer: Medicare PPO

## 2023-06-04 ENCOUNTER — Encounter: Payer: Self-pay | Admitting: Family Medicine

## 2023-06-04 DIAGNOSIS — K8050 Calculus of bile duct without cholangitis or cholecystitis without obstruction: Secondary | ICD-10-CM

## 2023-06-04 DIAGNOSIS — Z7982 Long term (current) use of aspirin: Secondary | ICD-10-CM | POA: Diagnosis not present

## 2023-06-04 DIAGNOSIS — I639 Cerebral infarction, unspecified: Secondary | ICD-10-CM | POA: Diagnosis not present

## 2023-06-04 DIAGNOSIS — Z79899 Other long term (current) drug therapy: Secondary | ICD-10-CM | POA: Diagnosis not present

## 2023-06-04 DIAGNOSIS — I10 Essential (primary) hypertension: Secondary | ICD-10-CM | POA: Diagnosis present

## 2023-06-04 DIAGNOSIS — R4182 Altered mental status, unspecified: Secondary | ICD-10-CM | POA: Diagnosis not present

## 2023-06-04 DIAGNOSIS — K8309 Other cholangitis: Secondary | ICD-10-CM

## 2023-06-04 DIAGNOSIS — R410 Disorientation, unspecified: Secondary | ICD-10-CM | POA: Diagnosis not present

## 2023-06-04 DIAGNOSIS — E785 Hyperlipidemia, unspecified: Secondary | ICD-10-CM

## 2023-06-04 DIAGNOSIS — F32A Depression, unspecified: Secondary | ICD-10-CM | POA: Diagnosis present

## 2023-06-04 DIAGNOSIS — N400 Enlarged prostate without lower urinary tract symptoms: Secondary | ICD-10-CM | POA: Insufficient documentation

## 2023-06-04 DIAGNOSIS — I959 Hypotension, unspecified: Secondary | ICD-10-CM | POA: Diagnosis present

## 2023-06-04 DIAGNOSIS — R569 Unspecified convulsions: Secondary | ICD-10-CM | POA: Diagnosis not present

## 2023-06-04 DIAGNOSIS — F17210 Nicotine dependence, cigarettes, uncomplicated: Secondary | ICD-10-CM | POA: Diagnosis present

## 2023-06-04 DIAGNOSIS — Z8673 Personal history of transient ischemic attack (TIA), and cerebral infarction without residual deficits: Secondary | ICD-10-CM | POA: Diagnosis not present

## 2023-06-04 DIAGNOSIS — K219 Gastro-esophageal reflux disease without esophagitis: Secondary | ICD-10-CM | POA: Diagnosis present

## 2023-06-04 DIAGNOSIS — Z8249 Family history of ischemic heart disease and other diseases of the circulatory system: Secondary | ICD-10-CM | POA: Diagnosis not present

## 2023-06-04 DIAGNOSIS — Z1152 Encounter for screening for COVID-19: Secondary | ICD-10-CM | POA: Diagnosis not present

## 2023-06-04 DIAGNOSIS — I6389 Other cerebral infarction: Secondary | ICD-10-CM | POA: Diagnosis not present

## 2023-06-04 DIAGNOSIS — F1110 Opioid abuse, uncomplicated: Secondary | ICD-10-CM | POA: Diagnosis present

## 2023-06-04 DIAGNOSIS — K5289 Other specified noninfective gastroenteritis and colitis: Secondary | ICD-10-CM | POA: Diagnosis present

## 2023-06-04 DIAGNOSIS — K8031 Calculus of bile duct with cholangitis, unspecified, with obstruction: Secondary | ICD-10-CM | POA: Diagnosis present

## 2023-06-04 DIAGNOSIS — K8689 Other specified diseases of pancreas: Secondary | ICD-10-CM | POA: Diagnosis present

## 2023-06-04 DIAGNOSIS — Z7902 Long term (current) use of antithrombotics/antiplatelets: Secondary | ICD-10-CM | POA: Diagnosis not present

## 2023-06-04 DIAGNOSIS — G9341 Metabolic encephalopathy: Secondary | ICD-10-CM | POA: Diagnosis present

## 2023-06-04 DIAGNOSIS — I6381 Other cerebral infarction due to occlusion or stenosis of small artery: Secondary | ICD-10-CM | POA: Diagnosis not present

## 2023-06-04 DIAGNOSIS — D72829 Elevated white blood cell count, unspecified: Secondary | ICD-10-CM | POA: Diagnosis not present

## 2023-06-04 DIAGNOSIS — A419 Sepsis, unspecified organism: Secondary | ICD-10-CM | POA: Diagnosis present

## 2023-06-04 DIAGNOSIS — F419 Anxiety disorder, unspecified: Secondary | ICD-10-CM | POA: Diagnosis present

## 2023-06-04 DIAGNOSIS — Z885 Allergy status to narcotic agent status: Secondary | ICD-10-CM | POA: Diagnosis not present

## 2023-06-04 DIAGNOSIS — R932 Abnormal findings on diagnostic imaging of liver and biliary tract: Secondary | ICD-10-CM | POA: Diagnosis not present

## 2023-06-04 DIAGNOSIS — K839 Disease of biliary tract, unspecified: Secondary | ICD-10-CM | POA: Diagnosis not present

## 2023-06-04 LAB — PROTIME-INR
INR: 1.1 (ref 0.8–1.2)
INR: 1.1 (ref 0.8–1.2)
Prothrombin Time: 14 s (ref 11.4–15.2)
Prothrombin Time: 14.3 s (ref 11.4–15.2)

## 2023-06-04 LAB — CBC
HCT: 38.3 % — ABNORMAL LOW (ref 39.0–52.0)
Hemoglobin: 12.4 g/dL — ABNORMAL LOW (ref 13.0–17.0)
MCH: 30.8 pg (ref 26.0–34.0)
MCHC: 32.4 g/dL (ref 30.0–36.0)
MCV: 95.3 fL (ref 80.0–100.0)
Platelets: 223 10*3/uL (ref 150–400)
RBC: 4.02 MIL/uL — ABNORMAL LOW (ref 4.22–5.81)
RDW: 13 % (ref 11.5–15.5)
WBC: 12.6 10*3/uL — ABNORMAL HIGH (ref 4.0–10.5)
nRBC: 0 % (ref 0.0–0.2)

## 2023-06-04 LAB — URINE DRUG SCREEN, QUALITATIVE (ARMC ONLY)
Amphetamines, Ur Screen: NOT DETECTED
Barbiturates, Ur Screen: NOT DETECTED
Benzodiazepine, Ur Scrn: NOT DETECTED
Cannabinoid 50 Ng, Ur ~~LOC~~: NOT DETECTED
Cocaine Metabolite,Ur ~~LOC~~: NOT DETECTED
MDMA (Ecstasy)Ur Screen: NOT DETECTED
Methadone Scn, Ur: NOT DETECTED
Opiate, Ur Screen: NOT DETECTED
Phencyclidine (PCP) Ur S: NOT DETECTED
Tricyclic, Ur Screen: NOT DETECTED

## 2023-06-04 LAB — URINALYSIS, W/ REFLEX TO CULTURE (INFECTION SUSPECTED)
Bacteria, UA: NONE SEEN
Bilirubin Urine: NEGATIVE
Glucose, UA: NEGATIVE mg/dL
Hgb urine dipstick: NEGATIVE
Ketones, ur: NEGATIVE mg/dL
Leukocytes,Ua: NEGATIVE
Nitrite: NEGATIVE
Protein, ur: NEGATIVE mg/dL
Specific Gravity, Urine: 1.015 (ref 1.005–1.030)
Squamous Epithelial / HPF: 0 /[HPF] (ref 0–5)
pH: 5 (ref 5.0–8.0)

## 2023-06-04 LAB — RESP PANEL BY RT-PCR (RSV, FLU A&B, COVID)  RVPGX2
Influenza A by PCR: NEGATIVE
Influenza B by PCR: NEGATIVE
Resp Syncytial Virus by PCR: NEGATIVE
SARS Coronavirus 2 by RT PCR: NEGATIVE

## 2023-06-04 LAB — BASIC METABOLIC PANEL
Anion gap: 7 (ref 5–15)
BUN: 8 mg/dL (ref 8–23)
CO2: 21 mmol/L — ABNORMAL LOW (ref 22–32)
Calcium: 7.8 mg/dL — ABNORMAL LOW (ref 8.9–10.3)
Chloride: 107 mmol/L (ref 98–111)
Creatinine, Ser: 0.84 mg/dL (ref 0.61–1.24)
GFR, Estimated: >60 mL/min (ref >60)
Glucose, Bld: 105 mg/dL — ABNORMAL HIGH (ref 70–99)
Potassium: 3.5 mmol/L (ref 3.5–5.1)
Sodium: 135 mmol/L (ref 135–145)

## 2023-06-04 LAB — CORTISOL-AM, BLOOD: Cortisol - AM: 10.3 ug/dL (ref 6.7–22.6)

## 2023-06-04 LAB — PROCALCITONIN: Procalcitonin: 0.29 ng/mL

## 2023-06-04 LAB — APTT: aPTT: 31 s (ref 24–36)

## 2023-06-04 LAB — HIV ANTIBODY (ROUTINE TESTING W REFLEX): HIV Screen 4th Generation wRfx: NONREACTIVE

## 2023-06-04 LAB — LACTIC ACID, PLASMA: Lactic Acid, Venous: 1.6 mmol/L (ref 0.5–1.9)

## 2023-06-04 LAB — TROPONIN I (HIGH SENSITIVITY): Troponin I (High Sensitivity): 5 ng/L (ref <18)

## 2023-06-04 LAB — ETHANOL: Alcohol, Ethyl (B): <10 mg/dL (ref <10)

## 2023-06-04 MED ORDER — MELATONIN 5 MG PO TABS
10.0000 mg | ORAL_TABLET | Freq: Every day | ORAL | Status: DC
  Administered 2023-06-04 – 2023-06-07 (×5): 10 mg via ORAL
  Filled 2023-06-04 (×6): qty 2

## 2023-06-04 MED ORDER — PANTOPRAZOLE SODIUM 40 MG PO TBEC
40.0000 mg | DELAYED_RELEASE_TABLET | Freq: Every day | ORAL | Status: DC
  Administered 2023-06-04 – 2023-06-08 (×5): 40 mg via ORAL
  Filled 2023-06-04 (×5): qty 1

## 2023-06-04 MED ORDER — PRAVASTATIN SODIUM 20 MG PO TABS: 20.0000 mg | ORAL_TABLET | Freq: Every day | ORAL | Status: DC

## 2023-06-04 MED ORDER — ACETAMINOPHEN 325 MG PO TABS
650.0000 mg | ORAL_TABLET | Freq: Four times a day (QID) | ORAL | Status: DC | PRN
  Administered 2023-06-04: 650 mg via ORAL
  Filled 2023-06-04: qty 2

## 2023-06-04 MED ORDER — SODIUM CHLORIDE 0.9 % IV SOLN: 2.0000 g | Freq: Once | INTRAVENOUS | Status: DC

## 2023-06-04 MED ORDER — VANCOMYCIN HCL IN DEXTROSE 1-5 GM/200ML-% IV SOLN
1000.0000 mg | Freq: Two times a day (BID) | INTRAVENOUS | Status: DC
  Administered 2023-06-04 – 2023-06-05 (×2): 1000 mg via INTRAVENOUS
  Filled 2023-06-04 (×3): qty 200

## 2023-06-04 MED ORDER — VANCOMYCIN HCL IN DEXTROSE 1-5 GM/200ML-% IV SOLN: 1000.0000 mg | Freq: Once | INTRAVENOUS | Status: DC

## 2023-06-04 MED ORDER — ONDANSETRON HCL 4 MG PO TABS: 4.0000 mg | ORAL_TABLET | Freq: Four times a day (QID) | ORAL | Status: DC | PRN

## 2023-06-04 MED ORDER — ONDANSETRON HCL 4 MG/2ML IJ SOLN
4.0000 mg | Freq: Four times a day (QID) | INTRAMUSCULAR | Status: DC | PRN
  Administered 2023-06-04: 4 mg via INTRAVENOUS
  Filled 2023-06-04: qty 2

## 2023-06-04 MED ORDER — ACETAMINOPHEN 650 MG RE SUPP: 650.0000 mg | Freq: Four times a day (QID) | RECTAL | Status: DC | PRN

## 2023-06-04 MED ORDER — LACTATED RINGERS IV SOLN
150.0000 mL/h | INTRAVENOUS | Status: AC
  Administered 2023-06-04 (×2): 150 mL/h via INTRAVENOUS

## 2023-06-04 MED ORDER — AMLODIPINE BESYLATE 10 MG PO TABS
10.0000 mg | ORAL_TABLET | Freq: Every day | ORAL | Status: DC
  Administered 2023-06-04 – 2023-06-05 (×2): 10 mg via ORAL
  Filled 2023-06-04 (×2): qty 2

## 2023-06-04 MED ORDER — VANCOMYCIN HCL 1250 MG/250ML IV SOLN: 1250.0000 mg | INTRAVENOUS | Status: DC

## 2023-06-04 MED ORDER — MORPHINE SULFATE (PF) 2 MG/ML IV SOLN
1.0000 mg | INTRAVENOUS | Status: DC | PRN
  Administered 2023-06-04: 2 mg via INTRAVENOUS
  Administered 2023-06-05: 1 mg via INTRAVENOUS
  Administered 2023-06-05: 2 mg via INTRAVENOUS
  Administered 2023-06-06: 1 mg via INTRAVENOUS
  Administered 2023-06-06 (×2): 2 mg via INTRAVENOUS
  Filled 2023-06-04 (×7): qty 1

## 2023-06-04 MED ORDER — METRONIDAZOLE 500 MG/100ML IV SOLN
500.0000 mg | Freq: Two times a day (BID) | INTRAVENOUS | Status: DC
  Administered 2023-06-04 – 2023-06-07 (×7): 500 mg via INTRAVENOUS
  Filled 2023-06-04 (×9): qty 100

## 2023-06-04 MED ORDER — IOHEXOL 300 MG/ML  SOLN
100.0000 mL | Freq: Once | INTRAMUSCULAR | Status: AC | PRN
  Administered 2023-06-04: 100 mL via INTRAVENOUS

## 2023-06-04 MED ORDER — GABAPENTIN 300 MG PO CAPS
300.0000 mg | ORAL_CAPSULE | Freq: Every day | ORAL | Status: DC
  Administered 2023-06-04: 300 mg via ORAL
  Filled 2023-06-04: qty 1

## 2023-06-04 MED ORDER — TAMSULOSIN HCL 0.4 MG PO CAPS
0.4000 mg | ORAL_CAPSULE | Freq: Every day | ORAL | Status: DC
  Administered 2023-06-04 – 2023-06-08 (×5): 0.4 mg via ORAL
  Filled 2023-06-04 (×5): qty 1

## 2023-06-04 MED ORDER — SODIUM CHLORIDE 0.9 % IV SOLN
2.0000 g | Freq: Three times a day (TID) | INTRAVENOUS | Status: AC
  Administered 2023-06-04 – 2023-06-05 (×5): 2 g via INTRAVENOUS
  Filled 2023-06-04 (×5): qty 12.5

## 2023-06-04 MED ORDER — TRAZODONE HCL 50 MG PO TABS: 25.0000 mg | ORAL_TABLET | Freq: Every evening | ORAL | Status: DC | PRN

## 2023-06-04 MED ORDER — VITAMIN D3 25 MCG (1000 UNIT) PO TABS
2000.0000 [IU] | ORAL_TABLET | Freq: Every day | ORAL | Status: DC
  Administered 2023-06-04 – 2023-06-08 (×4): 2000 [IU] via ORAL
  Filled 2023-06-04 (×10): qty 2

## 2023-06-04 MED ORDER — MAGNESIUM HYDROXIDE 400 MG/5ML PO SUSP: 30.0000 mL | Freq: Every day | ORAL | Status: DC | PRN

## 2023-06-04 MED ORDER — METOPROLOL TARTRATE 25 MG PO TABS
25.0000 mg | ORAL_TABLET | Freq: Two times a day (BID) | ORAL | Status: DC
  Administered 2023-06-04 – 2023-06-08 (×8): 25 mg via ORAL
  Filled 2023-06-04 (×9): qty 1

## 2023-06-04 MED ORDER — ENOXAPARIN SODIUM 40 MG/0.4ML IJ SOSY
40.0000 mg | PREFILLED_SYRINGE | INTRAMUSCULAR | Status: DC
Start: 2023-06-04 — End: 2023-06-08
  Administered 2023-06-04 – 2023-06-08 (×4): 40 mg via SUBCUTANEOUS
  Filled 2023-06-04 (×5): qty 0.4

## 2023-06-04 MED ORDER — CITALOPRAM HYDROBROMIDE 20 MG PO TABS
20.0000 mg | ORAL_TABLET | Freq: Every day | ORAL | Status: DC
  Administered 2023-06-04 – 2023-06-08 (×5): 20 mg via ORAL
  Filled 2023-06-04 (×5): qty 1

## 2023-06-04 MED ORDER — GADOBUTROL 1 MMOL/ML IV SOLN
6.0000 mL | Freq: Once | INTRAVENOUS | Status: AC | PRN
  Administered 2023-06-04: 6 mL via INTRAVENOUS

## 2023-06-04 MED ORDER — VITAMIN B-12 1000 MCG PO TABS
1000.0000 ug | ORAL_TABLET | Freq: Every day | ORAL | Status: DC
  Administered 2023-06-04 – 2023-06-08 (×4): 1000 ug via ORAL
  Filled 2023-06-04: qty 1
  Filled 2023-06-04: qty 2
  Filled 2023-06-04: qty 1
  Filled 2023-06-04: qty 2
  Filled 2023-06-04: qty 1

## 2023-06-04 MED ORDER — PRAVASTATIN SODIUM 20 MG PO TABS
20.0000 mg | ORAL_TABLET | Freq: Every day | ORAL | Status: DC
  Administered 2023-06-04 – 2023-06-05 (×2): 20 mg via ORAL
  Filled 2023-06-04 (×2): qty 1

## 2023-06-04 NOTE — Assessment & Plan Note (Addendum)
-   The patient will have an MRCP. - GI consultation will be obtained. - I notified Dr. Norma Fredrickson about the patient and Dr. Tobi Bastos is aware. - Will follow LFTs. - Will check lipase level and keep the patient n.p.o. for now except for medications.

## 2023-06-04 NOTE — Progress Notes (Signed)
Patient is seen and examined today morning.  He is admitted to hospitalist service for further management evaluation of generalized weakness, fatigue.  Patient's blood pressures have been on the lower side, abdomen pelvic CT showed evidence of mild diffuse proctocolitis, interval worsening bilateral] ectatic dilation.  MRCP suggested.  An MRCP done revealed biliary and pancreatic duct dilation with possible ampullary soft tissue fullness advised ERCP.  GI consulted who recommended to continue antibiotics, Dr. Servando Snare to do ERCP tomorrow if patient remains stable.  Further management as per clinical course.

## 2023-06-04 NOTE — Assessment & Plan Note (Signed)
- 

## 2023-06-04 NOTE — ED Notes (Signed)
Received pt from night shift  Pt is up to the bathroom to clean himself up  Is able to walk unassisted Place back in bed  clean gown on  IV infusing well Awaiting room assignment

## 2023-06-04 NOTE — Assessment & Plan Note (Signed)
Will continue Flomax.

## 2023-06-04 NOTE — Progress Notes (Signed)
CODE SEPSIS - PHARMACY COMMUNICATION  **Broad Spectrum Antibiotics should be administered within 1 hour of Sepsis diagnosis**  Time Code Sepsis Called/Page Received: 2339  Antibiotics Ordered: Cefepime, Flagyl, Vancomycin  Time of 1st antibiotic administration: 2356  Otelia Sergeant, PharmD, [facility] 06/04/2023 12:11 AM

## 2023-06-04 NOTE — Consult Note (Signed)
[name] , MD [address], [address], [place], [place], [address] [address], [place], [place], [address] Phone: [phone]  Fax: [phone]  Consultation  Referring Provider:     Er  Primary Care Physician:  Marjie Skiff, NP Primary Gastroenterologist:  Dr. Maximino Greenland         Reason for Consultation:     Biliary obstruction   Date of Admission:  06/03/2023 Date of Consultation:  06/04/2023         HPI:   Mark [name] is a 62 y.o. male has a prior history of a gastric Dula Foy lesion treated by Dr. Maximino Greenland in January 2022.  At the same time colonoscopy showed a large polyp in the sigmoid colon which was not resected.  Patient was supposed to follow back in a few weeks for a colonoscopy but did not do so.  The patient presented to the emergency room last night with cough and greenish sputum over the last week with occasional dyspnea or wheezing.  He was hypotensive in the triage.  Which improved with hydration.  Leukocytosis was noted.  Underwent a CT scan of the abdomen with contrast that showed mild diffuse proctocolitis no bowel obstruction prior ileocecectomy, biliary and pancreatic ductal dilation with a noncalcified filling defect in the most distal common bile duct small hiatal hernia.  This was followed by with an MRCP that showed biliary and pancreatic ductal dilation, to be focal soft tissue fullness of the ampulla recommended ERCP.  Labs on admission hemoglobin 12.4 g with a white cell count of 12.6 procalcitonin not elevated.  INR 1.1, troponin elevated blood cultures pending on admission alkaline phosphatase 175 ALT 47 AST 32 total bilirubin 0.6 alkaline phosphatase mildly elevated for over a year.  He is being treated for sepsis with cefepime vancomycin and Flagyl presently I have been consulted to evaluate if the patient requires an ERCP.  Patient is a poor historian states he has been having intermittent generalized abdominal pain going on  for a few days which brought him into the hospital.  Presently states the pain is much better.  He denies any fever but he says he has had chills.  No other complaints. Past Medical History:  Diagnosis Date   Depression    History of kidney stones    Hypertension    Stroke [facility])     Past Surgical History:  Procedure Laterality Date   APPENDECTOMY     bowel obstruction     COLONOSCOPY N/A 09/07/2020   Procedure: COLONOSCOPY;  Surgeon: Pasty Spillers, MD;  Location: [facility] ENDOSCOPY;  Service: Endoscopy;  Laterality: N/A;   ESOPHAGOGASTRODUODENOSCOPY (EGD) WITH PROPOFOL N/A 09/03/2020   Procedure: ESOPHAGOGASTRODUODENOSCOPY (EGD) WITH PROPOFOL;  Surgeon: Toney Reil, MD;  Location[facility] ENDOSCOPY;  Service: Gastroenterology;  Laterality: N/A;   EXTRACORPOREAL SHOCK WAVE LITHOTRIPSY Right 05/15/2019   Procedure: EXTRACORPOREAL SHOCK WAVE LITHOTRIPSY (ESWL);  Surgeon: Malen Gauze, MD;  Location: [facility] ORS;  Service: Urology;  Laterality: Right;    Prior to Admission medications   Medication Sig Start Date End Date Taking? Authorizing Provider  amLODipine (NORVASC) 10 MG tablet Take 1 tablet (10 mg total) by mouth daily. 07/25/22   Cannady, Corrie Dandy T, NP  citalopram (CELEXA) 20 MG tablet Take 1 tablet (20 mg total) by mouth daily. 07/25/22   Cannady, Corrie Dandy T, NP  [employer] D3 25 MCG (1000 UT) capsule TAKE 2 CAPSULES (2,000 UNITS TOTAL) BY MOUTH DAILY. 01/15/23   Laural Benes,  Megan P, DO  cyanocobalamin 1000 MCG tablet Take 1 tablet (1,000 mcg total) by mouth daily. 11/14/21   Cannady, Corrie Dandy T, NP  gabapentin (NEURONTIN) 300 MG capsule TAKE 1 CAPSULE BY MOUTH EVERYDAY AT BEDTIME 02/16/23   Cannady, Jolene T, NP  Melatonin 10 MG TABS Take 10 mg by mouth at bedtime. 07/25/22   Cannady, Corrie Dandy T, NP  metoprolol tartrate (LOPRESSOR) 25 MG tablet Take 1 tablet (25 mg total) by mouth 2 (two) times daily. 07/25/22   Cannady, Corrie Dandy T, NP  omeprazole (PRILOSEC) 20 MG capsule TAKE 1 CAPSULE BY MOUTH  EVERY DAY 02/16/23   Cannady, Corrie Dandy T, NP  pravastatin (PRAVACHOL) 20 MG tablet Take 1 tablet (20 mg total) by mouth daily. 07/25/22   Cannady, Corrie Dandy T, NP  tamsulosin (FLOMAX) 0.4 MG CAPS capsule Take 1 capsule (0.4 mg total) by mouth daily. 07/25/22   Marjie Skiff, NP    Family History  Problem Relation Age of Onset   Heart disease Father    Seizures Brother      Social History   Tobacco Use   Smoking status: Every Day    Current packs/day: 1.00    Average packs/day: 1 pack/day for 43.0 years (43.0 ttl pk-yrs)    Types: Cigarettes   Smokeless tobacco: Never  Vaping Use   Vaping status: Never Used  Substance Use Topics   Alcohol use: Not Currently    Alcohol/week: 0.0 standard drinks of alcohol   Drug use: Not Currently    Allergies as of 06/03/2023 - Reviewed 06/03/2023  Allergen Reaction Noted   Codeine Hives 05/03/2015    Review of Systems:    All systems reviewed and negative except where noted in HPI.   Physical Exam:  Vital signs in last 24 hours: Temp:  [97.1 F (36.2 C)-97.9 F (36.6 C)] 97.7 F (36.5 C) (10/21 1203) Pulse Rate:  [62-88] 64 (10/21 1330) Resp:  [10-19] 10 (10/21 1330) BP: (81-120)/(67-80) 120/80 (10/21 1330) SpO2:  [94 %-98 %] 96 % (10/21 1330) Weight:  [67.1 kg] 67.1 kg (10/21 0141)   General:   Pleasant, cooperative in NAD Head:  Normocephalic and atraumatic. Eyes:   No icterus.   Conjunctiva pink. PERRLA. Ears:  Normal auditory acuity. Neck:  Supple; no masses or thyroidomegaly Lungs: Respirations even and unlabored. Lungs clear to auscultation bilaterally.   No wheezes, crackles, or rhonchi.  Heart:  Regular rate and rhythm;  Without murmur, clicks, rubs or gallops Abdomen:  Soft, nondistended, nontender. Normal bowel sounds. No appreciable masses or hepatomegaly.  No rebound or guarding.  Neurologic:  Alert and oriented x3;  grossly normal neurologically. Skin:  Intact without significant lesions or rashes. Cervical Nodes:   No significant cervical adenopathy. Psych:  Alert and cooperative. Normal affect.  LAB RESULTS: Recent Labs    06/03/23 2213 06/04/23 0334  WBC 16.2* 12.6*  HGB 15.5 12.4*  HCT 47.0 38.3*  PLT 326 223   BMET Recent Labs    06/03/23 2213 06/04/23 0334  NA 135 135  K 3.8 3.5  CL 99 107  CO2 25 21*  GLUCOSE 112* 105*  BUN 11 8  CREATININE 1.10 0.84  CALCIUM 9.0 7.8*   LFT Recent Labs    06/03/23 2213  PROT 7.4  ALBUMIN 4.0  AST 32  ALT 47*  ALKPHOS 175*  BILITOT 0.6   PT/INR Recent Labs    06/03/23 2352 06/04/23 0334  LABPROT 14.0 14.3  INR 1.1 1.1    STUDIES: MR  ABDOMEN MRCP W WO CONTAST  Result Date: 06/04/2023 CLINICAL DATA:  Biliary obstruction on CT EXAM: MRI ABDOMEN WITHOUT AND WITH CONTRAST (INCLUDING MRCP) TECHNIQUE: Multiplanar multisequence MR imaging of the abdomen was performed both before and after the administration of intravenous contrast. Heavily T2-weighted images of the biliary and pancreatic ducts were obtained, and three-dimensional MRCP images were rendered by post processing. CONTRAST:  6mL GADAVIST GADOBUTROL 1 MMOL/ML IV SOLN COMPARISON:  CT of earlier today and on 03/27/2021 FINDINGS: Mild to moderate motion degradation throughout. Lower chest: Normal heart size without pericardial or pleural effusion. Hepatobiliary: Anterior segment 4B hepatic cyst of 1.5 cm. Scattered arterial phase tiny foci of hyperenhancement are likely perfusion anomalies. Cholecystectomy. Mild-to-moderate intrahepatic biliary duct dilatation. Mild extrahepatic duct dilatation at 12 mm on 15/3. Followed to the level of the ampulla. Equivocal soft tissue fullness at the ampulla including on 27/4 (12 mm). Pancreas: Mild pancreatic duct dilatation including at 5 mm in the head on 14/16. No dominant pancreatic mass or surrounding inflammation. Spleen: Macrolobulated appearance of the spleen may relate to remote ischemic insult. Adrenals/Urinary Tract: Normal adrenal glands.  Tiny nonenhancing renal lesions are likely cysts . In the absence of clinically indicated signs/symptoms require(s) no independent follow-up. No hydronephrosis. Stomach/Bowel: Susceptibility artifact from endoscopy clip within the gastric cardia. Normal abdominal bowel loops. Vascular/Lymphatic: Aortic atherosclerosis. No retroperitoneal or retrocrural adenopathy. Other:  No ascites. Musculoskeletal: No acute osseous abnormality. IMPRESSION: 1. Motion degraded exam. 2. Biliary and pancreatic duct dilatation followed to the level of the ampulla. No obstructive stone. Equivocal soft tissue fullness at the ampulla. Recommend ERCP to evaluate for ampullary stenosis versus ampullary mass. 3.  Aortic Atherosclerosis (ICD10-I70.0). Electronically Signed   By: Jeronimo Greaves M.D.   On: 06/04/2023 09:17   MR 3D Recon At Scanner  Result Date: 06/04/2023 CLINICAL DATA:  Biliary obstruction on CT EXAM: MRI ABDOMEN WITHOUT AND WITH CONTRAST (INCLUDING MRCP) TECHNIQUE: Multiplanar multisequence MR imaging of the abdomen was performed both before and after the administration of intravenous contrast. Heavily T2-weighted images of the biliary and pancreatic ducts were obtained, and three-dimensional MRCP images were rendered by post processing. CONTRAST:  6mL GADAVIST GADOBUTROL 1 MMOL/ML IV SOLN COMPARISON:  CT of earlier today and on 03/27/2021 FINDINGS: Mild to moderate motion degradation throughout. Lower chest: Normal heart size without pericardial or pleural effusion. Hepatobiliary: Anterior segment 4B hepatic cyst of 1.5 cm. Scattered arterial phase tiny foci of hyperenhancement are likely perfusion anomalies. Cholecystectomy. Mild-to-moderate intrahepatic biliary duct dilatation. Mild extrahepatic duct dilatation at 12 mm on 15/3. Followed to the level of the ampulla. Equivocal soft tissue fullness at the ampulla including on 27/4 (12 mm). Pancreas: Mild pancreatic duct dilatation including at 5 mm in the head on 14/16.  No dominant pancreatic mass or surrounding inflammation. Spleen: Macrolobulated appearance of the spleen may relate to remote ischemic insult. Adrenals/Urinary Tract: Normal adrenal glands. Tiny nonenhancing renal lesions are likely cysts . In the absence of clinically indicated signs/symptoms require(s) no independent follow-up. No hydronephrosis. Stomach/Bowel: Susceptibility artifact from endoscopy clip within the gastric cardia. Normal abdominal bowel loops. Vascular/Lymphatic: Aortic atherosclerosis. No retroperitoneal or retrocrural adenopathy. Other:  No ascites. Musculoskeletal: No acute osseous abnormality. IMPRESSION: 1. Motion degraded exam. 2. Biliary and pancreatic duct dilatation followed to the level of the ampulla. No obstructive stone. Equivocal soft tissue fullness at the ampulla. Recommend ERCP to evaluate for ampullary stenosis versus ampullary mass. 3.  Aortic Atherosclerosis (ICD10-I70.0). Electronically Signed   By: Hosie Spangle.D.  On: 06/04/2023 09:17   CT ABDOMEN PELVIS W CONTRAST  Result Date: 06/04/2023 CLINICAL DATA:  Abdominal pain. EXAM: CT ABDOMEN AND PELVIS WITH CONTRAST TECHNIQUE: Multidetector CT imaging of the abdomen and pelvis was performed using the standard protocol following bolus administration of intravenous contrast. RADIATION DOSE REDUCTION: This exam was performed according to the departmental dose-optimization program which includes automated exposure control, adjustment of the mA and/or kV according to patient size and/or use of iterative reconstruction technique. CONTRAST:  OMNIPAQUE IOHEXOL 300 MG/ML  SOLN COMPARISON:  CTs with IV contrast 03/27/2021 and 09/02/2020. FINDINGS: Lower chest: The lung bases are clear of infiltrates, with diffuse bronchial thickening and scattered linear scarring or atelectasis in both bases, chronic mildly elevated right diaphragm. Small hiatal hernia. The cardiac size is normal. There are three-vessel coronary artery  calcifications. No pericardial effusion. Hepatobiliary: The liver is 18 cm length mildly steatotic. There is no mass enhancement. Again noted is a rounded 2 cm area of focal periligamentous fat in segment 4 B. Old cholecystectomy. There is interval worsened intrahepatic and extrahepatic biliary dilatation today with the common bile duct now measuring 1.5 cm. There is a noncalcified filling defect in the most distal duct, coronal series 5 image 45 measuring 1.5 x 1.2 cm. Unknown if this is a soft tissue mass or a noncalcified ductal stone. Pancreas: There is so increased pancreatic ductal dilatation, with pancreatic duct measuring 4-5 mm. There is no discrete pancreatic mass enhancement and no inflammatory changes. The gland again noted partially atrophic. Spleen: Lobular and deformed but not enlarged. There are 2 adjacent splenules also unchanged. There are dystrophic calcifications. This is probably due to old splenic trauma or infection versus infarctions. Adrenals/Urinary Tract: No adrenal mass. Mild cortical thinning noted both kidneys. There is no renal mass enhancement, no urinary stone or obstruction. There is a moderately distended bladder with the dome reaching almost after L4-5 but there is no wall thickening. Stomach/Bowel: Unremarkable stomach. Unremarkable unopacified small bowel. Evidence of prior ileocecectomy with primary anastomosis end to side. There is mild thickening in the colorectal wall and mucosal enhancement, fluid in the colon. Findings most likely due to a mild diffuse proctocolitis. There is no pneumatosis. Vascular/Lymphatic: Aortic atherosclerosis. No enlarged abdominal or pelvic lymph nodes. Reproductive: Prostate is unremarkable. Other: No free fluid, free hemorrhage, free air or incarcerated hernias. Scattered anterior abdominal surgical clips. Musculoskeletal: Degenerative change lumbar spine greatest at L4-5 and L5-S1. No acute or other significant osseous findings. IMPRESSION: 1.  Evidence of a mild diffuse proctocolitis. No bowel obstruction, pneumatosis or perforation. Prior ileocecectomy. 2. Interval worsened biliary and pancreatic ductal dilatation, with a 1.5 x 1.2 cm noncalcified filling defect in the most distal common bile duct. Unknown if this is a soft tissue mass or a noncalcified ductal stone. MRCP with pre and postcontrast images, or ERCP recommended. 3. Moderately distended urinary bladder without wall thickening. 4. Aortic and coronary artery atherosclerosis. 5. Chronic changes in the spleen. 6. Small hiatal hernia. 7. Bronchitis. Aortic Atherosclerosis (ICD10-I70.0). Electronically Signed   By: Almira Bar M.D.   On: 06/04/2023 05:58   CT HEAD WO CONTRAST ( )  Result Date: 06/04/2023 CLINICAL DATA:  Mental status change of unknown cause. Unwell feeling. EXAM: CT HEAD WITHOUT CONTRAST TECHNIQUE: Contiguous axial images were obtained from the base of the skull through the vertex without intravenous contrast. RADIATION DOSE REDUCTION: This exam was performed according to the departmental dose-optimization program which includes automated exposure control, adjustment of the mA and/or kV according  to patient size and/or use of iterative reconstruction technique. COMPARISON:  01/18/2023 FINDINGS: Brain: Mild diffuse cerebral atrophy. Ventricular dilatation consistent with central atrophy. Low-attenuation changes in the deep white matter consistent with small vessel ischemia. Areas of encephalomalacia demonstrated throughout the bilateral frontal regions, left parietal region, and right occipital lobe. These areas are unchanged since prior study and likely represent old infarcts. No mass-effect or midline shift. No abnormal extra-axial fluid collections. Gray-white matter junctions are distinct. Basal cisterns are not effaced. No acute intracranial hemorrhage. Vascular: Ectatic basilar artery. No aneurysm. Mild vascular calcifications. Skull: Normal. Negative for fracture  or focal lesion. Sinuses/Orbits: Mucosal thickening in the paranasal sinuses. No acute air-fluid levels. Mastoid air cells are clear. Other: None. IMPRESSION: 1. No acute intracranial abnormalities. 2. Chronic atrophy and small vessel ischemic changes. 3. Multiple old infarcts, unchanged since prior study. Electronically Signed   By: Burman Nieves M.D.   On: 06/04/2023 00:45   DG Chest Portable 1 View  Result Date: 06/03/2023 CLINICAL DATA:  Hypotension and weakness. History of previous strokes. Headache. EXAM: PORTABLE CHEST 1 VIEW COMPARISON:  03/31/2021 FINDINGS: Slightly shallow inspiration. Heart size and pulmonary vascularity are normal. Lungs are clear. No pleural effusions. No pneumothorax. Mediastinal contours appear intact. IMPRESSION: No active disease. Electronically Signed   By: Burman Nieves M.D.   On: 06/03/2023 23:16      Impression / Plan:   Mark [name] is a 62 y.o. y/o male with a history of GERD hypertension prior history of a GI bleed in 2022 presents to the hospital with fatigue and features concerning for sepsis.  Being treated presently with broad-spectrum antibiotics.  CAT scan and MRCP shows possible biliary obstruction at the level of the ampulla.  Happening for few days has been chronically elevated for about a year no prior evaluation.  Concern for either an ampullary mass versus stricture versus stone.  Other less common but possible diagnoses include sphincter of Oddi I dysfunction.  It is possible that his sepsis could be from cholangitis  Plan 1.  Continue antibiotics follow-up on cultures 2.  Discussed with Dr. Servando Snare if hemodynamically stable we will plan for ERCP tomorrow if not stable at any point of time would need to consider percutaneous biliary drain. 3.  As an outpatient he requires a colonoscopy since the large polyp was seen in 2022 but his prep was inadequate and he was supposed to follow-up as an outpatient but did not do so 4.  Some mention of  colitis on CT scan if he has diarrhea check stool for GI PCR and C. difficile if he is having solid stools please do not check the stool for infection   I have discussed alternative options, risks & benefits,  which include, but are not limited to, bleeding, infection, perforation,respiratory complication, pancreatitis,  drug reaction and death.  The patient agrees with this plan & written consent will be obtained.     Thank you for involving me in the care of this patient.      LOS: 0 days   [name], MD  06/04/2023, 1:54 PM

## 2023-06-04 NOTE — ED Notes (Signed)
Pharmacy contacted for melatonin

## 2023-06-04 NOTE — Progress Notes (Signed)
Pharmacy Antibiotic Note  Mark [name] is a 62 y.o. male admitted on 06/03/2023 with sepsis from diffuse proctocolitis.  Pharmacy has been consulted for Cefepime & Vancomycin dosing for 7 days.  Scr improved from 1.10 to 0.84.  Plan: Continue cefepime 2 grams every 8 hours  Adjust vancomycin 1,000 mg every 12 hours Goal AUC 400-550 Expected AUC 549.7, Cmin 15.2 Used Scr 0.84, TBW, Vd 0.72  Pharmacy will continue to follow and will adjust abx dosing whenever warranted.  Temp (24hrs), Avg:97.4 F (36.3 C), Min:97.1 F (36.2 C), Max:97.9 F (36.6 C)   Recent Labs  Lab 06/03/23 2213 06/03/23 2352 06/04/23 0334  WBC 16.2*  --  12.6*  CREATININE 1.10  --  0.84  LATICACIDVEN  --  1.6  --     Estimated Creatinine Clearance: 85.4 mL/min (by C-G formula based on SCr of 0.84 mg/dL).    Allergies  Allergen Reactions   Codeine Hives    Antimicrobials this admission: 10/21 Cefepime >> x 7 days 10/21 Flagyl >> x 7 days 10/21 Vancomycin >> x 7 days  Microbiology results: 10/20 BCx: no growth < 12 hours  Thank you for allowing pharmacy to be a part of this patient's care.  Elliot Gurney, PharmD, BCPS Clinical Pharmacist  06/04/2023 10:12 AM

## 2023-06-04 NOTE — Assessment & Plan Note (Signed)
-   The patient will be admitted to a medical telemetry bed. - His sepsis is likely secondary to his diffuse proctocolitis. - He will be continued on IV cefepime, vancomycin and Flagyl. - He will be continued on hydration with IV lactated Ringer. - We will follow blood cultures.

## 2023-06-04 NOTE — Progress Notes (Signed)
Pharmacy Antibiotic Note  Mark [name] is a 62 y.o. male admitted on 06/03/2023 with sepsis from unknown source.  Pharmacy has been consulted for Cefepime & Vancomycin dosing for 7 days.  Plan: Cefepime 2 gm q8hr per indication & renal fxn.  Pt given Vancomycin 1500 mg once. Vancomycin 1250 mg IV Q 24 hrs. Goal AUC 400-550. Expected AUC: 441.9 SCr used: 1.1, TBW 67.1 kg < IBW 70.7 kg  Pharmacy will continue to follow and will adjust abx dosing whenever warranted.  Temp (24hrs), Avg:97.5 F (36.4 C), Min:97.1 F (36.2 C), Max:97.9 F (36.6 C)   Recent Labs  Lab 06/03/23 2213 06/03/23 2352  WBC 16.2*  --   CREATININE 1.10  --   LATICACIDVEN  --  1.6    CrCl cannot be calculated (Unknown ideal weight.).    Allergies  Allergen Reactions   Codeine Hives    Antimicrobials this admission: 10/21 Cefepime >> x 7 days 10/21 Flagyl >> x 7 days 10/21 Vancomycin >> x 7 days  Microbiology results: 10/20 BCx: Pending  Thank you for allowing pharmacy to be a part of this patient's care.  Otelia Sergeant, PharmD, MBA 06/04/2023 1:56 AM

## 2023-06-04 NOTE — Assessment & Plan Note (Signed)
-   We will continue statin therapy. 

## 2023-06-04 NOTE — Assessment & Plan Note (Signed)
-   We will obtain an MRCP. - I notified Dr. Norma Fredrickson about the patient. -Will follow LFTs. - This could be contributing to his abdominal pain. - Pain management will be provided.

## 2023-06-04 NOTE — Assessment & Plan Note (Signed)
-   We will continue PPI therapy 

## 2023-06-04 NOTE — H&P (Addendum)
[place]   PATIENT NAME: Mark [name]    MR#:  [id number]  DATE OF BIRTH:  August 06, 1961  DATE OF ADMISSION:  06/03/2023  PRIMARY CARE PHYSICIAN: Marjie Skiff, NP   Patient is coming from: Home  REQUESTING/REFERRING PHYSICIAN: Ward, Layla Maw, DO  CHIEF COMPLAINT:   Chief Complaint  Patient presents with   Fatigue    HISTORY OF PRESENT ILLNESS:  Mark [name] is a 62 y.o. male with medical history significant for essential hypertension, CVA, opioid abuse, depression, presented to the emergency room with acute onset of generalized fatigue and weakness and malaise.  He admitted to cough occasionally productive of greenish sputum over the last week with occasional dyspnea without wheezing.  He denied any fever or chills.  He has been having occasional abdominal pain with nausea and vomiting.  When he was hypotensive in triage he was having mild lightheadedness and dizziness.  No paresthesias or focal muscle weakness.  No dysuria, oliguria or hematuria or flank pain.  He was slow to answer questions during my interview and would fall asleep easily.  ED Course: When he came to the ER, BP was 85/72 that came up with hydration to 110/80 with otherwise normal vital signs.  Labs revealed an alk phos of 175 with ALT 47 and AST 32 and otherwise unremarkable CMP.  High sensitive troponin I was 6 elevated 5.  Lactic acid was 1.6.  CBC showed leukocytosis of 16.2 with neutrophilia.  PT and INR as well as PTT were normal.  Respiratory panel came back negative.  UA was unremarkable.  Urine drug screen was negative.  Alcohol level was less than 10.  Blood cultures were drawn. EKG as reviewed by me : EKG showed normal sinus rhythm with rate of 93. Imaging: Noncontrast head CT scan revealed no acute intracranial normalities.  It showed chronic atrophy and small vessel ischemic changes as well as multiple old infarcts that are unchanged since prior study.  I ordered an abdominal pelvic  CT scan with contrast given his abdominal pain and it showed the following: 1. Evidence of a mild diffuse proctocolitis. No bowel obstruction, pneumatosis or perforation. Prior ileocecectomy. 2. Interval worsened biliary and pancreatic ductal dilatation, with a 1.5 x 1.2 cm noncalcified filling defect in the most distal common bile duct. Unknown if this is a soft tissue mass or a noncalcified ductal stone. MRCP with pre and postcontrast images, or ERCP recommended. 3. Moderately distended urinary bladder without wall thickening. 4. Aortic and coronary artery atherosclerosis. 5. Chronic changes in the spleen. 6. Small hiatal hernia. 7. Bronchitis.  The patient was given broad-spectrum IV antibiotic therapy with IV cefepime, vancomycin and Flagyl as well as 2.5 L of IV lactated ringer and 500 mL IV normal saline bolus. PAST MEDICAL HISTORY:   Past Medical History:  Diagnosis Date   Depression    History of kidney stones    Hypertension    Stroke [facility])     PAST SURGICAL HISTORY:   Past Surgical History:  Procedure Laterality Date   APPENDECTOMY     bowel obstruction     COLONOSCOPY N/A 09/07/2020   Procedure: COLONOSCOPY;  Surgeon: Pasty Spillers, MD;  Location: [facility] ENDOSCOPY;  Service: Endoscopy;  Laterality: N/A;   ESOPHAGOGASTRODUODENOSCOPY (EGD) WITH PROPOFOL N/A 09/03/2020   Procedure: ESOPHAGOGASTRODUODENOSCOPY (EGD) WITH PROPOFOL;  Surgeon: Toney Reil, MD;  Location: [facility] ENDOSCOPY;  Service: Gastroenterology;  Laterality: N/A;   EXTRACORPOREAL SHOCK WAVE LITHOTRIPSY Right 05/15/2019  Procedure: EXTRACORPOREAL SHOCK WAVE LITHOTRIPSY (ESWL);  Surgeon: Malen Gauze, MD;  Location: [facility] ORS;  Service: Urology;  Laterality: Right;    SOCIAL HISTORY:   Social History   Tobacco Use   Smoking status: Every Day    Current packs/day: 1.00    Average packs/day: 1 pack/day for 43.0 years (43.0 ttl pk-yrs)    Types: Cigarettes   Smokeless tobacco: Never   Substance Use Topics   Alcohol use: Not Currently    Alcohol/week: 0.0 standard drinks of alcohol    FAMILY HISTORY:   Family History  Problem Relation Age of Onset   Heart disease Father    Seizures Brother     DRUG ALLERGIES:   Allergies  Allergen Reactions   Codeine Hives    REVIEW OF SYSTEMS:   ROS As per history of present illness. All pertinent systems were reviewed above. Constitutional, HEENT, cardiovascular, respiratory, GI, GU, musculoskeletal, neuro, psychiatric, endocrine, integumentary and hematologic systems were reviewed and are otherwise negative/unremarkable except for positive findings mentioned above in the HPI.   MEDICATIONS AT HOME:   Prior to Admission medications   Medication Sig Start Date End Date Taking? Authorizing Provider  amLODipine (NORVASC) 10 MG tablet Take 1 tablet (10 mg total) by mouth daily. 07/25/22   Cannady, Corrie Dandy T, NP  citalopram (CELEXA) 20 MG tablet Take 1 tablet (20 mg total) by mouth daily. 07/25/22   Cannady, Corrie Dandy T, NP  [employer] D3 25 MCG (1000 UT) capsule TAKE 2 CAPSULES (2,000 UNITS TOTAL) BY MOUTH DAILY. 01/15/23   Johnson, Megan P, DO  cyanocobalamin 1000 MCG tablet Take 1 tablet (1,000 mcg total) by mouth daily. 11/14/21   Cannady, Corrie Dandy T, NP  gabapentin (NEURONTIN) 300 MG capsule TAKE 1 CAPSULE BY MOUTH EVERYDAY AT BEDTIME 02/16/23   Cannady, Jolene T, NP  Melatonin 10 MG TABS Take 10 mg by mouth at bedtime. 07/25/22   Cannady, Corrie Dandy T, NP  metoprolol tartrate (LOPRESSOR) 25 MG tablet Take 1 tablet (25 mg total) by mouth 2 (two) times daily. 07/25/22   Cannady, Corrie Dandy T, NP  omeprazole (PRILOSEC) 20 MG capsule TAKE 1 CAPSULE BY MOUTH EVERY DAY 02/16/23   Cannady, Corrie Dandy T, NP  pravastatin (PRAVACHOL) 20 MG tablet Take 1 tablet (20 mg total) by mouth daily. 07/25/22   Cannady, Corrie Dandy T, NP  tamsulosin (FLOMAX) 0.4 MG CAPS capsule Take 1 capsule (0.4 mg total) by mouth daily. 07/25/22   Cannady, Corrie Dandy T, NP      VITAL SIGNS:   Blood pressure 110/80, pulse 83, temperature (!) 97.1 F (36.2 C), temperature source Rectal, resp. rate 15, weight 67.1 kg, SpO2 98%.  PHYSICAL EXAMINATION:  Physical Exam  GENERAL:  62 y.o.-year-old patient lying in the bed with no acute distress.  EYES: Pupils equal, round, reactive to light and accommodation. No scleral icterus. Extraocular muscles intact.  HEENT: Head atraumatic, normocephalic. Oropharynx and nasopharynx clear.  NECK:  Supple, no jugular venous distention. No thyroid enlargement, no tenderness.  LUNGS: Normal breath sounds bilaterally, no wheezing, rales,rhonchi or crepitation. No use of accessory muscles of respiration.  CARDIOVASCULAR: Regular rate and rhythm, S1, S2 normal. No murmurs, rubs, or gallops.  ABDOMEN: Soft, nondistended, nontender. Bowel sounds present. No organomegaly or mass.  EXTREMITIES: No pedal edema, cyanosis, or clubbing.  NEUROLOGIC: Cranial nerves II through XII are intact. Muscle strength 5/5 in all extremities. Sensation intact. Gait not checked.  PSYCHIATRIC: The patient is alert and oriented x 3.  Normal affect and good eye  contact. SKIN: No obvious rash, lesion, or ulcer.   LABORATORY PANEL:   CBC Recent Labs  Lab 06/04/23 0334  WBC 12.6*  HGB 12.4*  HCT 38.3*  PLT 223   ------------------------------------------------------------------------------------------------------------------  Chemistries  Recent Labs  Lab 06/03/23 2213 06/04/23 0334  NA 135 135  K 3.8 3.5  CL 99 107  CO2 25 21*  GLUCOSE 112* 105*  BUN 11 8  CREATININE 1.10 0.84  CALCIUM 9.0 7.8*  AST 32  --   ALT 47*  --   ALKPHOS 175*  --   BILITOT 0.6  --    ------------------------------------------------------------------------------------------------------------------  Cardiac Enzymes No results for input(s): "TROPONINI" in the last 168  hours. ------------------------------------------------------------------------------------------------------------------  RADIOLOGY:  CT ABDOMEN PELVIS W CONTRAST  Result Date: 06/04/2023 CLINICAL DATA:  Abdominal pain. EXAM: CT ABDOMEN AND PELVIS WITH CONTRAST TECHNIQUE: Multidetector CT imaging of the abdomen and pelvis was performed using the standard protocol following bolus administration of intravenous contrast. RADIATION DOSE REDUCTION: This exam was performed according to the departmental dose-optimization program which includes automated exposure control, adjustment of the mA and/or kV according to patient size and/or use of iterative reconstruction technique. CONTRAST:  OMNIPAQUE IOHEXOL 300 MG/ML  SOLN COMPARISON:  CTs with IV contrast 03/27/2021 and 09/02/2020. FINDINGS: Lower chest: The lung bases are clear of infiltrates, with diffuse bronchial thickening and scattered linear scarring or atelectasis in both bases, chronic mildly elevated right diaphragm. Small hiatal hernia. The cardiac size is normal. There are three-vessel coronary artery calcifications. No pericardial effusion. Hepatobiliary: The liver is 18 cm length mildly steatotic. There is no mass enhancement. Again noted is a rounded 2 cm area of focal periligamentous fat in segment 4 B. Old cholecystectomy. There is interval worsened intrahepatic and extrahepatic biliary dilatation today with the common bile duct now measuring 1.5 cm. There is a noncalcified filling defect in the most distal duct, coronal series 5 image 45 measuring 1.5 x 1.2 cm. Unknown if this is a soft tissue mass or a noncalcified ductal stone. Pancreas: There is so increased pancreatic ductal dilatation, with pancreatic duct measuring 4-5 mm. There is no discrete pancreatic mass enhancement and no inflammatory changes. The gland again noted partially atrophic. Spleen: Lobular and deformed but not enlarged. There are 2 adjacent splenules also unchanged.  There are dystrophic calcifications. This is probably due to old splenic trauma or infection versus infarctions. Adrenals/Urinary Tract: No adrenal mass. Mild cortical thinning noted both kidneys. There is no renal mass enhancement, no urinary stone or obstruction. There is a moderately distended bladder with the dome reaching almost after L4-5 but there is no wall thickening. Stomach/Bowel: Unremarkable stomach. Unremarkable unopacified small bowel. Evidence of prior ileocecectomy with primary anastomosis end to side. There is mild thickening in the colorectal wall and mucosal enhancement, fluid in the colon. Findings most likely due to a mild diffuse proctocolitis. There is no pneumatosis. Vascular/Lymphatic: Aortic atherosclerosis. No enlarged abdominal or pelvic lymph nodes. Reproductive: Prostate is unremarkable. Other: No free fluid, free hemorrhage, free air or incarcerated hernias. Scattered anterior abdominal surgical clips. Musculoskeletal: Degenerative change lumbar spine greatest at L4-5 and L5-S1. No acute or other significant osseous findings. IMPRESSION: 1. Evidence of a mild diffuse proctocolitis. No bowel obstruction, pneumatosis or perforation. Prior ileocecectomy. 2. Interval worsened biliary and pancreatic ductal dilatation, with a 1.5 x 1.2 cm noncalcified filling defect in the most distal common bile duct. Unknown if this is a soft tissue mass or a noncalcified ductal stone. MRCP with pre and  postcontrast images, or ERCP recommended. 3. Moderately distended urinary bladder without wall thickening. 4. Aortic and coronary artery atherosclerosis. 5. Chronic changes in the spleen. 6. Small hiatal hernia. 7. Bronchitis. Aortic Atherosclerosis (ICD10-I70.0). Electronically Signed   By: Almira Bar M.D.   On: 06/04/2023 05:58   CT HEAD WO CONTRAST ( )  Result Date: 06/04/2023 CLINICAL DATA:  Mental status change of unknown cause. Unwell feeling. EXAM: CT HEAD WITHOUT CONTRAST TECHNIQUE:  Contiguous axial images were obtained from the base of the skull through the vertex without intravenous contrast. RADIATION DOSE REDUCTION: This exam was performed according to the departmental dose-optimization program which includes automated exposure control, adjustment of the mA and/or kV according to patient size and/or use of iterative reconstruction technique. COMPARISON:  01/18/2023 FINDINGS: Brain: Mild diffuse cerebral atrophy. Ventricular dilatation consistent with central atrophy. Low-attenuation changes in the deep white matter consistent with small vessel ischemia. Areas of encephalomalacia demonstrated throughout the bilateral frontal regions, left parietal region, and right occipital lobe. These areas are unchanged since prior study and likely represent old infarcts. No mass-effect or midline shift. No abnormal extra-axial fluid collections. Gray-white matter junctions are distinct. Basal cisterns are not effaced. No acute intracranial hemorrhage. Vascular: Ectatic basilar artery. No aneurysm. Mild vascular calcifications. Skull: Normal. Negative for fracture or focal lesion. Sinuses/Orbits: Mucosal thickening in the paranasal sinuses. No acute air-fluid levels. Mastoid air cells are clear. Other: None. IMPRESSION: 1. No acute intracranial abnormalities. 2. Chronic atrophy and small vessel ischemic changes. 3. Multiple old infarcts, unchanged since prior study. Electronically Signed   By: Burman Nieves M.D.   On: 06/04/2023 00:45   DG Chest Portable 1 View  Result Date: 06/03/2023 CLINICAL DATA:  Hypotension and weakness. History of previous strokes. Headache. EXAM: PORTABLE CHEST 1 VIEW COMPARISON:  03/31/2021 FINDINGS: Slightly shallow inspiration. Heart size and pulmonary vascularity are normal. Lungs are clear. No pleural effusions. No pneumothorax. Mediastinal contours appear intact. IMPRESSION: No active disease. Electronically Signed   By: Burman Nieves M.D.   On: 06/03/2023 23:16       IMPRESSION AND PLAN:  Assessment and Plan: * Sepsis due to undetermined organism [facility]) - The patient will be admitted to a medical telemetry bed. - His sepsis is likely secondary to his diffuse proctocolitis. - He will be continued on IV cefepime, vancomycin and Flagyl. - He will be continued on hydration with IV lactated Ringer. - We will follow blood cultures.  Common bile duct stone - The patient will have an MRCP. - GI consultation will be obtained. - I notified Dr. Norma Fredrickson about the patient and Dr. Tobi Bastos is aware. - Will follow LFTs. - Will check lipase level and keep the patient n.p.o. for now except for medications.  Essential hypertension - We will continue antihypertensive therapy.  Dyslipidemia - We will continue statin therapy.  Anxiety and depression - We will continue Celexa.  GERD without esophagitis - We will continue PPI therapy.  BPH (benign prostatic hyperplasia) .  Will continue Flomax.   DVT prophylaxis: Lovenox.  Advanced Care Planning:  Code Status: full code.  Family Communication:  The plan of care was discussed in details with the patient (and family). I answered all questions. The patient agreed to proceed with the above mentioned plan. Further management will depend upon hospital course. Disposition Plan: Back to previous home environment Consults called: GI All the records are reviewed and case discussed with ED provider.  Status is: Inpatient  At the time of the admission, it appears  that the appropriate admission status for this patient is inpatient.  This is judged to be reasonable and necessary in order to provide the required intensity of service to ensure the patient's safety given the presenting symptoms, physical exam findings and initial radiographic and laboratory data in the context of comorbid conditions.  The patient requires inpatient status due to high intensity of service, high risk of further deterioration and high frequency  of surveillance required.  I certify that at the time of admission, it is my clinical judgment that the patient will require inpatient hospital care extending more than 2 midnights.                            Dispo: The patient is from: Home              Anticipated d/c is to: Home              Patient currently is not medically stable to d/c.              Difficult to place patient: No  Hannah Beat M.D on 06/04/2023 at 6:49 AM  [facility]   From 7 PM-7 AM, contact night-coverage [email or web address]  CC: Primary care physician; Marjie Skiff, NP

## 2023-06-05 ENCOUNTER — Inpatient Hospital Stay: Payer: Medicare PPO

## 2023-06-05 ENCOUNTER — Encounter
Admission: EM | Disposition: A | Discharge status: Home / Self Care | Payer: Self-pay | Source: Home / Self Care | Attending: Internal Medicine

## 2023-06-05 ENCOUNTER — Inpatient Hospital Stay: Payer: Medicare PPO | Admitting: Anesthesiology

## 2023-06-05 ENCOUNTER — Encounter: Payer: Self-pay | Admitting: Family Medicine

## 2023-06-05 DIAGNOSIS — A419 Sepsis, unspecified organism: Secondary | ICD-10-CM | POA: Diagnosis not present

## 2023-06-05 DIAGNOSIS — R932 Abnormal findings on diagnostic imaging of liver and biliary tract: Secondary | ICD-10-CM

## 2023-06-05 DIAGNOSIS — K8309 Other cholangitis: Secondary | ICD-10-CM

## 2023-06-05 DIAGNOSIS — K838 Other specified diseases of biliary tract: Secondary | ICD-10-CM

## 2023-06-05 DIAGNOSIS — F419 Anxiety disorder, unspecified: Secondary | ICD-10-CM

## 2023-06-05 DIAGNOSIS — K8050 Calculus of bile duct without cholangitis or cholecystitis without obstruction: Secondary | ICD-10-CM | POA: Diagnosis not present

## 2023-06-05 DIAGNOSIS — F32A Depression, unspecified: Secondary | ICD-10-CM

## 2023-06-05 DIAGNOSIS — K839 Disease of biliary tract, unspecified: Secondary | ICD-10-CM | POA: Diagnosis not present

## 2023-06-05 DIAGNOSIS — G9341 Metabolic encephalopathy: Secondary | ICD-10-CM

## 2023-06-05 DIAGNOSIS — I10 Essential (primary) hypertension: Secondary | ICD-10-CM | POA: Diagnosis not present

## 2023-06-05 HISTORY — PX: ENDOSCOPIC RETROGRADE CHOLANGIOPANCREATOGRAPHY (ERCP) WITH PROPOFOL: SHX5810

## 2023-06-05 HISTORY — PX: SPHINCTEROTOMY: SHX5544

## 2023-06-05 HISTORY — PX: BIOPSY: SHX5522

## 2023-06-05 LAB — COMPREHENSIVE METABOLIC PANEL
ALT: 31 U/L (ref 0–44)
AST: 28 U/L (ref 15–41)
Albumin: 2.7 g/dL — ABNORMAL LOW (ref 3.5–5.0)
Alkaline Phosphatase: 103 U/L (ref 38–126)
Anion gap: 7 (ref 5–15)
BUN: 6 mg/dL — ABNORMAL LOW (ref 8–23)
CO2: 23 mmol/L (ref 22–32)
Calcium: 7.8 mg/dL — ABNORMAL LOW (ref 8.9–10.3)
Chloride: 106 mmol/L (ref 98–111)
Creatinine, Ser: 0.84 mg/dL (ref 0.61–1.24)
GFR, Estimated: >60 mL/min (ref >60)
Glucose, Bld: 101 mg/dL — ABNORMAL HIGH (ref 70–99)
Potassium: 3.8 mmol/L (ref 3.5–5.1)
Sodium: 136 mmol/L (ref 135–145)
Total Bilirubin: 0.8 mg/dL (ref 0.3–1.2)
Total Protein: 5.2 g/dL — ABNORMAL LOW (ref 6.5–8.1)

## 2023-06-05 LAB — CBC
HCT: 37.2 % — ABNORMAL LOW (ref 39.0–52.0)
Hemoglobin: 12.5 g/dL — ABNORMAL LOW (ref 13.0–17.0)
MCH: 31 pg (ref 26.0–34.0)
MCHC: 33.6 g/dL (ref 30.0–36.0)
MCV: 92.3 fL (ref 80.0–100.0)
Platelets: 261 10*3/uL (ref 150–400)
RBC: 4.03 MIL/uL — ABNORMAL LOW (ref 4.22–5.81)
RDW: 13.3 % (ref 11.5–15.5)
WBC: 9.5 10*3/uL (ref 4.0–10.5)
nRBC: 0 % (ref 0.0–0.2)

## 2023-06-05 LAB — LIPASE, BLOOD: Lipase: 23 U/L (ref 11–51)

## 2023-06-05 SURGERY — ENDOSCOPIC RETROGRADE CHOLANGIOPANCREATOGRAPHY (ERCP) WITH PROPOFOL
Anesthesia: General

## 2023-06-05 MED ORDER — DICLOFENAC SUPPOSITORY 100 MG
100.0000 mg | Freq: Once | RECTAL | Status: AC
Start: 2023-06-05 — End: 2023-06-05
  Administered 2023-06-05: 100 mg via RECTAL

## 2023-06-05 MED ORDER — DICLOFENAC SUPPOSITORY 100 MG
RECTAL | Status: AC
  Filled 2023-06-05: qty 1

## 2023-06-05 MED ORDER — LACTATED RINGERS IV SOLN: INTRAVENOUS | Status: DC

## 2023-06-05 MED ORDER — PROPOFOL 500 MG/50ML IV EMUL
INTRAVENOUS | Status: DC | PRN
  Administered 2023-06-05: 145 ug/kg/min via INTRAVENOUS

## 2023-06-05 MED ORDER — HYALURONIDASE HUMAN 150 UNIT/ML IJ SOLN
150.0000 [IU] | Freq: Once | INTRAMUSCULAR | Status: AC
  Administered 2023-06-05: 150 [IU] via SUBCUTANEOUS
  Filled 2023-06-05: qty 1

## 2023-06-05 MED ORDER — SODIUM CHLORIDE 0.9 % IV SOLN
2.0000 g | INTRAVENOUS | Status: DC
  Administered 2023-06-05 – 2023-06-07 (×3): 2 g via INTRAVENOUS
  Filled 2023-06-05 (×4): qty 20

## 2023-06-05 MED ORDER — GADOBUTROL 1 MMOL/ML IV SOLN
7.0000 mL | Freq: Once | INTRAVENOUS | Status: AC | PRN
  Administered 2023-06-05: 7 mL via INTRAVENOUS

## 2023-06-05 NOTE — ED Notes (Signed)
RN went in to check on pt and noticed his Left forearm was very swollen, warm to touch. Reddened in some places. Area to upper forearm near elbow leaking from what appears to be a skin tear or skin rupture. RN notified pharmacy bc pt said his arm did not look like that before. RN aksed if it hurt and he reported it did. Provider also contacted.

## 2023-06-05 NOTE — Op Note (Signed)
[facility] Patient Name: Mark [name] Procedure Date: 06/05/2023 11:57 AM MRN: [id number] Account #: [id number] Date of Birth: 31-Dec-1960 Admit Type: Inpatient Age: 62 Room: [facility] ENDO ROOM 4 Gender: Male Note Status: Finalized Instrument Name: [id number] Procedure:             ERCP Indications:           Biliary dilation on magnetic resonance                         cholangiopancreatography Providers:             Midge Minium MD, MD Referring MD:          No Local Md, MD (Referring MD) Medicines:             Propofol per Anesthesia Complications:         No immediate complications. Procedure:             Pre-Anesthesia Assessment:                        - Prior to the procedure, a History and Physical was                         performed, and patient medications and allergies were                         reviewed. The patient's tolerance of previous                         anesthesia was also reviewed. The risks and benefits                         of the procedure and the sedation options and risks                         were discussed with the patient. All questions were                         answered, and informed consent was obtained. Prior                         Anticoagulants: The patient has taken no anticoagulant                         or antiplatelet agents. ASA Grade Assessment: II - A                         patient with mild systemic disease. After reviewing                         the risks and benefits, the patient was deemed in                         satisfactory condition to undergo the procedure.                        After obtaining informed consent, the scope was passed  under direct vision. Throughout the procedure, the                         patient's blood pressure, pulse, and oxygen                         saturations were monitored continuously. The                          Duodenoscope was introduced through the mouth, and                         used to inject contrast into and used to inject                         contrast into the bile duct. The ERCP was accomplished                         without difficulty. The patient tolerated the                         procedure well. Findings:      A scout film of the abdomen was obtained. Surgical clips, consistent       with a previous cholecystectomy, were seen in the area of the right       upper quadrant of the abdomen. The esophagus was successfully intubated       under direct vision. The scope was advanced to a normal major papilla in       the descending duodenum without detailed examination of the pharynx,       larynx and associated structures, and upper GI tract. The upper GI tract       was grossly normal. The bile duct was deeply cannulated with the       short-nosed traction sphincterotome. Contrast was injected. I personally       interpreted the bile duct images. There was brisk flow of contrast       through the ducts. Image quality was excellent. Contrast extended to the       entire biliary tree. The main bile duct was diffusely dilated. A wire       was passed into the biliary tree. A 6 mm biliary sphincterotomy was made       with a traction (standard) sphincterotome using ERBE electrocautery.       There was no post-sphincterotomy bleeding. The ampulla was biopsied with       a cold biopsy for histology. Impression:            - The entire main bile duct was dilated.                        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                        - Biopsy was performed. Recommendation:        - Return patient to hospital ward for ongoing care.                        -  Clear liquid diet today.                        - Watch for pancreatitis, bleeding, perforation, and                         cholangitis. Procedure Code(s):     ---  Professional ---                        [phone], Endoscopic retrograde cholangiopancreatography                         (ERCP); with sphincterotomy/papillotomy                        [phone], Endoscopic catheterization of the biliary                         ductal system, radiological supervision and                         interpretation Diagnosis Code(s):     --- Professional ---                        K83.9, Disease of biliary tract, unspecified                        K83.8, Other specified diseases of biliary tract CPT copyright 2022 [employer] Association. All rights reserved. The codes documented in this report are preliminary and upon coder review may  be revised to meet current compliance requirements. Midge Minium MD, MD 06/05/2023 12:42:25 PM This report has been signed electronically. Number of Addenda: 0 Note Initiated On: 06/05/2023 11:57 AM Estimated Blood Loss:  Estimated blood loss: none.      [facility]

## 2023-06-05 NOTE — ED Notes (Signed)
Site injected with hylenex and wrapped loosely in ace. Warm compressed applied and pt elevating. Pulses intact and cap refill less than 2 secs and nailbeds do not appear cyanotic.

## 2023-06-05 NOTE — Progress Notes (Signed)
Progress Note   Patient: Mark [name]:[id number] DOB: 1961/04/26 DOA: 06/03/2023     1 DOS: the patient was seen and examined on 06/05/2023   Brief hospital course: Mark [name] is a 62 y.o. male with medical history significant for essential hypertension, CVA, opioid abuse, depression, presented to the emergency room with acute onset of generalized fatigue and weakness and malaise.  He admitted to cough occasionally productive of greenish sputum over the last week with occasional dyspnea without wheezing.  He denied any fever or chills.  He has been having occasional abdominal pain with nausea and vomiting.  When he was hypotensive in triage he was having mild lightheadedness and dizziness.    He was slow to answer questions during my interview and would fall asleep easily.  I discussed with patient's son thinks that his mental status is changed and he is usually not this confused.  Assessment and Plan: * Sepsis due to undetermined organism [facility]) CT abdomen showed diffuse proctocolitis. Low BP, WBC 16 upon presentation. Will taper IV cefepime, vancomycin to Rocephin and Flagyl. Will go down on IV fluids, encourage oral fluids. Follow blood cultures.  Common bile duct obstruction Seen on MRCP- Biliary and pancreatic duct dilatation followed to the level of the ampulla.  GI consultation appreciated. Patient to go for ERCP today Continue to follow LFTs.  Acute metabolic encephalopathy: Possibly due to sepsis presentation. He had CT head which did not show acute etiology.  He has multiple old infarcts. I ordered MRI brain rule out acute stroke. Continue to monitor neurochecks. Avoid opiates, benzos. Delirium precautions.  Essential hypertension Caution with antihypertensives as blood pressure is lower side.  Dyslipidemia Hold statin therapy, monitor LFT.  Anxiety and depression continue Celexa.  GERD without esophagitis Continue PPI therapy.  BPH (benign  prostatic hyperplasia) On Flomax.  Out of bed to chair. Incentive spirometry. Nursing supportive care. Fall, aspiration precautions. DVT prophylaxis   Code Status: Full Code  Subjective: Patient is seen and examined today morning.  He is able to answer me but slow.  Denies any abdominal pain, nausea or vomiting.  He is awaiting ERCP.  Physical Exam: Vitals:   06/05/23 1250 06/05/23 1310 06/05/23 1417 06/05/23 1725  BP: 104/72 119/76 (!) 135/91 122/89  Pulse: 70 80 86 88  Resp: 14  18 18   Temp: (!) 97.2 F (36.2 C) (!) 97.5 F (36.4 C) 97.7 F (36.5 C) (!) 97.4 F (36.3 C)  TempSrc: Temporal Temporal  Oral  SpO2: 99% 99% 98% 98%  Weight:      Height:        General - Elderly Caucasian male, no apparent distress HEENT - PERRLA, EOMI, atraumatic head, non tender sinuses. Lung - Clear, diffuse rales, no rhonchi, wheezes. Heart - S1, S2 heard, no murmurs, rubs, trace pedal edema. Abdomen - Soft, non tender nondistended, bowel sounds good Neuro - Alert, awake, able to answer, no new neuro deficits. Skin - Warm and dry.  Data Reviewed:      Latest Ref Rng & Units 06/05/2023    4:44 AM 06/04/2023    3:34 AM 06/03/2023   10:13 PM  CBC  WBC 4.0 - 10.5 K/uL 9.5  12.6  16.2   Hemoglobin 13.0 - 17.0 g/dL 81.1  91.4  78.2   Hematocrit 39.0 - 52.0 % 37.2  38.3  47.0   Platelets 150 - 400 K/uL 261  223  326       Latest Ref Rng & Units 06/05/2023  4:44 AM 06/04/2023    3:34 AM 06/03/2023   10:13 PM  BMP  Glucose 70 - 99 mg/dL 409  811  914   BUN 8 - 23 mg/dL 6  8  11    Creatinine 0.61 - 1.24 mg/dL 7.82  9.56  2.13   Sodium 135 - 145 mmol/L 136  135  135   Potassium 3.5 - 5.1 mmol/L 3.8  3.5  3.8   Chloride 98 - 111 mmol/L 106  107  99   CO2 22 - 32 mmol/L 23  21  25    Calcium 8.9 - 10.3 mg/dL 7.8  7.8  9.0    DG C-Arm 1-60 Min-No Report  Result Date: 06/05/2023 Fluoroscopy was utilized by the requesting physician.  No radiographic interpretation.   MR ABDOMEN  MRCP W WO CONTAST  Result Date: 06/04/2023 CLINICAL DATA:  Biliary obstruction on CT EXAM: MRI ABDOMEN WITHOUT AND WITH CONTRAST (INCLUDING MRCP) TECHNIQUE: Multiplanar multisequence MR imaging of the abdomen was performed both before and after the administration of intravenous contrast. Heavily T2-weighted images of the biliary and pancreatic ducts were obtained, and three-dimensional MRCP images were rendered by post processing. CONTRAST:  6mL GADAVIST GADOBUTROL 1 MMOL/ML IV SOLN COMPARISON:  CT of earlier today and on 03/27/2021 FINDINGS: Mild to moderate motion degradation throughout. Lower chest: Normal heart size without pericardial or pleural effusion. Hepatobiliary: Anterior segment 4B hepatic cyst of 1.5 cm. Scattered arterial phase tiny foci of hyperenhancement are likely perfusion anomalies. Cholecystectomy. Mild-to-moderate intrahepatic biliary duct dilatation. Mild extrahepatic duct dilatation at 12 mm on 15/3. Followed to the level of the ampulla. Equivocal soft tissue fullness at the ampulla including on 27/4 (12 mm). Pancreas: Mild pancreatic duct dilatation including at 5 mm in the head on 14/16. No dominant pancreatic mass or surrounding inflammation. Spleen: Macrolobulated appearance of the spleen may relate to remote ischemic insult. Adrenals/Urinary Tract: Normal adrenal glands. Tiny nonenhancing renal lesions are likely cysts . In the absence of clinically indicated signs/symptoms require(s) no independent follow-up. No hydronephrosis. Stomach/Bowel: Susceptibility artifact from endoscopy clip within the gastric cardia. Normal abdominal bowel loops. Vascular/Lymphatic: Aortic atherosclerosis. No retroperitoneal or retrocrural adenopathy. Other:  No ascites. Musculoskeletal: No acute osseous abnormality. IMPRESSION: 1. Motion degraded exam. 2. Biliary and pancreatic duct dilatation followed to the level of the ampulla. No obstructive stone. Equivocal soft tissue fullness at the ampulla.  Recommend ERCP to evaluate for ampullary stenosis versus ampullary mass. 3.  Aortic Atherosclerosis (ICD10-I70.0). Electronically Signed   By: Jeronimo Greaves M.D.   On: 06/04/2023 09:17   MR 3D Recon At Scanner  Result Date: 06/04/2023 CLINICAL DATA:  Biliary obstruction on CT EXAM: MRI ABDOMEN WITHOUT AND WITH CONTRAST (INCLUDING MRCP) TECHNIQUE: Multiplanar multisequence MR imaging of the abdomen was performed both before and after the administration of intravenous contrast. Heavily T2-weighted images of the biliary and pancreatic ducts were obtained, and three-dimensional MRCP images were rendered by post processing. CONTRAST:  6mL GADAVIST GADOBUTROL 1 MMOL/ML IV SOLN COMPARISON:  CT of earlier today and on 03/27/2021 FINDINGS: Mild to moderate motion degradation throughout. Lower chest: Normal heart size without pericardial or pleural effusion. Hepatobiliary: Anterior segment 4B hepatic cyst of 1.5 cm. Scattered arterial phase tiny foci of hyperenhancement are likely perfusion anomalies. Cholecystectomy. Mild-to-moderate intrahepatic biliary duct dilatation. Mild extrahepatic duct dilatation at 12 mm on 15/3. Followed to the level of the ampulla. Equivocal soft tissue fullness at the ampulla including on 27/4 (12 mm). Pancreas: Mild pancreatic duct dilatation including at  5 mm in the head on 14/16. No dominant pancreatic mass or surrounding inflammation. Spleen: Macrolobulated appearance of the spleen may relate to remote ischemic insult. Adrenals/Urinary Tract: Normal adrenal glands. Tiny nonenhancing renal lesions are likely cysts . In the absence of clinically indicated signs/symptoms require(s) no independent follow-up. No hydronephrosis. Stomach/Bowel: Susceptibility artifact from endoscopy clip within the gastric cardia. Normal abdominal bowel loops. Vascular/Lymphatic: Aortic atherosclerosis. No retroperitoneal or retrocrural adenopathy. Other:  No ascites. Musculoskeletal: No acute osseous  abnormality. IMPRESSION: 1. Motion degraded exam. 2. Biliary and pancreatic duct dilatation followed to the level of the ampulla. No obstructive stone. Equivocal soft tissue fullness at the ampulla. Recommend ERCP to evaluate for ampullary stenosis versus ampullary mass. 3.  Aortic Atherosclerosis (ICD10-I70.0). Electronically Signed   By: Jeronimo Greaves M.D.   On: 06/04/2023 09:17   CT ABDOMEN PELVIS W CONTRAST  Result Date: 06/04/2023 CLINICAL DATA:  Abdominal pain. EXAM: CT ABDOMEN AND PELVIS WITH CONTRAST TECHNIQUE: Multidetector CT imaging of the abdomen and pelvis was performed using the standard protocol following bolus administration of intravenous contrast. RADIATION DOSE REDUCTION: This exam was performed according to the departmental dose-optimization program which includes automated exposure control, adjustment of the mA and/or kV according to patient size and/or use of iterative reconstruction technique. CONTRAST:  OMNIPAQUE IOHEXOL 300 MG/ML  SOLN COMPARISON:  CTs with IV contrast 03/27/2021 and 09/02/2020. FINDINGS: Lower chest: The lung bases are clear of infiltrates, with diffuse bronchial thickening and scattered linear scarring or atelectasis in both bases, chronic mildly elevated right diaphragm. Small hiatal hernia. The cardiac size is normal. There are three-vessel coronary artery calcifications. No pericardial effusion. Hepatobiliary: The liver is 18 cm length mildly steatotic. There is no mass enhancement. Again noted is a rounded 2 cm area of focal periligamentous fat in segment 4 B. Old cholecystectomy. There is interval worsened intrahepatic and extrahepatic biliary dilatation today with the common bile duct now measuring 1.5 cm. There is a noncalcified filling defect in the most distal duct, coronal series 5 image 45 measuring 1.5 x 1.2 cm. Unknown if this is a soft tissue mass or a noncalcified ductal stone. Pancreas: There is so increased pancreatic ductal dilatation, with  pancreatic duct measuring 4-5 mm. There is no discrete pancreatic mass enhancement and no inflammatory changes. The gland again noted partially atrophic. Spleen: Lobular and deformed but not enlarged. There are 2 adjacent splenules also unchanged. There are dystrophic calcifications. This is probably due to old splenic trauma or infection versus infarctions. Adrenals/Urinary Tract: No adrenal mass. Mild cortical thinning noted both kidneys. There is no renal mass enhancement, no urinary stone or obstruction. There is a moderately distended bladder with the dome reaching almost after L4-5 but there is no wall thickening. Stomach/Bowel: Unremarkable stomach. Unremarkable unopacified small bowel. Evidence of prior ileocecectomy with primary anastomosis end to side. There is mild thickening in the colorectal wall and mucosal enhancement, fluid in the colon. Findings most likely due to a mild diffuse proctocolitis. There is no pneumatosis. Vascular/Lymphatic: Aortic atherosclerosis. No enlarged abdominal or pelvic lymph nodes. Reproductive: Prostate is unremarkable. Other: No free fluid, free hemorrhage, free air or incarcerated hernias. Scattered anterior abdominal surgical clips. Musculoskeletal: Degenerative change lumbar spine greatest at L4-5 and L5-S1. No acute or other significant osseous findings. IMPRESSION: 1. Evidence of a mild diffuse proctocolitis. No bowel obstruction, pneumatosis or perforation. Prior ileocecectomy. 2. Interval worsened biliary and pancreatic ductal dilatation, with a 1.5 x 1.2 cm noncalcified filling defect in the most distal common  bile duct. Unknown if this is a soft tissue mass or a noncalcified ductal stone. MRCP with pre and postcontrast images, or ERCP recommended. 3. Moderately distended urinary bladder without wall thickening. 4. Aortic and coronary artery atherosclerosis. 5. Chronic changes in the spleen. 6. Small hiatal hernia. 7. Bronchitis. Aortic Atherosclerosis  (ICD10-I70.0). Electronically Signed   By: Almira Bar M.D.   On: 06/04/2023 05:58   CT HEAD WO CONTRAST ( )  Result Date: 06/04/2023 CLINICAL DATA:  Mental status change of unknown cause. Unwell feeling. EXAM: CT HEAD WITHOUT CONTRAST TECHNIQUE: Contiguous axial images were obtained from the base of the skull through the vertex without intravenous contrast. RADIATION DOSE REDUCTION: This exam was performed according to the departmental dose-optimization program which includes automated exposure control, adjustment of the mA and/or kV according to patient size and/or use of iterative reconstruction technique. COMPARISON:  01/18/2023 FINDINGS: Brain: Mild diffuse cerebral atrophy. Ventricular dilatation consistent with central atrophy. Low-attenuation changes in the deep white matter consistent with small vessel ischemia. Areas of encephalomalacia demonstrated throughout the bilateral frontal regions, left parietal region, and right occipital lobe. These areas are unchanged since prior study and likely represent old infarcts. No mass-effect or midline shift. No abnormal extra-axial fluid collections. Gray-white matter junctions are distinct. Basal cisterns are not effaced. No acute intracranial hemorrhage. Vascular: Ectatic basilar artery. No aneurysm. Mild vascular calcifications. Skull: Normal. Negative for fracture or focal lesion. Sinuses/Orbits: Mucosal thickening in the paranasal sinuses. No acute air-fluid levels. Mastoid air cells are clear. Other: None. IMPRESSION: 1. No acute intracranial abnormalities. 2. Chronic atrophy and small vessel ischemic changes. 3. Multiple old infarcts, unchanged since prior study. Electronically Signed   By: Burman Nieves M.D.   On: 06/04/2023 00:45   DG Chest Portable 1 View  Result Date: 06/03/2023 CLINICAL DATA:  Hypotension and weakness. History of previous strokes. Headache. EXAM: PORTABLE CHEST 1 VIEW COMPARISON:  03/31/2021 FINDINGS: Slightly shallow  inspiration. Heart size and pulmonary vascularity are normal. Lungs are clear. No pleural effusions. No pneumothorax. Mediastinal contours appear intact. IMPRESSION: No active disease. Electronically Signed   By: Burman Nieves M.D.   On: 06/03/2023 23:16     Family Communication: Discussed with patient's son over phone. Updated current plan, he is worried about patient mental status. Asks for some one to call him with acute changes and MRI brain results. All questions answereed.    Disposition: Status is: Inpatient Remains inpatient appropriate because: change in mental status, GI work up.  Planned Discharge Destination: Home with Home Health     Time spent: 42 minutes  Author: Marcelino Duster, MD 06/05/2023 6:01 PM Secure chat 7am to 7pm For on call review [email or web address].

## 2023-06-05 NOTE — Plan of Care (Signed)
  Problem: Clinical Measurements: Goal: Diagnostic test results will improve Outcome: Progressing Goal: Signs and symptoms of infection will decrease Outcome: Progressing   Problem: Pain Management: Goal: General experience of comfort will improve Outcome: Progressing

## 2023-06-05 NOTE — ED Notes (Signed)
Pt to Endo for procedure. 

## 2023-06-05 NOTE — Plan of Care (Signed)
  Problem: Fluid Volume: Goal: Hemodynamic stability will improve Outcome: Progressing   Problem: Clinical Measurements: Goal: Diagnostic test results will improve Outcome: Progressing Goal: Signs and symptoms of infection will decrease Outcome: Progressing   Problem: Respiratory: Goal: Ability to maintain adequate ventilation will improve Outcome: Progressing   Problem: Education: Goal: Knowledge of General Education information will improve Description: Including pain rating scale, medication(s)/side effects and non-pharmacologic comfort measures Outcome: Progressing   Problem: Clinical Measurements: Goal: Ability to maintain clinical measurements within normal limits will improve Outcome: Progressing Goal: Will remain free from infection Outcome: Progressing Goal: Diagnostic test results will improve Outcome: Progressing Goal: Respiratory complications will improve Outcome: Progressing Goal: Cardiovascular complication will be avoided Outcome: Progressing   Problem: Nutrition: Goal: Adequate nutrition will be maintained Outcome: Progressing   Problem: Activity: Goal: Risk for activity intolerance will decrease Outcome: Progressing   Problem: Coping: Goal: Level of anxiety will decrease Outcome: Progressing   Problem: Elimination: Goal: Will not experience complications related to bowel motility Outcome: Progressing Goal: Will not experience complications related to urinary retention Outcome: Progressing   Problem: Safety: Goal: Ability to remain free from injury will improve Outcome: Progressing   Problem: Pain Management: Goal: General experience of comfort will improve Outcome: Progressing   Problem: Skin Integrity: Goal: Risk for impaired skin integrity will decrease Outcome: Progressing

## 2023-06-05 NOTE — Transfer of Care (Signed)
Immediate Anesthesia Transfer of Care Note  Patient: Mark [name]  Procedure(s) Performed: ENDOSCOPIC RETROGRADE CHOLANGIOPANCREATOGRAPHY (ERCP) WITH PROPOFOL  Patient Location: PACU  Anesthesia Type:General  Level of Consciousness: awake and alert   Airway & Oxygen Therapy: Patient Spontanous Breathing and Patient connected to nasal cannula oxygen  Post-op Assessment: Report given to RN and Post -op Vital signs reviewed and stable  Post vital signs: Reviewed and stable  Last Vitals:  Vitals Value Taken Time  BP 108/71 06/05/23 1242  Temp 36.2 C 06/05/23 1240  Pulse 79 06/05/23 1244  Resp 17 06/05/23 1244  SpO2 99 % 06/05/23 1244  Vitals shown include unfiled device data.  Last Pain:  Vitals:   06/05/23 1240  TempSrc: Temporal  PainSc: Asleep      Patients Stated Pain Goal: 0 (06/05/23 1107)  Complications: No notable events documented.

## 2023-06-05 NOTE — Anesthesia Preprocedure Evaluation (Signed)
Anesthesia Evaluation  Patient identified by MRN, date of birth, ID band Patient awake and Patient confused  General Assessment Comment:   Patient presented with lethargy. Denies vomiting or nausea. Notes mentioned some trouble breathing, however CXR was clear.  Patient awake, AO x 3, however seems very confused about his medical situation. Does not know which procedure , or even what part of the body, is being worked on today. Patient does say that we are allowed to discuss his care with his ex wife Lynd Nation (his adult son, who is the son of the ex-wife as well, is unable to be reached)  Ex wife Trayden Reveal number: [phone]  Reviewed: Allergy & Precautions, NPO status , Patient's Chart, lab work & pertinent test results  History of Anesthesia Complications Negative for: history of anesthetic complications  Airway Mallampati: II  TM Distance: >3 FB Neck ROM: Full    Dental  (+) Poor Dentition, Missing   Pulmonary neg sleep apnea, COPD, Current Smoker and Patient abstained from smoking.   Pulmonary exam normal breath sounds clear to auscultation       Cardiovascular Exercise Tolerance: Poor METShypertension, Pt. on medications (-) CAD and (-) Past MI (-) dysrhythmias  Rhythm:Regular Rate:Normal - Systolic murmurs    Neuro/Psych  PSYCHIATRIC DISORDERS Anxiety Depression    CVA    GI/Hepatic ,GERD  ,,(+)     (-) substance abuse    Endo/Other  neg diabetes    Renal/GU negative Renal ROS     Musculoskeletal   Abdominal   Peds  Hematology   Anesthesia Other Findings Past Medical History: No date: Depression No date: History of kidney stones No date: Hypertension No date: Stroke [facility])  Reproductive/Obstetrics                             Anesthesia Physical Anesthesia Plan  ASA: 3  Anesthesia Plan: General   Post-op Pain Management: Minimal or no pain anticipated    Induction: Intravenous  PONV Risk Score and Plan: 2 and Propofol infusion, TIVA and Ondansetron  Airway Management Planned: Nasal Cannula  Additional Equipment: None  Intra-op Plan:   Post-operative Plan:   Informed Consent: I have reviewed the patients History and Physical, chart, labs and discussed the procedure including the risks, benefits and alternatives for the proposed anesthesia with the patient or authorized representative who has indicated his/her understanding and acceptance.     Dental advisory given and Consent reviewed with POA  Plan Discussed with: CRNA and Surgeon  Anesthesia Plan Comments: (Discussed risks of anesthesia with patient and patient's ex wife Putney Nation (patient gave me permission to call her and discuss his care), including possibility of difficulty with spontaneous ventilation under anesthesia necessitating airway intervention, PONV, and rare risks such as cardiac or respiratory or neurological events, and allergic reactions. Discussed the role of CRNA in patient's perioperative care. Patient and wife understands.)       Anesthesia Quick Evaluation

## 2023-06-06 ENCOUNTER — Encounter: Payer: Self-pay | Admitting: Gastroenterology

## 2023-06-06 DIAGNOSIS — E785 Hyperlipidemia, unspecified: Secondary | ICD-10-CM | POA: Diagnosis not present

## 2023-06-06 DIAGNOSIS — I10 Essential (primary) hypertension: Secondary | ICD-10-CM | POA: Diagnosis not present

## 2023-06-06 DIAGNOSIS — A419 Sepsis, unspecified organism: Secondary | ICD-10-CM | POA: Diagnosis not present

## 2023-06-06 DIAGNOSIS — K8050 Calculus of bile duct without cholangitis or cholecystitis without obstruction: Secondary | ICD-10-CM | POA: Diagnosis not present

## 2023-06-06 LAB — COMPREHENSIVE METABOLIC PANEL
ALT: 25 U/L (ref 0–44)
AST: 17 U/L (ref 15–41)
Albumin: 2.7 g/dL — ABNORMAL LOW (ref 3.5–5.0)
Alkaline Phosphatase: 98 U/L (ref 38–126)
Anion gap: 8 (ref 5–15)
BUN: <5 mg/dL — ABNORMAL LOW (ref 8–23)
CO2: 25 mmol/L (ref 22–32)
Calcium: 7.9 mg/dL — ABNORMAL LOW (ref 8.9–10.3)
Chloride: 104 mmol/L (ref 98–111)
Creatinine, Ser: 0.8 mg/dL (ref 0.61–1.24)
GFR, Estimated: >60 mL/min (ref >60)
Glucose, Bld: 97 mg/dL (ref 70–99)
Potassium: 3.1 mmol/L — ABNORMAL LOW (ref 3.5–5.1)
Sodium: 137 mmol/L (ref 135–145)
Total Bilirubin: 0.7 mg/dL (ref 0.3–1.2)
Total Protein: 5.2 g/dL — ABNORMAL LOW (ref 6.5–8.1)

## 2023-06-06 LAB — LIPASE, BLOOD: Lipase: 20 U/L (ref 11–51)

## 2023-06-06 LAB — CBC
HCT: 39.6 % (ref 39.0–52.0)
Hemoglobin: 13.4 g/dL (ref 13.0–17.0)
MCH: 31.1 pg (ref 26.0–34.0)
MCHC: 33.8 g/dL (ref 30.0–36.0)
MCV: 91.9 fL (ref 80.0–100.0)
Platelets: 285 10*3/uL (ref 150–400)
RBC: 4.31 MIL/uL (ref 4.22–5.81)
RDW: 13 % (ref 11.5–15.5)
WBC: 11 10*3/uL — ABNORMAL HIGH (ref 4.0–10.5)
nRBC: 0 % (ref 0.0–0.2)

## 2023-06-06 LAB — SURGICAL PATHOLOGY

## 2023-06-06 LAB — PHOSPHORUS: Phosphorus: 3.1 mg/dL (ref 2.5–4.6)

## 2023-06-06 LAB — MAGNESIUM: Magnesium: 1.8 mg/dL (ref 1.7–2.4)

## 2023-06-06 MED ORDER — ASPIRIN 325 MG PO TABS
325.0000 mg | ORAL_TABLET | Freq: Every day | ORAL | Status: DC
  Administered 2023-06-06 – 2023-06-07 (×2): 325 mg via ORAL
  Filled 2023-06-06 (×2): qty 1

## 2023-06-06 MED ORDER — HYDROCODONE-ACETAMINOPHEN 5-325 MG PO TABS
1.0000 | ORAL_TABLET | Freq: Four times a day (QID) | ORAL | Status: DC | PRN
  Administered 2023-06-06 – 2023-06-07 (×4): 1 via ORAL
  Filled 2023-06-06 (×5): qty 1

## 2023-06-06 MED ORDER — POTASSIUM CHLORIDE CRYS ER 20 MEQ PO TBCR
40.0000 meq | EXTENDED_RELEASE_TABLET | Freq: Once | ORAL | Status: AC
  Administered 2023-06-06: 40 meq via ORAL
  Filled 2023-06-06: qty 2

## 2023-06-06 MED ORDER — HYDROCODONE-ACETAMINOPHEN 5-325 MG PO TABS
1.0000 | ORAL_TABLET | ORAL | Status: DC | PRN
  Administered 2023-06-06 (×2): 1 via ORAL
  Filled 2023-06-06 (×2): qty 1

## 2023-06-06 MED ORDER — MORPHINE SULFATE (PF) 2 MG/ML IV SOLN
1.0000 mg | INTRAVENOUS | Status: DC | PRN
  Administered 2023-06-06 – 2023-06-07 (×4): 1 mg via INTRAVENOUS
  Filled 2023-06-06 (×4): qty 1

## 2023-06-06 MED ORDER — ATORVASTATIN CALCIUM 20 MG PO TABS
40.0000 mg | ORAL_TABLET | Freq: Every day | ORAL | Status: DC
  Administered 2023-06-06 – 2023-06-08 (×3): 40 mg via ORAL
  Filled 2023-06-06 (×3): qty 2

## 2023-06-06 NOTE — Plan of Care (Signed)

## 2023-06-06 NOTE — Progress Notes (Signed)
PHARMACY CONSULT NOTE - FOLLOW UP  Pharmacy Consult for Electrolyte Monitoring and Replacement   Recent Labs: Potassium (mmol/L)  Date Value  06/06/2023 3.1 (L)  09/21/2011 4.3   Magnesium (mg/dL)  Date Value  16/05/9603 2.0  09/12/2011 2.1   Calcium (mg/dL)  Date Value  54/04/8118 7.9 (L)   Calcium, Total (mg/dL)  Date Value  14/78/2956 9.1   Albumin (g/dL)  Date Value  21/30/8657 2.7 (L)  01/24/2023 4.5  09/21/2011 3.9   Phosphorus (mg/dL)  Date Value  84/69/6295 2.6   Sodium (mmol/L)  Date Value  06/06/2023 137  01/24/2023 138  09/21/2011 140     Assessment: 10/23:  K @ 0429 = 3.1   Goal of Therapy:  Electrolytes WNL   Plan:  - Will order KCl 40 mEq PO X 1 - Will order Phos and Mag as Add-On to previous collection  Gaelen Brager D ,PharmD Clinical Pharmacist 06/06/2023 6:34 AM

## 2023-06-06 NOTE — Progress Notes (Addendum)
Progress Note   Patient: Mark [name] EAV:[id number] DOB: September 21, 1960 DOA: 06/03/2023     2 DOS: the patient was seen and examined on 06/06/2023   Brief hospital course: Mark [name] is a 62 y.o. male with medical history significant for essential hypertension, CVA, opioid abuse, depression, presented to the emergency room with acute onset of generalized fatigue and weakness and malaise.  He admitted to cough occasionally productive of greenish sputum over the last week with occasional dyspnea without wheezing.  He denied any fever or chills.  He has been having occasional abdominal pain with nausea and vomiting.  When he was hypotensive in triage he was having mild lightheadedness and dizziness.    10/22: He was slow to answer questions during my interview and would fall asleep easily.  I discussed with patient's son thinks that his mental status is changed and he is usually not this confused.  10/23: MRI shows small focus of acute/early subacute cortical ischemia within the left frontal lobe. Will add asa, statin and get neuro c/s in am tomorrow  Assessment and Plan: * Sepsis Ruled out Common bile duct obstruction Seen on MRCP- Biliary and pancreatic duct dilatation followed to the level of the ampulla.  S/p ERCP on 10/22 - The entire main bile duct was dilated. 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                      - Biopsy was performed. Monitor LFTs. CT abdomen showed diffuse proctocolitis. Low BP, WBC 16 upon presentation. - continue IV Rocephin and Flagyl. encourage oral fluids. No growth on blood cultures.  Acute metabolic encephalopathy: Possibly due to sepsis presentation but can't r/o CVA He had CT head which did not show acute etiology.  He has multiple old infarcts. MRI brain shows small focus of acute/early subacute cortical ischemia within the left frontal lobe. Will add asa, statin and get neuro c/s in am tomorrow. Dr Selina Cooley aware. Not  sure how much of this is related to the MRI findiings Continue to monitor neurochecks. Avoid opiates, benzos - cutting back on the dose of Morphine  and Norco. Delirium precautions.  Essential hypertension Caution with antihypertensives as blood pressure is lower side.  Dyslipidemia Hold statin therapy, monitor LFT.  Anxiety and depression continue Celexa.  GERD without esophagitis Continue PPI therapy.  BPH (benign prostatic hyperplasia) On Flomax.     Code Status: Full Code  Subjective: He is able to answer me but remains slow. He reports his pain as 8/10 around umbilical area  Physical Exam: Vitals:   06/05/23 1725 06/05/23 2022 06/06/23 0322 06/06/23 0752  BP: 122/89 119/81 116/78 117/84  Pulse: 88 71 81 83  Resp: 18 17  18   Temp: (!) 97.4 F (36.3 C) 98.5 F (36.9 C) (!) 97.5 F (36.4 C) (!) 97.5 F (36.4 C)  TempSrc: Oral Oral    SpO2: 98% 97% 100% 98%  Weight:      Height:        General - Elderly Caucasian male, no apparent distress HEENT - PERRLA, EOMI, atraumatic head, non tender sinuses. Lung - Clear, diffuse rales, no rhonchi, wheezes. Heart - S1, S2 heard, no murmurs, rubs, trace pedal edema. Abdomen - Soft, mild tender around periumbilical area. nondistended, bowel sounds good Neuro - Alert, awake, able to answer but is slow to response, no new neuro deficits. Skin - Warm and dry.  Data Reviewed:      Latest Ref  Rng & Units 06/06/2023    4:29 AM 06/05/2023    4:44 AM 06/04/2023    3:34 AM  CBC  WBC 4.0 - 10.5 K/uL 11.0  9.5  12.6   Hemoglobin 13.0 - 17.0 g/dL 86.5  78.4  69.6   Hematocrit 39.0 - 52.0 % 39.6  37.2  38.3   Platelets 150 - 400 K/uL 285  261  223       Latest Ref Rng & Units 06/06/2023    4:29 AM 06/05/2023    4:44 AM 06/04/2023    3:34 AM  BMP  Glucose 70 - 99 mg/dL 97  295  284   BUN 8 - 23 mg/dL 5  6  8    Creatinine 0.61 - 1.24 mg/dL 1.32  4.40  1.02   Sodium 135 - 145 mmol/L 137  136  135   Potassium 3.5 - 5.1  mmol/L 3.1  3.8  3.5   Chloride 98 - 111 mmol/L 104  106  107   CO2 22 - 32 mmol/L 25  23  21    Calcium 8.9 - 10.3 mg/dL 7.9  7.8  7.8    MR BRAIN W WO CONTRAST  Result Date: 06/06/2023 CLINICAL DATA:  Altered mental status EXAM: MRI HEAD WITHOUT AND WITH CONTRAST TECHNIQUE: Multiplanar, multiecho pulse sequences of the brain and surrounding structures were obtained without and with intravenous contrast. CONTRAST:  7mL GADAVIST GADOBUTROL 1 MMOL/ML IV SOLN COMPARISON:  07/04/2017 FINDINGS: Brain: Small focus of acute/early subacute ischemia within the left frontal cortex (5:27). No other diffusion abnormality. No acute hemorrhage. There is confluent hyperintense T2-weighted signal within the white matter. Generalized volume loss. Multiple old cerebellar infarcts and old infarcts of the right occipital, bilateral parietal and bilateral frontal lobes. The midline structures are normal. There is no abnormal contrast enhancement. Vascular: Normal flow voids. Skull and upper cervical spine: Normal calvarium and skull base. Visualized upper cervical spine and soft tissues are normal. Sinuses/Orbits:No paranasal sinus fluid levels or advanced mucosal thickening. No mastoid or middle ear effusion. Normal orbits. IMPRESSION: 1. Small focus of acute/early subacute cortical ischemia within the left frontal lobe. No hemorrhage or mass effect. 2. 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 Electronically Signed   By: Deatra Robinson M.D.   On: 06/06/2023 04:01   DG C-Arm 1-60 Min-No Report  Result Date: 06/05/2023 Fluoroscopy was utilized by the requesting physician.  No radiographic interpretation.     Family Communication: None at bedside    Disposition: Status is: Inpatient Remains inpatient appropriate because: change in mental status, GI & Neuro work up.  Planned Discharge Destination: Home with Home Health     Time spent: 32  minutes  Author: Delfino Lovett, MD 06/06/2023 5:13 PM Secure chat 7am to 7pm For on call review [email or web address].

## 2023-06-06 NOTE — Anesthesia Postprocedure Evaluation (Signed)
Anesthesia Post Note  Patient: Mark [name]  Procedure(s) Performed: ENDOSCOPIC RETROGRADE CHOLANGIOPANCREATOGRAPHY (ERCP) WITH PROPOFOL BIOPSY SPHINCTEROTOMY  Patient location during evaluation: Endoscopy Anesthesia Type: General Level of consciousness: awake and alert Pain management: pain level controlled Vital Signs Assessment: post-procedure vital signs reviewed and stable Respiratory status: spontaneous breathing, nonlabored ventilation, respiratory function stable and patient connected to nasal cannula oxygen Cardiovascular status: blood pressure returned to baseline and stable Postop Assessment: no apparent nausea or vomiting Anesthetic complications: no   No notable events documented.   Last Vitals:  Vitals:   06/06/23 0322 06/06/23 0752  BP: 116/78 117/84  Pulse: 81 83  Resp:  18  Temp: (!) 36.4 C (!) 36.4 C  SpO2: 100% 98%    Last Pain:  Vitals:   06/06/23 1440  TempSrc:   PainSc: 9                  Corinda Gubler

## 2023-06-06 NOTE — TOC CM/SW Note (Signed)
Transition of Care [facility]) - Inpatient Brief Assessment   Patient Details  Name: Mark [name] MRN: [id number] Date of Birth: 06/01/1961  Transition of Care [facility]) CM/SW Contact:    Chapman Fitch, RN Phone Number: 06/06/2023, 2:40 PM   Clinical Narrative:   Transition of Care [facility]) Screening Note   Patient Details  Name: Mark [name] Date of Birth: 1960/11/11   Transition of Care [facility]) CM/SW Contact:    Chapman Fitch, RN Phone Number: 06/06/2023, 2:40 PM    Transition of Care Department [facility]) has reviewed patient and no TOC needs have been identified at this time. . If new patient transition needs arise, please place a TOC consult.    Transition of Care Asessment: Insurance and Status: Insurance coverage has been reviewed Patient has primary care physician: Yes     Prior/Current Home Services: No current home services Social Determinants of Health Reivew: SDOH reviewed no interventions necessary Readmission risk has been reviewed: Yes Transition of care needs: no transition of care needs at this time

## 2023-06-07 ENCOUNTER — Inpatient Hospital Stay: Payer: Medicare PPO

## 2023-06-07 ENCOUNTER — Ambulatory Visit: Payer: Medicare PPO

## 2023-06-07 DIAGNOSIS — R410 Disorientation, unspecified: Secondary | ICD-10-CM | POA: Diagnosis not present

## 2023-06-07 DIAGNOSIS — I959 Hypotension, unspecified: Secondary | ICD-10-CM

## 2023-06-07 DIAGNOSIS — E785 Hyperlipidemia, unspecified: Secondary | ICD-10-CM | POA: Diagnosis not present

## 2023-06-07 DIAGNOSIS — I10 Essential (primary) hypertension: Secondary | ICD-10-CM | POA: Diagnosis not present

## 2023-06-07 DIAGNOSIS — D72829 Elevated white blood cell count, unspecified: Secondary | ICD-10-CM | POA: Diagnosis not present

## 2023-06-07 DIAGNOSIS — I639 Cerebral infarction, unspecified: Secondary | ICD-10-CM | POA: Diagnosis not present

## 2023-06-07 DIAGNOSIS — K8050 Calculus of bile duct without cholangitis or cholecystitis without obstruction: Secondary | ICD-10-CM | POA: Diagnosis not present

## 2023-06-07 LAB — COMPREHENSIVE METABOLIC PANEL
ALT: 19 U/L (ref 0–44)
AST: 13 U/L — ABNORMAL LOW (ref 15–41)
Albumin: 2.8 g/dL — ABNORMAL LOW (ref 3.5–5.0)
Alkaline Phosphatase: 85 U/L (ref 38–126)
Anion gap: 8 (ref 5–15)
BUN: 5 mg/dL — ABNORMAL LOW (ref 8–23)
CO2: 24 mmol/L (ref 22–32)
Calcium: 8 mg/dL — ABNORMAL LOW (ref 8.9–10.3)
Chloride: 108 mmol/L (ref 98–111)
Creatinine, Ser: 0.81 mg/dL (ref 0.61–1.24)
GFR, Estimated: >60 mL/min (ref >60)
Glucose, Bld: 102 mg/dL — ABNORMAL HIGH (ref 70–99)
Potassium: 3.5 mmol/L (ref 3.5–5.1)
Sodium: 140 mmol/L (ref 135–145)
Total Bilirubin: 0.7 mg/dL (ref 0.3–1.2)
Total Protein: 5.2 g/dL — ABNORMAL LOW (ref 6.5–8.1)

## 2023-06-07 LAB — CBC
HCT: 37.6 % — ABNORMAL LOW (ref 39.0–52.0)
Hemoglobin: 13 g/dL (ref 13.0–17.0)
MCH: 31 pg (ref 26.0–34.0)
MCHC: 34.6 g/dL (ref 30.0–36.0)
MCV: 89.7 fL (ref 80.0–100.0)
Platelets: 330 10*3/uL (ref 150–400)
RBC: 4.19 MIL/uL — ABNORMAL LOW (ref 4.22–5.81)
RDW: 13.2 % (ref 11.5–15.5)
WBC: 7.9 10*3/uL (ref 4.0–10.5)
nRBC: 0 % (ref 0.0–0.2)

## 2023-06-07 LAB — HEMOGLOBIN A1C
Hgb A1c MFr Bld: 5.6 % (ref 4.8–5.6)
Mean Plasma Glucose: 114.02 mg/dL

## 2023-06-07 LAB — MAGNESIUM: Magnesium: 1.7 mg/dL (ref 1.7–2.4)

## 2023-06-07 LAB — LIPASE, BLOOD: Lipase: 22 U/L (ref 11–51)

## 2023-06-07 MED ORDER — CLOPIDOGREL BISULFATE 75 MG PO TABS
75.0000 mg | ORAL_TABLET | Freq: Every day | ORAL | Status: DC
  Administered 2023-06-07 – 2023-06-08 (×2): 75 mg via ORAL
  Filled 2023-06-07 (×2): qty 1

## 2023-06-07 MED ORDER — HYDROCODONE-ACETAMINOPHEN 5-325 MG PO TABS
1.0000 | ORAL_TABLET | Freq: Four times a day (QID) | ORAL | Status: DC | PRN
  Administered 2023-06-07 – 2023-06-08 (×3): 1 via ORAL
  Filled 2023-06-07 (×3): qty 1

## 2023-06-07 MED ORDER — MAGNESIUM SULFATE 2 GM/50ML IV SOLN
2.0000 g | Freq: Once | INTRAVENOUS | Status: AC
  Administered 2023-06-07: 2 g via INTRAVENOUS
  Filled 2023-06-07: qty 50

## 2023-06-07 MED ORDER — IOHEXOL 350 MG/ML SOLN
75.0000 mL | Freq: Once | INTRAVENOUS | Status: AC | PRN
  Administered 2023-06-07: 75 mL via INTRAVENOUS

## 2023-06-07 MED ORDER — GABAPENTIN 300 MG PO CAPS
300.0000 mg | ORAL_CAPSULE | Freq: Three times a day (TID) | ORAL | Status: DC
Start: 2023-06-07 — End: 2023-06-08
  Administered 2023-06-07 – 2023-06-08 (×4): 300 mg via ORAL
  Filled 2023-06-07 (×4): qty 1

## 2023-06-07 MED ORDER — ASPIRIN 81 MG PO TBEC
81.0000 mg | DELAYED_RELEASE_TABLET | Freq: Every day | ORAL | Status: DC
Start: 2023-06-08 — End: 2023-06-08
  Administered 2023-06-08: 81 mg via ORAL
  Filled 2023-06-07: qty 1

## 2023-06-07 NOTE — Consult Note (Signed)
NEUROLOGY CONSULTATION NOTE   Date of service: June 07, 2023 Patient Name: Mark [name] MRN:  [id number] DOB:  1961-03-07 Reason for consult: acute ischemic stroke Requesting physician: Dr. Delfino Lovett _ _ _   _ __   _ __ _ _  __ __   _ __   __ _  History of Present Illness   This is a 62 year old gentleman with past medical history significant for hypertension, remote CVA, opioid abuse, depression who presented to the ED with generalized fatigue, weakness, malaise, productive cough, and occasional dyspnea he has not had any fever or chills.  He also had some abdominal pain with nausea and vomiting.  He was hypotensive in triage and having mild lightheadedness and dizziness.  He was initially thought to be septic but this was ruled out.  He was noted on MRCP to have a common bile duct obstruction.  He continues on Rocephin and Flagyl.  The day before yesterday he was slower than usual to answer questions and more lethargic.  Son agreed that his mental status was worse than baseline.  MRI brain performed yesterday showed a small focus of acute/early subacute cortical ischemia within the left frontal lobe.  Aspirin and statin were started and neurology was consulted for further guidance.  On my examination this afternoon he is fully oriented and reports he feels back to baseline.   ROS   Per HPI: all other systems reviewed and are negative  Past History   I have reviewed the following:  Past Medical History:  Diagnosis Date   Depression    History of kidney stones    Hypertension    Stroke [facility])    Past Surgical History:  Procedure Laterality Date   APPENDECTOMY     BIOPSY  06/05/2023   Procedure: BIOPSY;  Surgeon: Midge Minium, MD;  Location: [facility] ENDOSCOPY;  Service: Endoscopy;;   bowel obstruction     COLON SURGERY     COLONOSCOPY N/A 09/07/2020   Procedure: COLONOSCOPY;  Surgeon: Pasty Spillers, MD;  Location: [facility] ENDOSCOPY;  Service: Endoscopy;  Laterality: N/A;    ENDOSCOPIC RETROGRADE CHOLANGIOPANCREATOGRAPHY (ERCP) WITH PROPOFOL N/A 06/05/2023   Procedure: ENDOSCOPIC RETROGRADE CHOLANGIOPANCREATOGRAPHY (ERCP) WITH PROPOFOL;  Surgeon: Midge Minium, MD;  Location: [facility] ENDOSCOPY;  Service: Endoscopy;  Laterality: N/A;   ESOPHAGOGASTRODUODENOSCOPY (EGD) WITH PROPOFOL N/A 09/03/2020   Procedure: ESOPHAGOGASTRODUODENOSCOPY (EGD) WITH PROPOFOL;  Surgeon: Toney Reil, MD;  Location: [facility] ENDOSCOPY;  Service: Gastroenterology;  Laterality: N/A;   EXTRACORPOREAL SHOCK WAVE LITHOTRIPSY Right 05/15/2019   Procedure: EXTRACORPOREAL SHOCK WAVE LITHOTRIPSY (ESWL);  Surgeon: Malen Gauze, MD;  Location: [facility] ORS;  Service: Urology;  Laterality: Right;   SPHINCTEROTOMY  06/05/2023   Procedure: SPHINCTEROTOMY;  Surgeon: Midge Minium, MD;  Location: [facility] ENDOSCOPY;  Service: Endoscopy;;   Family History  Problem Relation Age of Onset   Heart disease Father    Seizures Brother    Social History   Socioeconomic History   Marital status: Divorced    Spouse name: Not on file   Number of children: Not on file   Years of education: Not on file   Highest education level: Not on file  Occupational History   Occupation: retired  Tobacco Use   Smoking status: Every Day    Current packs/day: 1.00    Average packs/day: 1 pack/day for 43.0 years (43.0 ttl pk-yrs)    Types: Cigarettes   Smokeless tobacco: Never  Vaping Use   Vaping status: Never Used  Substance  and Sexual Activity   Alcohol use: Not Currently    Alcohol/week: 0.0 standard drinks of alcohol   Drug use: Not Currently   Sexual activity: Not Currently  Other Topics Concern   Not on file  Social History Narrative   Not on file   Social Determinants of Health   Financial Resource Strain: Low Risk  (07/25/2022)   Overall Financial Resource Strain (CARDIA)    Difficulty of Paying Living Expenses: Not hard at all  Food Insecurity: No Food Insecurity (06/05/2023)   Hunger Vital Sign     Worried About Running Out of Food in the Last Year: Never true    Ran Out of Food in the Last Year: Never true  Transportation Needs: No Transportation Needs (06/05/2023)   PRAPARE - Administrator, Civil Service (Medical): No    Lack of Transportation (Non-Medical): No  Physical Activity: Sufficiently Active (07/25/2022)   Exercise Vital Sign    Days of Exercise per Week: 4 days    Minutes of Exercise per Session: 40 min  Stress: No Stress Concern Present (07/25/2022)   [employer] of Occupational Health - Occupational Stress Questionnaire    Feeling of Stress : Only a little  Social Connections: Socially Isolated (07/25/2022)   Social Connection and Isolation Panel [NHANES]    Frequency of Communication with Friends and Family: More than three times a week    Frequency of Social Gatherings with Friends and Family: More than three times a week    Attends Religious Services: Never    Database administrator or Organizations: No    Attends Banker Meetings: Never    Marital Status: Never married   Allergies  Allergen Reactions   Codeine Hives    Medications   Medications Prior to Admission  Medication Sig Dispense Refill Last Dose   amLODipine (NORVASC) 10 MG tablet Take 1 tablet (10 mg total) by mouth daily. 90 tablet 4 Past Week   citalopram (CELEXA) 20 MG tablet Take 1 tablet (20 mg total) by mouth daily. 90 tablet 4 Past Week   [employer] D3 25 MCG (1000 UT) capsule TAKE 2 CAPSULES (2,000 UNITS TOTAL) BY MOUTH DAILY. 300 capsule 2 Past Week   cyanocobalamin 1000 MCG tablet Take 1 tablet (1,000 mcg total) by mouth daily. 90 tablet 4 Past Week   gabapentin (NEURONTIN) 300 MG capsule TAKE 1 CAPSULE BY MOUTH EVERYDAY AT BEDTIME 90 capsule 1 Past Week   Melatonin 10 MG TABS Take 10 mg by mouth at bedtime. 90 tablet 4 Past Week   metoprolol tartrate (LOPRESSOR) 25 MG tablet Take 1 tablet (25 mg total) by mouth 2 (two) times daily. 180 tablet 4 Past Week    omeprazole (PRILOSEC) 20 MG capsule TAKE 1 CAPSULE BY MOUTH EVERY DAY 90 capsule 3 Past Week   pravastatin (PRAVACHOL) 20 MG tablet Take 1 tablet (20 mg total) by mouth daily. 90 tablet 4 Past Week   tamsulosin (FLOMAX) 0.4 MG CAPS capsule Take 1 capsule (0.4 mg total) by mouth daily. 90 capsule 4 Past Week      Current Facility-Administered Medications:    acetaminophen (TYLENOL) tablet 650 mg, 650 mg, Oral, Q6H PRN, 650 mg at 06/04/23 0332 **OR** acetaminophen (TYLENOL) suppository 650 mg, 650 mg, Rectal, Q6H PRN, Servando Snare, Darren, MD   aspirin tablet 325 mg, 325 mg, Oral, Daily, Sherryll Burger, Vipul, MD, 325 mg at 06/07/23 0930   atorvastatin (LIPITOR) tablet 40 mg, 40 mg, Oral, Daily, Delfino Lovett, MD,  40 mg at 06/07/23 2841   cefTRIAXone (ROCEPHIN) 2 g in sodium chloride 0.9 % 100 mL IVPB, 2 g, Intravenous, Q24H, Sreeram, Narendranath, MD, Last Rate: 200 mL/hr at 06/06/23 2139, 2 g at 06/06/23 2139   cholecalciferol (VITAMIN D3) tablet 2,000 Units, 2,000 Units, Oral, Daily, Midge Minium, MD, 2,000 Units at 06/07/23 3244   citalopram (CELEXA) tablet 20 mg, 20 mg, Oral, Daily, Servando Snare, Darren, MD, 20 mg at 06/07/23 0102   cyanocobalamin (VITAMIN B12) tablet 1,000 mcg, 1,000 mcg, Oral, Daily, Servando Snare, Darren, MD, 1,000 mcg at 06/07/23 0928   enoxaparin (LOVENOX) injection 40 mg, 40 mg, Subcutaneous, Q24H, Servando Snare, Darren, MD, 40 mg at 06/07/23 0932   gabapentin (NEURONTIN) capsule 300 mg, 300 mg, Oral, TID, Delfino Lovett, MD   HYDROcodone-acetaminophen (NORCO/VICODIN) 5-325 MG per tablet 1 tablet, 1 tablet, Oral, Q6H PRN, Delfino Lovett, MD, 1 tablet at 06/07/23 7253   magnesium hydroxide (MILK OF MAGNESIA) suspension 30 mL, 30 mL, Oral, Daily PRN, Midge Minium, MD   melatonin tablet 10 mg, 10 mg, Oral, QHS, Wohl, Darren, MD, 10 mg at 06/06/23 2034   metoprolol tartrate (LOPRESSOR) tablet 25 mg, 25 mg, Oral, BID, Servando Snare, Darren, MD, 25 mg at 06/07/23 6644   metroNIDAZOLE (FLAGYL) IVPB 500 mg, 500 mg, Intravenous, Q12H,  Midge Minium, MD, Last Rate: 100 mL/hr at 06/07/23 0017, 500 mg at 06/07/23 0017   morphine (PF) 2 MG/ML injection 1 mg, 1 mg, Intravenous, Q4H PRN, Delfino Lovett, MD, 1 mg at 06/07/23 0639   ondansetron (ZOFRAN) tablet 4 mg, 4 mg, Oral, Q6H PRN **OR** ondansetron (ZOFRAN) injection 4 mg, 4 mg, Intravenous, Q6H PRN, Servando Snare, Darren, MD, 4 mg at 06/04/23 1414   pantoprazole (PROTONIX) EC tablet 40 mg, 40 mg, Oral, Daily, Servando Snare, Darren, MD, 40 mg at 06/07/23 0347   tamsulosin (FLOMAX) capsule 0.4 mg, 0.4 mg, Oral, Daily, Servando Snare, Darren, MD, 0.4 mg at 06/07/23 4259   traZODone (DESYREL) tablet 25 mg, 25 mg, Oral, QHS PRN, Midge Minium, MD  Vitals   Vitals:   06/06/23 0752 06/06/23 1914 06/07/23 0350 06/07/23 0900  BP: 117/84 114/79 113/80 (!) 140/95  Pulse: 83 63 60 77  Resp: 18 16 18    Temp: (!) 97.5 F (36.4 C) 98.1 F (36.7 C) 98.4 F (36.9 C)   TempSrc:  Oral Oral   SpO2: 98% 96% 96%   Weight:      Height:         Body mass index is 21.85 kg/m.  Physical Exam   Gen: patient lying in bed, NAD CV: extremities appear well-perfused Resp: normal WOB  Neurologic exam MS: alert, oriented x4, follows commands Speech: no dysarthria, no aphasia CN: PERRL, VFF, EOMI, sensation intact, face symmetric, hearing intact to voice Motor: 5/5 strength throughout Sensory: SILT Reflexes: 2+ symm with toes down bilat Coordination: FNF intact bilat Gait: deferred  NIHSS = 0  Premorbid mRS = 1   Labs   CBC:  Recent Labs  Lab 06/03/23 2213 06/04/23 0334 06/06/23 0429 06/07/23 0500  WBC 16.2*   < > 11.0* 7.9  NEUTROABS 10.0*  --   --   --   HGB 15.5   < > 13.4 13.0  HCT 47.0   < > 39.6 37.6*  MCV 93.1   < > 91.9 89.7  PLT 326   < > 285 330   < > = values in this interval not displayed.    Basic Metabolic Panel:  Lab Results  Component Value Date  NA 140 06/07/2023   K 3.5 06/07/2023   CO2 24 06/07/2023   GLUCOSE 102 (H) 06/07/2023   BUN 5 (L) 06/07/2023   CREATININE 0.81  06/07/2023   CALCIUM 8.0 (L) 06/07/2023   GFRNONAA >60 06/07/2023   GFRAA 82 09/15/2020   Lipid Panel:  Lab Results  Component Value Date   LDLCALC 37 01/24/2023   HgbA1c:  Lab Results  Component Value Date   HGBA1C 5.6 07/25/2022   Urine Drug Screen:     Component Value Date/Time   LABOPIA NONE DETECTED 06/04/2023 0206   COCAINSCRNUR NONE DETECTED 06/04/2023 0206   LABBENZ NONE DETECTED 06/04/2023 0206   AMPHETMU NONE DETECTED 06/04/2023 0206   THCU NONE DETECTED 06/04/2023 0206   LABBARB NONE DETECTED 06/04/2023 0206    Alcohol Level     Component Value Date/Time   ETH <10 06/03/2023 2352    MRI Brain  1. Small focus of acute/early subacute cortical ischemia within the left frontal lobe. No hemorrhage or mass effect. 2. 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  CNS imaging personally reviewed I agree with radiology interpretation   Impression   This is a 62 year old gentleman with past medical history significant for hypertension, remote CVA, opioid abuse, depression admitted for generalized weakness, malaise, cough, abdominal pain, n/v. On 10/22 he became confused and lethargic. MRI brain revealed acute infarct L frontal lobe. He has significantly improved and is now fully oriented. Patient states he feels back to baseline.  Recommendations   - He is >48 hrs out from symptom onset therefore no indication for permissive HTN; avoid hypotension - CTA H&N - TTE with bubble - Check A1c and LDL. Will start atorvastatin 40mg  daily in the meantime, may adjust if indicated after LDL results. - ASA 81mg  daily. Add plavix for 21 days if patient has no further GI procedures planned - rEEG for persistent encephalopathy - q4 hr neuro checks - STAT head CT for any change in neuro exam - Tele - PT/OT/SLP - Stroke education - Amb referral to neurology upon discharge  Will continue to  follow ______________________________________________________________________   Thank you for the opportunity to take part in the care of this patient. If you have any further questions, please contact the neurology consultation attending.  Signed,  Bing Neighbors, MD [facility] [phone]  If 7pm- 7am, please page neurology on call as listed in AMION.  **Any copied and pasted documentation in this note was written by me in another application not billed for and pasted by me into this document.

## 2023-06-07 NOTE — Progress Notes (Signed)
PHARMACY CONSULT NOTE - FOLLOW UP  Pharmacy Consult for Electrolyte Monitoring and Replacement   Recent Labs: Potassium (mmol/L)  Date Value  06/07/2023 3.5  09/21/2011 4.3   Magnesium (mg/dL)  Date Value  40/98/1191 1.7  09/12/2011 2.1   Calcium (mg/dL)  Date Value  47/82/9562 8.0 (L)   Calcium, Total (mg/dL)  Date Value  13/03/6577 9.1   Albumin (g/dL)  Date Value  46/96/2952 2.8 (L)  01/24/2023 4.5  09/21/2011 3.9   Phosphorus (mg/dL)  Date Value  84/13/2440 3.1   Sodium (mmol/L)  Date Value  06/07/2023 140  01/24/2023 138  09/21/2011 140     Assessment:  62 y.o. male w/ PMH of HTN, CVA, opioid abuse, depression, presented to the emergency room with acute onset of generalized fatigue and weakness and malaise. Pharmacy is asked to follow and replace electrolytes  Goal of Therapy:  Electrolytes WNL   Plan:  --2 grams IV magnesium sulfate x 1 --recheck electrolytes in am 10/25  Lowella Bandy ,PharmD Clinical Pharmacist 06/07/2023 7:06 AM

## 2023-06-07 NOTE — Evaluation (Signed)
Physical Therapy Evaluation Patient Details Name: NIEGEL ZEHNER MRN: [id number] DOB: [birth year] Today's Date: 06/07/2023  History of Present Illness  62 y.o. male with PMHx of essential hypertension, CVA, GERD, COPD, opioid abuse, depression, presented to ED with acute onset of generalized fatigue and weakness and malaise. MRI brain: Small focus of acute/early subacute cortical ischemia within the left frontal lobe and multiple old infarcts of the right occipital, bilateral parietal and bilateral frontal lobes. ERCP done on 10/22.   Clinical Impression  Co-treat with OT. Pt received in bed and agreed to PT session. Pt reports pain to be on a scale of 9/10 within his abdominal region. Pain tends to come and go. Bed mobility supine>sit independently. Pt performs STS from EOB independently and does not report any s/sx relative to dizziness. Pt amb in hall ~238ft CGA without AD, and back to room to complete the session. Regarding communication, pt slow processing when asked a question and demonstrated impulsiveness when answering questions, as he would say one thing then would change his answer afterwards. Pt tolerated Tx well today. Based on hx and performance throughout session, pt demonstrates to be close to baseline. Skilled PT sessions are not needed at this time. Please refer if anything changes.        If plan is discharge home, recommend the following: Help with stairs or ramp for entrance   Can travel by private vehicle        Equipment Recommendations None recommended by PT  Recommendations for Other Services       Functional Status Assessment Patient has had a recent decline in their functional status and/or demonstrates limited ability to make significant improvements in function in a reasonable and predictable amount of time     Precautions / Restrictions Precautions Precautions: None      Mobility  Bed Mobility Overal bed mobility: Independent              General bed mobility comments: Pt performed bed mobility without assistance.    Transfers Overall transfer level: Independent Equipment used: None               General transfer comment: Pt performed STS at EOB without the use of AD    Ambulation/Gait Ambulation/Gait assistance: Contact guard assist Gait Distance (Feet): 200 Feet Assistive device: None Gait Pattern/deviations: Step-through pattern Gait velocity: WNL     General Gait Details: Pt performed amb CGA without the use of AD. Pt at times will drift to right when amb, however no stumbles/falls. Pt appears to be close to baseline.  Stairs            Wheelchair Mobility     Tilt Bed    Modified Rankin (Stroke Patients Only)       Balance Overall balance assessment: Independent                                           Pertinent Vitals/Pain Pain Assessment Pain Assessment: 0-10 Pain Score: 9  Pain Location: Abdomen Pain Intervention(s): Monitored during session    Home Living Family/patient expects to be discharged to:: Private residence Living Arrangements: Children (son lives with him, but pt reports he is moving in with his brother) Available Help at Discharge: Family Type of Home: House Home Access: Stairs to enter   Secretary/administrator of Steps: 4   Home Layout: One level Home Equipment:  None Additional Comments: Pt lives with his son, however following d/c he will be moving in with his brother.    Prior Function Prior Level of Function : Independent/Modified Independent             Mobility Comments: no AD use and IND with mobility; on disability, reports no outdoor work ADLs Comments: IND with ADLs with no AD use; son lives with him and he is about to move in with his brother     Extremity/Trunk Assessment   Upper Extremity Assessment Upper Extremity Assessment: Overall WFL for tasks assessed    Lower Extremity Assessment Lower Extremity  Assessment: Overall WFL for tasks assessed       Communication   Communication Communication:  (increased time to get out what he wants to say, will answer then change his mind) Cueing Techniques: Verbal cues  Cognition Arousal: Alert Behavior During Therapy: WFL for tasks assessed/performed Overall Cognitive Status: Within Functional Limits for tasks assessed                                 General Comments: Pt pleasant and willing to participate in PT session. Pt presents with impulsive behavior where he is quick to answer a question and then changes it after.        General Comments General comments (skin integrity, edema, etc.): pt appears at his baseline and able to ambulate into hallway and in room with no AD use and SUP    Exercises     Assessment/Plan    PT Assessment Patient does not need any further PT services  PT Problem List         PT Treatment Interventions      PT Goals (Current goals can be found in the Care Plan section)  Acute Rehab PT Goals Patient Stated Goal: To go home PT Goal Formulation: With patient Time For Goal Achievement: 06/21/23 Potential to Achieve Goals: Good    Frequency       Co-evaluation               AM-PAC PT "6 Clicks" Mobility  Outcome Measure Help needed turning from your back to your side while in a flat bed without using bedrails?: None Help needed moving from lying on your back to sitting on the side of a flat bed without using bedrails?: None Help needed moving to and from a bed to a chair (including a wheelchair)?: None Help needed standing up from a chair using your arms (e.g., wheelchair or bedside chair)?: None Help needed to walk in hospital room?: None Help needed climbing 3-5 steps with a railing? : A Little 6 Click Score: 23    End of Session Equipment Utilized During Treatment: Gait belt Activity Tolerance: Patient tolerated treatment well Patient left: Other (comment) (At sink with  OT as this was a Co-treat session.) Nurse Communication: Mobility status PT Visit Diagnosis: Unsteadiness on feet (R26.81);Pain    Time: 0957-1010 PT Time Calculation (min) (ACUTE ONLY): 13 min   Charges:   PT Evaluation $PT Eval Low Complexity: 1 Low   PT General Charges $$ ACUTE PT VISIT: 1 Visit         Rebel Willcutt Sauvignon Howard SPT, LAT, ATC   Eutimio Gharibian Sauvignon-Howard 06/07/2023, 1:57 PM

## 2023-06-07 NOTE — Evaluation (Signed)
Clinical/Bedside Swallow Evaluation Patient Details  Name: Mark [name] MRN: [id number] Date of Birth: Dec 16, 1960  Today's Date: 06/07/2023 Time: SLP Start Time (ACUTE ONLY): 1420 SLP Stop Time (ACUTE ONLY): 1512 SLP Time Calculation (min) (ACUTE ONLY): 52 min  Past Medical History:  Past Medical History:  Diagnosis Date   Depression    History of kidney stones    Hypertension    Stroke [facility])    Past Surgical History:  Past Surgical History:  Procedure Laterality Date   APPENDECTOMY     BIOPSY  06/05/2023   Procedure: BIOPSY;  Surgeon: Midge Minium, MD;  Location: [facility] ENDOSCOPY;  Service: Endoscopy;;   bowel obstruction     COLON SURGERY     COLONOSCOPY N/A 09/07/2020   Procedure: COLONOSCOPY;  Surgeon: Pasty Spillers, MD;  Location: [facility] ENDOSCOPY;  Service: Endoscopy;  Laterality: N/A;   ENDOSCOPIC RETROGRADE CHOLANGIOPANCREATOGRAPHY (ERCP) WITH PROPOFOL N/A 06/05/2023   Procedure: ENDOSCOPIC RETROGRADE CHOLANGIOPANCREATOGRAPHY (ERCP) WITH PROPOFOL;  Surgeon: Midge Minium, MD;  Location: [facility] ENDOSCOPY;  Service: Endoscopy;  Laterality: N/A;   ESOPHAGOGASTRODUODENOSCOPY (EGD) WITH PROPOFOL N/A 09/03/2020   Procedure: ESOPHAGOGASTRODUODENOSCOPY (EGD) WITH PROPOFOL;  Surgeon: Toney Reil, MD;  Location: [facility] ENDOSCOPY;  Service: Gastroenterology;  Laterality: N/A;   EXTRACORPOREAL SHOCK WAVE LITHOTRIPSY Right 05/15/2019   Procedure: EXTRACORPOREAL SHOCK WAVE LITHOTRIPSY (ESWL);  Surgeon: Malen Gauze, MD;  Location: [facility] ORS;  Service: Urology;  Laterality: Right;   SPHINCTEROTOMY  06/05/2023   Procedure: SPHINCTEROTOMY;  Surgeon: Midge Minium, MD;  Location: [facility] ENDOSCOPY;  Service: Endoscopy;;   HPI:  Per admitting H&P "Mark [name] is a 62 y.o. male with medical history significant for essential hypertension, CVA, opioid abuse, depression, presented to the emergency room with acute onset of generalized fatigue and weakness and malaise.  He  admitted to cough occasionally productive of greenish sputum over the last week with occasional dyspnea without wheezing.  He denied any fever or chills.  He has been having occasional abdominal pain with nausea and vomiting.  When he was hypotensive in triage he was having mild lightheadedness and dizziness.  No paresthesias or focal muscle weakness.  No dysuria, oliguria or hematuria or flank pain.  He was slow to answer questions during my interview and would fall asleep easily.    Assessment / Plan / Recommendation  Clinical Impression   Pt was pleasant and alert for bedside swallow eval and informal speech and language assessment. Oral mech exam revealed structures to be functioning adequately. Pt tolerated all consistencies without overt s/s of aspiration. Vocal quality remained clear laryngeal elevation appeared adequate. Pt needed extended time to chew solids secondary to missing dentition. Also noted to be impulsive needing cues to slow down and take smaller bites. Diet upgraded to Dys 3 after consulting with MD. Noted mild Dysarthria but speech mostly intelligible. Informal screening revealed deficits with cognition and receptive language. Pt had difficultywith simple yes no questions and 2 step directions. Discussion with nursing upon completion of session revealed that family reports Pt's cognition has improved since yesterday and is now back to baseline from previous strokes. Pt plans to live with his brother after discharge from [facility]. No further ST f/u at this time.  SLP Visit Diagnosis: Dysphagia, oral phase (R13.11)    Aspiration Risk  Mild aspiration risk    Diet Recommendation Dysphagia 3 (Mech soft)    Medication Administration: Whole meds with liquid Compensations: Slow rate Postural Changes: Seated upright at 90 degrees    Other  Recommendations   No speech needed at D/C   Recommendations for follow up therapy are one component of a multi-disciplinary discharge planning  process, led by the attending physician.  Recommendations may be updated based on patient status, additional functional criteria and insurance authorization.  Follow up Recommendations   No ST needed     Assistance Recommended at Discharge  Supervision  Functional Status Assessment    Frequency and Duration     N/A       Prognosis   Good     Swallow Study   General Date of Onset: 06/03/23 HPI: Per admitting H&P "Mark [name] is a 62 y.o. male with medical history significant for essential hypertension, CVA, opioid abuse, depression, presented to the emergency room with acute onset of generalized fatigue and weakness and malaise.  He admitted to cough occasionally productive of greenish sputum over the last week with occasional dyspnea without wheezing.  He denied any fever or chills.  He has been having occasional abdominal pain with nausea and vomiting.  When he was hypotensive in triage he was having mild lightheadedness and dizziness.  No paresthesias or focal muscle weakness.  No dysuria, oliguria or hematuria or flank pain.  He was slow to answer questions during my interview and would fall asleep easily. Type of Study: Bedside Swallow Evaluation Diet Prior to this Study: Clear liquid diet Respiratory Status: Room air History of Recent Intubation: No Behavior/Cognition: Alert;Cooperative;Pleasant mood Oral Cavity Assessment: Within Functional Limits Oral Cavity - Dentition: Missing dentition Vision: Functional for self-feeding Self-Feeding Abilities: Able to feed self Patient Positioning: Upright in bed Baseline Vocal Quality: Normal Volitional Cough: Strong Volitional Swallow: Able to elicit    Oral/Motor/Sensory Function Overall Oral Motor/Sensory Function: Mild impairment Facial ROM: Within Functional Limits Facial Symmetry: Within Functional Limits Facial Strength: Within Functional Limits Facial Sensation: Within Functional Limits Lingual ROM: Within Functional  Limits Lingual Symmetry: Within Functional Limits Lingual Strength: Within Functional Limits Lingual Sensation: Within Functional Limits Velum: Within Functional Limits Mandible: Within Functional Limits   Ice Chips Ice chips: Not tested   Thin Liquid Thin Liquid: Within functional limits Presentation: Cup;Straw    Nectar Thick Nectar Thick Liquid: Not tested   Honey Thick Honey Thick Liquid: Not tested   Puree Puree: Within functional limits   Solid     Solid: Impaired Presentation: Self Fed Oral Phase Impairments:  (slow but adequate)      Eather Colas 06/07/2023,3:14 PM

## 2023-06-07 NOTE — Evaluation (Signed)
Occupational Therapy Evaluation Patient Details Name: Mark [name] MRN: [id number] DOB: 1961-05-21 Today's Date: 06/07/2023   History of Present Illness 62 y.o. male with PMHx of essential hypertension, CVA, GERD, COPD, opioid abuse, depression, presented to ED with acute onset of generalized fatigue and weakness and malaise. MRI brain: Small focus of acute/early subacute cortical ischemia within the left frontal lobe and multiple old infarcts of the right occipital, bilateral parietal and bilateral frontal lobes. ERCP done on 10/22.   Clinical Impression   Pt was seen for OT evaluation this date. Prior to hospital admission, pt was living at home with his son. He reports he is in the process of moving in with his brother. He was on disability, but was IND with all mobility and ADLs with no AD use. 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  Pt presents to acute OT demonstrating no functional decline/impairments during today's eval. He does report intermittent abdominal pain up to 9/10 at times. Pt continually stating he is ready to go home. He was IND with bed mobility, STS from EOB and able to stand at sink to perform oral hygiene and face washing tasks with distant SUP and no AD use. He ambulated ~160 feet with PT/OT in the hallway with no issues and appears at his baseline. OT to sign off in house with no OT follow up needed.      If plan is discharge home, recommend the following: Supervision due to cognitive status;Direct supervision/assist for medications management;Direct supervision/assist for financial management;Assist for transportation;Help with stairs or ramp for entrance    Functional Status Assessment  Patient has not had a recent decline in their functional status  Equipment Recommendations  None recommended by OT    Recommendations for Other Services       Precautions / Restrictions Precautions Precautions: None      Mobility Bed Mobility Overal bed  mobility: Independent                  Transfers Overall transfer level: Independent Equipment used: None               General transfer comment: IND with bed mobility and STS transfers from EOB, able to stand at sink and perform ADL tasks with no LOB and SUP      Balance Overall balance assessment: Independent                                         ADL either performed or assessed with clinical judgement   ADL Overall ADL's : At baseline     Grooming: Wash/dry face;Oral care Grooming Details (indicate cue type and reason): standing at sink                             Functional mobility during ADLs: Modified independent       Vision         Perception         Praxis         Pertinent Vitals/Pain Pain Assessment Pain Assessment: 0-10 Pain Score: 8  Pain Location: abdomen, comes and goes Pain Intervention(s): Monitored during session     Extremity/Trunk Assessment             Communication Communication Communication:  (increased time to get out what he wants to say, will answer  then change his mind)   Cognition Arousal: Alert Behavior During Therapy: WFL for tasks assessed/performed Overall Cognitive Status: Within Functional Limits for tasks assessed                                 General Comments: alert and oriented to person, place, time, situation; impulsive, quick to answer questions then change his mind     General Comments  pt appears at his baseline and able to ambulate into hallway and in room with no AD use and SUP    Exercises     Shoulder Instructions      Home Living Family/patient expects to be discharged to:: Private residence Living Arrangements: Children (son lives with him, but pt reports he is moving in with his brother) Available Help at Discharge: Family Type of Home: House Home Access: Stairs to enter Secretary/administrator of Steps: 4   Home Layout: One  level     Bathroom Shower/Tub: Chief Strategy Officer: Standard     Home Equipment: None          Prior Functioning/Environment Prior Level of Function : Independent/Modified Independent             Mobility Comments: no AD use and IND with mobility; on disability, reports no outdoor work ADLs Comments: IND with ADLs with no AD use; son lives with him and he is about to move in with his brother        OT Problem List: Pain      OT Treatment/Interventions:      OT Goals(Current goals can be found in the care plan section)    OT Frequency:      Co-evaluation              AM-PAC OT "6 Clicks" Daily Activity     Outcome Measure Help from another person eating meals?: None Help from another person taking care of personal grooming?: None Help from another person toileting, which includes using toliet, bedpan, or urinal?: None Help from another person bathing (including washing, rinsing, drying)?: None Help from another person to put on and taking off regular upper body clothing?: None Help from another person to put on and taking off regular lower body clothing?: None 6 Click Score: 24   End of Session Equipment Utilized During Treatment: Gait belt Nurse Communication: Mobility status  Activity Tolerance: Patient tolerated treatment well Patient left: in bed;with call bell/phone within reach;with bed alarm set  OT Visit Diagnosis: Muscle weakness (generalized) (M62.81)                Time: 1610-9604 OT Time Calculation (min): 23 min Charges:  OT General Charges $OT Visit: 1 Visit OT Evaluation $OT Eval Low Complexity: 1 Low OT Treatments $Self Care/Home Management : 8-22 mins Chi Woodham, OTR/L 06/07/23, 11:59 AM Constance Goltz 06/07/2023, 11:57 AM

## 2023-06-07 NOTE — Progress Notes (Signed)
Progress Note   Patient: Mark [name] XNA:[id number] DOB: [birth year] DOA: 06/03/2023     3 DOS: the patient was seen and examined on 06/07/2023   Brief hospital course: Mark [name] is a 62 y.o. male with medical history significant for essential hypertension, CVA, opioid abuse, depression, presented to the emergency room with acute onset of generalized fatigue and weakness and malaise.  He admitted to cough occasionally productive of greenish sputum over the last week with occasional dyspnea without wheezing.  He denied any fever or chills.  He has been having occasional abdominal pain with nausea and vomiting.  When he was hypotensive in triage he was having mild lightheadedness and dizziness.    10/22: He was slow to answer questions during my interview and would fall asleep easily.  I discussed with patient's son thinks that his mental status is changed and he is usually not this confused.  10/23: MRI shows small focus of acute/early subacute cortical ischemia within the left frontal lobe. Will add asa, statin and get neuro c/s in am tomorrow  10/24: Neuro c/s - stroke work up in progress  Assessment and Plan: * Sepsis Ruled out Common bile duct obstruction Seen on MRCP- Biliary and pancreatic duct dilatation followed to the level of the ampulla.  S/p ERCP on 10/22 - The entire main bile duct was dilated. 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                      - Biopsy was performed. Monitor LFTs. CT abdomen showed diffuse proctocolitis. Low BP, WBC 16 upon presentation. - continue IV Rocephin and Flagyl. encourage oral fluids. No growth on blood cultures.  Acute metabolic encephalopathy: Could be due to acute/early subacute stroke seen on MRI He had CT head which did not show acute etiology.  He has multiple old infarcts. MRI brain shows small focus of acute/early subacute cortical ischemia within the left frontal lobe.  - Continue asa, plavix  and statin  ASA 81mg  daily. And plavix for 21 days  per neuro - Seen by neuro Dr Selina Cooley - further stroke work up in progress Continue to monitor neurochecks. Avoid opiates, benzos - stopping morphine, continue prn norco Delirium precautions.  Essential hypertension Caution with antihypertensives as blood pressure is lower side. Avoid hypotension  Dyslipidemia Continue lipitor  Anxiety and depression continue Celexa.  GERD without esophagitis Continue PPI therapy.  BPH (benign prostatic hyperplasia) On Flomax.     Code Status: Full Code  Subjective: He seems close to baseline mental status. Wants to go home but defers to his son  Physical Exam: Vitals:   06/06/23 0752 06/06/23 1914 06/07/23 0350 06/07/23 0900  BP: 117/84 114/79 113/80 (!) 140/95  Pulse: 83 63 60 77  Resp: 18 16 18    Temp: (!) 97.5 F (36.4 C) 98.1 F (36.7 C) 98.4 F (36.9 C)   TempSrc:  Oral Oral   SpO2: 98% 96% 96%   Weight:      Height:        General - Elderly Caucasian male, no apparent distress HEENT - PERRLA, EOMI, atraumatic head, non tender sinuses. Lung - Clear, diffuse rales, no rhonchi, wheezes. Heart - S1, S2 heard, no murmurs, rubs, trace pedal edema. Abdomen - Soft, mild tender around periumbilical area. nondistended, bowel sounds good Neuro - Alert, awake, a no new neuro deficits. Skin - Warm and dry.  Data Reviewed:      Latest Ref Rng &  Units 06/07/2023    5:00 AM 06/06/2023    4:29 AM 06/05/2023    4:44 AM  CBC  WBC 4.0 - 10.5 K/uL 7.9  11.0  9.5   Hemoglobin 13.0 - 17.0 g/dL 16.1  09.6  04.5   Hematocrit 39.0 - 52.0 % 37.6  39.6  37.2   Platelets 150 - 400 K/uL 330  285  261       Latest Ref Rng & Units 06/07/2023    5:00 AM 06/06/2023    4:29 AM 06/05/2023    4:44 AM  BMP  Glucose 70 - 99 mg/dL 409  97  811   BUN 8 - 23 mg/dL 5  <5  6   Creatinine 9.14 - 1.24 mg/dL 7.82  9.56  2.13   Sodium 135 - 145 mmol/L 140  137  136   Potassium 3.5 - 5.1 mmol/L 3.5   3.1  3.8   Chloride 98 - 111 mmol/L 108  104  106   CO2 22 - 32 mmol/L 24  25  23    Calcium 8.9 - 10.3 mg/dL 8.0  7.9  7.8    MR BRAIN W WO CONTRAST  Result Date: 06/06/2023 CLINICAL DATA:  Altered mental status EXAM: MRI HEAD WITHOUT AND WITH CONTRAST TECHNIQUE: Multiplanar, multiecho pulse sequences of the brain and surrounding structures were obtained without and with intravenous contrast. CONTRAST:  7mL GADAVIST GADOBUTROL 1 MMOL/ML IV SOLN COMPARISON:  07/04/2017 FINDINGS: Brain: Small focus of acute/early subacute ischemia within the left frontal cortex (5:27). No other diffusion abnormality. No acute hemorrhage. There is confluent hyperintense T2-weighted signal within the white matter. Generalized volume loss. Multiple old cerebellar infarcts and old infarcts of the right occipital, bilateral parietal and bilateral frontal lobes. The midline structures are normal. There is no abnormal contrast enhancement. Vascular: Normal flow voids. Skull and upper cervical spine: Normal calvarium and skull base. Visualized upper cervical spine and soft tissues are normal. Sinuses/Orbits:No paranasal sinus fluid levels or advanced mucosal thickening. No mastoid or middle ear effusion. Normal orbits. IMPRESSION: 1. Small focus of acute/early subacute cortical ischemia within the left frontal lobe. No hemorrhage or mass effect. 2. 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 Electronically Signed   By: Deatra Robinson M.D.   On: 06/06/2023 04:01     Family Communication: None at bedside    Disposition: Status is: Inpatient Remains inpatient appropriate because: change in mental status, GI & Neuro work up.  Planned Discharge Destination: Home with Home Health     Time spent: 35 minutes  Author: Delfino Lovett, MD 06/07/2023 4:27 PM Secure chat 7am to 7pm For on call review [email or web address].

## 2023-06-08 ENCOUNTER — Inpatient Hospital Stay (HOSPITAL_COMMUNITY)
Admit: 2023-06-08 | Discharge: 2023-06-08 | Disposition: A | Discharge status: Home / Self Care | Payer: Medicare PPO | Attending: Neurology | Admitting: Neurology

## 2023-06-08 ENCOUNTER — Other Ambulatory Visit: Payer: Self-pay | Admitting: *Deleted

## 2023-06-08 ENCOUNTER — Ambulatory Visit: Payer: Medicare PPO

## 2023-06-08 DIAGNOSIS — R569 Unspecified convulsions: Secondary | ICD-10-CM | POA: Diagnosis not present

## 2023-06-08 DIAGNOSIS — I6389 Other cerebral infarction: Secondary | ICD-10-CM

## 2023-06-08 DIAGNOSIS — F419 Anxiety disorder, unspecified: Secondary | ICD-10-CM | POA: Diagnosis not present

## 2023-06-08 DIAGNOSIS — K8050 Calculus of bile duct without cholangitis or cholecystitis without obstruction: Secondary | ICD-10-CM | POA: Diagnosis not present

## 2023-06-08 DIAGNOSIS — G9341 Metabolic encephalopathy: Secondary | ICD-10-CM | POA: Diagnosis not present

## 2023-06-08 DIAGNOSIS — R4182 Altered mental status, unspecified: Secondary | ICD-10-CM | POA: Diagnosis not present

## 2023-06-08 DIAGNOSIS — K219 Gastro-esophageal reflux disease without esophagitis: Secondary | ICD-10-CM

## 2023-06-08 DIAGNOSIS — I10 Essential (primary) hypertension: Secondary | ICD-10-CM | POA: Diagnosis not present

## 2023-06-08 DIAGNOSIS — A419 Sepsis, unspecified organism: Secondary | ICD-10-CM

## 2023-06-08 DIAGNOSIS — I9589 Other hypotension: Secondary | ICD-10-CM

## 2023-06-08 LAB — ECHOCARDIOGRAM COMPLETE BUBBLE STUDY
AR max vel: 1.92 cm2
AV Area VTI: 2.14 cm2
AV Area mean vel: 2.09 cm2
AV Mean grad: 2 mm[Hg]
AV Peak grad: 6.4 mm[Hg]
Ao pk vel: 1.26 m/s
Area-P 1/2: 3.31 cm2
MV VTI: 2.28 cm2
S' Lateral: 2.7 cm

## 2023-06-08 LAB — BASIC METABOLIC PANEL
Anion gap: 8 (ref 5–15)
BUN: 5 mg/dL — ABNORMAL LOW (ref 8–23)
CO2: 26 mmol/L (ref 22–32)
Calcium: 7.8 mg/dL — ABNORMAL LOW (ref 8.9–10.3)
Chloride: 103 mmol/L (ref 98–111)
Creatinine, Ser: 0.79 mg/dL (ref 0.61–1.24)
GFR, Estimated: >60 mL/min (ref >60)
Glucose, Bld: 116 mg/dL — ABNORMAL HIGH (ref 70–99)
Potassium: 3.2 mmol/L — ABNORMAL LOW (ref 3.5–5.1)
Sodium: 137 mmol/L (ref 135–145)

## 2023-06-08 LAB — LIPASE, BLOOD: Lipase: 34 U/L (ref 11–51)

## 2023-06-08 LAB — LDL CHOLESTEROL, DIRECT: Direct LDL: 12 mg/dL (ref 0–99)

## 2023-06-08 LAB — MAGNESIUM: Magnesium: 2.2 mg/dL (ref 1.7–2.4)

## 2023-06-08 MED ORDER — ASPIRIN 81 MG PO TBEC: 81.0000 mg | DELAYED_RELEASE_TABLET | Freq: Every day | ORAL | 12 refills | Status: AC

## 2023-06-08 MED ORDER — POTASSIUM CHLORIDE CRYS ER 20 MEQ PO TBCR
40.0000 meq | EXTENDED_RELEASE_TABLET | Freq: Once | ORAL | Status: AC
  Administered 2023-06-08: 40 meq via ORAL
  Filled 2023-06-08: qty 2

## 2023-06-08 MED ORDER — CIPROFLOXACIN HCL 500 MG PO TABS
500.0000 mg | ORAL_TABLET | Freq: Two times a day (BID) | ORAL | 0 refills | Status: AC
Start: 2023-06-08 — End: 2023-06-11

## 2023-06-08 MED ORDER — METRONIDAZOLE 500 MG PO TABS
500.0000 mg | ORAL_TABLET | Freq: Three times a day (TID) | ORAL | 0 refills | Status: AC
Start: 2023-06-08 — End: 2023-06-11

## 2023-06-08 MED ORDER — CLOPIDOGREL BISULFATE 75 MG PO TABS: 75.0000 mg | ORAL_TABLET | Freq: Every day | ORAL | 0 refills | Status: AC

## 2023-06-08 MED ORDER — ATORVASTATIN CALCIUM 80 MG PO TABS: 80.0000 mg | ORAL_TABLET | Freq: Every day | ORAL | 3 refills | Status: DC

## 2023-06-08 MED ORDER — PANTOPRAZOLE SODIUM 40 MG PO TBEC: 40.0000 mg | DELAYED_RELEASE_TABLET | Freq: Every day | ORAL | 3 refills | Status: DC

## 2023-06-08 NOTE — Progress Notes (Signed)
Eeg done 

## 2023-06-08 NOTE — Plan of Care (Signed)

## 2023-06-08 NOTE — Progress Notes (Signed)
*  PRELIMINARY RESULTS* Echocardiogram 2D Echocardiogram has been performed.  Carolyne Fiscal 06/08/2023, 11:54 AM

## 2023-06-08 NOTE — Plan of Care (Addendum)
Neurology plan of care  Stroke workup is now completed. Results as follows:  MRI brain 1. Small focus of acute/early subacute cortical ischemia within the left frontal lobe. No hemorrhage or mass effect. 2. 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  CTA H&N 1. No emergent large vessel occlusion 2. There is 50% stenosis of the proximal right ICA due to calcific atherosclerosis. 3. 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 4. Old bilateral frontal and parietal infarcts and findings of chronic small vessel disease.   CNS imaging personally reviewed I agree with radiology interpretation  TTE - normal LVEF, grade I diastolic dysfunction, no intracardiac clot, neg bubble  Stroke Labs     Component Value Date/Time   CHOL 101 01/24/2023 1440   CHOL 150 09/13/2011 0419   TRIG 87 01/24/2023 1440   TRIG 222 (H) 09/13/2011 0419   HDL 47 01/24/2023 1440   HDL 44 09/13/2011 0419   CHOLHDL 4.5 03/19/2018 1146   VLDL 44 (H) 09/13/2011 0419   LDLCALC 37 01/24/2023 1440   LDLCALC 62 09/13/2011 0419   LDLDIRECT 12 06/08/2023 0549   LABVLDL 17 01/24/2023 1440    Lab Results  Component Value Date/Time   HGBA1C 5.6 06/07/2023 05:00 AM   HGBA1C 5.3 11/14/2021 11:02 AM   A/P: This is a 62 year old gentleman with past medical history significant for hypertension, remote CVA, opioid abuse, depression admitted for generalized weakness, malaise, cough, abdominal pain, n/v. On 10/22 he became confused and lethargic. MRI brain revealed acute infarct L frontal lobe. Subsequently he improved and yesterday felt nearly back to baseline. Etiology of stroke is favored to be small vessel disease in the setting of multiple cerebrovascular risk factors. Stroke workup is now completed.  Recommendations: - ASA 81mg  daily + plavix 75mg  daily x21 days f/b ASA 81mg  daily monotherapy after that - Atorvastatin 80mg  daily (stop home  pravastatin) - STAT head CT for any change in neuro exam - Tele - PT/OT/SLP - Stroke education - Amb referral to neurology upon discharge - I will arrange  Neurology to sign off but please re-engage if additional neurologic concerns arise.  Bing Neighbors, MD [facility] [phone]  If 7pm- 7am, please page neurology on call as listed in [facility].

## 2023-06-08 NOTE — Progress Notes (Signed)
PHARMACY CONSULT NOTE - FOLLOW UP  Pharmacy Consult for Electrolyte Monitoring and Replacement   Recent Labs: Potassium (mmol/L)  Date Value  06/08/2023 3.2 (L)  09/21/2011 4.3   Magnesium (mg/dL)  Date Value  16/05/9603 2.2  09/12/2011 2.1   Calcium (mg/dL)  Date Value  54/04/8118 7.8 (L)   Calcium, Total (mg/dL)  Date Value  14/78/2956 9.1   Albumin (g/dL)  Date Value  21/30/8657 2.8 (L)  01/24/2023 4.5  09/21/2011 3.9   Phosphorus (mg/dL)  Date Value  84/69/6295 3.1   Sodium (mmol/L)  Date Value  06/08/2023 137  01/24/2023 138  09/21/2011 140     Assessment:  62 y.o. male w/ PMH of HTN, CVA, opioid abuse, depression, presented to the emergency room with acute onset of generalized fatigue and weakness and malaise. Pharmacy is asked to follow and replace electrolytes  Goal of Therapy:  Electrolytes WNL   Plan:  --K+ 3.2. Kcl 40 meq x 1. --Follow up BMP tomorrow AM   Elliot Gurney, PharmD, BCPS Clinical Pharmacist  06/08/2023 7:12 AM

## 2023-06-08 NOTE — Discharge Summary (Signed)
Physician Discharge Summary   Patient: Mark [name] MRN: [id number] DOB: Jan 25, 1961  Admit date:     06/03/2023  Discharge date: 06/08/23  Discharge Physician: Marcelino Duster   PCP: Marjie Skiff, NP   Recommendations at discharge:  {Tip this will not be part of the note when signed- Example include specific recommendations for outpatient follow-up, pending tests to follow-up on. (Optional):26781}  PCP follow up in 1 week  Discharge Diagnoses: Principal Problem:   Sepsis due to undetermined organism [facility]) Active Problems:   Essential hypertension   Common bile duct stone   Dyslipidemia   Anxiety and depression   BPH (benign prostatic hyperplasia)   GERD without esophagitis   Hypotension   Cholangitis   Common bile duct dilatation  Resolved Problems:   * No resolved hospital problems. [facility] Course: No notes on file  Assessment and Plan: * Sepsis due to undetermined organism [facility]) - The patient will be admitted to a medical telemetry bed. - His sepsis is likely secondary to his diffuse proctocolitis. - He will be continued on IV cefepime, vancomycin and Flagyl. - He will be continued on hydration with IV lactated Ringer. - We will follow blood cultures.  Common bile duct stone - The patient will have an MRCP. - GI consultation will be obtained. - I notified Dr. Norma Fredrickson about the patient and Dr. Tobi Bastos is aware. - Will follow LFTs. - Will check lipase level and keep the patient n.p.o. for now except for medications.  Essential hypertension - We will continue antihypertensive therapy.  Dyslipidemia - We will continue statin therapy.  Anxiety and depression - We will continue Celexa.  GERD without esophagitis - We will continue PPI therapy.  BPH (benign prostatic hyperplasia) .  Will continue Flomax.      {Tip this will not be part of the note when signed Body mass index is 21.85 kg/m. , ,  (Optional):26781}  {(NOTE) Pain control  PDMP Statment (Optional):26782} Consultants: *** Procedures performed: ***  Disposition: {Plan; Disposition:26390} Diet recommendation:  Discharge Diet Orders (From admission, onward)     Start     Ordered   06/08/23 0000  Diet - low sodium heart healthy       Comments: Dys 3 diet   06/08/23 1545           {Diet_Plan:26776} DISCHARGE MEDICATION: Allergies as of 06/08/2023       Reactions   Codeine Hives        Medication List     STOP taking these medications    omeprazole 20 MG capsule Commonly known as: PRILOSEC Replaced by: pantoprazole 40 MG tablet   pravastatin 20 MG tablet Commonly known as: PRAVACHOL       TAKE these medications    amLODipine 10 MG tablet Commonly known as: NORVASC Take 1 tablet (10 mg total) by mouth daily.   aspirin EC 81 MG tablet Take 1 tablet (81 mg total) by mouth daily. Swallow whole. Start taking on: June 09, 2023   atorvastatin 80 MG tablet Commonly known as: LIPITOR Take 1 tablet (80 mg total) by mouth daily. Start taking on: June 09, 2023   ciprofloxacin 500 MG tablet Commonly known as: Cipro Take 1 tablet (500 mg total) by mouth 2 (two) times daily for 3 days.   citalopram 20 MG tablet Commonly known as: CELEXA Take 1 tablet (20 mg total) by mouth daily.   clopidogrel 75 MG tablet Commonly known as: PLAVIX Take 1 tablet (75 mg  total) by mouth daily for 21 days. Start taking on: June 09, 2023   [employer] D3 25 MCG (1000 UT) capsule Generic drug: Cholecalciferol TAKE 2 CAPSULES (2,000 UNITS TOTAL) BY MOUTH DAILY.   cyanocobalamin 1000 MCG tablet Take 1 tablet (1,000 mcg total) by mouth daily.   gabapentin 300 MG capsule Commonly known as: NEURONTIN TAKE 1 CAPSULE BY MOUTH EVERYDAY AT BEDTIME   Melatonin 10 MG Tabs Take 10 mg by mouth at bedtime.   metoprolol tartrate 25 MG tablet Commonly known as: LOPRESSOR Take 1 tablet (25 mg total) by mouth 2 (two) times daily.   metroNIDAZOLE 500 MG  tablet Commonly known as: Flagyl Take 1 tablet (500 mg total) by mouth 3 (three) times daily for 3 days.   pantoprazole 40 MG tablet Commonly known as: PROTONIX Take 1 tablet (40 mg total) by mouth daily. Start taking on: June 09, 2023 Replaces: omeprazole 20 MG capsule   tamsulosin 0.4 MG Caps capsule Commonly known as: FLOMAX Take 1 capsule (0.4 mg total) by mouth daily.        Follow-up Information     Aura Dials T, NP Follow up on 06/19/2023.   Specialty: Nurse Practitioner Why: Go at 2:00pm. Contact information: [address] [place] [address] [phone]                Discharge Exam: Ceasar Mons Weights   06/04/23 0141  Weight: 67.1 kg   ***  Condition at discharge: {DC Condition:26389}  The results of significant diagnostics from this hospitalization (including imaging, microbiology, ancillary and laboratory) are listed below for reference.   Imaging Studies: ECHOCARDIOGRAM COMPLETE BUBBLE STUDY  Result Date: 06/08/2023    ECHOCARDIOGRAM REPORT   Patient Name:   CHANEY SY Leja Date of Exam: 06/08/2023 Medical Rec #:  [id number]         Height:       69.0 in Accession #:    [id number]        Weight:       147.9 lb Date of Birth:  18-Sep-1960         BSA:          1.817 m Patient Age:    62 years          BP:           136/84 mmHg Patient Gender: M                 HR:           64 bpm. Exam Location:  [facility] Procedure: 2D Echo, Cardiac Doppler, Color Doppler and Saline Contrast Bubble            Study Indications:     Stroke  History:         Patient has no prior history of Echocardiogram examinations.                  Stroke; Risk Factors:Hypertension, Dyslipidemia and Current                  Smoker.  Sonographer:     Mikki Harbor Referring Phys:  [id number] Malachi Carl STACK Diagnosing Phys: Debbe Odea MD  Sonographer Comments: Image acquisition challenging due to respiratory motion. IMPRESSIONS  1. Left ventricular ejection fraction, by estimation,  is 60 to 65%. The left ventricle has normal function. The left ventricle has no regional wall motion abnormalities. There is mild left ventricular hypertrophy. Left ventricular diastolic parameters are consistent with Grade  Physician Discharge Summary   Patient: Mark [name] MRN: [id number] DOB: Jan 25, 1961  Admit date:     06/03/2023  Discharge date: 06/08/23  Discharge Physician: Marcelino Duster   PCP: Marjie Skiff, NP   Recommendations at discharge:  {Tip this will not be part of the note when signed- Example include specific recommendations for outpatient follow-up, pending tests to follow-up on. (Optional):26781}  PCP follow up in 1 week  Discharge Diagnoses: Principal Problem:   Sepsis due to undetermined organism [facility]) Active Problems:   Essential hypertension   Common bile duct stone   Dyslipidemia   Anxiety and depression   BPH (benign prostatic hyperplasia)   GERD without esophagitis   Hypotension   Cholangitis   Common bile duct dilatation  Resolved Problems:   * No resolved hospital problems. [facility] Course: No notes on file  Assessment and Plan: * Sepsis due to undetermined organism [facility]) - The patient will be admitted to a medical telemetry bed. - His sepsis is likely secondary to his diffuse proctocolitis. - He will be continued on IV cefepime, vancomycin and Flagyl. - He will be continued on hydration with IV lactated Ringer. - We will follow blood cultures.  Common bile duct stone - The patient will have an MRCP. - GI consultation will be obtained. - I notified Dr. Norma Fredrickson about the patient and Dr. Tobi Bastos is aware. - Will follow LFTs. - Will check lipase level and keep the patient n.p.o. for now except for medications.  Essential hypertension - We will continue antihypertensive therapy.  Dyslipidemia - We will continue statin therapy.  Anxiety and depression - We will continue Celexa.  GERD without esophagitis - We will continue PPI therapy.  BPH (benign prostatic hyperplasia) .  Will continue Flomax.      {Tip this will not be part of the note when signed Body mass index is 21.85 kg/m. , ,  (Optional):26781}  {(NOTE) Pain control  PDMP Statment (Optional):26782} Consultants: *** Procedures performed: ***  Disposition: {Plan; Disposition:26390} Diet recommendation:  Discharge Diet Orders (From admission, onward)     Start     Ordered   06/08/23 0000  Diet - low sodium heart healthy       Comments: Dys 3 diet   06/08/23 1545           {Diet_Plan:26776} DISCHARGE MEDICATION: Allergies as of 06/08/2023       Reactions   Codeine Hives        Medication List     STOP taking these medications    omeprazole 20 MG capsule Commonly known as: PRILOSEC Replaced by: pantoprazole 40 MG tablet   pravastatin 20 MG tablet Commonly known as: PRAVACHOL       TAKE these medications    amLODipine 10 MG tablet Commonly known as: NORVASC Take 1 tablet (10 mg total) by mouth daily.   aspirin EC 81 MG tablet Take 1 tablet (81 mg total) by mouth daily. Swallow whole. Start taking on: June 09, 2023   atorvastatin 80 MG tablet Commonly known as: LIPITOR Take 1 tablet (80 mg total) by mouth daily. Start taking on: June 09, 2023   ciprofloxacin 500 MG tablet Commonly known as: Cipro Take 1 tablet (500 mg total) by mouth 2 (two) times daily for 3 days.   citalopram 20 MG tablet Commonly known as: CELEXA Take 1 tablet (20 mg total) by mouth daily.   clopidogrel 75 MG tablet Commonly known as: PLAVIX Take 1 tablet (75 mg  total) by mouth daily for 21 days. Start taking on: June 09, 2023   CVS D3 25 MCG (1000 UT) capsule Generic drug: Cholecalciferol TAKE 2 CAPSULES (2,000 UNITS TOTAL) BY MOUTH DAILY.   cyanocobalamin 1000 MCG tablet Take 1 tablet (1,000 mcg total) by mouth daily.   gabapentin 300 MG capsule Commonly known as: NEURONTIN TAKE 1 CAPSULE BY MOUTH EVERYDAY AT BEDTIME   Melatonin 10 MG Tabs Take 10 mg by mouth at bedtime.   metoprolol tartrate 25 MG tablet Commonly known as: LOPRESSOR Take 1 tablet (25 mg total) by mouth 2 (two) times daily.   metroNIDAZOLE 500 MG  tablet Commonly known as: Flagyl Take 1 tablet (500 mg total) by mouth 3 (three) times daily for 3 days.   pantoprazole 40 MG tablet Commonly known as: PROTONIX Take 1 tablet (40 mg total) by mouth daily. Start taking on: June 09, 2023 Replaces: omeprazole 20 MG capsule   tamsulosin 0.4 MG Caps capsule Commonly known as: FLOMAX Take 1 capsule (0.4 mg total) by mouth daily.        Follow-up Information     Aura Dials T, NP Follow up on 06/19/2023.   Specialty: Nurse Practitioner Why: Go at 2:00pm. Contact information: [address] [place] [address] [phone]                Discharge Exam: Ceasar Mons Weights   06/04/23 0141  Weight: 67.1 kg   ***  Condition at discharge: {DC Condition:26389}  The results of significant diagnostics from this hospitalization (including imaging, microbiology, ancillary and laboratory) are listed below for reference.   Imaging Studies: ECHOCARDIOGRAM COMPLETE BUBBLE STUDY  Result Date: 06/08/2023    ECHOCARDIOGRAM REPORT   Patient Name:   CHANEY SY Leja Date of Exam: 06/08/2023 Medical Rec #:  [id number]         Height:       69.0 in Accession #:    [id number]        Weight:       147.9 lb Date of Birth:  18-Sep-1960         BSA:          1.817 m Patient Age:    62 years          BP:           136/84 mmHg Patient Gender: M                 HR:           64 bpm. Exam Location:  [facility] Procedure: 2D Echo, Cardiac Doppler, Color Doppler and Saline Contrast Bubble            Study Indications:     Stroke  History:         Patient has no prior history of Echocardiogram examinations.                  Stroke; Risk Factors:Hypertension, Dyslipidemia and Current                  Smoker.  Sonographer:     Mikki Harbor Referring Phys:  [id number] Malachi Carl STACK Diagnosing Phys: Debbe Odea MD  Sonographer Comments: Image acquisition challenging due to respiratory motion. IMPRESSIONS  1. Left ventricular ejection fraction, by estimation,  is 60 to 65%. The left ventricle has normal function. The left ventricle has no regional wall motion abnormalities. There is mild left ventricular hypertrophy. Left ventricular diastolic parameters are consistent with Grade  total) by mouth daily for 21 days. Start taking on: June 09, 2023   CVS D3 25 MCG (1000 UT) capsule Generic drug: Cholecalciferol TAKE 2 CAPSULES (2,000 UNITS TOTAL) BY MOUTH DAILY.   cyanocobalamin 1000 MCG tablet Take 1 tablet (1,000 mcg total) by mouth daily.   gabapentin 300 MG capsule Commonly known as: NEURONTIN TAKE 1 CAPSULE BY MOUTH EVERYDAY AT BEDTIME   Melatonin 10 MG Tabs Take 10 mg by mouth at bedtime.   metoprolol tartrate 25 MG tablet Commonly known as: LOPRESSOR Take 1 tablet (25 mg total) by mouth 2 (two) times daily.   metroNIDAZOLE 500 MG  tablet Commonly known as: Flagyl Take 1 tablet (500 mg total) by mouth 3 (three) times daily for 3 days.   pantoprazole 40 MG tablet Commonly known as: PROTONIX Take 1 tablet (40 mg total) by mouth daily. Start taking on: June 09, 2023 Replaces: omeprazole 20 MG capsule   tamsulosin 0.4 MG Caps capsule Commonly known as: FLOMAX Take 1 capsule (0.4 mg total) by mouth daily.        Follow-up Information     Aura Dials T, NP Follow up on 06/19/2023.   Specialty: Nurse Practitioner Why: Go at 2:00pm. Contact information: [address] [place] [address] [phone]                Discharge Exam: Ceasar Mons Weights   06/04/23 0141  Weight: 67.1 kg   ***  Condition at discharge: {DC Condition:26389}  The results of significant diagnostics from this hospitalization (including imaging, microbiology, ancillary and laboratory) are listed below for reference.   Imaging Studies: ECHOCARDIOGRAM COMPLETE BUBBLE STUDY  Result Date: 06/08/2023    ECHOCARDIOGRAM REPORT   Patient Name:   CHANEY SY Leja Date of Exam: 06/08/2023 Medical Rec #:  [id number]         Height:       69.0 in Accession #:    [id number]        Weight:       147.9 lb Date of Birth:  18-Sep-1960         BSA:          1.817 m Patient Age:    62 years          BP:           136/84 mmHg Patient Gender: M                 HR:           64 bpm. Exam Location:  [facility] Procedure: 2D Echo, Cardiac Doppler, Color Doppler and Saline Contrast Bubble            Study Indications:     Stroke  History:         Patient has no prior history of Echocardiogram examinations.                  Stroke; Risk Factors:Hypertension, Dyslipidemia and Current                  Smoker.  Sonographer:     Mikki Harbor Referring Phys:  [id number] Malachi Carl STACK Diagnosing Phys: Debbe Odea MD  Sonographer Comments: Image acquisition challenging due to respiratory motion. IMPRESSIONS  1. Left ventricular ejection fraction, by estimation,  is 60 to 65%. The left ventricle has normal function. The left ventricle has no regional wall motion abnormalities. There is mild left ventricular hypertrophy. Left ventricular diastolic parameters are consistent with Grade  Physician Discharge Summary   Patient: Mark [name] MRN: [id number] DOB: Jan 25, 1961  Admit date:     06/03/2023  Discharge date: 06/08/23  Discharge Physician: Marcelino Duster   PCP: Marjie Skiff, NP   Recommendations at discharge:  {Tip this will not be part of the note when signed- Example include specific recommendations for outpatient follow-up, pending tests to follow-up on. (Optional):26781}  PCP follow up in 1 week  Discharge Diagnoses: Principal Problem:   Sepsis due to undetermined organism [facility]) Active Problems:   Essential hypertension   Common bile duct stone   Dyslipidemia   Anxiety and depression   BPH (benign prostatic hyperplasia)   GERD without esophagitis   Hypotension   Cholangitis   Common bile duct dilatation  Resolved Problems:   * No resolved hospital problems. [facility] Course: No notes on file  Assessment and Plan: * Sepsis due to undetermined organism [facility]) - The patient will be admitted to a medical telemetry bed. - His sepsis is likely secondary to his diffuse proctocolitis. - He will be continued on IV cefepime, vancomycin and Flagyl. - He will be continued on hydration with IV lactated Ringer. - We will follow blood cultures.  Common bile duct stone - The patient will have an MRCP. - GI consultation will be obtained. - I notified Dr. Norma Fredrickson about the patient and Dr. Tobi Bastos is aware. - Will follow LFTs. - Will check lipase level and keep the patient n.p.o. for now except for medications.  Essential hypertension - We will continue antihypertensive therapy.  Dyslipidemia - We will continue statin therapy.  Anxiety and depression - We will continue Celexa.  GERD without esophagitis - We will continue PPI therapy.  BPH (benign prostatic hyperplasia) .  Will continue Flomax.      {Tip this will not be part of the note when signed Body mass index is 21.85 kg/m. , ,  (Optional):26781}  {(NOTE) Pain control  PDMP Statment (Optional):26782} Consultants: *** Procedures performed: ***  Disposition: {Plan; Disposition:26390} Diet recommendation:  Discharge Diet Orders (From admission, onward)     Start     Ordered   06/08/23 0000  Diet - low sodium heart healthy       Comments: Dys 3 diet   06/08/23 1545           {Diet_Plan:26776} DISCHARGE MEDICATION: Allergies as of 06/08/2023       Reactions   Codeine Hives        Medication List     STOP taking these medications    omeprazole 20 MG capsule Commonly known as: PRILOSEC Replaced by: pantoprazole 40 MG tablet   pravastatin 20 MG tablet Commonly known as: PRAVACHOL       TAKE these medications    amLODipine 10 MG tablet Commonly known as: NORVASC Take 1 tablet (10 mg total) by mouth daily.   aspirin EC 81 MG tablet Take 1 tablet (81 mg total) by mouth daily. Swallow whole. Start taking on: June 09, 2023   atorvastatin 80 MG tablet Commonly known as: LIPITOR Take 1 tablet (80 mg total) by mouth daily. Start taking on: June 09, 2023   ciprofloxacin 500 MG tablet Commonly known as: Cipro Take 1 tablet (500 mg total) by mouth 2 (two) times daily for 3 days.   citalopram 20 MG tablet Commonly known as: CELEXA Take 1 tablet (20 mg total) by mouth daily.   clopidogrel 75 MG tablet Commonly known as: PLAVIX Take 1 tablet (75 mg  Physician Discharge Summary   Patient: Mark [name] MRN: [id number] DOB: Jan 25, 1961  Admit date:     06/03/2023  Discharge date: 06/08/23  Discharge Physician: Marcelino Duster   PCP: Marjie Skiff, NP   Recommendations at discharge:  {Tip this will not be part of the note when signed- Example include specific recommendations for outpatient follow-up, pending tests to follow-up on. (Optional):26781}  PCP follow up in 1 week  Discharge Diagnoses: Principal Problem:   Sepsis due to undetermined organism [facility]) Active Problems:   Essential hypertension   Common bile duct stone   Dyslipidemia   Anxiety and depression   BPH (benign prostatic hyperplasia)   GERD without esophagitis   Hypotension   Cholangitis   Common bile duct dilatation  Resolved Problems:   * No resolved hospital problems. [facility] Course: No notes on file  Assessment and Plan: * Sepsis due to undetermined organism [facility]) - The patient will be admitted to a medical telemetry bed. - His sepsis is likely secondary to his diffuse proctocolitis. - He will be continued on IV cefepime, vancomycin and Flagyl. - He will be continued on hydration with IV lactated Ringer. - We will follow blood cultures.  Common bile duct stone - The patient will have an MRCP. - GI consultation will be obtained. - I notified Dr. Norma Fredrickson about the patient and Dr. Tobi Bastos is aware. - Will follow LFTs. - Will check lipase level and keep the patient n.p.o. for now except for medications.  Essential hypertension - We will continue antihypertensive therapy.  Dyslipidemia - We will continue statin therapy.  Anxiety and depression - We will continue Celexa.  GERD without esophagitis - We will continue PPI therapy.  BPH (benign prostatic hyperplasia) .  Will continue Flomax.      {Tip this will not be part of the note when signed Body mass index is 21.85 kg/m. , ,  (Optional):26781}  {(NOTE) Pain control  PDMP Statment (Optional):26782} Consultants: *** Procedures performed: ***  Disposition: {Plan; Disposition:26390} Diet recommendation:  Discharge Diet Orders (From admission, onward)     Start     Ordered   06/08/23 0000  Diet - low sodium heart healthy       Comments: Dys 3 diet   06/08/23 1545           {Diet_Plan:26776} DISCHARGE MEDICATION: Allergies as of 06/08/2023       Reactions   Codeine Hives        Medication List     STOP taking these medications    omeprazole 20 MG capsule Commonly known as: PRILOSEC Replaced by: pantoprazole 40 MG tablet   pravastatin 20 MG tablet Commonly known as: PRAVACHOL       TAKE these medications    amLODipine 10 MG tablet Commonly known as: NORVASC Take 1 tablet (10 mg total) by mouth daily.   aspirin EC 81 MG tablet Take 1 tablet (81 mg total) by mouth daily. Swallow whole. Start taking on: June 09, 2023   atorvastatin 80 MG tablet Commonly known as: LIPITOR Take 1 tablet (80 mg total) by mouth daily. Start taking on: June 09, 2023   ciprofloxacin 500 MG tablet Commonly known as: Cipro Take 1 tablet (500 mg total) by mouth 2 (two) times daily for 3 days.   citalopram 20 MG tablet Commonly known as: CELEXA Take 1 tablet (20 mg total) by mouth daily.   clopidogrel 75 MG tablet Commonly known as: PLAVIX Take 1 tablet (75 mg  Physician Discharge Summary   Patient: Mark [name] MRN: [id number] DOB: Jan 25, 1961  Admit date:     06/03/2023  Discharge date: 06/08/23  Discharge Physician: Marcelino Duster   PCP: Marjie Skiff, NP   Recommendations at discharge:  {Tip this will not be part of the note when signed- Example include specific recommendations for outpatient follow-up, pending tests to follow-up on. (Optional):26781}  PCP follow up in 1 week  Discharge Diagnoses: Principal Problem:   Sepsis due to undetermined organism [facility]) Active Problems:   Essential hypertension   Common bile duct stone   Dyslipidemia   Anxiety and depression   BPH (benign prostatic hyperplasia)   GERD without esophagitis   Hypotension   Cholangitis   Common bile duct dilatation  Resolved Problems:   * No resolved hospital problems. [facility] Course: No notes on file  Assessment and Plan: * Sepsis due to undetermined organism [facility]) - The patient will be admitted to a medical telemetry bed. - His sepsis is likely secondary to his diffuse proctocolitis. - He will be continued on IV cefepime, vancomycin and Flagyl. - He will be continued on hydration with IV lactated Ringer. - We will follow blood cultures.  Common bile duct stone - The patient will have an MRCP. - GI consultation will be obtained. - I notified Dr. Norma Fredrickson about the patient and Dr. Tobi Bastos is aware. - Will follow LFTs. - Will check lipase level and keep the patient n.p.o. for now except for medications.  Essential hypertension - We will continue antihypertensive therapy.  Dyslipidemia - We will continue statin therapy.  Anxiety and depression - We will continue Celexa.  GERD without esophagitis - We will continue PPI therapy.  BPH (benign prostatic hyperplasia) .  Will continue Flomax.      {Tip this will not be part of the note when signed Body mass index is 21.85 kg/m. , ,  (Optional):26781}  {(NOTE) Pain control  PDMP Statment (Optional):26782} Consultants: *** Procedures performed: ***  Disposition: {Plan; Disposition:26390} Diet recommendation:  Discharge Diet Orders (From admission, onward)     Start     Ordered   06/08/23 0000  Diet - low sodium heart healthy       Comments: Dys 3 diet   06/08/23 1545           {Diet_Plan:26776} DISCHARGE MEDICATION: Allergies as of 06/08/2023       Reactions   Codeine Hives        Medication List     STOP taking these medications    omeprazole 20 MG capsule Commonly known as: PRILOSEC Replaced by: pantoprazole 40 MG tablet   pravastatin 20 MG tablet Commonly known as: PRAVACHOL       TAKE these medications    amLODipine 10 MG tablet Commonly known as: NORVASC Take 1 tablet (10 mg total) by mouth daily.   aspirin EC 81 MG tablet Take 1 tablet (81 mg total) by mouth daily. Swallow whole. Start taking on: June 09, 2023   atorvastatin 80 MG tablet Commonly known as: LIPITOR Take 1 tablet (80 mg total) by mouth daily. Start taking on: June 09, 2023   ciprofloxacin 500 MG tablet Commonly known as: Cipro Take 1 tablet (500 mg total) by mouth 2 (two) times daily for 3 days.   citalopram 20 MG tablet Commonly known as: CELEXA Take 1 tablet (20 mg total) by mouth daily.   clopidogrel 75 MG tablet Commonly known as: PLAVIX Take 1 tablet (75 mg  Physician Discharge Summary   Patient: Mark [name] MRN: [id number] DOB: Jan 25, 1961  Admit date:     06/03/2023  Discharge date: 06/08/23  Discharge Physician: Marcelino Duster   PCP: Marjie Skiff, NP   Recommendations at discharge:  {Tip this will not be part of the note when signed- Example include specific recommendations for outpatient follow-up, pending tests to follow-up on. (Optional):26781}  PCP follow up in 1 week  Discharge Diagnoses: Principal Problem:   Sepsis due to undetermined organism [facility]) Active Problems:   Essential hypertension   Common bile duct stone   Dyslipidemia   Anxiety and depression   BPH (benign prostatic hyperplasia)   GERD without esophagitis   Hypotension   Cholangitis   Common bile duct dilatation  Resolved Problems:   * No resolved hospital problems. [facility] Course: No notes on file  Assessment and Plan: * Sepsis due to undetermined organism [facility]) - The patient will be admitted to a medical telemetry bed. - His sepsis is likely secondary to his diffuse proctocolitis. - He will be continued on IV cefepime, vancomycin and Flagyl. - He will be continued on hydration with IV lactated Ringer. - We will follow blood cultures.  Common bile duct stone - The patient will have an MRCP. - GI consultation will be obtained. - I notified Dr. Norma Fredrickson about the patient and Dr. Tobi Bastos is aware. - Will follow LFTs. - Will check lipase level and keep the patient n.p.o. for now except for medications.  Essential hypertension - We will continue antihypertensive therapy.  Dyslipidemia - We will continue statin therapy.  Anxiety and depression - We will continue Celexa.  GERD without esophagitis - We will continue PPI therapy.  BPH (benign prostatic hyperplasia) .  Will continue Flomax.      {Tip this will not be part of the note when signed Body mass index is 21.85 kg/m. , ,  (Optional):26781}  {(NOTE) Pain control  PDMP Statment (Optional):26782} Consultants: *** Procedures performed: ***  Disposition: {Plan; Disposition:26390} Diet recommendation:  Discharge Diet Orders (From admission, onward)     Start     Ordered   06/08/23 0000  Diet - low sodium heart healthy       Comments: Dys 3 diet   06/08/23 1545           {Diet_Plan:26776} DISCHARGE MEDICATION: Allergies as of 06/08/2023       Reactions   Codeine Hives        Medication List     STOP taking these medications    omeprazole 20 MG capsule Commonly known as: PRILOSEC Replaced by: pantoprazole 40 MG tablet   pravastatin 20 MG tablet Commonly known as: PRAVACHOL       TAKE these medications    amLODipine 10 MG tablet Commonly known as: NORVASC Take 1 tablet (10 mg total) by mouth daily.   aspirin EC 81 MG tablet Take 1 tablet (81 mg total) by mouth daily. Swallow whole. Start taking on: June 09, 2023   atorvastatin 80 MG tablet Commonly known as: LIPITOR Take 1 tablet (80 mg total) by mouth daily. Start taking on: June 09, 2023   ciprofloxacin 500 MG tablet Commonly known as: Cipro Take 1 tablet (500 mg total) by mouth 2 (two) times daily for 3 days.   citalopram 20 MG tablet Commonly known as: CELEXA Take 1 tablet (20 mg total) by mouth daily.   clopidogrel 75 MG tablet Commonly known as: PLAVIX Take 1 tablet (75 mg

## 2023-06-08 NOTE — Progress Notes (Signed)
Mobility Specialist - Progress Note     06/08/23 1032  Mobility  Activity Ambulated with assistance in room  Level of Assistance Standby assist, set-up cues, supervision of patient - no hands on  Assistive Device None  Distance Ambulated (ft) 10 ft  Range of Motion/Exercises Active  Activity Response Tolerated well  Mobility Referral Yes  $Mobility charge 1 Mobility   Author responding to bed alarm upon entry. Pt was attempting to exit room SBA with no RW. Pt confused asking about pain meds and saying he needed to call the hospital. Pt redirected back to bed and RN notified. Bed alarm activated.   Johnathan Hausen Mobility Specialist 06/08/23, 10:36 AM

## 2023-06-08 NOTE — Consult Note (Signed)
[facility] Customer service manager [facility]) Accountable Care Organization (ACO) [facility] Liaison Note  06/08/2023  Mark [name] 01/20/1961 [id number]  Location: [facility] RN Hospital Liaison screened the patient remotely at [facility].  Insurance: [employer] HMO   Mark [name] is a 62 y.o. male who is a Primary Care Patient of Cannady, Dorie Rank, NP [facility]. The patient was screened for  readmission hospitalization with noted medium risk score for unplanned readmission risk with 1 IP/1 ED in 6 months.  The patient was assessed for potential [facility]) Care Management service needs for post hospital transition for care coordination. Review of patient's electronic medical record reveals patient was admitted with Sepsis. Liaison attempted outreach call to pt prior to his discharge however unsuccessful. Based upon his admitting diagnosis will make a referral for a post hospital prevention readmission follow up call from a nurse care coordinator for any potential management of care.   Plan: [facility] Liaison will continue to follow progress and disposition to asess for post hospital community care coordination/management needs.  Referral request for community care coordination: Will make a referral for a nurse care coordinator.   [facility] Care Management/Population Health does not replace or interfere with any arrangements made by the Inpatient Transition of Care team.   For questions contact:   Elliot Cousin, RN, [facility] Liaison [place]   [facility], Population Health Office Hours MTWF  8:00 am-6:00 pm Direct Dial: [phone] mobile [phone] [Office toll free line] Office Hours are M-F 8:30 - 5 pm [email or web address]

## 2023-06-10 DIAGNOSIS — K529 Noninfective gastroenteritis and colitis, unspecified: Secondary | ICD-10-CM | POA: Insufficient documentation

## 2023-06-10 DIAGNOSIS — G9341 Metabolic encephalopathy: Secondary | ICD-10-CM | POA: Insufficient documentation

## 2023-06-11 ENCOUNTER — Telehealth: Payer: Self-pay | Admitting: *Deleted

## 2023-06-11 NOTE — Procedures (Signed)
Patient Name: Mark [name]  MRN: [id number]  Epilepsy Attending: Charlsie Quest  Referring Physician/Provider: Jefferson Fuel, MD  Date: 06/08/2023 Duration: 25.55 mins  Patient history: 62 year old male with altered mental status getting EEG to alert for seizure.  Level of alertness: Awake  AEDs during EEG study: None  Technical aspects: This EEG study was done with scalp electrodes positioned according to the 10-20 International system of electrode placement. Electrical activity was reviewed with band pass filter of 1-70Hz , sensitivity of 7 uV/mm, display speed of 85mm/sec with a 60Hz  notched filter applied as appropriate. EEG data were recorded continuously and digitally stored.  Video monitoring was available and reviewed as appropriate.  Description: The posterior dominant rhythm consists of 8 Hz activity of moderate voltage (25-35 uV) seen predominantly in posterior head regions, symmetric and reactive to eye opening and eye closing. Hyperventilation and photic stimulation were not performed.     IMPRESSION: This study is within normal limits. No seizures or epileptiform discharges were seen throughout the recording.  A normal interictal EEG does not exclude the diagnosis of epilepsy.  [name] Annabelle Harman

## 2023-06-11 NOTE — Transitions of Care (Post Inpatient/ED Visit) (Signed)
06/11/2023  Name: Mark [name] MRN: [id number] DOB: 28-Oct-1960  Today's TOC FU Call Status: Today's TOC FU Call Status:: Unsuccessful Call (1st Attempt) Unsuccessful Call (1st Attempt) Date: 06/11/23  Attempted to reach the patient regarding the most recent Inpatient/ED visit.  Follow Up Plan: Additional outreach attempts will be made to reach the patient to complete the Transitions of Care (Post Inpatient/ED visit) call.   Gean Maidens BSN RN [facility] Care Management [phone]

## 2023-06-11 NOTE — Progress Notes (Signed)
Care Coordination  Outreach Note  06/11/2023 Name: GIULIAN ASSI MRN: [id number] DOB: [birth year]   Care Coordination Outreach Attempts: An unsuccessful telephone outreach was attempted today to offer the patient information about available care coordination services.  Follow Up Plan:  Additional outreach attempts will be made to offer the patient care coordination information and services.   Encounter Outcome:  No Answer  Burman Nieves, CCMA Care Coordination Care Guide Direct Dial: [phone]

## 2023-06-12 ENCOUNTER — Telehealth: Payer: Self-pay

## 2023-06-12 NOTE — Transitions of Care (Post Inpatient/ED Visit) (Signed)
06/12/2023  Name: Mark [name] MRN: [id number] DOB: 14-Sep-1960  Today's TOC FU Call Status: Today's TOC FU Call Status:: Unsuccessful Call (2nd Attempt) Unsuccessful Call (2nd Attempt) Date: 06/12/23  Attempted to reach the patient regarding the most recent Inpatient/ED visit.  Follow Up Plan: Additional outreach attempts will be made to reach the patient to complete the Transitions of Care (Post Inpatient/ED visit) call.   Susa Loffler , BSN, RN Care Management Coordinator [place]   [facility] [email or web address] Direct Dial: [phone]

## 2023-06-13 ENCOUNTER — Telehealth: Payer: Self-pay

## 2023-06-13 LAB — CULTURE, BLOOD (ROUTINE X 2)
Culture: NO GROWTH
Culture: NO GROWTH
Special Requests: ADEQUATE
Special Requests: ADEQUATE

## 2023-06-13 NOTE — Transitions of Care (Post Inpatient/ED Visit) (Signed)
06/13/2023  Name: Mark [name] MRN: [id number] DOB: Jul 19, 1961  Today's TOC FU Call Status: Today's TOC FU Call Status:: Unsuccessful Call (3rd Attempt) Unsuccessful Call (1st Attempt) Date: 06/11/23 Unsuccessful Call (2nd Attempt) Date: 06/12/23 Unsuccessful Call (3rd Attempt) Date: 06/13/23  Attempted to reach the patient regarding the most recent Inpatient/ED visit.  Follow Up Plan: No further outreach attempts will be made at this time. We have been unable to contact the patient.  Susa Loffler , BSN, RN Care Management Coordinator [place]   [facility] [email or web address] Direct Dial: [phone]

## 2023-06-14 NOTE — Progress Notes (Signed)
Care Coordination Note  06/14/2023 Name: Mark [name] MRN: [id number] DOB: 26-Apr-1961  Mark [name] is a 62 y.o. year old male who is a primary care patient of Marjie Skiff, NP and is actively engaged with the care management team. I reached out to [name] by phone today to assist with re-scheduling a follow up visit with the RN Case Manager  Follow up plan: 2nd Unsuccessful telephone outreach attempt made. A HIPAA compliant phone message was left for the patient providing contact information and requesting a return call.   Burman Nieves, CCMA Care Coordination Care Guide Direct Dial: [phone]

## 2023-06-16 NOTE — Patient Instructions (Signed)
Biliary Colic, Adult  Biliary colic is severe pain caused by a problem with the gallbladder. The gallbladder is a small organ in the upper right part of the abdomen. The gallbladder stores a digestive fluid produced in the liver (bile) that helps the body break down fat. Bile and other digestive enzymes are carried from the liver to the small intestine through tube-like structures called bile ducts. The gallbladder and the bile ducts form the biliary tract. Sometimes, hard deposits of digestive fluids (gallstones) form in the gallbladder and block the flow of bile from the gallbladder, causing biliary colic. This condition is also called a gallbladder attack. Gallstones can be as small as a grain of sand or as big as a golf ball. There could be just one gallstone in the gallbladder, or there could be many. What are the causes? This condition is usually caused by gallstones. Less often, a tumor could block the flow of bile from the gallbladder and trigger biliary colic. What increases the risk? The following factors may make you more likely to develop this condition: Being male. Having a family history of gallstones. Being obese. Losing weight suddenly or quickly. Eating a diet that is high in calories, low in fiber, and rich in refined carbohydrates, such as white bread and white rice. Having certain health conditions, such as: An intestinal disease that affects nutrient absorption, such as Crohn's disease. A metabolic condition, such as diabetes or metabolic syndrome. Metabolic syndrome occurs when someone has high blood pressure, high cholesterol, and diabetes. A blood condition, such as hemolytic anemia or sickle cell disease. What are the signs or symptoms? The main symptom of this condition is severe pain in the upper right side of the abdomen. You may feel this pain below the chest but above the hip. This pain often occurs at night or after eating a meal that is high in fat. This pain may  get worse for up to an hour and last as long as 12 hours. In most cases, the pain fades (subsides) within 2 hours. Other symptoms of this condition include: Nausea and vomiting. Pain under the right shoulder. How is this diagnosed? This condition is diagnosed based on your medical history, your symptoms, and a physical exam. You may also have tests, including: Blood tests to rule out infection or inflammation of the bile ducts, gallbladder, pancreas, or liver. Imaging studies, such as: An ultrasound. A CT scan. An MRI. In some cases, you may need to have an imaging study done using a small amount of radioactive material (nuclear medicine) to confirm the diagnosis. How is this treated? This condition may be treated with medicines to: Relieve your pain or nausea. Dissolve the gallstones. It may take months or years before the gallstones are completely gone. If you have gallstones, or if you have a tumor in the gallbladder that is causing biliary colic, you may need surgery to remove the gallbladder (cholecystectomy). Follow these instructions at home: Eating and drinking Drink enough fluid to keep your urine pale yellow. Follow instructions from your health care provider about eating or drinking restrictions. These may include avoiding: Fatty, greasy, and fried foods. Any foods that make the pain worse. Overeating. Having a large meal after not eating for a while. General instructions Take over-the-counter and prescription medicines only as told by your health care provider. Keep all follow-up visits as told by your health care provider. This is important. How is this prevented? Steps to prevent this condition include: Maintaining a healthy body  weight. Getting regular exercise. Eating a healthy diet that is high in fiber and low in fat. Limiting how much sugar and refined carbohydrates you eat. Contact a health care provider if: Your pain lasts more than 5 hours. You vomit. You  have a fever and chills. Your pain gets worse. Get help right away if: Your skin or the whites of your eyes look yellow (jaundice). Your have tea-colored urine and light-colored stools (feces). You are dizzy or you faint. Summary Biliary colic is severe pain caused by a problem with the gallbladder. The gallbladder is a small organ in the upper right part of your abdomen. Treatment for this condition may include medicine to relieve your pain or nausea, or medicine to slowly dissolve the gallstones. If you have gallstones, or if you have a tumor in the gallbladder that is causing biliary colic, you may need surgery to remove the gallbladder (cholecystectomy). This information is not intended to replace advice given to you by your health care provider. Make sure you discuss any questions you have with your health care provider. Document Revised: 06/14/2022 Document Reviewed: 06/14/2022 Elsevier Patient Education  2024 [employer].

## 2023-06-18 NOTE — Progress Notes (Signed)
  Care Coordination  Outreach Note  06/18/2023 Name: Mark [name] MRN: [id number] DOB: 05-29-1961   Care Coordination Outreach Attempts: A third unsuccessful outreach was attempted today to offer the patient with information about available care coordination services.  Follow Up Plan:  No further outreach attempts will be made at this time. We have been unable to contact the patient to offer or enroll patient in care coordination services  Encounter Outcome:  No Answer  Burman Nieves, [facility] Care Coordination Care Guide Direct Dial: [phone]

## 2023-06-19 ENCOUNTER — Ambulatory Visit (INDEPENDENT_AMBULATORY_CARE_PROVIDER_SITE_OTHER): Payer: Medicare PPO | Admitting: Nurse Practitioner

## 2023-06-19 ENCOUNTER — Encounter: Payer: Self-pay | Admitting: Nurse Practitioner

## 2023-06-19 VITALS — BP 108/60 | HR 73 | Temp 98.0°F | Resp 18 | Ht 66.0 in | Wt 138.4 lb

## 2023-06-19 DIAGNOSIS — F1111 Opioid abuse, in remission: Secondary | ICD-10-CM | POA: Diagnosis not present

## 2023-06-19 DIAGNOSIS — G319 Degenerative disease of nervous system, unspecified: Secondary | ICD-10-CM

## 2023-06-19 DIAGNOSIS — A419 Sepsis, unspecified organism: Secondary | ICD-10-CM

## 2023-06-19 DIAGNOSIS — R4189 Other symptoms and signs involving cognitive functions and awareness: Secondary | ICD-10-CM | POA: Diagnosis not present

## 2023-06-19 DIAGNOSIS — Z8673 Personal history of transient ischemic attack (TIA), and cerebral infarction without residual deficits: Secondary | ICD-10-CM

## 2023-06-19 DIAGNOSIS — I9589 Other hypotension: Secondary | ICD-10-CM

## 2023-06-19 DIAGNOSIS — F17219 Nicotine dependence, cigarettes, with unspecified nicotine-induced disorders: Secondary | ICD-10-CM

## 2023-06-19 DIAGNOSIS — E785 Hyperlipidemia, unspecified: Secondary | ICD-10-CM

## 2023-06-19 DIAGNOSIS — G9341 Metabolic encephalopathy: Secondary | ICD-10-CM

## 2023-06-19 DIAGNOSIS — K8050 Calculus of bile duct without cholangitis or cholecystitis without obstruction: Secondary | ICD-10-CM | POA: Diagnosis not present

## 2023-06-19 MED ORDER — TAMSULOSIN HCL 0.4 MG PO CAPS: 0.4000 mg | ORAL_CAPSULE | Freq: Every day | ORAL | 4 refills | Status: DC

## 2023-06-19 MED ORDER — CITALOPRAM HYDROBROMIDE 20 MG PO TABS: 20.0000 mg | ORAL_TABLET | Freq: Every day | ORAL | 4 refills | Status: DC

## 2023-06-19 MED ORDER — PANTOPRAZOLE SODIUM 40 MG PO TBEC: 40.0000 mg | DELAYED_RELEASE_TABLET | Freq: Every day | ORAL | 4 refills | Status: DC

## 2023-06-19 MED ORDER — METOPROLOL TARTRATE 25 MG PO TABS: 25.0000 mg | ORAL_TABLET | Freq: Two times a day (BID) | ORAL | 4 refills | Status: DC

## 2023-06-19 MED ORDER — GABAPENTIN 300 MG PO CAPS: 300.0000 mg | ORAL_CAPSULE | Freq: Every day | ORAL | 4 refills | Status: DC

## 2023-06-19 MED ORDER — AMLODIPINE BESYLATE 10 MG PO TABS: 10.0000 mg | ORAL_TABLET | Freq: Every day | ORAL | 4 refills | Status: DC

## 2023-06-19 MED ORDER — ATORVASTATIN CALCIUM 80 MG PO TABS: 80.0000 mg | ORAL_TABLET | Freq: Every day | ORAL | 4 refills | Status: DC

## 2023-06-19 NOTE — Assessment & Plan Note (Signed)
Acute while in hospital, improved at this time.  Obtain CBC and CMP today.

## 2023-06-19 NOTE — Assessment & Plan Note (Signed)
History of elevations, some low levels recently and in office today.  No symptoms reported.  Monitor closely and may need to reduce Amlodipine next visit.  Recommend compression hose daily at home, off at night and ensure good water intake.

## 2023-06-19 NOTE — Assessment & Plan Note (Signed)
Acute and improved at this time.  Check CBC and CMP today.

## 2023-06-19 NOTE — Assessment & Plan Note (Addendum)
Noted on CT imaging in ER 01/18/23 with history of CVA x 2 in 2014 and history of heavier alcohol use + opioid use.  Educated him on this and discussed we would monitor memory closely.  Ongoing memory changes since CVAs, recent one in hospital.  Noting some decline in memory today.  Referral to neurology placed.  Return in December and if ongoing start Aricept.

## 2023-06-19 NOTE — Assessment & Plan Note (Signed)
I have recommended complete cessation of tobacco use. I have discussed various options available for assistance with tobacco cessation including over the counter methods (Nicotine gum, patch and lozenges). We also discussed prescription options (Chantix, Nicotine Inhaler / Nasal Spray). The patient is not interested in pursuing any prescription tobacco cessation options at this time.  Recommend he continue yearly lung screening.

## 2023-06-19 NOTE — Progress Notes (Signed)
BP 108/60 (BP Location: Left Arm, Patient Position: Sitting)   Pulse 73   Temp 98 F (36.7 C) (Oral)   Resp 18   Ht 5\' 6"  (1.676 m)   Wt 138 lb 6.4 oz (62.8 kg)   SpO2 97%   BMI 22.34 kg/m    Subjective:    Patient ID: Mark [name], male    DOB: 10-Jun-1961, 62 y.o.   MRN: [id number]  HPI: Mark [name] is a 62 y.o. male  Chief Complaint  Patient presents with   Hospitalization Follow-up   He reports his brother Mark [name] will be moving in with him.  Mark [name] sitting out in car and PCP went out to verify this, he reports he is moving in and "should have moved in after mom died because he has gone down since then".  Mark [name] to place his brother on chart as point of contact.  Transition of Care Hospital Follow up.  Recent hospitalization on 06/03/23 for common bile duct stone and abdominal pain.  He was discharged on 06/08/23. His brother is currently in home helping him, reports he is going to move in with him.  Did have MRI brain in hospital that noted acute/subacute cortical ischemia within the left frontal lobe . 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  They started Plavix for 21 days and is to continue ASA daily.  Changed his Omeprazole to Protonix and changed his Pravastatin to Atorvastatin 80 MG.  Has lost 10 pounds since hospitalization.  He does report eating 2 meals a day at present and is able to keep these down.  Denies any nausea or vomiting.  Is taking Gabapentin at night for pain.  Had a cholangiopancreatography on 06/05/23, he does not remember anything that happened in hospital.  He reports he is feeling better.  Has pill box and is taking all medications he reports, states his son sets up pill box every week.  Is not scheduled to see neurology.  Continues to smoke a few cigarettes a day.  History of opioid use, denies any recent.  "Hospital Course: Mark [name] is a 62 y.o. male with medical history significant for essential hypertension, CVA, opioid abuse,  depression, presented to the emergency room with acute onset of generalized fatigue and weakness and malaise.  He admitted to cough occasionally productive of greenish sputum over the last week with occasional dyspnea without wheezing.  He denied any fever or chills.  He has been having occasional abdominal pain with nausea and vomiting.  When he was hypotensive in triage he was having mild lightheadedness and dizziness.     10/22: He was slow to answer questions during my interview and would fall asleep easily.  I discussed with patient's son thinks that his mental status is changed and he is usually not this confused.   10/23: MRI shows small focus of acute/early subacute cortical ischemia within the left frontal lobe. Will add asa, statin and get neuro c/s in am tomorrow   10/24: Neuro c/s - stroke work up in progress.   10/25: Etiology of stroke is favored to be small vessel disease in the setting of multiple cerebrovascular risk factors recommended ASA 81mg  daily + plavix 75mg  daily for 21 days then ASA 81mg  daily monotherapy.   Assessment and Plan: * Sepsis Ruled out Common bile duct obstruction Seen on MRCP- Biliary and pancreatic duct dilatation followed to the level of the ampulla.  S/p ERCP on 10/22 - The entire main bile  duct was dilated. 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  Biopsy negative for malignancy. CT abdomen showed diffuse proctocolitis. IV Rocephin and Flagyl transitioned to cipro and flagyl for 3 more days. Encourage oral fluids. No growth on blood cultures.   Acute metabolic encephalopathy: Could be due to acute/early subacute stroke seen on MRI He had CT head which did not show acute etiology.  He has multiple old infarcts. MRI brain shows small focus of acute/early subacute cortical ischemia within the left frontal lobe.  Seen by neuro Dr Selina Cooley - stroke work up favors small vessel disease ASA 81mg  daily and plavix for 21 days  per neuro  then aspirin daily. Omeprazole changed to pantoprazole due to interaction with Plavix.   Essential hypertension Continue current home meds, Norvasc, metoprolol.   Dyslipidemia Lipitor 80mg  daily.   Anxiety and depression continue Celexa.   GERD without esophagitis Omeprazole changed to Pantoprazole due to interaction with Plavix.   BPH (benign prostatic hyperplasia) On Flomax."  Hospital/Facility: [facility] D/C Physician:  Dr. Clide Dales D/C Date: 06/08/23  Records Requested: 06/19/23 Records Received: 06/19/23 Records Reviewed: 06/19/23  Diagnoses on Discharge: Common bile duct stone and Acute Stroke  Date of interactive Contact within 48 hours of discharge:  Contact was through: phone  Date of 7 day or 14 day face-to-face visit:    within 14 days     06/19/2023    2:19 PM 07/25/2022   11:07 AM 03/18/2021    2:39 PM 05/30/2017    3:18 PM  6CIT Screen  What Year? 4 points 0 points 0 points 0 points  What month? 0 points 0 points 0 points 0 points  What time? 0 points 0 points 0 points 0 points  Count back from 20 4 points 2 points 2 points 0 points  Months in reverse 4 points 2 points 4 points 0 points  Repeat phrase 4 points 6 points 6 points 2 points  Total Score 16 points 10 points 12 points 2 points      06/19/2023    2:34 PM 05/16/2022    9:42 AM 03/22/2021   11:08 AM 03/18/2021    2:37 PM 12/07/2020    2:07 PM  Depression screen PHQ 2/9  Decreased Interest 0 0 0 0 3  Down, Depressed, Hopeless 0 0  0 3  PHQ - 2 Score 0 0 0 0 6  Altered sleeping 0 0   0  Tired, decreased energy 0 0   0  Change in appetite 0 0   0  Feeling bad or failure about yourself  0 0   0  Trouble concentrating 0 0   1  Moving slowly or fidgety/restless 0 0   0  Suicidal thoughts 0 0   0  PHQ-9 Score 0 0   7  Difficult doing work/chores Not difficult at all Not difficult at all          06/19/2023    2:34 PM 05/16/2022    9:43 AM 01/04/2021    2:08 PM 11/15/2020    2:29 PM  GAD 7 :  Generalized Anxiety Score  Nervous, Anxious, on Edge 0 0 0 0  Control/stop worrying 0 0 0 0  Worry too much - different things 0 1 0 0  Trouble relaxing 0 2 0 0  Restless 0 0 0 0  Easily annoyed or irritable 0 0 1 0  Afraid - awful might happen 0 0 0 0  Total GAD 7 Score  0 3 1 0  Anxiety Difficulty Not difficult at all Not difficult at all Not difficult at all    Outpatient Encounter Medications as of 06/19/2023  Medication Sig   aspirin EC 81 MG tablet Take 1 tablet (81 mg total) by mouth daily. Swallow whole.   clopidogrel (PLAVIX) 75 MG tablet Take 1 tablet (75 mg total) by mouth daily for 21 days.   [employer] D3 25 MCG (1000 UT) capsule TAKE 2 CAPSULES (2,000 UNITS TOTAL) BY MOUTH DAILY.   cyanocobalamin 1000 MCG tablet Take 1 tablet (1,000 mcg total) by mouth daily.   Melatonin 10 MG TABS Take 10 mg by mouth at bedtime.   [DISCONTINUED] amLODipine (NORVASC) 10 MG tablet Take 1 tablet (10 mg total) by mouth daily.   [DISCONTINUED] atorvastatin (LIPITOR) 80 MG tablet Take 1 tablet (80 mg total) by mouth daily.   [DISCONTINUED] citalopram (CELEXA) 20 MG tablet Take 1 tablet (20 mg total) by mouth daily.   [DISCONTINUED] gabapentin (NEURONTIN) 300 MG capsule TAKE 1 CAPSULE BY MOUTH EVERYDAY AT BEDTIME   [DISCONTINUED] metoprolol tartrate (LOPRESSOR) 25 MG tablet Take 1 tablet (25 mg total) by mouth 2 (two) times daily.   [DISCONTINUED] pantoprazole (PROTONIX) 40 MG tablet Take 1 tablet (40 mg total) by mouth daily.   [DISCONTINUED] tamsulosin (FLOMAX) 0.4 MG CAPS capsule Take 1 capsule (0.4 mg total) by mouth daily.   amLODipine (NORVASC) 10 MG tablet Take 1 tablet (10 mg total) by mouth daily.   atorvastatin (LIPITOR) 80 MG tablet Take 1 tablet (80 mg total) by mouth daily.   citalopram (CELEXA) 20 MG tablet Take 1 tablet (20 mg total) by mouth daily.   gabapentin (NEURONTIN) 300 MG capsule Take 1 capsule (300 mg total) by mouth at bedtime.   metoprolol tartrate (LOPRESSOR) 25 MG tablet Take  1 tablet (25 mg total) by mouth 2 (two) times daily.   pantoprazole (PROTONIX) 40 MG tablet Take 1 tablet (40 mg total) by mouth daily.   tamsulosin (FLOMAX) 0.4 MG CAPS capsule Take 1 capsule (0.4 mg total) by mouth daily.   No facility-administered encounter medications on file as of 06/19/2023.    Diagnostic Tests Reviewed/Disposition:      Latest Ref Rng & Units 06/07/2023    5:00 AM 06/06/2023    4:29 AM 06/05/2023    4:44 AM  CBC  WBC 4.0 - 10.5 K/uL 7.9  11.0  9.5   Hemoglobin 13.0 - 17.0 g/dL 64.3  32.9  51.8   Hematocrit 39.0 - 52.0 % 37.6  39.6  37.2   Platelets 150 - 400 K/uL 330  285  261        Latest Ref Rng & Units 06/08/2023    5:49 AM 06/07/2023    5:00 AM 06/06/2023    4:29 AM  CMP  Glucose 70 - 99 mg/dL 841  660  97   BUN 8 - 23 mg/dL 5  5  <5   Creatinine 6.30 - 1.24 mg/dL 1.60  1.09  3.23   Sodium 135 - 145 mmol/L 137  140  137   Potassium 3.5 - 5.1 mmol/L 3.2  3.5  3.1   Chloride 98 - 111 mmol/L 103  108  104   CO2 22 - 32 mmol/L 26  24  25    Calcium 8.9 - 10.3 mg/dL 7.8  8.0  7.9   Total Protein 6.5 - 8.1 g/dL  5.2  5.2   Total Bilirubin 0.3 - 1.2 mg/dL  0.7  0.7   Alkaline Phos 38 - 126 U/L  85  98   AST 15 - 41 U/L  13  17   ALT 0 - 44 U/L  19  25     Consults: Neurology  Discharge Instructions: Follow-up with PCP  Disease/illness Education: Reviewed with patient at bedside  Home Health/Community Services Discussions/Referrals: none at present  Establishment or re-establishment of referral orders for community resources: will place neurology referral  Discussion with other health care providers: Reviewed all recent notes  Assessment and Support of treatment regimen adherence: Reviewed with patient at bedside  Appointments Coordinated with: Reviewed with patient at bedside and reviewed with brother  Education for self-management, independent living, and ADLs: Reviewed with patient at bedside   Relevant past medical, surgical, family and  social history reviewed and updated as indicated. Interim medical history since our last visit reviewed. Allergies and medications reviewed and updated.  Review of Systems  Constitutional:  Positive for appetite change and fatigue (a little bit). Negative for activity change, diaphoresis and fever.  Respiratory:  Negative for cough, shortness of breath and wheezing.   Cardiovascular:  Negative for chest pain, palpitations and leg swelling.  Gastrointestinal:  Negative for abdominal distention, abdominal pain, constipation, diarrhea, nausea and vomiting.  Neurological:  Negative for dizziness, tremors, syncope, facial asymmetry, weakness, numbness and headaches.  Psychiatric/Behavioral: Negative.     Per HPI unless specifically indicated above     Objective:    BP 108/60 (BP Location: Left Arm, Patient Position: Sitting)   Pulse 73   Temp 98 F (36.7 C) (Oral)   Resp 18   Ht 5\' 6"  (1.676 m)   Wt 138 lb 6.4 oz (62.8 kg)   SpO2 97%   BMI 22.34 kg/m   Wt Readings from Last 3 Encounters:  06/19/23 138 lb 6.4 oz (62.8 kg)  06/04/23 147 lb 14.9 oz (67.1 kg)  05/17/23 148 lb (67.1 kg)    Physical Exam Vitals and nursing note reviewed.  Constitutional:      General: He is awake. He is not in acute distress.    Appearance: He is well-developed and well-groomed. He is not ill-appearing or toxic-appearing.  HENT:     Head: Normocephalic.     Right Ear: Hearing and external ear normal.     Left Ear: Hearing and external ear normal.  Eyes:     General: Lids are normal.     Extraocular Movements: Extraocular movements intact.     Conjunctiva/sclera: Conjunctivae normal.  Neck:     Thyroid: No thyromegaly.     Vascular: No carotid bruit.  Cardiovascular:     Rate and Rhythm: Normal rate and regular rhythm.     Heart sounds: Normal heart sounds. No murmur heard.    No gallop.  Pulmonary:     Effort: No accessory muscle usage or respiratory distress.     Breath sounds: Normal  breath sounds.  Abdominal:     General: Bowel sounds are normal. There is no distension.     Palpations: Abdomen is soft.     Tenderness: There is no abdominal tenderness.  Musculoskeletal:     Cervical back: Full passive range of motion without pain.     Right lower leg: No edema.     Left lower leg: No edema.  Lymphadenopathy:     Cervical: No cervical adenopathy.  Skin:    General: Skin is warm.     Capillary Refill: Capillary refill takes less than 2 seconds.  Comments: Multiple bruises to upper extremities.  Neurological:     Mental Status: He is alert.     Deep Tendon Reflexes: Reflexes are normal and symmetric.     Reflex Scores:      Brachioradialis reflexes are 2+ on the right side and 2+ on the left side.      Patellar reflexes are 2+ on the right side and 2+ on the left side.    Comments: Oriented to month and day of week, but reports year as 2014 and date as 7th.  Is able to state Mark [name] is president.  Psychiatric:        Attention and Perception: Attention normal.        Mood and Affect: Mood normal.        Speech: Speech normal.        Behavior: Behavior normal. Behavior is cooperative.        Thought Content: Thought content normal.    Results for orders placed or performed during the hospital encounter of 06/03/23  Resp panel by RT-PCR (RSV, Flu A&B, Covid) Anterior Nasal Swab   Specimen: Anterior Nasal Swab  Result Value Ref Range   SARS Coronavirus 2 by RT PCR NEGATIVE NEGATIVE   Influenza A by PCR NEGATIVE NEGATIVE   Influenza B by PCR NEGATIVE NEGATIVE   Resp Syncytial Virus by PCR NEGATIVE NEGATIVE  Blood Culture (routine x 2)   Specimen: BLOOD  Result Value Ref Range   Specimen Description BLOOD BLOOD LEFT ARM    Special Requests      BOTTLES DRAWN AEROBIC AND ANAEROBIC Blood Culture adequate volume   Culture      NO GROWTH 9 DAYS Performed at [facility], [address] [place], [place] [address]    Report Status 06/13/2023 FINAL    Blood Culture (routine x 2)   Specimen: BLOOD  Result Value Ref Range   Specimen Description BLOOD BLOOD RIGHT ARM    Special Requests      BOTTLES DRAWN AEROBIC AND ANAEROBIC Blood Culture adequate volume   Culture      NO GROWTH 9 DAYS Performed at [facility], [address] [place], [place] [address]    Report Status 06/13/2023 FINAL   CBC with Differential  Result Value Ref Range   WBC 16.2 (H) 4.0 - 10.5 K/uL   RBC 5.05 4.22 - 5.81 MIL/uL   Hemoglobin 15.5 13.0 - 17.0 g/dL   HCT 09.8 11.9 - 14.7 %   MCV 93.1 80.0 - 100.0 fL   MCH 30.7 26.0 - 34.0 pg   MCHC 33.0 30.0 - 36.0 g/dL   RDW 82.9 56.2 - 13.0 %   Platelets 326 150 - 400 K/uL   nRBC 0.0 0.0 - 0.2 %   Neutrophils Relative % 61 %   Neutro Abs 10.0 (H) 1.7 - 7.7 K/uL   Lymphocytes Relative 24 %   Lymphs Abs 3.9 0.7 - 4.0 K/uL   Monocytes Relative 12 %   Monocytes Absolute 1.9 (H) 0.1 - 1.0 K/uL   Eosinophils Relative 2 %   Eosinophils Absolute 0.3 0.0 - 0.5 K/uL   Basophils Relative 1 %   Basophils Absolute 0.1 0.0 - 0.1 K/uL   Immature Granulocytes 0 %   Abs Immature Granulocytes 0.06 0.00 - 0.07 K/uL  Comprehensive metabolic panel  Result Value Ref Range   Sodium 135 135 - 145 mmol/L   Potassium 3.8 3.5 - 5.1 mmol/L   Chloride 99 98 - 111  mmol/L   CO2 25 22 - 32 mmol/L   Glucose, Bld 112 (H) 70 - 99 mg/dL   BUN 11 8 - 23 mg/dL   Creatinine, Ser 7.56 0.61 - 1.24 mg/dL   Calcium 9.0 8.9 - 43.3 mg/dL   Total Protein 7.4 6.5 - 8.1 g/dL   Albumin 4.0 3.5 - 5.0 g/dL   AST 32 15 - 41 U/L   ALT 47 (H) 0 - 44 U/L   Alkaline Phosphatase 175 (H) 38 - 126 U/L   Total Bilirubin 0.6 0.3 - 1.2 mg/dL   GFR, Estimated >29 >51 mL/min   Anion gap 11 5 - 15  Lactic acid, plasma  Result Value Ref Range   Lactic Acid, Venous 1.6 0.5 - 1.9 mmol/L  Protime-INR  Result Value Ref Range   Prothrombin Time 14.0 11.4 - 15.2 seconds   INR 1.1 0.8 - 1.2  APTT  Result Value Ref Range   aPTT 31 24 - 36 seconds   Urinalysis, w/ Reflex to Culture (Infection Suspected) -Urine, Clean Catch  Result Value Ref Range   Specimen Source URINE, CLEAN CATCH    Color, Urine YELLOW (A) YELLOW   APPearance CLEAR (A) CLEAR   Specific Gravity, Urine 1.015 1.005 - 1.030   pH 5.0 5.0 - 8.0   Glucose, UA NEGATIVE NEGATIVE mg/dL   Hgb urine dipstick NEGATIVE NEGATIVE   Bilirubin Urine NEGATIVE NEGATIVE   Ketones, ur NEGATIVE NEGATIVE mg/dL   Protein, ur NEGATIVE NEGATIVE mg/dL   Nitrite NEGATIVE NEGATIVE   Leukocytes,Ua NEGATIVE NEGATIVE   RBC / HPF 0-5 0 - 5 RBC/hpf   WBC, UA 0-5 0 - 5 WBC/hpf   Bacteria, UA NONE SEEN NONE SEEN   Squamous Epithelial / HPF 0 0 - 5 /HPF   Mucus PRESENT    Hyaline Casts, UA PRESENT   Ethanol  Result Value Ref Range   Alcohol, Ethyl (B) <10 <10 mg/dL  Urine Drug Screen, Qualitative ([facility] only)  Result Value Ref Range   Tricyclic, Ur Screen NONE DETECTED NONE DETECTED   Amphetamines, Ur Screen NONE DETECTED NONE DETECTED   MDMA (Ecstasy)Ur Screen NONE DETECTED NONE DETECTED   Cocaine Metabolite,Ur [place] NONE DETECTED NONE DETECTED   Opiate, Ur Screen NONE DETECTED NONE DETECTED   Phencyclidine (PCP) Ur S NONE DETECTED NONE DETECTED   Cannabinoid 50 Ng, Ur [place] NONE DETECTED NONE DETECTED   Barbiturates, Ur Screen NONE DETECTED NONE DETECTED   Benzodiazepine, Ur Scrn NONE DETECTED NONE DETECTED   Methadone Scn, Ur NONE DETECTED NONE DETECTED  HIV Antibody (routine testing w rflx)  Result Value Ref Range   HIV Screen 4th Generation wRfx Non Reactive Non Reactive  Protime-INR  Result Value Ref Range   Prothrombin Time 14.3 11.4 - 15.2 seconds   INR 1.1 0.8 - 1.2  Cortisol-am, blood  Result Value Ref Range   Cortisol - AM 10.3 6.7 - 22.6 ug/dL  Procalcitonin  Result Value Ref Range   Procalcitonin 0.29 ng/mL  Basic metabolic panel  Result Value Ref Range   Sodium 135 135 - 145 mmol/L   Potassium 3.5 3.5 - 5.1 mmol/L   Chloride 107 98 - 111 mmol/L   CO2 21 (L) 22 - 32  mmol/L   Glucose, Bld 105 (H) 70 - 99 mg/dL   BUN 8 8 - 23 mg/dL   Creatinine, Ser 8.84 0.61 - 1.24 mg/dL   Calcium 7.8 (L) 8.9 - 10.3 mg/dL   GFR, Estimated >16 >60 mL/min  Anion gap 7 5 - 15  CBC  Result Value Ref Range   WBC 12.6 (H) 4.0 - 10.5 K/uL   RBC 4.02 (L) 4.22 - 5.81 MIL/uL   Hemoglobin 12.4 (L) 13.0 - 17.0 g/dL   HCT 96.0 (L) 45.4 - 09.8 %   MCV 95.3 80.0 - 100.0 fL   MCH 30.8 26.0 - 34.0 pg   MCHC 32.4 30.0 - 36.0 g/dL   RDW 11.9 14.7 - 82.9 %   Platelets 223 150 - 400 K/uL   nRBC 0.0 0.0 - 0.2 %  Lipase, blood  Result Value Ref Range   Lipase 23 11 - 51 U/L  Comprehensive metabolic panel  Result Value Ref Range   Sodium 136 135 - 145 mmol/L   Potassium 3.8 3.5 - 5.1 mmol/L   Chloride 106 98 - 111 mmol/L   CO2 23 22 - 32 mmol/L   Glucose, Bld 101 (H) 70 - 99 mg/dL   BUN 6 (L) 8 - 23 mg/dL   Creatinine, Ser 5.62 0.61 - 1.24 mg/dL   Calcium 7.8 (L) 8.9 - 10.3 mg/dL   Total Protein 5.2 (L) 6.5 - 8.1 g/dL   Albumin 2.7 (L) 3.5 - 5.0 g/dL   AST 28 15 - 41 U/L   ALT 31 0 - 44 U/L   Alkaline Phosphatase 103 38 - 126 U/L   Total Bilirubin 0.8 0.3 - 1.2 mg/dL   GFR, Estimated >13 >08 mL/min   Anion gap 7 5 - 15  CBC  Result Value Ref Range   WBC 9.5 4.0 - 10.5 K/uL   RBC 4.03 (L) 4.22 - 5.81 MIL/uL   Hemoglobin 12.5 (L) 13.0 - 17.0 g/dL   HCT 65.7 (L) 84.6 - 96.2 %   MCV 92.3 80.0 - 100.0 fL   MCH 31.0 26.0 - 34.0 pg   MCHC 33.6 30.0 - 36.0 g/dL   RDW 95.2 84.1 - 32.4 %   Platelets 261 150 - 400 K/uL   nRBC 0.0 0.0 - 0.2 %  Lipase, blood  Result Value Ref Range   Lipase  11 - 51 U/L    QUESTIONABLE RESULTS, RECOMMEND RECOLLECT TO VERIFY  Comprehensive metabolic panel  Result Value Ref Range   Sodium 137 135 - 145 mmol/L   Potassium 3.1 (L) 3.5 - 5.1 mmol/L   Chloride 104 98 - 111 mmol/L   CO2 25 22 - 32 mmol/L   Glucose, Bld 97 70 - 99 mg/dL   BUN <5 (L) 8 - 23 mg/dL   Creatinine, Ser 4.01 0.61 - 1.24 mg/dL   Calcium 7.9 (L) 8.9 - 10.3 mg/dL    Total Protein 5.2 (L) 6.5 - 8.1 g/dL   Albumin 2.7 (L) 3.5 - 5.0 g/dL   AST 17 15 - 41 U/L   ALT 25 0 - 44 U/L   Alkaline Phosphatase 98 38 - 126 U/L   Total Bilirubin 0.7 0.3 - 1.2 mg/dL   GFR, Estimated >02 >72 mL/min   Anion gap 8 5 - 15  CBC  Result Value Ref Range   WBC 11.0 (H) 4.0 - 10.5 K/uL   RBC 4.31 4.22 - 5.81 MIL/uL   Hemoglobin 13.4 13.0 - 17.0 g/dL   HCT 53.6 64.4 - 03.4 %   MCV 91.9 80.0 - 100.0 fL   MCH 31.1 26.0 - 34.0 pg   MCHC 33.8 30.0 - 36.0 g/dL   RDW 74.2 59.5 - 63.8 %   Platelets 285 150 - 400  K/uL   nRBC 0.0 0.0 - 0.2 %  Lipase, blood  Result Value Ref Range   Lipase 20 11 - 51 U/L  Phosphorus  Result Value Ref Range   Phosphorus 3.1 2.5 - 4.6 mg/dL  Magnesium  Result Value Ref Range   Magnesium 1.8 1.7 - 2.4 mg/dL  Lipase, blood  Result Value Ref Range   Lipase 22 11 - 51 U/L  Magnesium  Result Value Ref Range   Magnesium 1.7 1.7 - 2.4 mg/dL  CBC  Result Value Ref Range   WBC 7.9 4.0 - 10.5 K/uL   RBC 4.19 (L) 4.22 - 5.81 MIL/uL   Hemoglobin 13.0 13.0 - 17.0 g/dL   HCT 16.1 (L) 09.6 - 04.5 %   MCV 89.7 80.0 - 100.0 fL   MCH 31.0 26.0 - 34.0 pg   MCHC 34.6 30.0 - 36.0 g/dL   RDW 40.9 81.1 - 91.4 %   Platelets 330 150 - 400 K/uL   nRBC 0.0 0.0 - 0.2 %  Comprehensive metabolic panel  Result Value Ref Range   Sodium 140 135 - 145 mmol/L   Potassium 3.5 3.5 - 5.1 mmol/L   Chloride 108 98 - 111 mmol/L   CO2 24 22 - 32 mmol/L   Glucose, Bld 102 (H) 70 - 99 mg/dL   BUN 5 (L) 8 - 23 mg/dL   Creatinine, Ser 7.82 0.61 - 1.24 mg/dL   Calcium 8.0 (L) 8.9 - 10.3 mg/dL   Total Protein 5.2 (L) 6.5 - 8.1 g/dL   Albumin 2.8 (L) 3.5 - 5.0 g/dL   AST 13 (L) 15 - 41 U/L   ALT 19 0 - 44 U/L   Alkaline Phosphatase 85 38 - 126 U/L   Total Bilirubin 0.7 0.3 - 1.2 mg/dL   GFR, Estimated >95 >62 mL/min   Anion gap 8 5 - 15  Hemoglobin A1c  Result Value Ref Range   Hgb A1c MFr Bld 5.6 4.8 - 5.6 %   Mean Plasma Glucose 114.02 mg/dL  Lipase, blood   Result Value Ref Range   Lipase 34 11 - 51 U/L  Magnesium  Result Value Ref Range   Magnesium 2.2 1.7 - 2.4 mg/dL  Basic metabolic panel  Result Value Ref Range   Sodium 137 135 - 145 mmol/L   Potassium 3.2 (L) 3.5 - 5.1 mmol/L   Chloride 103 98 - 111 mmol/L   CO2 26 22 - 32 mmol/L   Glucose, Bld 116 (H) 70 - 99 mg/dL   BUN 5 (L) 8 - 23 mg/dL   Creatinine, Ser 1.30 0.61 - 1.24 mg/dL   Calcium 7.8 (L) 8.9 - 10.3 mg/dL   GFR, Estimated >86 >57 mL/min   Anion gap 8 5 - 15  LDL cholesterol, direct  Result Value Ref Range   Direct LDL 12 0 - 99 mg/dL  ECHOCARDIOGRAM COMPLETE BUBBLE STUDY  Result Value Ref Range   Ao pk vel 1.26 m/s   AV Area VTI 2.14 cm2   AR max vel 1.92 cm2   AV Mean grad 2.0 mmHg   AV Peak grad 6.4 mmHg   S' Lateral 2.70 cm   AV Area mean vel 2.09 cm2   Area-P 1/2 3.31 cm2   MV VTI 2.28 cm2   Est EF 60 - 65%   Surgical pathology  Result Value Ref Range   SURGICAL PATHOLOGY      SURGICAL PATHOLOGY [facility] [address], [address] [place],  [place] [address] Telephone [phone] or [phone] Fax [phone]  REPORT OF SURGICAL PATHOLOGY   Accession #: [id number] Patient Name: Mark [name], Mark [name] Visit # : [id number]  MRN: [id number] Physician: Midge Minium DOB/Age [birth year] (Age: 12) Gender: M Collected Date: 06/05/2023 Received Date: 06/05/2023  FINAL DIAGNOSIS       1. Duodenum, Biopsy, CBX ampulla :       - SUPERFICIAL FRAGMENTS OF SMALL BOWEL EPITHELIUM WITH REACTIVE CHANGES AND      FOCAL CONGESTION      - SEE NOTE       Diagnosis Note : The patient's MRCP findings of biliary and pancreatic duct      dilatation with possible ampullary soft tissue fullness are noted.  This limited      biopsy sample is negative for dysplasia or malignancy.  There is no significant      increase in intraepithelial lymphocytes.  Clinical and radiological correlation      recommended.      DATE SIGNED OUT: 10/23/  2024 ELECTRONIC SIGNATURE : Corey Harold M.D., Dossie Arbour., Pathologist, Electronic Signature  MICROSCOPIC DESCRIPTION  CASE COMMENTS STAINS USED IN DIAGNOSIS: H&E    CLINICAL HISTORY  SPECIMEN(S) OBTAINED 1. Duodenum, Biopsy, CBX Ampulla  SPECIMEN COMMENTS: SPECIMEN CLINICAL INFORMATION: 1. Cholangitis    Gross Description 1. Received in formalin, labeled "CBX ampulla", is a 0.2 x 0.2 x <0.1 cm aggregate of friable soft tissue fragments submitted entirely in block 1A. Please note that the specimen is delicate and may not survive processing.      SMB      06/05/2023        Report signed out from the following location(s) [place] [address], [place] [address] CLIA #: [id number]  [facility] [address] [place], [place] [address] CLIA #: [id number]   Troponin I (High Sensitivity)  Result Value Ref Range   Troponin I (High Sensitivity) 6 <18 ng/L  Troponin I (High Sensitivity)  Result Value Ref Range   Troponin I (High Sensitivity) 5 <18 ng/L      Assessment & Plan:   Problem List Items Addressed This Visit       Cardiovascular and Mediastinum   Hypotension    History of elevations, some low levels recently and in office today.  No symptoms reported.  Monitor closely and may need to reduce Amlodipine next visit.  Recommend compression hose daily at home, off at night and ensure good water intake.      Relevant Medications   atorvastatin (LIPITOR) 80 MG tablet   amLODipine (NORVASC) 10 MG tablet   metoprolol tartrate (LOPRESSOR) 25 MG tablet     Digestive   Common bile duct stone    Acute with no further symptoms since discharge.  Will recheck labs today.        Nervous and Auditory   Acute metabolic encephalopathy    Acute while in hospital, improved at this time.  Obtain CBC and CMP today.      Cerebral atrophy (HCC)    Noted on CT imaging in ER 01/18/23 with history of CVA x 2 in 2014 and history  of heavier alcohol use + opioid use.  Educated him on this and discussed we would monitor memory closely.  Ongoing memory changes since CVAs, recent one in hospital.  Noting some decline in memory today.  Referral to neurology placed.  Return in December and if ongoing start Aricept.  Relevant Orders   Ambulatory referral to Neurology   Nicotine dependence, cigarettes, w unsp disorders    I have recommended complete cessation of tobacco use. I have discussed various options available for assistance with tobacco cessation including over the counter methods (Nicotine gum, patch and lozenges). We also discussed prescription options (Chantix, Nicotine Inhaler / Nasal Spray). The patient is not interested in pursuing any prescription tobacco cessation options at this time.  Recommend he continue yearly lung screening.         Other   Cognitive impairment    Some progression noted today.  6CIT 16.  Ongoing memory changes since CVAs, recent one in hospital.  Noting some decline in memory today.  Referral to neurology placed.  Return in December and if ongoing start Aricept.      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  Continue ASA and to monitor lipid panel. Goal A1c <6.5%, BP <130/80, and LDL <70 for prevention.        Relevant Orders   Ambulatory referral to Neurology   History of opioid abuse [facility])    Denies any recent use, monitor closely.  Continue current Gabapentin for pain and avoid opioids.      Hyperlipidemia LDL goal <70    Chronic, ongoing.  Continue current medication regimen and adjust as needed.  Lipid panel next visit with Atorvastatin change.      Relevant Medications   atorvastatin (LIPITOR) 80 MG tablet   amLODipine (NORVASC) 10 MG tablet   metoprolol tartrate (LOPRESSOR) 25 MG tablet   Sepsis due to undetermined organism (HCC) - Primary    Acute and improved at this time.  Check CBC and CMP today.      Relevant Orders   CBC  with Differential/Platelet   Comprehensive metabolic panel     Follow up plan: Return for as scheduled in December.

## 2023-06-19 NOTE — Assessment & Plan Note (Signed)
H/O x 3 CVA, recent on 01/18/23.  Continue ASA and to monitor lipid panel. Goal A1c <6.5%, BP <130/80, and LDL <70 for prevention.

## 2023-06-19 NOTE — Assessment & Plan Note (Addendum)
Some progression noted today.  6CIT 16.  Ongoing memory changes since CVAs, recent one in hospital.  Noting some decline in memory today.  Referral to neurology placed.  Return in December and if ongoing start Aricept.

## 2023-06-19 NOTE — Assessment & Plan Note (Signed)
Acute with no further symptoms since discharge.  Will recheck labs today.

## 2023-06-19 NOTE — Assessment & Plan Note (Addendum)
Chronic, ongoing.  Continue current medication regimen and adjust as needed.  Lipid panel next visit with Atorvastatin change.

## 2023-06-19 NOTE — Assessment & Plan Note (Signed)
Denies any recent use, monitor closely.  Continue current Gabapentin for pain and avoid opioids.

## 2023-06-20 LAB — COMPREHENSIVE METABOLIC PANEL
ALT: 16 [IU]/L (ref 0–44)
AST: 22 [IU]/L (ref 0–40)
Albumin: 3.7 g/dL — ABNORMAL LOW (ref 3.9–4.9)
Alkaline Phosphatase: 70 [IU]/L (ref 44–121)
BUN/Creatinine Ratio: 7 — ABNORMAL LOW (ref 10–24)
BUN: 7 mg/dL — ABNORMAL LOW (ref 8–27)
Bilirubin Total: 0.5 mg/dL (ref 0.0–1.2)
CO2: 26 mmol/L (ref 20–29)
Calcium: 9.4 mg/dL (ref 8.6–10.2)
Chloride: 99 mmol/L (ref 96–106)
Creatinine, Ser: 0.95 mg/dL (ref 0.76–1.27)
Globulin, Total: 2.7 g/dL (ref 1.5–4.5)
Glucose: 112 mg/dL — ABNORMAL HIGH (ref 70–99)
Potassium: 3.9 mmol/L (ref 3.5–5.2)
Sodium: 140 mmol/L (ref 134–144)
Total Protein: 6.4 g/dL (ref 6.0–8.5)
eGFR: 90 mL/min/{1.73_m2} (ref >59)

## 2023-06-20 LAB — CBC WITH DIFFERENTIAL/PLATELET
Basophils Absolute: 0.1 10*3/uL (ref 0.0–0.2)
Basos: 1 %
EOS (ABSOLUTE): 0 10*3/uL (ref 0.0–0.4)
Eos: 0 %
Hematocrit: 42.8 % (ref 37.5–51.0)
Hemoglobin: 14.1 g/dL (ref 13.0–17.7)
Immature Grans (Abs): 0 10*3/uL (ref 0.0–0.1)
Immature Granulocytes: 0 %
Lymphocytes Absolute: 2.3 10*3/uL (ref 0.7–3.1)
Lymphs: 27 %
MCH: 31.5 pg (ref 26.6–33.0)
MCHC: 32.9 g/dL (ref 31.5–35.7)
MCV: 96 fL (ref 79–97)
Monocytes Absolute: 0.5 10*3/uL (ref 0.1–0.9)
Monocytes: 6 %
Neutrophils Absolute: 5.5 10*3/uL (ref 1.4–7.0)
Neutrophils: 66 %
Platelets: 458 10*3/uL — ABNORMAL HIGH (ref 150–450)
RBC: 4.48 x10E6/uL (ref 4.14–5.80)
RDW: 13.7 % (ref 11.6–15.4)
WBC: 8.5 10*3/uL (ref 3.4–10.8)

## 2023-06-20 NOTE — Progress Notes (Signed)
Good morning, please let Mark [name] know his labs have returned: - CBC is stable with no anemia or infection.  Although his platelet count is a little elevated and we will watch this at visits. - Kidney and liver function are normal.  Any questions? Keep being stellar!!  Thank you for allowing me to participate in your care.  I appreciate you. Kindest regards, Deisha Stull

## 2023-06-21 ENCOUNTER — Other Ambulatory Visit: Payer: Self-pay | Admitting: Nurse Practitioner

## 2023-06-21 NOTE — Telephone Encounter (Signed)
Requested medications are due for refill today.  Unsure  Requested medications are on the active medications list.  no  Last refill. never  Future visit scheduled.   yes  Notes to clinic.  Med has not been prescribed for pt.     Requested Prescriptions  Pending Prescriptions Disp Refills   ferrous sulfate 325 (65 FE) MG EC tablet [Pharmacy Med Name: FERROUS SULF EC 325 MG TABLET] 90 tablet 4    Sig: TAKE 1 TABLET BY MOUTH EVERY DAY WITH BREAKFAST     Endocrinology:  Minerals - Iron Supplementation Failed - 06/21/2023  8:59 AM      Failed - Fe (serum) in normal range and within 360 days    Iron  Date Value Ref Range Status  09/15/2020 42 38 - 169 ug/dL Final   Iron Saturation  Date Value Ref Range Status  09/15/2020 14 (L) 15 - 55 % Final         Failed - Ferritin in normal range and within 360 days    Ferritin  Date Value Ref Range Status  09/15/2020 60 30 - 400 ng/mL Final         Passed - HGB in normal range and within 360 days    Hemoglobin  Date Value Ref Range Status  06/19/2023 14.1 13.0 - 17.7 g/dL Final         Passed - HCT in normal range and within 360 days    Hematocrit  Date Value Ref Range Status  06/19/2023 42.8 37.5 - 51.0 % Final         Passed - RBC in normal range and within 360 days    RBC  Date Value Ref Range Status  06/19/2023 4.48 4.14 - 5.80 x10E6/uL Final  06/07/2023 4.19 (L) 4.22 - 5.81 MIL/uL Final         Passed - Valid encounter within last 12 months    Recent Outpatient Visits           2 days ago Sepsis due to undetermined organism (HCC)   [place] [facility] [place], Towanda T, NP   1 month ago Cognitive impairment   [place] [facility] Mecum, Oswaldo Conroy, PA-C   4 months ago Cerebral atrophy (HCC)   [place] [facility] [place], Dorie Rank, NP   11 months ago Encounter for [employer] annual wellness exam   [place] [facility] [place], Corrie Dandy T, NP   1 year ago  History of opioid abuse (HCC)   [place] [facility] [place], Dorie Rank, NP       Future Appointments             In 1 month [place], Dorie Rank, NP [facility]

## 2023-06-29 ENCOUNTER — Encounter: Payer: Self-pay | Admitting: Nurse Practitioner

## 2023-06-30 ENCOUNTER — Emergency Department
Admission: EM | Admit: 2023-06-30 | Discharge: 2023-06-30 | Disposition: A | Discharge status: Home / Self Care | Payer: Medicare PPO | Attending: Emergency Medicine | Admitting: Emergency Medicine

## 2023-06-30 ENCOUNTER — Emergency Department: Payer: Medicare PPO

## 2023-06-30 ENCOUNTER — Other Ambulatory Visit: Payer: Self-pay

## 2023-06-30 DIAGNOSIS — R109 Unspecified abdominal pain: Secondary | ICD-10-CM

## 2023-06-30 DIAGNOSIS — R1031 Right lower quadrant pain: Secondary | ICD-10-CM | POA: Diagnosis not present

## 2023-06-30 DIAGNOSIS — N3289 Other specified disorders of bladder: Secondary | ICD-10-CM | POA: Diagnosis not present

## 2023-06-30 DIAGNOSIS — I10 Essential (primary) hypertension: Secondary | ICD-10-CM | POA: Diagnosis not present

## 2023-06-30 DIAGNOSIS — Z8673 Personal history of transient ischemic attack (TIA), and cerebral infarction without residual deficits: Secondary | ICD-10-CM | POA: Diagnosis not present

## 2023-06-30 LAB — CBC
HCT: 43 % (ref 39.0–52.0)
Hemoglobin: 14.6 g/dL (ref 13.0–17.0)
MCH: 31.3 pg (ref 26.0–34.0)
MCHC: 34 g/dL (ref 30.0–36.0)
MCV: 92.3 fL (ref 80.0–100.0)
Platelets: 388 10*3/uL (ref 150–400)
RBC: 4.66 MIL/uL (ref 4.22–5.81)
RDW: 14.3 % (ref 11.5–15.5)
WBC: 11.1 10*3/uL — ABNORMAL HIGH (ref 4.0–10.5)
nRBC: 0 % (ref 0.0–0.2)

## 2023-06-30 LAB — COMPREHENSIVE METABOLIC PANEL
ALT: 17 U/L (ref 0–44)
AST: 18 U/L (ref 15–41)
Albumin: 3.5 g/dL (ref 3.5–5.0)
Alkaline Phosphatase: 58 U/L (ref 38–126)
Anion gap: 10 (ref 5–15)
BUN: 8 mg/dL (ref 8–23)
CO2: 21 mmol/L — ABNORMAL LOW (ref 22–32)
Calcium: 8.4 mg/dL — ABNORMAL LOW (ref 8.9–10.3)
Chloride: 105 mmol/L (ref 98–111)
Creatinine, Ser: 0.89 mg/dL (ref 0.61–1.24)
GFR, Estimated: >60 mL/min (ref >60)
Glucose, Bld: 113 mg/dL — ABNORMAL HIGH (ref 70–99)
Potassium: 3.4 mmol/L — ABNORMAL LOW (ref 3.5–5.1)
Sodium: 136 mmol/L (ref 135–145)
Total Bilirubin: 0.5 mg/dL (ref <1.2)
Total Protein: 6.6 g/dL (ref 6.5–8.1)

## 2023-06-30 LAB — LIPASE, BLOOD: Lipase: 26 U/L (ref 11–51)

## 2023-06-30 MED ORDER — FAMOTIDINE 20 MG PO TABS
40.0000 mg | ORAL_TABLET | Freq: Once | ORAL | Status: AC
  Administered 2023-06-30: 40 mg via ORAL
  Filled 2023-06-30: qty 2

## 2023-06-30 MED ORDER — ALUM & MAG HYDROXIDE-SIMETH 200-200-20 MG/5ML PO SUSP
30.0000 mL | Freq: Once | ORAL | Status: AC
  Administered 2023-06-30: 30 mL via ORAL
  Filled 2023-06-30: qty 30

## 2023-06-30 MED ORDER — OXYCODONE-ACETAMINOPHEN 5-325 MG PO TABS
1.0000 | ORAL_TABLET | ORAL | Status: DC | PRN
  Administered 2023-06-30: 1 via ORAL
  Filled 2023-06-30: qty 1

## 2023-06-30 MED ORDER — ONDANSETRON 4 MG PO TBDP
4.0000 mg | ORAL_TABLET | Freq: Once | ORAL | Status: AC | PRN
  Administered 2023-06-30: 4 mg via ORAL

## 2023-06-30 MED ORDER — OXYCODONE-ACETAMINOPHEN 5-325 MG PO TABS
1.0000 | ORAL_TABLET | Freq: Once | ORAL | Status: AC
Start: 2023-06-30 — End: 2023-06-30
  Administered 2023-06-30: 1 via ORAL
  Filled 2023-06-30: qty 1

## 2023-06-30 MED ORDER — IOHEXOL 300 MG/ML  SOLN
100.0000 mL | Freq: Once | INTRAMUSCULAR | Status: AC | PRN
  Administered 2023-06-30: 100 mL via INTRAVENOUS

## 2023-06-30 MED ORDER — KETOROLAC TROMETHAMINE 15 MG/ML IJ SOLN
15.0000 mg | Freq: Once | INTRAMUSCULAR | Status: AC
  Administered 2023-06-30: 15 mg via INTRAVENOUS
  Filled 2023-06-30: qty 1

## 2023-06-30 NOTE — ED Triage Notes (Signed)
Pt to ED via POV from home. Pt reports gallbladder surgery 2wks ago. Pt reports increasing RLQ pain. Pt denies N/V. Pt endorses diarrhea. Pt denies urinary symptoms.

## 2023-06-30 NOTE — ED Provider Notes (Signed)
[facility] Provider Note    Event Date/Time   First MD Initiated Contact with Patient 06/30/23 2035     (approximate)   History   Chief Complaint: Abdominal Pain   HPI  Mark [name] is a 62 y.o. male with a history of hypertension, stroke, bowel obstruction who comes the ED complaining of right lower quadrant abdominal pain, gradually worsening over the past week.  Eating and drinking okay.  No fever.  No black or bloody stool.  Reports gallbladder surgery 2 weeks ago.  Reviewed outside records noting that patient had biliary dilatation, MRCP suggested soft tissue obstruction at the level of the ampulla.  ERCP was performed on 06/14/2023 which was negative for biliary obstruction.  Sphincterotomy was performed, biopsy sent which was negative for malignancy.          Physical Exam   Triage Vital Signs: ED Triage Vitals  Encounter Vitals Group     BP 06/30/23 1626 (!) 126/104     Systolic BP Percentile --      Diastolic BP Percentile --      Pulse Rate 06/30/23 1626 (!) 106     Resp 06/30/23 1626 20     Temp 06/30/23 1626 97.8 F (36.6 C)     Temp Source 06/30/23 1626 Oral     SpO2 06/30/23 1631 98 %     Weight --      Height --      Head Circumference --      Peak Flow --      Pain Score 06/30/23 1626 10     Pain Loc --      Pain Education --      Exclude from Growth Chart --     Most recent vital signs: Vitals:   06/30/23 2200 06/30/23 2230  BP: 116/85 118/85  Pulse: 72 71  Resp: 20 20  Temp:    SpO2: 98% 100%    General: Awake, no distress.  CV:  Good peripheral perfusion.  Resp:  Normal effort.  Abd:  No distention.  Soft with generalized tenderness, most pronounced in right lower quadrant. Other:  Moist oral mucosa   ED Results / Procedures / Treatments   Labs (all labs ordered are listed, but only abnormal results are displayed) Labs Reviewed  COMPREHENSIVE METABOLIC PANEL - Abnormal; Notable for the following  components:      Result Value   Potassium 3.4 (*)    CO2 21 (*)    Glucose, Bld 113 (*)    Calcium 8.4 (*)    All other components within normal limits  CBC - Abnormal; Notable for the following components:   WBC 11.1 (*)    All other components within normal limits  LIPASE, BLOOD  URINALYSIS, ROUTINE W REFLEX MICROSCOPIC     EKG    RADIOLOGY CT ab pelvis interpreted by me, negative for bowel obstruction or free air.  Radiology report reviewed   PROCEDURES:  Procedures   MEDICATIONS ORDERED IN ED: Medications  oxyCODONE-acetaminophen (PERCOCET/ROXICET) 5-325 MG per tablet 1 tablet (1 tablet Oral Given 06/30/23 1631)  ondansetron (ZOFRAN-ODT) disintegrating tablet 4 mg (4 mg Oral Given 06/30/23 1631)  ketorolac (TORADOL) 15 MG/ML injection 15 mg (15 mg Intravenous Given 06/30/23 2114)  iohexol (OMNIPAQUE) 300 MG/ML solution 100 mL (100 mLs Intravenous Contrast Given 06/30/23 2127)  oxyCODONE-acetaminophen (PERCOCET/ROXICET) 5-325 MG per tablet 1 tablet (1 tablet Oral Given 06/30/23 2237)  alum & mag hydroxide-simeth (MAALOX/MYLANTA) 200-200-20 MG/5ML suspension  30 mL (30 mLs Oral Given 06/30/23 2237)  famotidine (PEPCID) tablet 40 mg (40 mg Oral Given 06/30/23 2237)     IMPRESSION / MDM / ASSESSMENT AND PLAN / ED COURSE  I reviewed the triage vital signs and the nursing notes.  DDx: Pancreatitis, gastritis, appendicitis, bowel obstruction, abdominal adhesion disease  Patient's presentation is most consistent with acute presentation with potential threat to life or bodily function.  Patient presents with abdominal pain, has prior abdominal surgeries.  Will obtain CT.   Clinical Course as of 06/30/23 2304  Sat Jun 30, 2023  2224 Labs and CT unremarkable. [PS]    Clinical Course User Index [PS] Sharman Cheek, MD    ----------------------------------------- 11:04 PM on 06/30/2023 ----------------------------------------- Patient feeling better, stable for  discharge   FINAL CLINICAL IMPRESSION(S) / ED DIAGNOSES   Final diagnoses:  Abdominal pain, unspecified abdominal location     Rx / DC Orders   ED Discharge Orders     None        Note:  This document was prepared using Dragon voice recognition software and may include unintentional dictation errors.   Sharman Cheek, MD 06/30/23 [phone]

## 2023-06-30 NOTE — ED Notes (Signed)
Pt up to nurses desk x2 asking about wait time, educated pt that unable to provide wait time. Warm blanket provided.

## 2023-07-19 ENCOUNTER — Telehealth: Payer: Self-pay

## 2023-07-19 NOTE — Telephone Encounter (Signed)
Transition Care Management Follow-up Telephone Call Date of discharge and from where: 06/30/2023 [facility] How have you been since you were released from the hospital? Patient stated he is feeling much better. Any questions or concerns? No  Items Reviewed: Did the pt receive and understand the discharge instructions provided? Yes  Medications obtained and verified?  No medication prescribed this visit. Other? No  Any new allergies since your discharge? No  Dietary orders reviewed? Yes Do you have support at home? Yes   Follow up appointments reviewed:  PCP Hospital f/u appt confirmed? Yes  Scheduled to see Jolene T. Harvest Dark, NP on 07/30/2023 @ [facility]. Specialist Hospital f/u appt confirmed? No  Scheduled to see  on  @ . Are transportation arrangements needed? No  If their condition worsens, is the pt aware to call PCP or go to the Emergency Dept.? Yes Was the patient provided with contact information for the PCP's office or ED? Yes Was to pt encouraged to call back with questions or concerns? Yes   Ayslin Kundert [facility]  [facility], [facility] Guide Direct Dial: [phone]  Website: [email or web address]

## 2023-07-27 ENCOUNTER — Ambulatory Visit: Payer: Medicare PPO | Admitting: Nurse Practitioner

## 2023-07-28 NOTE — Patient Instructions (Signed)

## 2023-07-30 ENCOUNTER — Encounter: Payer: Self-pay | Admitting: Nurse Practitioner

## 2023-07-30 ENCOUNTER — Ambulatory Visit (INDEPENDENT_AMBULATORY_CARE_PROVIDER_SITE_OTHER): Payer: Medicare PPO | Admitting: Nurse Practitioner

## 2023-07-30 VITALS — BP 100/56 | HR 75 | Temp 98.1°F | Resp 16 | Wt 136.5 lb

## 2023-07-30 DIAGNOSIS — Z8673 Personal history of transient ischemic attack (TIA), and cerebral infarction without residual deficits: Secondary | ICD-10-CM

## 2023-07-30 DIAGNOSIS — E785 Hyperlipidemia, unspecified: Secondary | ICD-10-CM | POA: Diagnosis not present

## 2023-07-30 DIAGNOSIS — R4189 Other symptoms and signs involving cognitive functions and awareness: Secondary | ICD-10-CM

## 2023-07-30 DIAGNOSIS — G319 Degenerative disease of nervous system, unspecified: Secondary | ICD-10-CM | POA: Diagnosis not present

## 2023-07-30 DIAGNOSIS — I10 Essential (primary) hypertension: Secondary | ICD-10-CM

## 2023-07-30 DIAGNOSIS — F32A Depression, unspecified: Secondary | ICD-10-CM

## 2023-07-30 DIAGNOSIS — J438 Other emphysema: Secondary | ICD-10-CM | POA: Diagnosis not present

## 2023-07-30 DIAGNOSIS — Z0000 Encounter for general adult medical examination without abnormal findings: Secondary | ICD-10-CM

## 2023-07-30 DIAGNOSIS — I9589 Other hypotension: Secondary | ICD-10-CM | POA: Diagnosis not present

## 2023-07-30 DIAGNOSIS — I69920 Aphasia following unspecified cerebrovascular disease: Secondary | ICD-10-CM

## 2023-07-30 DIAGNOSIS — I700 Atherosclerosis of aorta: Secondary | ICD-10-CM

## 2023-07-30 DIAGNOSIS — F17219 Nicotine dependence, cigarettes, with unspecified nicotine-induced disorders: Secondary | ICD-10-CM | POA: Diagnosis not present

## 2023-07-30 DIAGNOSIS — F419 Anxiety disorder, unspecified: Secondary | ICD-10-CM

## 2023-07-30 MED ORDER — AMLODIPINE BESYLATE 2.5 MG PO TABS: 2.5000 mg | ORAL_TABLET | Freq: Every day | ORAL | 4 refills | Status: DC

## 2023-07-30 MED ORDER — GABAPENTIN 300 MG PO CAPS: 300.0000 mg | ORAL_CAPSULE | Freq: Every day | ORAL | 4 refills | Status: DC

## 2023-07-30 MED ORDER — GABAPENTIN 300 MG PO CAPS: 300.0000 mg | ORAL_CAPSULE | Freq: Three times a day (TID) | ORAL | 4 refills | Status: DC

## 2023-07-30 NOTE — Assessment & Plan Note (Signed)
 H/O x 3 CVA, recent on 01/18/23.  Continue ASA and to monitor lipid panel. Goal A1c <6.5%, BP <130/80, and LDL <70 for prevention.

## 2023-07-30 NOTE — Assessment & Plan Note (Signed)
Noted on CT imaging in ER 01/18/23 with history of CVA x 2 in 2014 and history of heavier alcohol use + opioid use.  Educated him on this and discussed we would monitor memory closely.  Ongoing memory changes since CVAs.  Is scheduled to see neurology in January.

## 2023-07-30 NOTE — Assessment & Plan Note (Signed)
I have recommended complete cessation of tobacco use. I have discussed various options available for assistance with tobacco cessation including over the counter methods (Nicotine gum, patch and lozenges). We also discussed prescription options (Chantix, Nicotine Inhaler / Nasal Spray). The patient is not interested in pursuing any prescription tobacco cessation options at this time.  Recommend he continue yearly lung screening.

## 2023-07-30 NOTE — Assessment & Plan Note (Signed)
Chronic, ongoing.  Continue current medication regimen and adjust as needed.  Lipid panel.

## 2023-07-30 NOTE — Assessment & Plan Note (Addendum)
Chronic, ongoing with BP well below goal. Will reduce Amlodipine to 2.5 MG and monitor closely.  Recommend she monitor BP at least a few mornings a week at home and document.  DASH diet at home.  Continue current medication regimen and adjust as needed.  Labs today: CMP.  Return in 6 months, recommend in 6 weeks but he refuses to come back sooner then 6 months.  Suspect lower BP related to weight loss since surgery.

## 2023-07-30 NOTE — Assessment & Plan Note (Signed)
Chronic, no current inhalers or symptoms.  Long time smoker.  Have recommended complete cessation.  Will plan on spirometry at upcoming visits, refuses today.  Continue yearly lung CT CA screening -- recommend he attend.  Initiate inhaler regimen as needed.

## 2023-07-30 NOTE — Assessment & Plan Note (Signed)
Chronic, stable.  Continue current medication regimen and adjust as needed.  Denies SI/HI. 

## 2023-07-30 NOTE — Assessment & Plan Note (Signed)
History of elevations, but currently remains well below goal.  Will further reduce Amlodipine to 2.5 MG daily and recommend good water intake at home.  Refer to HTN plan of care for further.

## 2023-07-30 NOTE — Progress Notes (Signed)
BP (!) 100/56 (BP Location: Left Arm)   Pulse 75   Temp 98.1 F (36.7 C) (Oral)   Resp 16   Wt 136 lb 8 oz (61.9 kg)   SpO2 99%   BMI 22.03 kg/m    Subjective:    Patient ID: Mark [name], male    DOB: [birth year], 62 y.o.   MRN: [id number]  HPI: Mark [name] is a 62 y.o. male  Chief Complaint  Patient presents with   Follow-up    Had gallbladder surgery, feels good since   Hypertension   Memory Loss    Isn't very good because of the strokes, not gotten worse   Hyperlipidemia   Medication Refill    Gabapentin is pended    HYPERTENSION / HYPERLIPIDEMIA Continues on Amlodipine and Metoprolol daily. Last A1c 5.6% on check, history of elevations.  History of CVA x 2 in 2014. He reports memory is staying the same.  His brother is going to move in with him the first of the year. Satisfied with current treatment? yes Duration of hypertension: chronic BP monitoring frequency: no BP range:  BP medication side effects: no Duration of hyperlipidemia: chronic Cholesterol medication side effects: no Cholesterol supplements: none Medication compliance: good compliance Aspirin: yes Recent stressors: no Recurrent headaches: no Visual changes: no Palpitations: no Dyspnea: no Chest pain: no Lower extremity edema: no Dizzy/lightheaded: no  The ASCVD Risk score (Arnett DK, et al., 2019) failed to calculate for the following reasons:   Risk score cannot be calculated because patient has a medical history suggesting prior/existing ASCVD    06/19/2023    2:19 PM 07/25/2022   11:07 AM 03/18/2021    2:39 PM 05/30/2017    3:18 PM  6CIT Screen  What Year? 4 points 0 points 0 points 0 points  What month? 0 points 0 points 0 points 0 points  What time? 0 points 0 points 0 points 0 points  Count back from 20 4 points 2 points 2 points 0 points  Months in reverse 4 points 2 points 4 points 0 points  Repeat phrase 4 points 6 points 6 points 2 points  Total Score 16 points 10  points 12 points 2 points   COPD Had lung screening September 2021 -- noted emphysema and aortic atherosclerosis + a 2.8 cm left lower lobe nodule -- to continue yearly screening, has missed >2 years. Smokes about 1 PPD, has been smoking since age 22.  No current inhalers or symptoms.   COPD status: stable Satisfied with current treatment?: yes Oxygen use: no Dyspnea frequency: none Cough frequency: none Rescue inhaler frequency:  none Limitation of activity: no Productive cough: none Last Spirometry: none, refuses Pneumovax: Not up to Date, refuses Influenza: refuses  DEPRESSION Continues on Celexa and Gabapentin. Mood status: stable Satisfied with current treatment?: yes Symptom severity: mild  Duration of current treatment : chronic Side effects: no Medication compliance: good compliance Psychotherapy/counseling: none Depressed mood: no Anxious mood: no Anhedonia: no Significant weight loss or gain: no Insomnia: none Fatigue: no Feelings of worthlessness or guilt: no Impaired concentration/indecisiveness: no Suicidal ideations: no Hopelessness: no Crying spells: no    07/30/2023    1:23 PM 06/19/2023    2:34 PM 05/16/2022    9:42 AM 03/22/2021   11:08 AM 03/18/2021    2:37 PM  Depression screen PHQ 2/9  Decreased Interest 0 0 0 0 0  Down, Depressed, Hopeless 0 0 0  0  PHQ - 2 Score  0 0 0 0 0  Altered sleeping 0 0 0    Tired, decreased energy 0 0 0    Change in appetite 0 0 0    Feeling bad or failure about yourself  0 0 0    Trouble concentrating 0 0 0    Moving slowly or fidgety/restless 0 0 0    Suicidal thoughts 0 0 0    PHQ-9 Score 0 0 0    Difficult doing work/chores Not difficult at all Not difficult at all Not difficult at all         07/30/2023    1:23 PM 06/19/2023    2:34 PM 05/16/2022    9:43 AM 01/04/2021    2:08 PM  GAD 7 : Generalized Anxiety Score  Nervous, Anxious, on Edge 2 0 0 0  Control/stop worrying 0 0 0 0  Worry too much - different  things 0 0 1 0  Trouble relaxing 0 0 2 0  Restless 0 0 0 0  Easily annoyed or irritable 0 0 0 1  Afraid - awful might happen 0 0 0 0  Total GAD 7 Score 2 0 3 1  Anxiety Difficulty Not difficult at all Not difficult at all Not difficult at all Not difficult at all   Relevant past medical, surgical, family and social history reviewed and updated as indicated. Interim medical history since our last visit reviewed. Allergies and medications reviewed and updated.  Review of Systems  Constitutional:  Negative for activity change, diaphoresis, fatigue and fever.  Respiratory:  Negative for cough, chest tightness, shortness of breath and wheezing.   Cardiovascular:  Negative for chest pain, palpitations and leg swelling.  Skin:  Positive for wound.  Neurological: Negative.   Psychiatric/Behavioral: Negative.      Per HPI unless specifically indicated above     Objective:    BP (!) 100/56 (BP Location: Left Arm)   Pulse 75   Temp 98.1 F (36.7 C) (Oral)   Resp 16   Wt 136 lb 8 oz (61.9 kg)   SpO2 99%   BMI 22.03 kg/m   Wt Readings from Last 3 Encounters:  07/30/23 136 lb 8 oz (61.9 kg)  06/19/23 138 lb 6.4 oz (62.8 kg)  06/04/23 147 lb 14.9 oz (67.1 kg)    Physical Exam Vitals and nursing note reviewed.  Constitutional:      General: He is awake. He is not in acute distress.    Appearance: Normal appearance. He is well-developed and well-groomed. He is not ill-appearing or toxic-appearing.  HENT:     Head: Normocephalic.     Right Ear: Hearing and external ear normal.     Left Ear: Hearing and external ear normal.  Eyes:     General: Lids are normal.     Extraocular Movements: Extraocular movements intact.     Conjunctiva/sclera: Conjunctivae normal.  Neck:     Thyroid: No thyromegaly.     Vascular: No carotid bruit.  Cardiovascular:     Rate and Rhythm: Normal rate and regular rhythm.     Heart sounds: Normal heart sounds. No murmur heard.    No gallop.  Pulmonary:      Effort: No accessory muscle usage or respiratory distress.     Breath sounds: Normal breath sounds.  Abdominal:     General: Bowel sounds are normal. There is no distension.     Palpations: Abdomen is soft.     Tenderness: There is no abdominal tenderness.  Musculoskeletal:     Cervical back: Full passive range of motion without pain.     Right lower leg: No edema.     Left lower leg: No edema.  Lymphadenopathy:     Cervical: No cervical adenopathy.  Skin:    General: Skin is warm.     Capillary Refill: Capillary refill takes less than 2 seconds.  Neurological:     Mental Status: He is alert and oriented to person, place, and time.     Cranial Nerves: Cranial nerves 2-12 are intact.     Gait: Gait is intact.     Deep Tendon Reflexes: Reflexes are normal and symmetric.     Reflex Scores:      Brachioradialis reflexes are 2+ on the right side and 2+ on the left side.      Patellar reflexes are 2+ on the right side and 2+ on the left side.    Comments: Oriented to person, place, time today.  Psychiatric:        Attention and Perception: Attention normal.        Mood and Affect: Mood normal.        Speech: Speech normal.        Behavior: Behavior normal. Behavior is cooperative.        Thought Content: Thought content normal.    Results for orders placed or performed during the hospital encounter of 06/30/23  Lipase, blood   Collection Time: 06/30/23  4:46 PM  Result Value Ref Range   Lipase 26 11 - 51 U/L  Comprehensive metabolic panel   Collection Time: 06/30/23  4:46 PM  Result Value Ref Range   Sodium 136 135 - 145 mmol/L   Potassium 3.4 (L) 3.5 - 5.1 mmol/L   Chloride 105 98 - 111 mmol/L   CO2 21 (L) 22 - 32 mmol/L   Glucose, Bld 113 (H) 70 - 99 mg/dL   BUN 8 8 - 23 mg/dL   Creatinine, Ser 7.62 0.61 - 1.24 mg/dL   Calcium 8.4 (L) 8.9 - 10.3 mg/dL   Total Protein 6.6 6.5 - 8.1 g/dL   Albumin 3.5 3.5 - 5.0 g/dL   AST 18 15 - 41 U/L   ALT 17 0 - 44 U/L    Alkaline Phosphatase 58 38 - 126 U/L   Total Bilirubin 0.5 <1.2 mg/dL   GFR, Estimated >83 >15 mL/min   Anion gap 10 5 - 15  CBC   Collection Time: 06/30/23  4:46 PM  Result Value Ref Range   WBC 11.1 (H) 4.0 - 10.5 K/uL   RBC 4.66 4.22 - 5.81 MIL/uL   Hemoglobin 14.6 13.0 - 17.0 g/dL   HCT 17.6 16.0 - 73.7 %   MCV 92.3 80.0 - 100.0 fL   MCH 31.3 26.0 - 34.0 pg   MCHC 34.0 30.0 - 36.0 g/dL   RDW 10.6 26.9 - 48.5 %   Platelets 388 150 - 400 K/uL   nRBC 0.0 0.0 - 0.2 %      Assessment & Plan:   Problem List Items Addressed This Visit       Cardiovascular and Mediastinum   Essential hypertension   Chronic, ongoing with BP well below goal. Will reduce Amlodipine to 2.5 MG and monitor closely.  Recommend she monitor BP at least a few mornings a week at home and document.  DASH diet at home.  Continue current medication regimen and adjust as needed.  Labs today: CMP.  Return in 6  months, recommend in 6 weeks but he refuses to come back sooner then 6 months.  Suspect lower BP related to weight loss since surgery.       Relevant Medications   amLODipine (NORVASC) 2.5 MG tablet   Hypotension   History of elevations, but currently remains well below goal.  Will further reduce Amlodipine to 2.5 MG daily and recommend good water intake at home.  Refer to HTN plan of care for further.      Relevant Medications   amLODipine (NORVASC) 2.5 MG tablet     Respiratory   Paraseptal emphysema (HCC) - Primary   Chronic, no current inhalers or symptoms.  Long time smoker.  Have recommended complete cessation.  Will plan on spirometry at upcoming visits, refuses today.  Continue yearly lung CT CA screening -- recommend he attend.  Initiate inhaler regimen as needed.       Relevant Orders   Ambulatory Referral Lung Cancer Screening  Pulmonary     Nervous and Auditory   Cerebral atrophy (HCC)   Noted on CT imaging in ER 01/18/23 with history of CVA x 2 in 2014 and history of heavier  alcohol use + opioid use.  Educated him on this and discussed we would monitor memory closely.  Ongoing memory changes since CVAs.  Is scheduled to see neurology in January.      Nicotine dependence, cigarettes, w unsp disorders   I have recommended complete cessation of tobacco use. I have discussed various options available for assistance with tobacco cessation including over the counter methods (Nicotine gum, patch and lozenges). We also discussed prescription options (Chantix, Nicotine Inhaler / Nasal Spray). The patient is not interested in pursuing any prescription tobacco cessation options at this time.  Recommend he continue yearly lung screening.         Other   Anxiety and depression   Chronic, stable.  Continue current medication regimen and adjust as needed.  Denies SI/HI.      Cognitive impairment   Ongoing for years.  History of CVA x 2 in 2014 and history of heavier alcohol use + opioid use.  Educated him on this and discussed we would monitor memory closely.  Ongoing memory changes since CVAs.  Is scheduled to see neurology in January.      History of CVA (cerebrovascular accident)   H/O x 3 CVA, recent on 01/18/23.  Continue ASA and to monitor lipid panel. Goal A1c <6.5%, BP <130/80, and LDL <70 for prevention.        Hyperlipidemia LDL goal <70   Chronic, ongoing.  Continue current medication regimen and adjust as needed.  Lipid panel.      Relevant Medications   amLODipine (NORVASC) 2.5 MG tablet   Other Relevant Orders   Comprehensive metabolic panel   Lipid Panel w/o Chol/HDL Ratio     Follow up plan: Return in about 6 months (around 01/28/2024) for COPD, HTN/HLD, Depression + needs nurse Medicare Wellness.

## 2023-07-30 NOTE — Assessment & Plan Note (Addendum)
Ongoing for years.  History of CVA x 2 in 2014 and history of heavier alcohol use + opioid use.  Educated him on this and discussed we would monitor memory closely.  Ongoing memory changes since CVAs.  Is scheduled to see neurology in January.

## 2023-07-31 LAB — LIPID PANEL W/O CHOL/HDL RATIO
Cholesterol, Total: 75 mg/dL — ABNORMAL LOW (ref 100–199)
HDL: 37 mg/dL — ABNORMAL LOW (ref >39)
LDL Chol Calc (NIH): 21 mg/dL (ref 0–99)
Triglycerides: 83 mg/dL (ref 0–149)
VLDL Cholesterol Cal: 17 mg/dL (ref 5–40)

## 2023-07-31 LAB — COMPREHENSIVE METABOLIC PANEL
ALT: 17 [IU]/L (ref 0–44)
AST: 15 [IU]/L (ref 0–40)
Albumin: 3.6 g/dL — ABNORMAL LOW (ref 3.9–4.9)
Alkaline Phosphatase: 67 [IU]/L (ref 44–121)
BUN/Creatinine Ratio: 10 (ref 10–24)
BUN: 11 mg/dL (ref 8–27)
Bilirubin Total: 0.4 mg/dL (ref 0.0–1.2)
CO2: 23 mmol/L (ref 20–29)
Calcium: 8.9 mg/dL (ref 8.6–10.2)
Chloride: 107 mmol/L — ABNORMAL HIGH (ref 96–106)
Creatinine, Ser: 1.06 mg/dL (ref 0.76–1.27)
Globulin, Total: 2 g/dL (ref 1.5–4.5)
Glucose: 118 mg/dL — ABNORMAL HIGH (ref 70–99)
Potassium: 4.6 mmol/L (ref 3.5–5.2)
Sodium: 144 mmol/L (ref 134–144)
Total Protein: 5.6 g/dL — ABNORMAL LOW (ref 6.0–8.5)
eGFR: 79 mL/min/{1.73_m2} (ref >59)

## 2023-07-31 NOTE — Progress Notes (Signed)
Good afternoon, please let Mark [name] know his labs have returned and overall remain at baseline.  No changes needed.  Continue all medications.  Any questions? Keep being stellar!!  Thank you for allowing me to participate in your care.  I appreciate you. Kindest regards, Teron Blais

## 2023-08-31 ENCOUNTER — Other Ambulatory Visit: Payer: Self-pay | Admitting: Nurse Practitioner

## 2023-08-31 NOTE — Telephone Encounter (Signed)
Unable to refill per protocol, Rx expired. Discontinued 06/08/23.  Requested Prescriptions  Pending Prescriptions Disp Refills   pravastatin (PRAVACHOL) 20 MG tablet [Pharmacy Med Name: PRAVASTATIN SODIUM 20 MG TAB] 90 tablet 4    Sig: TAKE 1 TABLET BY MOUTH EVERY DAY     Cardiovascular:  Antilipid - Statins Failed - 08/31/2023  9:24 AM      Failed - Lipid Panel in normal range within the last 12 months    Cholesterol, Total  Date Value Ref Range Status  07/30/2023 75 (L) 100 - 199 mg/dL Final   Cholesterol  Date Value Ref Range Status  09/13/2011 150 0 - 200 mg/dL Final   Ldl Cholesterol, Calc  Date Value Ref Range Status  09/13/2011 62 0 - 100 mg/dL Final   LDL Chol Calc (NIH)  Date Value Ref Range Status  07/30/2023 21 0 - 99 mg/dL Final   Direct LDL  Date Value Ref Range Status  06/08/2023 12 0 - 99 mg/dL Final    Comment:    Performed at [facility], [address] [place], [place] [address]   HDL Cholesterol  Date Value Ref Range Status  09/13/2011 44 40 - 60 mg/dL Final   HDL  Date Value Ref Range Status  07/30/2023 37 (L) >39 mg/dL Final   Triglycerides  Date Value Ref Range Status  07/30/2023 83 0 - 149 mg/dL Final  60/45/4098 119 (H) 0 - 200 mg/dL Final         Passed - Patient is not pregnant      Passed - Valid encounter within last 12 months    Recent Outpatient Visits           1 month ago Paraseptal emphysema (HCC)   [place] [facility] [place], Fieldon T, NP   2 months ago Sepsis due to undetermined organism (HCC)   [place] [facility] [place], Corrie Dandy T, NP   3 months ago Cognitive impairment   [place] [facility] Mecum, Erin E, PA-C   7 months ago Cerebral atrophy (HCC)   [place] [facility] [place], Corrie Dandy T, NP   1 year ago Encounter for [employer] annual wellness exam   [place] [facility] [place], Dorie Rank, NP       Future Appointments              In 5 months Cannady, Dorie Rank, NP [place] [facility], [facility]

## 2023-09-22 ENCOUNTER — Other Ambulatory Visit: Payer: Self-pay | Admitting: Nurse Practitioner

## 2023-09-24 NOTE — Telephone Encounter (Signed)
 Unable to refill per protocol, Rx expired. Discontinued 06/08/23.  Requested Prescriptions  Pending Prescriptions Disp Refills   pravastatin  (PRAVACHOL ) 20 MG tablet [Pharmacy Med Name: PRAVASTATIN  SODIUM 20 MG TAB] 90 tablet 4    Sig: TAKE 1 TABLET BY MOUTH EVERY DAY     Cardiovascular:  Antilipid - Statins Failed - 09/24/2023  1:33 PM      Failed - Lipid Panel in normal range within the last 12 months    Cholesterol, Total  Date Value Ref Range Status  07/30/2023 75 (L) 100 - 199 mg/dL Final   Cholesterol  Date Value Ref Range Status  09/13/2011 150 0 - 200 mg/dL Final   Ldl Cholesterol, Calc  Date Value Ref Range Status  09/13/2011 62 0 - 100 mg/dL Final   LDL Chol Calc (NIH)  Date Value Ref Range Status  07/30/2023 21 0 - 99 mg/dL Final   Direct LDL  Date Value Ref Range Status  06/08/2023 12 0 - 99 mg/dL Final    Comment:    Performed at [facility], [address] [place], [place] [address]   HDL Cholesterol  Date Value Ref Range Status  09/13/2011 44 40 - 60 mg/dL Final   HDL  Date Value Ref Range Status  07/30/2023 37 (L) >39 mg/dL Final   Triglycerides  Date Value Ref Range Status  07/30/2023 83 0 - 149 mg/dL Final  19/14/7829 562 (H) 0 - 200 mg/dL Final         Passed - Patient is not pregnant      Passed - Valid encounter within last 12 months    Recent Outpatient Visits           1 month ago Paraseptal emphysema (HCC)   [place] [facility] [place], [place] T, NP   3 months ago Sepsis due to undetermined organism (HCC)   [place] [facility], Sanjuan Crumbly T, NP   4 months ago Cognitive impairment   [place] [facility] Mecum, Erin E, PA-C   8 months ago Cerebral atrophy (HCC)   [place] [facility] [place], Sanjuan Crumbly T, NP   1 year ago Encounter for [employer] annual wellness exam    [facility] [place], Lavelle Posey, NP       Future Appointments              In 4 months Cannady, Jolene T, NP  [facility], [facility]

## 2023-11-04 ENCOUNTER — Other Ambulatory Visit: Payer: Self-pay | Admitting: Nurse Practitioner

## 2023-11-05 NOTE — Telephone Encounter (Signed)
 Requested Prescriptions  Refused Prescriptions Disp Refills   pravastatin (PRAVACHOL) 20 MG tablet [Pharmacy Med Name: PRAVASTATIN SODIUM 20 MG TAB] 90 tablet 4    Sig: TAKE 1 TABLET BY MOUTH EVERY DAY     Cardiovascular:  Antilipid - Statins Failed - 11/05/2023  5:55 PM      Failed - Lipid Panel in normal range within the last 12 months    Cholesterol, Total  Date Value Ref Range Status  07/30/2023 75 (L) 100 - 199 mg/dL Final   Cholesterol  Date Value Ref Range Status  09/13/2011 150 0 - 200 mg/dL Final   Ldl Cholesterol, Calc  Date Value Ref Range Status  09/13/2011 62 0 - 100 mg/dL Final   LDL Chol Calc (NIH)  Date Value Ref Range Status  07/30/2023 21 0 - 99 mg/dL Final   Direct LDL  Date Value Ref Range Status  06/08/2023 12 0 - 99 mg/dL Final    Comment:    Performed at [facility], [address], [place], [place] [address]   HDL Cholesterol  Date Value Ref Range Status  09/13/2011 44 40 - 60 mg/dL Final   HDL  Date Value Ref Range Status  07/30/2023 37 (L) >39 mg/dL Final   Triglycerides  Date Value Ref Range Status  07/30/2023 83 0 - 149 mg/dL Final  60/45/4098 119 (H) 0 - 200 mg/dL Final         Passed - Patient is not pregnant      Passed - Valid encounter within last 12 months    Recent Outpatient Visits           3 months ago Paraseptal emphysema (HCC)   [place] [facility] [place], [place] T, NP   4 months ago Sepsis due to undetermined organism (HCC)   [place] [facility] [place], Corrie Dandy T, NP   5 months ago Cognitive impairment   [place] [facility] Mecum, Erin E, PA-C   9 months ago Cerebral atrophy (HCC)   [place] [facility] [place], Corrie Dandy T, NP   1 year ago Encounter for [employer] annual wellness exam    [facility] [place], Dorie Rank, NP       Future Appointments             In 2 months Cannady, Dorie Rank, NP  [facility], [facility]

## 2023-12-05 ENCOUNTER — Other Ambulatory Visit: Payer: Self-pay | Admitting: Nurse Practitioner

## 2023-12-06 NOTE — Telephone Encounter (Signed)
 Unable to refill per protocol, Rx expired. Discontinued 06/08/23.  Requested Prescriptions  Pending Prescriptions Disp Refills   pravastatin  (PRAVACHOL ) 20 MG tablet [Pharmacy Med Name: PRAVASTATIN  SODIUM 20 MG TAB] 90 tablet 4    Sig: TAKE 1 TABLET BY MOUTH EVERY DAY     Cardiovascular:  Antilipid - Statins Failed - 12/06/2023  3:25 PM      Failed - Valid encounter within last 12 months    Recent Outpatient Visits   None     Future Appointments             In 1 month Cannady, Jolene T, NP [place] [facility]            Failed - Lipid Panel in normal range within the last 12 months    Cholesterol, Total  Date Value Ref Range Status  07/30/2023 75 (L) 100 - 199 mg/dL Final   Cholesterol  Date Value Ref Range Status  09/13/2011 150 0 - 200 mg/dL Final   Ldl Cholesterol, Calc  Date Value Ref Range Status  09/13/2011 62 0 - 100 mg/dL Final   LDL Chol Calc (NIH)  Date Value Ref Range Status  07/30/2023 21 0 - 99 mg/dL Final   Direct LDL  Date Value Ref Range Status  06/08/2023 12 0 - 99 mg/dL Final    Comment:    Performed at [facility], [address], [place], [place] [address]   HDL Cholesterol  Date Value Ref Range Status  09/13/2011 44 40 - 60 mg/dL Final   HDL  Date Value Ref Range Status  07/30/2023 37 (L) >39 mg/dL Final   Triglycerides  Date Value Ref Range Status  07/30/2023 83 0 - 149 mg/dL Final  41/32/4401 027 (H) 0 - 200 mg/dL Final         Passed - Patient is not pregnant

## 2024-01-01 ENCOUNTER — Other Ambulatory Visit: Payer: Self-pay | Admitting: Nurse Practitioner

## 2024-01-02 NOTE — Telephone Encounter (Signed)
 Discontinued Stop Taking at Discharge Mark Hove, MD 06/08/23 1851         Requested Prescriptions  Refused Prescriptions Disp Refills   pravastatin  (PRAVACHOL ) 20 MG tablet [Pharmacy Med Name: PRAVASTATIN  SODIUM 20 MG TAB] 90 tablet 4    Sig: TAKE 1 TABLET BY MOUTH EVERY DAY     There is no refill protocol information for this order

## 2024-01-26 NOTE — Patient Instructions (Incomplete)

## 2024-01-28 ENCOUNTER — Ambulatory Visit: Payer: Self-pay | Admitting: Nurse Practitioner

## 2024-01-28 DIAGNOSIS — N400 Enlarged prostate without lower urinary tract symptoms: Secondary | ICD-10-CM

## 2024-01-28 DIAGNOSIS — R7309 Other abnormal glucose: Secondary | ICD-10-CM

## 2024-01-28 DIAGNOSIS — F419 Anxiety disorder, unspecified: Secondary | ICD-10-CM

## 2024-01-28 DIAGNOSIS — F1111 Opioid abuse, in remission: Secondary | ICD-10-CM

## 2024-01-28 DIAGNOSIS — I10 Essential (primary) hypertension: Secondary | ICD-10-CM

## 2024-01-28 DIAGNOSIS — K219 Gastro-esophageal reflux disease without esophagitis: Secondary | ICD-10-CM

## 2024-01-28 DIAGNOSIS — F17219 Nicotine dependence, cigarettes, with unspecified nicotine-induced disorders: Secondary | ICD-10-CM

## 2024-01-28 DIAGNOSIS — E538 Deficiency of other specified B group vitamins: Secondary | ICD-10-CM

## 2024-01-28 DIAGNOSIS — J438 Other emphysema: Secondary | ICD-10-CM

## 2024-01-28 DIAGNOSIS — I700 Atherosclerosis of aorta: Secondary | ICD-10-CM

## 2024-01-28 DIAGNOSIS — E785 Hyperlipidemia, unspecified: Secondary | ICD-10-CM

## 2024-01-28 DIAGNOSIS — G319 Degenerative disease of nervous system, unspecified: Secondary | ICD-10-CM

## 2024-03-04 ENCOUNTER — Other Ambulatory Visit: Payer: Self-pay | Admitting: Nurse Practitioner

## 2024-03-06 NOTE — Telephone Encounter (Signed)
 Requested medication (s) are due for refill today: -  Requested medication (s) are on the active medication list: hx med  Last refill:  01/26/24  Future visit scheduled: no  Notes to clinic:  hx provider   Requested Prescriptions  Pending Prescriptions Disp Refills   omeprazole  (PRILOSEC) 20 MG capsule [Pharmacy Med Name: OMEPRAZOLE  DR 20 MG CAPSULE] 90 capsule 3    Sig: TAKE 1 CAPSULE BY MOUTH EVERY DAY     Gastroenterology: Proton Pump Inhibitors Failed - 03/06/2024 12:44 PM      Failed - Valid encounter within last 12 months    Recent Outpatient Visits   None

## 2024-06-25 ENCOUNTER — Other Ambulatory Visit: Payer: Self-pay | Admitting: Nurse Practitioner

## 2024-06-25 DIAGNOSIS — E785 Hyperlipidemia, unspecified: Secondary | ICD-10-CM

## 2024-06-26 NOTE — Telephone Encounter (Signed)
 Requested medication (s) are due for refill today: Yes  Requested medication (s) are on the active medication list: Yes  Last refill:  06/19/23  Future visit scheduled: No  Notes to clinic:  Phone out of order, unable to leave a message to make appointment.    Requested Prescriptions  Pending Prescriptions Disp Refills   metoprolol  tartrate (LOPRESSOR ) 25 MG tablet [Pharmacy Med Name: METOPROLOL  TARTRATE 25 MG TAB] 180 tablet 4    Sig: TAKE 1 TABLET BY MOUTH TWICE A DAY     Cardiovascular:  Beta Blockers Failed - 06/26/2024  3:54 PM      Failed - Valid encounter within last 6 months    Recent Outpatient Visits   None            Passed - Last BP in normal range    BP Readings from Last 1 Encounters:  07/30/23 (!) 100/56         Passed - Last Heart Rate in normal range    Pulse Readings from Last 1 Encounters:  07/30/23 75

## 2024-06-30 NOTE — Telephone Encounter (Signed)
 Can we please reach out to schedule him, Thank you!

## 2024-06-30 NOTE — Telephone Encounter (Signed)
 Scheduled

## 2024-08-02 NOTE — Patient Instructions (Signed)
 Be Involved in Caring For Your Health:  Taking Medications When medications are taken as directed, they can greatly improve your health. But if they are not taken as prescribed, they may not work. In some cases, not taking them correctly can be harmful. To help ensure your treatment remains effective and safe, understand your medications and how to take them. Bring your medications to each visit for review by your provider.  Your lab results, notes, and after visit summary will be available on My Chart. We strongly encourage you to use this feature. If lab results are abnormal the clinic will contact you with the appropriate steps. If the clinic does not contact you assume the results are satisfactory. You can always view your results on My Chart. If you have questions regarding your health or results, please contact the clinic during office hours. You can also ask questions on My Chart.  We at [facility] are grateful that you chose [place] to provide your care. We strive to provide evidence-based and compassionate care and are always looking for feedback. If you get a survey from the clinic please complete this so we can hear your opinions.  Heart-Healthy Eating Plan Many factors influence your heart health, including eating and exercise habits. Heart health is also called coronary health. Coronary risk increases with abnormal blood fat (lipid) levels. A heart-healthy eating plan includes limiting unhealthy fats, increasing healthy fats, limiting salt (sodium) intake, and making other diet and lifestyle changes. What is my plan? Your health care provider may recommend that: You limit your fat intake to _________% or less of your total calories each day. You limit your saturated fat intake to _________% or less of your total calories each day. You limit the amount of cholesterol in your diet to less than _________ mg per day. You limit the amount of sodium in your diet to less than _________  mg per day. What are tips for following this plan? Cooking Cook foods using methods other than frying. Baking, boiling, grilling, and broiling are all good options. Other ways to reduce fat include: Removing the skin from poultry. Removing all visible fats from meats. Steaming vegetables in water or broth. Meal planning  At meals, imagine dividing your plate into fourths: Fill one-half of your plate with vegetables and green salads. Fill one-fourth of your plate with whole grains. Fill one-fourth of your plate with lean protein foods. Eat 2-4 cups of vegetables per day. One cup of vegetables equals 1 cup (91 g) broccoli or cauliflower florets, 2 medium carrots, 1 large bell pepper, 1 large sweet potato, 1 large tomato, 1 medium white potato, 2 cups (150 g) raw leafy greens. Eat 1-2 cups of fruit per day. One cup of fruit equals 1 small apple, 1 large banana, 1 cup (237 g) mixed fruit, 1 large orange,  cup (82 g) dried fruit, 1 cup (240 mL) 100% fruit juice. Eat more foods that contain soluble fiber. Examples include apples, broccoli, carrots, beans, peas, and barley. Aim to get 25-30 g of fiber per day. Increase your consumption of legumes, nuts, and seeds to 4-5 servings per week. One serving of dried beans or legumes equals  cup (90 g) cooked, 1 serving of nuts is  oz (12 almonds, 24 pistachios, or 7 walnut halves), and 1 serving of seeds equals  oz (8 g). Fats Choose healthy fats more often. Choose monounsaturated and polyunsaturated fats, such as olive and canola oils, avocado oil, flaxseeds, walnuts, almonds, and seeds. Eat  more omega-3 fats. Choose salmon, mackerel, sardines, tuna, flaxseed oil, and ground flaxseeds. Aim to eat fish at least 2 times each week. Check food labels carefully to identify foods with trans fats or high amounts of saturated fat. Limit saturated fats. These are found in animal products, such as meats, butter, and cream. Plant sources of saturated fats  include palm oil, palm kernel oil, and coconut oil. Avoid foods with partially hydrogenated oils in them. These contain trans fats. Examples are stick margarine, some tub margarines, cookies, crackers, and other baked goods. Avoid fried foods. General information Eat more home-cooked food and less restaurant, buffet, and fast food. Limit or avoid alcohol. Limit foods that are high in added sugar and simple starches such as foods made using white refined flour (white breads, pastries, sweets). Lose weight if you are overweight. Losing just 5-10% of your body weight can help your overall health and prevent diseases such as diabetes and heart disease. Monitor your sodium intake, especially if you have high blood pressure. Talk with your health care provider about your sodium intake. Try to incorporate more vegetarian meals weekly. What foods should I eat? Fruits All fresh, canned (in natural juice), or frozen fruits. Vegetables Fresh or frozen vegetables (raw, steamed, roasted, or grilled). Green salads. Grains Most grains. Choose whole wheat and whole grains most of the time. Rice and pasta, including brown rice and pastas made with whole wheat. Meats and other proteins Lean, well-trimmed beef, veal, pork, and lamb. Chicken and [place] without skin. All fish and shellfish. Wild duck, rabbit, pheasant, and venison. Egg whites or low-cholesterol egg substitutes. Dried beans, peas, lentils, and tofu. Seeds and most nuts. Dairy Low-fat or nonfat cheeses, including ricotta and mozzarella. Skim or 1% milk (liquid, powdered, or evaporated). Buttermilk made with low-fat milk. Nonfat or low-fat yogurt. Fats and oils Non-hydrogenated (trans-free) margarines. Vegetable oils, including soybean, sesame, sunflower, olive, avocado, peanut, safflower, corn, canola, and cottonseed. Salad dressings or mayonnaise made with a vegetable oil. Beverages Water (mineral or sparkling). Coffee and tea. Unsweetened ice  tea. Diet beverages. Sweets and desserts Sherbet, gelatin, and fruit ice. Small amounts of dark chocolate. Limit all sweets and desserts. Seasonings and condiments All seasonings and condiments. The items listed above may not be a complete list of foods and beverages you can eat. Contact a dietitian for more options. What foods should I avoid? Fruits Canned fruit in heavy syrup. Fruit in cream or butter sauce. Fried fruit. Limit coconut. Vegetables Vegetables cooked in cheese, cream, or butter sauce. Fried vegetables. Grains Breads made with saturated or trans fats, oils, or whole milk. Croissants. Sweet rolls. Donuts. High-fat crackers, such as cheese crackers and chips. Meats and other proteins Fatty meats, such as hot dogs, ribs, sausage, bacon, rib-eye roast or steak. High-fat deli meats, such as salami and bologna. Caviar. Domestic duck and goose. Organ meats, such as liver. Dairy Cream, sour cream, cream cheese, and creamed cottage cheese. Whole-milk cheeses. Whole or 2% milk (liquid, evaporated, or condensed). Whole buttermilk. Cream sauce or high-fat cheese sauce. Whole-milk yogurt. Fats and oils Meat fat, or shortening. Cocoa butter, hydrogenated oils, palm oil, coconut oil, palm kernel oil. Solid fats and shortenings, including bacon fat, salt pork, lard, and butter. Nondairy cream substitutes. Salad dressings with cheese or sour cream. Beverages Regular sodas and any drinks with added sugar. Sweets and desserts Frosting. Pudding. Cookies. Cakes. Pies. Milk chocolate or white chocolate. Buttered syrups. Full-fat ice cream or ice cream drinks. The items listed above may  not be a complete list of foods and beverages to avoid. Contact a dietitian for more information. Summary Heart-healthy meal planning includes limiting unhealthy fats, increasing healthy fats, limiting salt (sodium) intake and making other diet and lifestyle changes. Lose weight if you are overweight. Losing just  5-10% of your body weight can help your overall health and prevent diseases such as diabetes and heart disease. Focus on eating a balance of foods, including fruits and vegetables, low-fat or nonfat dairy, lean protein, nuts and legumes, whole grains, and heart-healthy oils and fats. This information is not intended to replace advice given to you by your health care provider. Make sure you discuss any questions you have with your health care provider. Document Revised: 09/05/2021 Document Reviewed: 09/05/2021 Elsevier Patient Education  2024 [employer].

## 2024-08-05 ENCOUNTER — Encounter: Payer: Self-pay | Admitting: Nurse Practitioner

## 2024-08-05 ENCOUNTER — Other Ambulatory Visit: Payer: Self-pay

## 2024-08-05 ENCOUNTER — Ambulatory Visit (INDEPENDENT_AMBULATORY_CARE_PROVIDER_SITE_OTHER): Admitting: Nurse Practitioner

## 2024-08-05 VITALS — BP 110/71 | HR 78 | Temp 98.3°F | Resp 16 | Ht 65.98 in | Wt 131.2 lb

## 2024-08-05 DIAGNOSIS — F419 Anxiety disorder, unspecified: Secondary | ICD-10-CM | POA: Diagnosis not present

## 2024-08-05 DIAGNOSIS — E785 Hyperlipidemia, unspecified: Secondary | ICD-10-CM | POA: Diagnosis not present

## 2024-08-05 DIAGNOSIS — K219 Gastro-esophageal reflux disease without esophagitis: Secondary | ICD-10-CM | POA: Diagnosis not present

## 2024-08-05 DIAGNOSIS — N400 Enlarged prostate without lower urinary tract symptoms: Secondary | ICD-10-CM | POA: Diagnosis not present

## 2024-08-05 DIAGNOSIS — J438 Other emphysema: Secondary | ICD-10-CM | POA: Diagnosis not present

## 2024-08-05 DIAGNOSIS — R7309 Other abnormal glucose: Secondary | ICD-10-CM

## 2024-08-05 DIAGNOSIS — F17219 Nicotine dependence, cigarettes, with unspecified nicotine-induced disorders: Secondary | ICD-10-CM | POA: Diagnosis not present

## 2024-08-05 DIAGNOSIS — Z87891 Personal history of nicotine dependence: Secondary | ICD-10-CM

## 2024-08-05 DIAGNOSIS — F17210 Nicotine dependence, cigarettes, uncomplicated: Secondary | ICD-10-CM

## 2024-08-05 DIAGNOSIS — E538 Deficiency of other specified B group vitamins: Secondary | ICD-10-CM | POA: Diagnosis not present

## 2024-08-05 DIAGNOSIS — G319 Degenerative disease of nervous system, unspecified: Secondary | ICD-10-CM | POA: Diagnosis not present

## 2024-08-05 DIAGNOSIS — F1111 Opioid abuse, in remission: Secondary | ICD-10-CM | POA: Diagnosis not present

## 2024-08-05 DIAGNOSIS — F32A Depression, unspecified: Secondary | ICD-10-CM | POA: Diagnosis not present

## 2024-08-05 DIAGNOSIS — Z122 Encounter for screening for malignant neoplasm of respiratory organs: Secondary | ICD-10-CM

## 2024-08-05 DIAGNOSIS — I10 Essential (primary) hypertension: Secondary | ICD-10-CM | POA: Diagnosis not present

## 2024-08-05 LAB — BAYER DCA HB A1C WAIVED: HB A1C (BAYER DCA - WAIVED): 5.6 % (ref 4.8–5.6)

## 2024-08-05 MED ORDER — CYANOCOBALAMIN 1000 MCG PO TABS: 1000.0000 ug | ORAL_TABLET | Freq: Every day | ORAL | 4 refills | Status: AC

## 2024-08-05 MED ORDER — GABAPENTIN 300 MG PO CAPS: 300.0000 mg | ORAL_CAPSULE | Freq: Three times a day (TID) | ORAL | 4 refills | Status: AC

## 2024-08-05 MED ORDER — PANTOPRAZOLE SODIUM 40 MG PO TBEC: 40.0000 mg | DELAYED_RELEASE_TABLET | Freq: Every day | ORAL | 1 refills | Status: AC

## 2024-08-05 MED ORDER — TAMSULOSIN HCL 0.4 MG PO CAPS: 0.4000 mg | ORAL_CAPSULE | Freq: Every day | ORAL | 1 refills | Status: AC

## 2024-08-05 MED ORDER — AMLODIPINE BESYLATE 2.5 MG PO TABS: 2.5000 mg | ORAL_TABLET | Freq: Every day | ORAL | 1 refills | Status: AC

## 2024-08-05 MED ORDER — METOPROLOL TARTRATE 25 MG PO TABS: 25.0000 mg | ORAL_TABLET | Freq: Two times a day (BID) | ORAL | 1 refills | Status: AC

## 2024-08-05 MED ORDER — ATORVASTATIN CALCIUM 80 MG PO TABS: 80.0000 mg | ORAL_TABLET | Freq: Every day | ORAL | 1 refills | Status: AC

## 2024-08-05 MED ORDER — CITALOPRAM HYDROBROMIDE 20 MG PO TABS: 20.0000 mg | ORAL_TABLET | Freq: Every day | ORAL | 1 refills | Status: AC

## 2024-08-05 NOTE — Assessment & Plan Note (Signed)
 Chronic, no current inhalers or symptoms.  Long time smoker.  Have recommended complete cessation.  Will plan on spirometry at upcoming visits, refuses today.  Continue yearly lung CT CA screening -- recommend he attend and new referral placed.  Initiate inhaler regimen as needed.

## 2024-08-05 NOTE — Assessment & Plan Note (Signed)
I have recommended complete cessation of tobacco use. I have discussed various options available for assistance with tobacco cessation including over the counter methods (Nicotine gum, patch and lozenges). We also discussed prescription options (Chantix, Nicotine Inhaler / Nasal Spray). The patient is not interested in pursuing any prescription tobacco cessation options at this time.  Recommend he continue yearly lung screening.

## 2024-08-05 NOTE — Progress Notes (Signed)
 "  BP 110/71 (BP Location: Left Arm, Patient Position: Sitting, Cuff Size: Normal)   Pulse 78   Temp 98.3 F (36.8 C) (Oral)   Resp 16   Ht 5' 5.98 (1.676 m)   Wt 131 lb 3.2 oz (59.5 kg)   SpO2 95%   BMI 21.19 kg/m    Subjective:    Patient ID: Mark [name], male    DOB: [birth year], 63 y.o.   MRN: [id number]  HPI: Mark [name] is a 63 y.o. male  Chief Complaint  Patient presents with   Hypertension   Hyperlipidemia   COPD   Lost to follow-up, has not been seen in office since 07/30/23.  Needs refills on all medications, including Flomax  which he takes for BPH.  HYPERTENSION / HYPERLIPIDEMIA Taking Amlodipine  and Metoprolol  daily. History of CVA x 2 in 2014. Memory is staying the same. Brother is now living with him. Low B12 levels in past, not consistent at taking supplement.  He continues to take Protonix  for reflux issues. Satisfied with current treatment? yes Duration of hypertension: chronic BP monitoring frequency: no BP range:  BP medication side effects: no Duration of hyperlipidemia: chronic Cholesterol medication side effects: no Cholesterol supplements: none Medication compliance: good compliance Aspirin : yes Recent stressors: no Recurrent headaches: no Visual changes: no Palpitations: no Dyspnea: no Chest pain: no Lower extremity edema: no Dizzy/lightheaded: no  The ASCVD Risk score (Arnett DK, et al., 2019) failed to calculate for the following reasons:   Risk score cannot be calculated because patient has a medical history suggesting prior/existing ASCVD   * - Cholesterol units were assumed    08/05/2024   11:06 AM 06/19/2023    2:19 PM 07/25/2022   11:07 AM 03/18/2021    2:39 PM 05/30/2017    3:18 PM  6CIT Screen  What Year? 0 points 4 points 0 points 0 points 0 points  What month? 0 points 0 points 0 points 0 points 0 points  What time? 0 points 0 points 0 points 0 points 0 points  Count back from 20 4 points 4 points 2 points 2  points 0 points  Months in reverse 4 points 4 points 2 points 4 points 0 points  Repeat phrase 6 points 4 points 6 points 6 points 2 points  Total Score 14 points 16 points 10 points 12 points 2 points   Impaired Fasting Glucose HbA1C:  Lab Results  Component Value Date   HGBA1C 5.6 06/07/2023  Duration of elevated blood sugar: chronic Polydipsia: no Polyuria: no Weight change: no Visual disturbance: no Glucose Monitoring: no    Accucheck frequency: Not Checking    Fasting glucose:     Post prandial:  Diabetic Education: Not Completed Family history of diabetes: no   COPD Last lung cancer screening was in September 2021 and showed emphysema and aortic atherosclerosis + a 2.8 cm left lower lobe nodule, was to continues yearly screening but has not returned. Smokes about 1 PPD, has been smoking since age 80.  No current inhalers or symptoms.   COPD status: stable Satisfied with current treatment?: yes Oxygen  use: no Dyspnea frequency: none Cough frequency: none Rescue inhaler frequency:  none Limitation of activity: no Productive cough: none Last Spirometry: none, refuses Pneumovax: Not up to Date, refuses Influenza: refuses  DEPRESSION Taking Celexa  and Gabapentin . Mood status: stable Satisfied with current treatment?: yes Symptom severity: mild  Duration of current treatment : chronic Side effects: no Medication compliance: good compliance Psychotherapy/counseling: none  Depressed mood: no Anxious mood: no Anhedonia: no Significant weight loss or gain: no Insomnia: none Fatigue: no Feelings of worthlessness or guilt: no Impaired concentration/indecisiveness: no Suicidal ideations: no Hopelessness: no Crying spells: no    08/05/2024   11:08 AM 07/30/2023    1:23 PM 06/19/2023    2:34 PM 05/16/2022    9:42 AM 03/22/2021   11:08 AM  Depression screen PHQ 2/9  Decreased Interest 0 0 0 0 0  Down, Depressed, Hopeless 0 0 0 0   PHQ - 2 Score 0 0 0 0 0  Altered  sleeping 0 0 0 0   Tired, decreased energy 0 0 0 0   Change in appetite 0 0 0 0   Feeling bad or failure about yourself  0 0 0 0   Trouble concentrating 0 0 0 0   Moving slowly or fidgety/restless 0 0 0 0   Suicidal thoughts 0 0 0 0   PHQ-9 Score 0 0  0  0    Difficult doing work/chores Not difficult at all Not difficult at all Not difficult at all Not difficult at all      Data saved with a previous flowsheet row definition       08/05/2024   11:08 AM 07/30/2023    1:23 PM 06/19/2023    2:34 PM 05/16/2022    9:43 AM  GAD 7 : Generalized Anxiety Score  Nervous, Anxious, on Edge 0 2 0 0  Control/stop worrying 0 0 0 0  Worry too much - different things 0 0 0 1  Trouble relaxing 0 0 0 2  Restless 0 0 0 0  Easily annoyed or irritable 0 0 0 0  Afraid - awful might happen 0 0 0 0  Total GAD 7 Score 0 2 0 3  Anxiety Difficulty Not difficult at all Not difficult at all Not difficult at all Not difficult at all   Relevant past medical, surgical, family and social history reviewed and updated as indicated. Interim medical history since our last visit reviewed. Allergies and medications reviewed and updated.  Review of Systems  Constitutional:  Negative for activity change, diaphoresis, fatigue and fever.  Respiratory:  Negative for cough, chest tightness, shortness of breath and wheezing.   Cardiovascular:  Negative for chest pain, palpitations and leg swelling.  Gastrointestinal: Negative.   Endocrine: Negative for cold intolerance, heat intolerance, polydipsia, polyphagia and polyuria.  Skin: Negative.   Neurological: Negative.   Psychiatric/Behavioral: Negative.      Per HPI unless specifically indicated above     Objective:    BP 110/71 (BP Location: Left Arm, Patient Position: Sitting, Cuff Size: Normal)   Pulse 78   Temp 98.3 F (36.8 C) (Oral)   Resp 16   Ht 5' 5.98 (1.676 m)   Wt 131 lb 3.2 oz (59.5 kg)   SpO2 95%   BMI 21.19 kg/m   Wt Readings from Last 3  Encounters:  08/05/24 131 lb 3.2 oz (59.5 kg)  07/30/23 136 lb 8 oz (61.9 kg)  06/19/23 138 lb 6.4 oz (62.8 kg)    Physical Exam Vitals and nursing note reviewed.  Constitutional:      General: He is awake. He is not in acute distress.    Appearance: Normal appearance. He is well-developed and well-groomed. He is not ill-appearing or toxic-appearing.  HENT:     Head: Normocephalic.     Right Ear: Hearing and external ear normal.     Left  Ear: Hearing and external ear normal.  Eyes:     General: Lids are normal.     Extraocular Movements: Extraocular movements intact.     Conjunctiva/sclera: Conjunctivae normal.  Neck:     Thyroid : No thyromegaly.     Vascular: No carotid bruit.  Cardiovascular:     Rate and Rhythm: Normal rate and regular rhythm.     Heart sounds: Normal heart sounds. No murmur heard.    No gallop.  Pulmonary:     Effort: No accessory muscle usage or respiratory distress.     Breath sounds: Normal breath sounds.  Abdominal:     General: Bowel sounds are normal. There is no distension.     Palpations: Abdomen is soft.     Tenderness: There is no abdominal tenderness.  Musculoskeletal:     Cervical back: Full passive range of motion without pain.     Right lower leg: No edema.     Left lower leg: No edema.  Lymphadenopathy:     Cervical: No cervical adenopathy.  Skin:    General: Skin is warm.     Capillary Refill: Capillary refill takes less than 2 seconds.  Neurological:     Mental Status: He is alert and oriented to person, place, and time.     Cranial Nerves: Cranial nerves 2-12 are intact.     Gait: Gait is intact.     Deep Tendon Reflexes: Reflexes are normal and symmetric.     Reflex Scores:      Brachioradialis reflexes are 2+ on the right side and 2+ on the left side.      Patellar reflexes are 2+ on the right side and 2+ on the left side.    Comments: Oriented to person, place, time today.  Psychiatric:        Attention and Perception:  Attention normal.        Mood and Affect: Mood normal.        Speech: Speech normal.        Behavior: Behavior normal. Behavior is cooperative.        Thought Content: Thought content normal.    Results for orders placed or performed in visit on 07/30/23  Comprehensive metabolic panel   Collection Time: 07/30/23  1:27 PM  Result Value Ref Range   Glucose 118 (H) 70 - 99 mg/dL   BUN 11 8 - 27 mg/dL   Creatinine, Ser 8.93 0.76 - 1.27 mg/dL   eGFR 79 >40 fO/fpw/8.26   BUN/Creatinine Ratio 10 10 - 24   Sodium 144 134 - 144 mmol/L   Potassium 4.6 3.5 - 5.2 mmol/L   Chloride 107 (H) 96 - 106 mmol/L   CO2 23 20 - 29 mmol/L   Calcium  8.9 8.6 - 10.2 mg/dL   Total Protein 5.6 (L) 6.0 - 8.5 g/dL   Albumin 3.6 (L) 3.9 - 4.9 g/dL   Globulin, Total 2.0 1.5 - 4.5 g/dL   Bilirubin Total 0.4 0.0 - 1.2 mg/dL   Alkaline Phosphatase 67 44 - 121 IU/L   AST 15 0 - 40 IU/L   ALT 17 0 - 44 IU/L  Lipid Panel w/o Chol/HDL Ratio   Collection Time: 07/30/23  1:27 PM  Result Value Ref Range   Cholesterol, Total 75 (L) 100 - 199 mg/dL   Triglycerides 83 0 - 149 mg/dL   HDL 37 (L) >60 mg/dL   VLDL Cholesterol Cal 17 5 - 40 mg/dL   LDL Chol Calc (NIH) 21  0 - 99 mg/dL      Assessment & Plan:   Problem List Items Addressed This Visit       Cardiovascular and Mediastinum   Essential hypertension   Chronic, stable. Continue Amlodipine  2.5 MG daily and monitor closely.  Recommend he monitor BP at least a few mornings a week at home and document.  DASH diet at home.  Continue current medication regimen and adjust as needed.  Labs today: CBC, TSH, CMP.  Return in 6 months. Refills sent in.       Relevant Medications   amLODipine  (NORVASC ) 2.5 MG tablet   atorvastatin  (LIPITOR) 80 MG tablet   metoprolol  tartrate (LOPRESSOR ) 25 MG tablet   Other Relevant Orders   CBC with Differential/Platelet   TSH     Respiratory   Paraseptal emphysema (HCC) - Primary   Chronic, no current inhalers or symptoms.   Long time smoker.  Have recommended complete cessation.  Will plan on spirometry at upcoming visits, refuses today.  Continue yearly lung CT CA screening -- recommend he attend and new referral placed.  Initiate inhaler regimen as needed.       Relevant Orders   Ambulatory Referral Lung Cancer Screening [facility]     Digestive   GERD without esophagitis   Chronic, stable. Continue Protonix  for now and check Mag level today. Risks of PPI use were discussed with patient including bone loss, C. Diff diarrhea, pneumonia, infections, CKD, electrolyte abnormalities.  Pt. Verbalizes understanding and chooses to continue the medication.        Relevant Medications   pantoprazole  (PROTONIX ) 40 MG tablet   Other Relevant Orders   Magnesium      Nervous and Auditory   Nicotine  dependence, cigarettes, w unsp disorders   I have recommended complete cessation of tobacco use. I have discussed various options available for assistance with tobacco cessation including over the counter methods (Nicotine  gum, patch and lozenges). We also discussed prescription options (Chantix, Nicotine  Inhaler / Nasal Spray). The patient is not interested in pursuing any prescription tobacco cessation options at this time.  Recommend he continue yearly lung screening.       Cerebral atrophy   Noted on CT imaging in ER 01/18/23 with history of CVA x 2 in 2014 and history of heavier alcohol use + opioid use.  Educated him on this and discussed we would monitor memory closely.  Ongoing memory changes since CVAs.  Never attended scheduled neurology visit in January 2025.        Genitourinary   BPH (benign prostatic hyperplasia)   Check PSA today, currently stable. Continue Flomax . Refills sent in.      Relevant Medications   tamsulosin  (FLOMAX ) 0.4 MG CAPS capsule   Other Relevant Orders   PSA     Other   Hyperlipidemia LDL goal <70   Chronic, ongoing.  Continue current medication regimen and adjust as needed.   Lipid panel.      Relevant Medications   amLODipine  (NORVASC ) 2.5 MG tablet   atorvastatin  (LIPITOR) 80 MG tablet   metoprolol  tartrate (LOPRESSOR ) 25 MG tablet   Other Relevant Orders   Comprehensive metabolic panel with GFR   Lipid Panel w/o Chol/HDL Ratio   History of opioid abuse (HCC)   Denies any recent use, monitor closely.  Continue current Gabapentin  for pain/mood and avoid opioids.      Elevated hemoglobin A1c   A1c remains stable at 5.6% recent labs, rechecktoday.  Continue diet focus.  Relevant Orders   Bayer DCA Hb A1c Waived   B12 deficiency   Noted on past labs.  To continue daily supplement and will recheck today.  Refills sent.      Relevant Orders   Vitamin B12   Anxiety and depression   Chronic, stable.  Continue current medication regimen and adjust as needed.  Denies SI/HI. Refills sent in.      Relevant Medications   citalopram  (CELEXA ) 20 MG tablet     Follow up plan: Return in about 6 months (around 02/03/2025) for HTN/HLD, COPD, IFG, Depression.      "

## 2024-08-05 NOTE — Assessment & Plan Note (Signed)
 Chronic, stable. Continue Amlodipine  2.5 MG daily and monitor closely.  Recommend he monitor BP at least a few mornings a week at home and document.  DASH diet at home.  Continue current medication regimen and adjust as needed.  Labs today: CBC, TSH, CMP.  Return in 6 months. Refills sent in.

## 2024-08-05 NOTE — Assessment & Plan Note (Signed)
 Check PSA today, currently stable. Continue Flomax . Refills sent in.

## 2024-08-05 NOTE — Assessment & Plan Note (Addendum)
 Denies any recent use, monitor closely.  Continue current Gabapentin  for pain/mood and avoid opioids.

## 2024-08-05 NOTE — Assessment & Plan Note (Signed)
 Noted on past labs.  To continue daily supplement and will recheck today.  Refills sent.

## 2024-08-05 NOTE — Assessment & Plan Note (Signed)
 Chronic, ongoing.  Continue current medication regimen and adjust as needed.  Lipid panel.

## 2024-08-05 NOTE — Assessment & Plan Note (Signed)
 A1c remains stable at 5.6% recent labs, rechecktoday.  Continue diet focus.

## 2024-08-05 NOTE — Assessment & Plan Note (Signed)
 Chronic, stable. Continue Protonix  for now and check Mag level today. Risks of PPI use were discussed with patient including bone loss, C. Diff diarrhea, pneumonia, infections, CKD, electrolyte abnormalities.  Pt. Verbalizes understanding and chooses to continue the medication.

## 2024-08-05 NOTE — Assessment & Plan Note (Signed)
 Chronic, stable.  Continue current medication regimen and adjust as needed.  Denies SI/HI. Refills sent in.

## 2024-08-05 NOTE — Assessment & Plan Note (Signed)
 Noted on CT imaging in ER 01/18/23 with history of CVA x 2 in 2014 and history of heavier alcohol use + opioid use.  Educated him on this and discussed we would monitor memory closely.  Ongoing memory changes since CVAs.  Never attended scheduled neurology visit in January 2025.

## 2024-08-06 ENCOUNTER — Ambulatory Visit: Payer: Self-pay | Admitting: Nurse Practitioner

## 2024-08-06 DIAGNOSIS — R7989 Other specified abnormal findings of blood chemistry: Secondary | ICD-10-CM

## 2024-08-06 LAB — CBC WITH DIFFERENTIAL/PLATELET
Basophils Absolute: 0.1 x10E3/uL (ref 0.0–0.2)
Basos: 1 %
EOS (ABSOLUTE): 0.1 x10E3/uL (ref 0.0–0.4)
Eos: 1 %
Hematocrit: 48 % (ref 37.5–51.0)
Hemoglobin: 15.5 g/dL (ref 13.0–17.7)
Immature Grans (Abs): 0 x10E3/uL (ref 0.0–0.1)
Immature Granulocytes: 0 %
Lymphocytes Absolute: 1.5 x10E3/uL (ref 0.7–3.1)
Lymphs: 18 %
MCH: 31.8 pg (ref 26.6–33.0)
MCHC: 32.3 g/dL (ref 31.5–35.7)
MCV: 99 fL — ABNORMAL HIGH (ref 79–97)
Monocytes Absolute: 0.6 x10E3/uL (ref 0.1–0.9)
Monocytes: 7 %
Neutrophils Absolute: 6 x10E3/uL (ref 1.4–7.0)
Neutrophils: 73 %
Platelets: 321 x10E3/uL (ref 150–450)
RBC: 4.87 x10E6/uL (ref 4.14–5.80)
RDW: 12.3 % (ref 11.6–15.4)
WBC: 8.3 x10E3/uL (ref 3.4–10.8)

## 2024-08-06 LAB — COMPREHENSIVE METABOLIC PANEL WITH GFR
ALT: 89 IU/L — ABNORMAL HIGH (ref 0–44)
AST: 176 IU/L — ABNORMAL HIGH (ref 0–40)
Albumin: 4.7 g/dL (ref 3.9–4.9)
Alkaline Phosphatase: 106 IU/L (ref 47–123)
BUN/Creatinine Ratio: 8 — ABNORMAL LOW (ref 10–24)
BUN: 9 mg/dL (ref 8–27)
Bilirubin Total: 1.1 mg/dL (ref 0.0–1.2)
CO2: 22 mmol/L (ref 20–29)
Calcium: 10 mg/dL (ref 8.6–10.2)
Chloride: 101 mmol/L (ref 96–106)
Creatinine, Ser: 1.11 mg/dL (ref 0.76–1.27)
Globulin, Total: 2.6 g/dL (ref 1.5–4.5)
Glucose: 95 mg/dL (ref 70–99)
Potassium: 4.9 mmol/L (ref 3.5–5.2)
Sodium: 138 mmol/L (ref 134–144)
Total Protein: 7.3 g/dL (ref 6.0–8.5)
eGFR: 74 mL/min/1.73

## 2024-08-06 LAB — PSA: Prostate Specific Ag, Serum: 0.3 ng/mL (ref 0.0–4.0)

## 2024-08-06 LAB — LIPID PANEL W/O CHOL/HDL RATIO
Cholesterol, Total: 101 mg/dL (ref 100–199)
HDL: 53 mg/dL
LDL Chol Calc (NIH): 33 mg/dL (ref 0–99)
Triglycerides: 67 mg/dL (ref 0–149)
VLDL Cholesterol Cal: 15 mg/dL (ref 5–40)

## 2024-08-06 LAB — VITAMIN B12: Vitamin B-12: 601 pg/mL (ref 232–1245)

## 2024-08-06 LAB — TSH: TSH: 2.87 u[IU]/mL (ref 0.450–4.500)

## 2024-08-06 LAB — MAGNESIUM: Magnesium: 2.1 mg/dL (ref 1.6–2.3)

## 2024-08-06 NOTE — Progress Notes (Signed)
 Good morning, please let Mark [name] know his labs have returned and everything is stable with exception of liver function tests which have trended up a significant amount. Are you drinking alcohol regularly? If so cut back on this immediately and I would like to recheck levels in 4 weeks on outpatient labs. Also avoid Tylenol  use. Any questions? Merry Christmas.  -NEEDS LABS VISIT IN 4 weeks please

## 2024-08-18 ENCOUNTER — Ambulatory Visit: Payer: Self-pay

## 2024-08-18 ENCOUNTER — Other Ambulatory Visit: Payer: Self-pay | Admitting: Nurse Practitioner

## 2024-08-18 NOTE — Telephone Encounter (Signed)
 FYI Only or Action Required?: FYI only for provider: appointment scheduled on 08/19/24. Aware of clinic address  Patient was last seen in primary care on 08/05/2024 by Valerio Melanie DASEN, NP.  Called Nurse Triage reporting Constipation.  Symptoms began 3 days ago.  Interventions attempted: OTC medications: Ex Lax X 3 days and Rest, hydration, or home remedies.  Symptoms are: unchanged.  Triage Disposition: See Physician Within 24 Hours  Patient/caregiver understands and will follow disposition?: Yes   Reason for Disposition  Last bowel movement (BM) > 4 days ago  Answer Assessment - Initial Assessment Questions 1. STOOL PATTERN OR FREQUENCY: How often do you have a bowel movement (BM)?  (Normal range: 3 times a day to every 3 days)  When was your last BM?       Daily  2. STRAINING: Do you have to strain to have a BM?      yes 3. ONSET: When did the constipation begin?      3 days  4. RECTAL PAIN: Does your rectum hurt when the stool comes out? If Yes, ask: Do you have hemorrhoids? How bad is the pain?  (Scale 1-10; or mild, moderate, severe)     Denies  5. BM COMPOSITION: Are the stools hard?      firm 6. BLOOD ON STOOLS: Has there been any blood on the toilet tissue or on the surface of the BM? If Yes, ask: When was the last time?     denies 7. CHRONIC CONSTIPATION: Is this a new problem for you?  If No, ask: How long have you had this problem? (days, weeks, months)      3 days of no bm 8. CHANGES IN DIET OR HYDRATION: Have there been any recent changes in your diet? How much fluids are you drinking on a daily basis?  How much have you had to drink today?     Not identified 9. MEDICINES: Have you been taking any new medicines? Are you taking any narcotic pain medicines? (e.g., Dilaudid , morphine , Percocet, Vicodin)      10. LAXATIVES: Have you been using any stool softeners, laxatives, or enemas?  If Yes, ask What are you using, how often, and  when was the last time?       Has taken ex lax daily for 3 days without results 11. ACTIVITY:  How much walking do you do every day?  Has your activity level decreased in the past week?        History of stroke 12. CAUSE: What do you think is causing the constipation?        unsure 13. MEDICAL HISTORY: Do you have a history of hemorrhoids, rectal fissures, rectal surgery, or rectal abscess?          14. OTHER SYMPTOMS: Do you have any other symptoms? (e.g., abdomen pain, bloating, fever, vomiting)      Denies pain, vomiting, fever, and all other symptoms.  Protocols used: Constipation-A-AH Message from Jane P sent at 08/18/2024  3:27 PM EST  Reason for Triage: Constipation issues- Can't poop. No pain. wants to see PCP today.

## 2024-08-19 ENCOUNTER — Ambulatory Visit: Admitting: Student

## 2024-08-19 ENCOUNTER — Encounter: Payer: Self-pay | Admitting: Nurse Practitioner

## 2024-08-19 NOTE — Telephone Encounter (Signed)
 Requested Prescriptions  Refused Prescriptions Disp Refills   gabapentin  (NEURONTIN ) 300 MG capsule [Pharmacy Med Name: GABAPENTIN  300 MG CAPSULE] 360 capsule 4    Sig: TAKE 1 CAPSULE BY MOUTH THREE TIMES A DAY     Neurology: Anticonvulsants - gabapentin  Passed - 08/19/2024 12:04 PM      Passed - Cr in normal range and within 360 days    Creatinine  Date Value Ref Range Status  09/21/2011 0.94 0.60 - 1.30 mg/dL Final   Creatinine, Ser  Date Value Ref Range Status  08/05/2024 1.11 0.76 - 1.27 mg/dL Final         Passed - Completed PHQ-2 or PHQ-9 in the last 360 days      Passed - Valid encounter within last 12 months    Recent Outpatient Visits           2 weeks ago Paraseptal emphysema (HCC)    [facility], Melanie DASEN, NP

## 2024-08-19 NOTE — Telephone Encounter (Addendum)
 Per PAS, pt wanting to cancel 9:20 am appt today because he had a BM. Transfer failed. LVM for pt to call back to PCP office. Placed in call back to confirm that pt does not want to address constipation at this time.

## 2024-09-01 ENCOUNTER — Other Ambulatory Visit

## 2024-09-01 DIAGNOSIS — R7989 Other specified abnormal findings of blood chemistry: Secondary | ICD-10-CM

## 2024-09-02 ENCOUNTER — Ambulatory Visit: Payer: Self-pay | Admitting: Nurse Practitioner

## 2024-09-02 NOTE — Progress Notes (Signed)
 Good morning, please let Cheryl know his liver function returned normal this check. I am waiting on one more labs and if abnormal will let him know. I do recommend minimal alcohol use at home and no other substances. Any questions? Keep being amazing!!  Thank you for allowing me to participate in your care.  I appreciate you. Kindest regards, Angelos Wasco

## 2024-09-03 LAB — HEPATIC FUNCTION PANEL
ALT: 14 IU/L (ref 0–44)
AST: 18 IU/L (ref 0–40)
Albumin: 4.4 g/dL (ref 3.9–4.9)
Alkaline Phosphatase: 80 IU/L (ref 47–123)
Bilirubin Total: <0.2 mg/dL (ref 0.0–1.2)
Bilirubin, Direct: 0.09 mg/dL (ref 0.00–0.40)
Total Protein: 6.7 g/dL (ref 6.0–8.5)

## 2024-09-03 LAB — ETHANOL

## 2024-09-04 ENCOUNTER — Ambulatory Visit

## 2024-09-04 VITALS — Ht 66.0 in | Wt 135.0 lb

## 2024-09-04 DIAGNOSIS — Z0000 Encounter for general adult medical examination without abnormal findings: Secondary | ICD-10-CM | POA: Diagnosis not present

## 2024-09-04 NOTE — Patient Instructions (Signed)
 Mr. Esterly,  Thank you for taking the time for your Medicare Wellness Visit. I appreciate your continued commitment to your health goals. Please review the care plan we discussed, and feel free to reach out if I can assist you further.  Please note that Annual Wellness Visits do not include a physical exam. Some assessments may be limited, especially if the visit was conducted virtually. If needed, we may recommend an in-person follow-up with your provider.  Ongoing Care Seeing your primary care provider every 3 to 6 months helps us  monitor your health and provide consistent, personalized care.   Referrals If a referral was made during today's visit and you haven't received any updates within two weeks, please contact the referred provider directly to check on the status.  Recommended Screenings: You may call [phone] to schedule your lung cancer screening CT scan.   Health Maintenance  Topic Date Due   Screening for Lung Cancer  05/11/2021   Medicare Annual Wellness Visit  07/26/2023   Zoster (Shingles) Vaccine (1 of 2) 10/31/2024*   Flu Shot  11/11/2024*   Pneumococcal Vaccine for age over 49 (1 of 2 - PCV) 08/02/2025*   Colon Cancer Screening  09/07/2030   DTaP/Tdap/Td vaccine (3 - Td or Tdap) 01/17/2033   HPV Vaccine (No Doses Required) Completed   Hepatitis C Screening  Completed   HIV Screening  Completed   Hepatitis B Vaccine  Aged Out   Meningitis B Vaccine  Aged Out   COVID-19 Vaccine  Discontinued  *Topic was postponed. The date shown is not the original due date.       09/04/2024    9:29 AM  Advanced Directives  Does Patient Have a Medical Advance Directive? No  Would patient like information on creating a medical advance directive? No - Patient declined    Vision: Annual vision screenings are recommended for early detection of glaucoma, cataracts, and diabetic retinopathy. These exams can also reveal signs of chronic conditions such as diabetes and high blood  pressure.  Dental: Annual dental screenings help detect early signs of oral cancer, gum disease, and other conditions linked to overall health, including heart disease and diabetes.  Please see the attached documents for additional preventive care recommendations.

## 2024-09-04 NOTE — Progress Notes (Signed)
 "  Chief Complaint  Patient presents with   Medicare Wellness     Subjective:   Mark [name] is a 64 y.o. male who presents for a Medicare Annual Wellness Visit.  Visit info / Clinical Intake: Medicare Wellness Visit Type:: Subsequent Annual Wellness Visit Persons participating in visit and providing information:: patient Medicare Wellness Visit Mode:: Telephone If telephone:: video declined Since this visit was completed virtually, some vitals may be partially provided or unavailable. Missing vitals are due to the limitations of the virtual format.: Documented vitals are patient reported If Telephone or Video please confirm:: I connected with patient using audio/video enable telemedicine. I verified patient identity with two identifiers, discussed telehealth limitations, and patient agreed to proceed. Patient Location:: home Provider Location:: clinic office Interpreter Needed?: No Pre-visit prep was completed: yes AWV questionnaire completed by patient prior to visit?: no Living arrangements:: with family/others Patient's Overall Health Status Rating: very good Typical amount of pain: none Does pain affect daily life?: no Are you currently prescribed opioids?: no  Dietary Habits and Nutritional Risks How many meals a day?: 3 Eats fruit and vegetables daily?: yes Most meals are obtained by: preparing own meals In the last 2 weeks, have you had any of the following?: none Diabetic:: no  Functional Status Activities of Daily Living (to include ambulation/medication): Independent Ambulation: Independent Medication Administration: Needs assistance (comment) (brother helps with managing medications) Is this a change from baseline?: Pre-admission baseline Home Management (perform basic housework or laundry): Independent Manage your own finances?: yes Primary transportation is: family / friends Concerns about vision?: no *vision screening is required for WTM* Concerns about  hearing?: no  Fall Screening Falls in the past year?: 1 Number of falls in past year: 0 Was there an injury with Fall?: 0 Fall Risk Category Calculator: 1 Patient Fall Risk Level: Low Fall Risk  Fall Risk Patient at Risk for Falls Due to: History of fall(s); Impaired balance/gait; Impaired mobility Fall risk Follow up: Falls evaluation completed; Education provided  Home and Transportation Safety: All rugs have non-skid backing?: N/A, no rugs All stairs or steps have railings?: yes Grab bars in the bathtub or shower?: (!) no Have non-skid surface in bathtub or shower?: (!) no Good home lighting?: yes Regular seat belt use?: yes Hospital stays in the last year:: no  Cognitive Assessment Difficulty concentrating, remembering, or making decisions? : no Will 6CIT or Mini Cog be Completed: yes What year is it?: 0 points What month is it?: 0 points Give patient an address phrase to remember (5 components): [address] [place] About what time is it?: 0 points Count backwards from 20 to 1: 4 points Say the months of the year in reverse: 4 points Repeat the address phrase from earlier: 4 points 6 CIT Score: 12 points  Advance Directives (For Healthcare) Does Patient Have a Medical Advance Directive?: No Would patient like information on creating a medical advance directive?: No - Patient declined  Reviewed/Updated  Reviewed/Updated: Reviewed All (Medical, Surgical, Family, Medications, Allergies, Care Teams, Patient Goals)    Allergies (verified) Codeine   Current Medications (verified) Outpatient Encounter Medications as of 09/04/2024  Medication Sig   amLODipine  (NORVASC ) 2.5 MG tablet Take 1 tablet (2.5 mg total) by mouth daily.   aspirin  EC 81 MG tablet Take 1 tablet (81 mg total) by mouth daily. Swallow whole.   atorvastatin  (LIPITOR) 80 MG tablet Take 1 tablet (80 mg total) by mouth daily.   citalopram  (CELEXA ) 20 MG tablet  Take 1 tablet (20 mg total) by mouth  daily.   cyanocobalamin  1000 MCG tablet Take 1 tablet (1,000 mcg total) by mouth daily.   ferrous sulfate  325 (65 FE) MG EC tablet TAKE 1 TABLET BY MOUTH EVERY DAY WITH BREAKFAST   gabapentin  (NEURONTIN ) 300 MG capsule Take 1 capsule (300 mg total) by mouth 3 (three) times daily.   Melatonin 10 MG TABS Take 10 mg by mouth at bedtime.   metoprolol  tartrate (LOPRESSOR ) 25 MG tablet Take 1 tablet (25 mg total) by mouth 2 (two) times daily. Will need a visit for further refills   pantoprazole  (PROTONIX ) 40 MG tablet Take 1 tablet (40 mg total) by mouth daily.   tamsulosin  (FLOMAX ) 0.4 MG CAPS capsule Take 1 capsule (0.4 mg total) by mouth daily.   [employer] D3 25 MCG (1000 UT) capsule TAKE 2 CAPSULES (2,000 UNITS TOTAL) BY MOUTH DAILY. (Patient not taking: Reported on 09/04/2024)   No facility-administered encounter medications on file as of 09/04/2024.    History: Past Medical History:  Diagnosis Date   Depression    History of kidney stones    Hypertension    Stroke [facility])    Past Surgical History:  Procedure Laterality Date   APPENDECTOMY     BIOPSY  06/05/2023   Procedure: BIOPSY;  Surgeon: Jinny Carmine, MD;  Location: [facility] ENDOSCOPY;  Service: Endoscopy;;   bowel obstruction     COLON SURGERY     COLONOSCOPY N/A 09/07/2020   Procedure: COLONOSCOPY;  Surgeon: Janalyn Keene NOVAK, MD;  Location: [facility] ENDOSCOPY;  Service: Endoscopy;  Laterality: N/A;   ENDOSCOPIC RETROGRADE CHOLANGIOPANCREATOGRAPHY (ERCP) WITH PROPOFOL  N/A 06/05/2023   Procedure: ENDOSCOPIC RETROGRADE CHOLANGIOPANCREATOGRAPHY (ERCP) WITH PROPOFOL ;  Surgeon: Jinny Carmine, MD;  Location: [facility] ENDOSCOPY;  Service: Endoscopy;  Laterality: N/A;   ESOPHAGOGASTRODUODENOSCOPY (EGD) WITH PROPOFOL  N/A 09/03/2020   Procedure: ESOPHAGOGASTRODUODENOSCOPY (EGD) WITH PROPOFOL ;  Surgeon: Unk Corinn Skiff, MD;  Location: [facility] ENDOSCOPY;  Service: Gastroenterology;  Laterality: N/A;   EXTRACORPOREAL SHOCK WAVE LITHOTRIPSY Right 05/15/2019    Procedure: EXTRACORPOREAL SHOCK WAVE LITHOTRIPSY (ESWL);  Surgeon: Sherrilee Belvie CROME, MD;  Location: [facility];  Service: Urology;  Laterality: Right;   SPHINCTEROTOMY  06/05/2023   Procedure: SPHINCTEROTOMY;  Surgeon: Jinny Carmine, MD;  Location: [facility] ENDOSCOPY;  Service: Endoscopy;;   Family History  Problem Relation Age of Onset   Heart disease Father    Seizures Brother    Social History   Occupational History   Occupation: retired  Tobacco Use   Smoking status: Every Day    Current packs/day: 1.00    Average packs/day: 1 pack/day for 49.1 years (49.1 ttl pk-yrs)    Types: Cigarettes    Start date: 1977   Smokeless tobacco: Never  Vaping Use   Vaping status: Never Used  Substance and Sexual Activity   Alcohol use: Not Currently    Alcohol/week: 0.0 standard drinks of alcohol   Drug use: Not Currently   Sexual activity: Not Currently   Tobacco Counseling Ready to quit: Not Answered Counseling given: Not Answered  SDOH Screenings   Food Insecurity: No Food Insecurity (09/04/2024)  Housing: Low Risk (09/04/2024)  Transportation Needs: No Transportation Needs (09/04/2024)  Utilities: Not At Risk (09/04/2024)  Alcohol Screen: Low Risk (07/25/2022)  Depression (PHQ2-9): Low Risk (09/04/2024)  Financial Resource Strain: Low Risk (07/25/2022)  Physical Activity: Inactive (09/04/2024)  Social Connections: Moderately Isolated (09/04/2024)  Stress: No Stress Concern Present (09/04/2024)  Tobacco Use: High Risk (09/04/2024)  Health Literacy: Adequate Health  Literacy (09/04/2024)   See flowsheets for full screening details  Depression Screen PHQ 2 & 9 Depression Scale- Over the past 2 weeks, how often have you been bothered by any of the following problems? Little interest or pleasure in doing things: 0 Feeling down, depressed, or hopeless (PHQ Adolescent also includes...irritable): 0 PHQ-2 Total Score: 0 Trouble falling or staying asleep, or sleeping too much: 0 Feeling tired  or having little energy: 0 Poor appetite or overeating (PHQ Adolescent also includes...weight loss): 0 Feeling bad about yourself - or that you are a failure or have let yourself or your family down: 0 Trouble concentrating on things, such as reading the newspaper or watching television (PHQ Adolescent also includes...like school work): 0 Moving or speaking so slowly that other people could have noticed. Or the opposite - being so fidgety or restless that you have been moving around a lot more than usual: 0 Thoughts that you would be better off dead, or of hurting yourself in some way: 0 PHQ-9 Total Score: 0 If you checked off any problems, how difficult have these problems made it for you to do your work, take care of things at home, or get along with other people?: Not difficult at all  Depression Treatment Depression Interventions/Treatment : [id number] Score <4 Follow-up Not Indicated     Goals Addressed               This Visit's Progress     Maintain current health (pt-stated)               Objective:    Today's Vitals   09/04/24 0923  Weight: 135 lb (61.2 kg)  Height: 5' 6 (1.676 m)   Body mass index is 21.79 kg/m.  Hearing/Vision screen Hearing Screening - Comments:: Denies hearing loss  Vision Screening - Comments:: UTD w/ eye exam per patient. Does not remember where he had the exam done. Immunizations and Health Maintenance Health Maintenance  Topic Date Due   Lung Cancer Screening  05/11/2021   Zoster Vaccines- Shingrix (1 of 2) 10/31/2024 (Originally 04/27/2010)   Influenza Vaccine  11/11/2024 (Originally 03/14/2024)   Pneumococcal Vaccine: 50+ Years (1 of 2 - PCV) 08/02/2025 (Originally 04/28/1979)   Medicare Annual Wellness (AWV)  09/04/2025   Colonoscopy  09/07/2030   DTaP/Tdap/Td (3 - Td or Tdap) 01/17/2033   HPV VACCINES (No Doses Required) Completed   Hepatitis C Screening  Completed   HIV Screening  Completed   Hepatitis B Vaccines 19-59 Average  Risk  Aged Out   Meningococcal B Vaccine  Aged Out   COVID-19 Vaccine  Discontinued        Assessment/Plan:  This is a routine wellness examination for Mark [name].  Patient Care Team: Cannady, Jolene T, NP as PCP - General (Nurse Practitioner) Ezzard Rolin BIRCH, LCSW as Social Worker (Licensed Clinical Social Worker)  I have personally reviewed and noted the following in the patients chart:   Medical and social history Use of alcohol, tobacco or illicit drugs  Current medications and supplements including opioid prescriptions. Functional ability and status Nutritional status Physical activity Advanced directives List of other physicians Hospitalizations, surgeries, and ER visits in previous 12 months Vitals Screenings to include cognitive, depression, and falls Referrals and appointments  No orders of the defined types were placed in this encounter.  In addition, I have reviewed and discussed with patient certain preventive protocols, quality metrics, and best practice recommendations. A written personalized care plan for preventive services as well as  general preventive health recommendations were provided to patient.   Vina Ned, CMA   09/04/2024   Return in 53 weeks (on 09/10/2025) for Medicare Annual Wellness Visit.  After Visit Summary: (Mail) Due to this being a telephonic visit, the after visit summary with patients personalized plan was offered to patient via mail   Nurse Notes:  6 CIT Score - 12 Gave ph# to call and schedule LDCT Declined flu, covid, shingles and pneumonia vaccines Declined colonoscopy "

## 2024-09-11 ENCOUNTER — Encounter: Payer: Self-pay | Admitting: Emergency Medicine

## 2024-09-11 NOTE — Progress Notes (Signed)
 Noted, thank you.

## 2024-09-15 ENCOUNTER — Other Ambulatory Visit: Payer: Self-pay | Admitting: Nurse Practitioner

## 2024-09-15 DIAGNOSIS — E785 Hyperlipidemia, unspecified: Secondary | ICD-10-CM

## 2025-02-03 ENCOUNTER — Ambulatory Visit: Admitting: Nurse Practitioner

## 2025-09-10 ENCOUNTER — Ambulatory Visit
